# Patient Record
Sex: Female | Born: 1975
Health system: Southern US, Community
[De-identification: ages and names within clinical notes are randomized; demographics above are authoritative.]

## PROBLEM LIST (undated history)

## (undated) DIAGNOSIS — R062 Wheezing: Secondary | ICD-10-CM

## (undated) DIAGNOSIS — R05 Cough: Secondary | ICD-10-CM

## (undated) DIAGNOSIS — G510 Bell's palsy: Secondary | ICD-10-CM

## (undated) DIAGNOSIS — F329 Major depressive disorder, single episode, unspecified: Secondary | ICD-10-CM

## (undated) DIAGNOSIS — R059 Cough, unspecified: Secondary | ICD-10-CM

## (undated) DIAGNOSIS — R5383 Other fatigue: Secondary | ICD-10-CM

## (undated) DIAGNOSIS — E669 Obesity, unspecified: Secondary | ICD-10-CM

## (undated) DIAGNOSIS — M7989 Other specified soft tissue disorders: Secondary | ICD-10-CM

## (undated) DIAGNOSIS — M2550 Pain in unspecified joint: Secondary | ICD-10-CM

## (undated) DIAGNOSIS — R209 Unspecified disturbances of skin sensation: Secondary | ICD-10-CM

## (undated) DIAGNOSIS — R079 Chest pain, unspecified: Secondary | ICD-10-CM

## (undated) DIAGNOSIS — Z87898 Personal history of other specified conditions: Secondary | ICD-10-CM

## (undated) DIAGNOSIS — F32A Depression, unspecified: Secondary | ICD-10-CM

## (undated) DIAGNOSIS — G5602 Carpal tunnel syndrome, left upper limb: Secondary | ICD-10-CM

## (undated) DIAGNOSIS — F439 Reaction to severe stress, unspecified: Secondary | ICD-10-CM

## (undated) DIAGNOSIS — I10 Essential (primary) hypertension: Secondary | ICD-10-CM

## (undated) DIAGNOSIS — R0602 Shortness of breath: Secondary | ICD-10-CM

## (undated) DIAGNOSIS — E282 Polycystic ovarian syndrome: Secondary | ICD-10-CM

## (undated) DIAGNOSIS — G4733 Obstructive sleep apnea (adult) (pediatric): Secondary | ICD-10-CM

## (undated) DIAGNOSIS — F419 Anxiety disorder, unspecified: Secondary | ICD-10-CM

## (undated) DIAGNOSIS — M549 Dorsalgia, unspecified: Secondary | ICD-10-CM

## (undated) DIAGNOSIS — R682 Dry mouth, unspecified: Secondary | ICD-10-CM

## (undated) DIAGNOSIS — E0590 Thyrotoxicosis, unspecified without thyrotoxic crisis or storm: Secondary | ICD-10-CM

## (undated) DIAGNOSIS — L853 Xerosis cutis: Secondary | ICD-10-CM

## (undated) DIAGNOSIS — K219 Gastro-esophageal reflux disease without esophagitis: Secondary | ICD-10-CM

## (undated) HISTORY — DX: Obesity, unspecified: E66.9

## (undated) HISTORY — DX: Dorsalgia, unspecified: M54.9

## (undated) HISTORY — DX: Depression, unspecified: F32.A

## (undated) HISTORY — DX: Pain in unspecified joint: M25.50

## (undated) HISTORY — DX: Essential (primary) hypertension: I10

## (undated) HISTORY — DX: Reaction to severe stress, unspecified: F43.9

## (undated) HISTORY — DX: Polycystic ovarian syndrome: E28.2

## (undated) HISTORY — DX: Anxiety disorder, unspecified: F41.9

## (undated) HISTORY — DX: Other fatigue: R53.83

## (undated) HISTORY — DX: Wheezing: R06.2

## (undated) HISTORY — DX: Unspecified disturbances of skin sensation: R20.9

## (undated) HISTORY — PX: PANNICULECTOMY: SUR1001

## (undated) HISTORY — DX: Gastro-esophageal reflux disease without esophagitis: K21.9

## (undated) HISTORY — DX: Obstructive sleep apnea (adult) (pediatric): G47.33

## (undated) HISTORY — DX: Chest pain, unspecified: R07.9

## (undated) HISTORY — DX: Dry mouth, unspecified: R68.2

## (undated) HISTORY — DX: Cough, unspecified: R05.9

## (undated) HISTORY — DX: Shortness of breath: R06.02

## (undated) HISTORY — DX: Thyrotoxicosis, unspecified without thyrotoxic crisis or storm: E05.90

## (undated) HISTORY — DX: Other specified soft tissue disorders: M79.89

## (undated) HISTORY — DX: Major depressive disorder, single episode, unspecified: F32.9

## (undated) HISTORY — DX: Cough: R05

## (undated) HISTORY — DX: Xerosis cutis: L85.3

## (undated) HISTORY — DX: Bell's palsy: G51.0

---
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
## 1998-03-22 ENCOUNTER — Emergency Department (HOSPITAL_COMMUNITY): Admission: EM | Admit: 1998-03-22 | Discharge: 1998-03-22 | Payer: Self-pay | Admitting: Emergency Medicine

## 1998-10-24 ENCOUNTER — Inpatient Hospital Stay (HOSPITAL_COMMUNITY): Admission: AD | Admit: 1998-10-24 | Discharge: 1998-10-24 | Payer: Self-pay | Admitting: Obstetrics

## 1998-11-21 ENCOUNTER — Inpatient Hospital Stay (HOSPITAL_COMMUNITY): Admission: AD | Admit: 1998-11-21 | Discharge: 1998-11-21 | Payer: Self-pay | Admitting: Obstetrics & Gynecology

## 1998-11-21 ENCOUNTER — Encounter: Payer: Self-pay | Admitting: Obstetrics & Gynecology

## 1998-11-23 ENCOUNTER — Ambulatory Visit (HOSPITAL_COMMUNITY): Admission: RE | Admit: 1998-11-23 | Discharge: 1998-11-23 | Payer: Self-pay | Admitting: Obstetrics & Gynecology

## 1999-06-27 ENCOUNTER — Encounter: Payer: Self-pay | Admitting: Emergency Medicine

## 1999-06-27 ENCOUNTER — Emergency Department (HOSPITAL_COMMUNITY): Admission: EM | Admit: 1999-06-27 | Discharge: 1999-06-27 | Payer: Self-pay | Admitting: Emergency Medicine

## 1999-11-26 ENCOUNTER — Ambulatory Visit (HOSPITAL_COMMUNITY): Admission: RE | Admit: 1999-11-26 | Discharge: 1999-11-26 | Payer: Self-pay | Admitting: Neurosurgery

## 1999-11-26 ENCOUNTER — Encounter: Payer: Self-pay | Admitting: Neurosurgery

## 2000-09-09 ENCOUNTER — Other Ambulatory Visit: Admission: RE | Admit: 2000-09-09 | Discharge: 2000-09-09 | Payer: Self-pay | Admitting: Obstetrics and Gynecology

## 2000-10-14 ENCOUNTER — Encounter: Payer: Self-pay | Admitting: Neurosurgery

## 2000-10-14 ENCOUNTER — Ambulatory Visit (HOSPITAL_COMMUNITY): Admission: RE | Admit: 2000-10-14 | Discharge: 2000-10-14 | Payer: Self-pay | Admitting: Neurosurgery

## 2001-10-03 ENCOUNTER — Other Ambulatory Visit: Admission: RE | Admit: 2001-10-03 | Discharge: 2001-10-03 | Payer: Self-pay | Admitting: Obstetrics and Gynecology

## 2002-03-20 ENCOUNTER — Other Ambulatory Visit: Admission: RE | Admit: 2002-03-20 | Discharge: 2002-03-20 | Payer: Self-pay | Admitting: Obstetrics and Gynecology

## 2002-06-09 ENCOUNTER — Emergency Department (HOSPITAL_COMMUNITY): Admission: EM | Admit: 2002-06-09 | Discharge: 2002-06-09 | Payer: Self-pay | Admitting: Emergency Medicine

## 2002-09-21 ENCOUNTER — Encounter (INDEPENDENT_AMBULATORY_CARE_PROVIDER_SITE_OTHER): Payer: Self-pay | Admitting: *Deleted

## 2002-09-21 ENCOUNTER — Ambulatory Visit (HOSPITAL_BASED_OUTPATIENT_CLINIC_OR_DEPARTMENT_OTHER): Admission: RE | Admit: 2002-09-21 | Discharge: 2002-09-21 | Payer: Self-pay | Admitting: Otolaryngology

## 2002-09-21 HISTORY — PX: TONSILLECTOMY: SUR1361

## 2002-10-27 ENCOUNTER — Other Ambulatory Visit: Admission: RE | Admit: 2002-10-27 | Discharge: 2002-10-27 | Payer: Self-pay | Admitting: Obstetrics and Gynecology

## 2003-11-25 ENCOUNTER — Other Ambulatory Visit: Admission: RE | Admit: 2003-11-25 | Discharge: 2003-11-25 | Payer: Self-pay | Admitting: Obstetrics and Gynecology

## 2004-01-07 ENCOUNTER — Encounter (INDEPENDENT_AMBULATORY_CARE_PROVIDER_SITE_OTHER): Payer: Self-pay | Admitting: Specialist

## 2004-01-07 ENCOUNTER — Ambulatory Visit (HOSPITAL_COMMUNITY): Admission: RE | Admit: 2004-01-07 | Discharge: 2004-01-07 | Payer: Self-pay | Admitting: Obstetrics and Gynecology

## 2004-01-07 HISTORY — PX: LEEP: SHX91

## 2004-07-19 ENCOUNTER — Other Ambulatory Visit: Admission: RE | Admit: 2004-07-19 | Discharge: 2004-07-19 | Payer: Self-pay | Admitting: Obstetrics and Gynecology

## 2005-01-10 ENCOUNTER — Other Ambulatory Visit: Admission: RE | Admit: 2005-01-10 | Discharge: 2005-01-10 | Payer: Self-pay | Admitting: Obstetrics and Gynecology

## 2005-01-31 ENCOUNTER — Ambulatory Visit: Payer: Self-pay | Admitting: Internal Medicine

## 2005-02-16 ENCOUNTER — Ambulatory Visit: Payer: Self-pay | Admitting: Internal Medicine

## 2005-07-30 ENCOUNTER — Other Ambulatory Visit: Admission: RE | Admit: 2005-07-30 | Discharge: 2005-07-30 | Payer: Self-pay | Admitting: Obstetrics and Gynecology

## 2005-11-12 ENCOUNTER — Ambulatory Visit: Payer: Self-pay | Admitting: Internal Medicine

## 2006-02-21 ENCOUNTER — Encounter (INDEPENDENT_AMBULATORY_CARE_PROVIDER_SITE_OTHER): Payer: Self-pay | Admitting: *Deleted

## 2006-02-21 ENCOUNTER — Ambulatory Visit (HOSPITAL_COMMUNITY): Admission: RE | Admit: 2006-02-21 | Discharge: 2006-02-21 | Payer: Self-pay | Admitting: Obstetrics and Gynecology

## 2006-02-21 HISTORY — PX: HYSTEROSCOPY WITH D & C: SHX1775

## 2006-07-31 ENCOUNTER — Ambulatory Visit: Payer: Self-pay | Admitting: Internal Medicine

## 2006-10-08 ENCOUNTER — Ambulatory Visit: Payer: Self-pay | Admitting: Endocrinology

## 2006-10-08 LAB — CONVERTED CEMR LAB
ALT: 21 units/L (ref 0–40)
AST: 21 units/L (ref 0–37)
Albumin: 3.6 g/dL (ref 3.5–5.2)
Alkaline Phosphatase: 71 units/L (ref 39–117)
Amylase: 51 units/L (ref 27–131)
BUN: 5 mg/dL — ABNORMAL LOW (ref 6–23)
Bilirubin, Direct: 0.1 mg/dL (ref 0.0–0.3)
CO2: 29 meq/L (ref 19–32)
Calcium: 9.4 mg/dL (ref 8.4–10.5)
Chloride: 104 meq/L (ref 96–112)
Creatinine, Ser: 0.8 mg/dL (ref 0.4–1.2)
GFR calc non Af Amer: 91 mL/min
Glomerular Filtration Rate, Af Am: 110 mL/min/{1.73_m2}
Glucose, Bld: 83 mg/dL (ref 70–99)
HCT: 40.2 % (ref 36.0–46.0)
Hemoglobin: 13.6 g/dL (ref 12.0–15.0)
Hgb A1c MFr Bld: 5.6 % (ref 4.6–6.0)
MCHC: 33.8 g/dL (ref 30.0–36.0)
MCV: 86.4 fL (ref 78.0–100.0)
Platelets: 332 10*3/uL (ref 150–400)
Potassium: 3.4 meq/L — ABNORMAL LOW (ref 3.5–5.1)
RBC: 4.65 M/uL (ref 3.87–5.11)
RDW: 14 % (ref 11.5–14.6)
Sodium: 139 meq/L (ref 135–145)
TSH: 1.53 microintl units/mL (ref 0.35–5.50)
Total Bilirubin: 0.7 mg/dL (ref 0.3–1.2)
Total Protein: 7.4 g/dL (ref 6.0–8.3)
WBC: 7.1 10*3/uL (ref 4.5–10.5)

## 2006-10-09 ENCOUNTER — Emergency Department (HOSPITAL_COMMUNITY): Admission: EM | Admit: 2006-10-09 | Discharge: 2006-10-09 | Payer: Self-pay | Admitting: Family Medicine

## 2007-04-11 ENCOUNTER — Ambulatory Visit: Payer: Self-pay | Admitting: Internal Medicine

## 2007-09-08 ENCOUNTER — Encounter: Payer: Self-pay | Admitting: *Deleted

## 2007-09-08 DIAGNOSIS — R739 Hyperglycemia, unspecified: Secondary | ICD-10-CM

## 2007-09-08 DIAGNOSIS — I10 Essential (primary) hypertension: Secondary | ICD-10-CM

## 2008-01-26 ENCOUNTER — Emergency Department (HOSPITAL_COMMUNITY): Admission: EM | Admit: 2008-01-26 | Discharge: 2008-01-26 | Payer: Self-pay | Admitting: Emergency Medicine

## 2008-02-19 ENCOUNTER — Telehealth: Payer: Self-pay | Admitting: Internal Medicine

## 2008-04-19 ENCOUNTER — Encounter: Payer: Self-pay | Admitting: Internal Medicine

## 2008-07-07 ENCOUNTER — Ambulatory Visit: Payer: Self-pay | Admitting: Internal Medicine

## 2008-07-07 DIAGNOSIS — J45909 Unspecified asthma, uncomplicated: Secondary | ICD-10-CM | POA: Insufficient documentation

## 2008-07-07 DIAGNOSIS — N912 Amenorrhea, unspecified: Secondary | ICD-10-CM

## 2008-07-08 ENCOUNTER — Telehealth: Payer: Self-pay | Admitting: Internal Medicine

## 2008-10-05 ENCOUNTER — Ambulatory Visit: Payer: Self-pay | Admitting: Internal Medicine

## 2008-11-29 LAB — CONVERTED CEMR LAB
BUN: 6 mg/dL (ref 6–23)
CO2: 29 meq/L (ref 19–32)
Calcium: 8.8 mg/dL (ref 8.4–10.5)
Chloride: 106 meq/L (ref 96–112)
Creatinine, Ser: 0.7 mg/dL (ref 0.4–1.2)
GFR calc Af Amer: 126 mL/min
GFR calc non Af Amer: 104 mL/min
Glucose, Bld: 86 mg/dL (ref 70–99)
Hgb A1c MFr Bld: 5.7 % (ref 4.6–6.0)
Potassium: 3.7 meq/L (ref 3.5–5.1)
Sodium: 141 meq/L (ref 135–145)
TSH: 1.8 microintl units/mL (ref 0.35–5.50)

## 2009-05-12 ENCOUNTER — Encounter (INDEPENDENT_AMBULATORY_CARE_PROVIDER_SITE_OTHER): Payer: Self-pay | Admitting: *Deleted

## 2009-05-12 ENCOUNTER — Ambulatory Visit: Payer: Self-pay | Admitting: Internal Medicine

## 2009-05-12 DIAGNOSIS — R0989 Other specified symptoms and signs involving the circulatory and respiratory systems: Secondary | ICD-10-CM

## 2009-05-12 DIAGNOSIS — R079 Chest pain, unspecified: Secondary | ICD-10-CM | POA: Insufficient documentation

## 2009-05-12 DIAGNOSIS — R609 Edema, unspecified: Secondary | ICD-10-CM

## 2009-05-12 DIAGNOSIS — R0609 Other forms of dyspnea: Secondary | ICD-10-CM

## 2009-05-12 LAB — CONVERTED CEMR LAB
BUN: 8 mg/dL (ref 6–23)
Basophils Absolute: 0 10*3/uL (ref 0.0–0.1)
Basophils Relative: 0.3 % (ref 0.0–3.0)
CO2: 29 meq/L (ref 19–32)
Calcium: 9.2 mg/dL (ref 8.4–10.5)
Chloride: 104 meq/L (ref 96–112)
Creatinine, Ser: 0.6 mg/dL (ref 0.4–1.2)
Eosinophils Absolute: 0.1 10*3/uL (ref 0.0–0.7)
Eosinophils Relative: 2 % (ref 0.0–5.0)
GFR calc non Af Amer: 150.04 mL/min (ref >60)
Glucose, Bld: 78 mg/dL (ref 70–99)
HCT: 38.4 % (ref 36.0–46.0)
Hemoglobin: 13.3 g/dL (ref 12.0–15.0)
Lymphocytes Relative: 37.8 % (ref 12.0–46.0)
Lymphs Abs: 2.3 10*3/uL (ref 0.7–4.0)
MCHC: 34.6 g/dL (ref 30.0–36.0)
MCV: 85.8 fL (ref 78.0–100.0)
Monocytes Absolute: 0.4 10*3/uL (ref 0.1–1.0)
Monocytes Relative: 5.8 % (ref 3.0–12.0)
Neutro Abs: 3.3 10*3/uL (ref 1.4–7.7)
Neutrophils Relative %: 54.1 % (ref 43.0–77.0)
Platelets: 274 10*3/uL (ref 150.0–400.0)
Potassium: 3.7 meq/L (ref 3.5–5.1)
Pro B Natriuretic peptide (BNP): 22 pg/mL (ref 0.0–100.0)
RBC: 4.48 M/uL (ref 3.87–5.11)
RDW: 14.5 % (ref 11.5–14.6)
Sodium: 139 meq/L (ref 135–145)
TSH: 1.5 microintl units/mL (ref 0.35–5.50)
WBC: 6.1 10*3/uL (ref 4.5–10.5)
hCG, Beta Chain, Quant, S: <0.5 milliintl units/mL

## 2009-05-23 ENCOUNTER — Ambulatory Visit: Payer: Self-pay | Admitting: Internal Medicine

## 2009-06-06 ENCOUNTER — Telehealth: Payer: Self-pay | Admitting: Internal Medicine

## 2009-08-11 ENCOUNTER — Ambulatory Visit: Payer: Self-pay | Admitting: Internal Medicine

## 2009-08-22 ENCOUNTER — Ambulatory Visit: Payer: Self-pay | Admitting: Internal Medicine

## 2009-10-19 ENCOUNTER — Emergency Department (HOSPITAL_COMMUNITY): Admission: EM | Admit: 2009-10-19 | Discharge: 2009-10-20 | Payer: Self-pay | Admitting: Emergency Medicine

## 2009-12-06 ENCOUNTER — Ambulatory Visit: Payer: Self-pay | Admitting: Internal Medicine

## 2009-12-06 DIAGNOSIS — F329 Major depressive disorder, single episode, unspecified: Secondary | ICD-10-CM

## 2009-12-06 DIAGNOSIS — F32A Depression, unspecified: Secondary | ICD-10-CM | POA: Insufficient documentation

## 2010-05-10 ENCOUNTER — Ambulatory Visit: Payer: Self-pay | Admitting: Internal Medicine

## 2010-05-10 DIAGNOSIS — J069 Acute upper respiratory infection, unspecified: Secondary | ICD-10-CM | POA: Insufficient documentation

## 2010-05-10 DIAGNOSIS — H6690 Otitis media, unspecified, unspecified ear: Secondary | ICD-10-CM | POA: Insufficient documentation

## 2010-05-24 ENCOUNTER — Ambulatory Visit: Payer: Self-pay | Admitting: Internal Medicine

## 2010-05-24 DIAGNOSIS — R05 Cough: Secondary | ICD-10-CM

## 2010-05-25 LAB — CONVERTED CEMR LAB
Alkaline Phosphatase: 69 units/L (ref 39–117)
BUN: 6 mg/dL (ref 6–23)
Basophils Relative: 0.4 % (ref 0.0–3.0)
Bilirubin Urine: NEGATIVE
Bilirubin, Direct: 0.1 mg/dL (ref 0.0–0.3)
Calcium: 9 mg/dL (ref 8.4–10.5)
Chloride: 103 meq/L (ref 96–112)
Creatinine, Ser: 0.7 mg/dL (ref 0.4–1.2)
Eosinophils Absolute: 0.2 10*3/uL (ref 0.0–0.7)
Eosinophils Relative: 2.2 % (ref 0.0–5.0)
HDL: 54.4 mg/dL (ref >39.00)
Hgb A1c MFr Bld: 5.9 % (ref 4.6–6.5)
Ketones, ur: NEGATIVE mg/dL
Leukocytes, UA: NEGATIVE
Lymphocytes Relative: 38.3 % (ref 12.0–46.0)
MCHC: 34.3 g/dL (ref 30.0–36.0)
MCV: 87.6 fL (ref 78.0–100.0)
Monocytes Absolute: 0.4 10*3/uL (ref 0.1–1.0)
Neutrophils Relative %: 53.8 % (ref 43.0–77.0)
Platelets: 288 10*3/uL (ref 150.0–400.0)
RBC: 4.51 M/uL (ref 3.87–5.11)
TSH: 1.77 microintl units/mL (ref 0.35–5.50)
Total Bilirubin: 0.5 mg/dL (ref 0.3–1.2)
Total CHOL/HDL Ratio: 4
Triglycerides: 72 mg/dL (ref 0.0–149.0)
Urine Glucose: NEGATIVE mg/dL
Urobilinogen, UA: 0.2 (ref 0.0–1.0)
WBC: 7.1 10*3/uL (ref 4.5–10.5)

## 2010-06-30 ENCOUNTER — Telehealth: Payer: Self-pay | Admitting: Internal Medicine

## 2010-07-04 ENCOUNTER — Ambulatory Visit: Payer: Self-pay | Admitting: Internal Medicine

## 2010-07-18 ENCOUNTER — Ambulatory Visit: Payer: Self-pay | Admitting: Internal Medicine

## 2010-09-05 IMAGING — CT CT ANGIO CHEST
3 of 7 series · 19 of 36 positions shown · IV contrast (Omnipaque 300)
Comparison: None

CLINICAL DATA: Shortness of breath, chest pain.

CT ANGIOGRAPHY CHEST WITH CONTRAST
TECHNIQUE: Multidetector CT imaging of the chest was performed
using the standard protocol during bolus administration of
intravenous contrast. Multiplanar CT image reconstructions
including MIPs were obtained to evaluate the vascular anatomy.
Contrast: 80 ml Omnipaque 350.

[Series 5: pe thins · axial · 0.75mm/px · z∈[-327,-120]mm · 15 of 237 slices shown]
[im 15/237  lung]
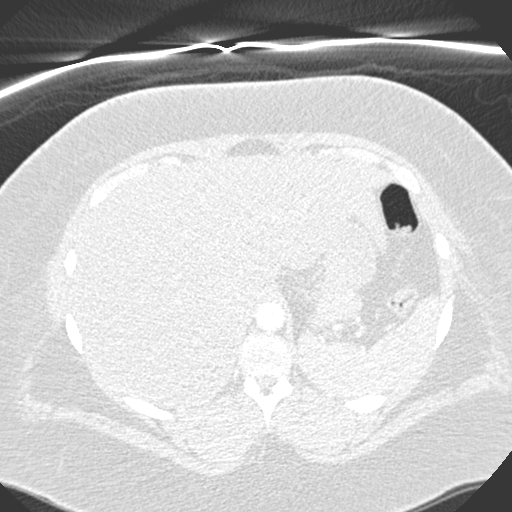
[im 30/237  mediastinal]
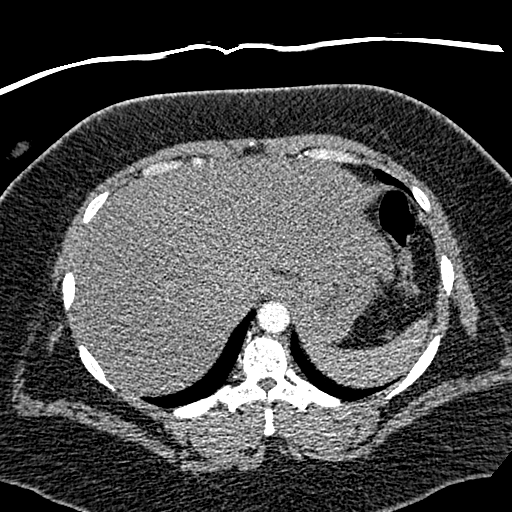
[im 45/237  lung]
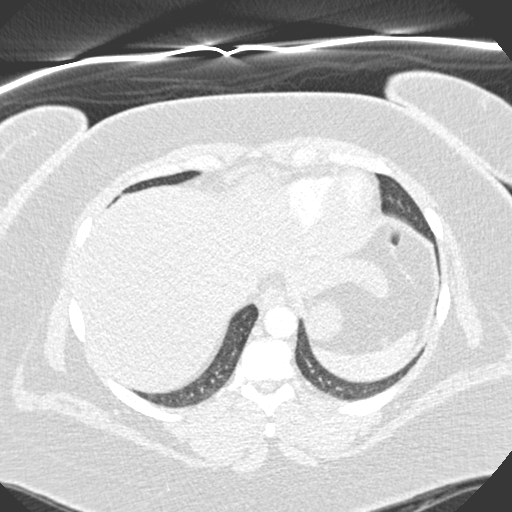
[im 60/237  mediastinal]
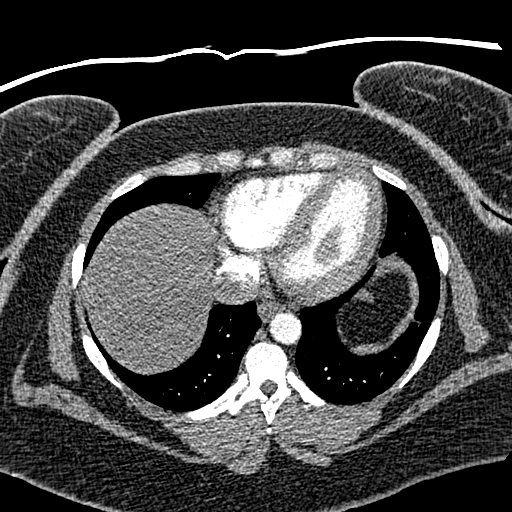
[im 74/237  lung]
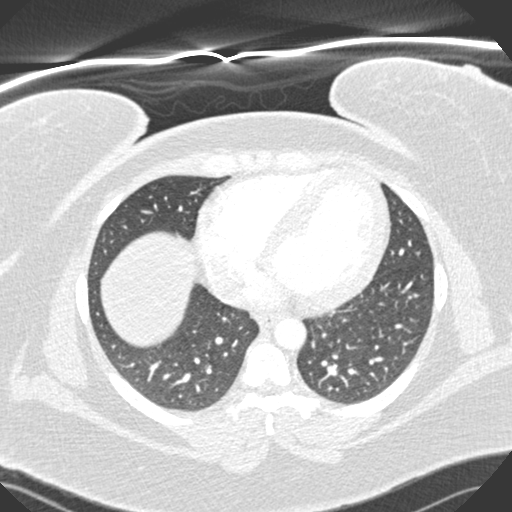
[im 89/237  mediastinal]
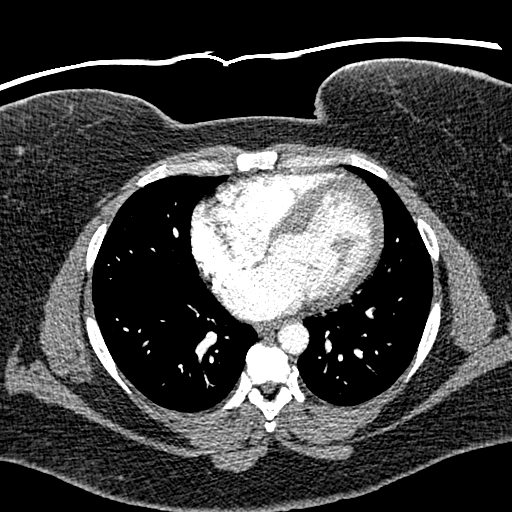
[im 104/237  lung]
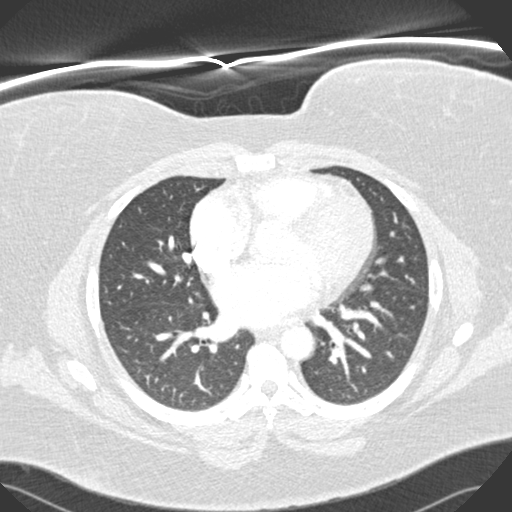
[im 119/237  mediastinal]
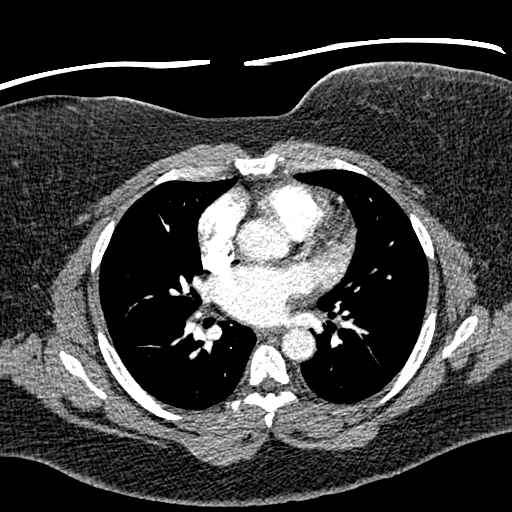
[im 133/237  lung]
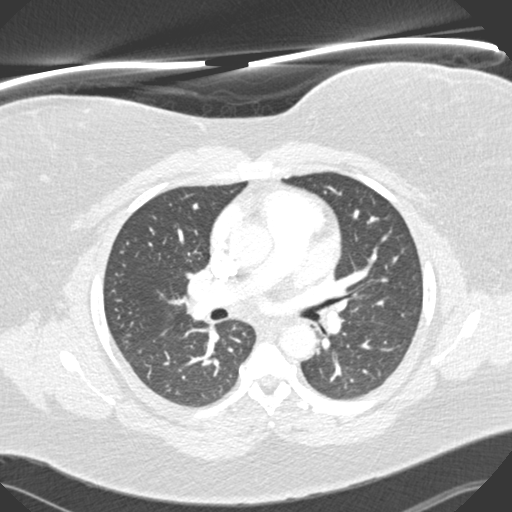
[im 148/237  mediastinal]
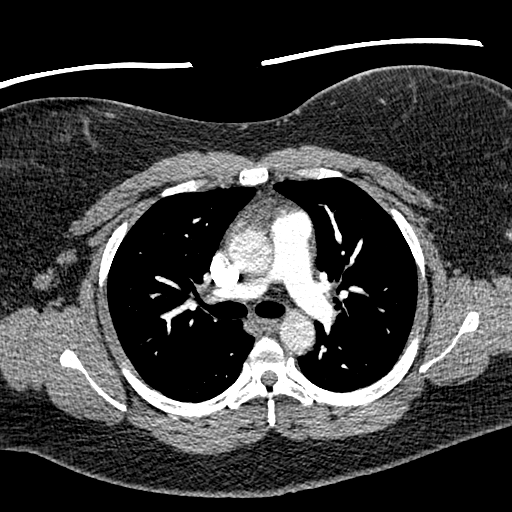
[im 163/237  lung]
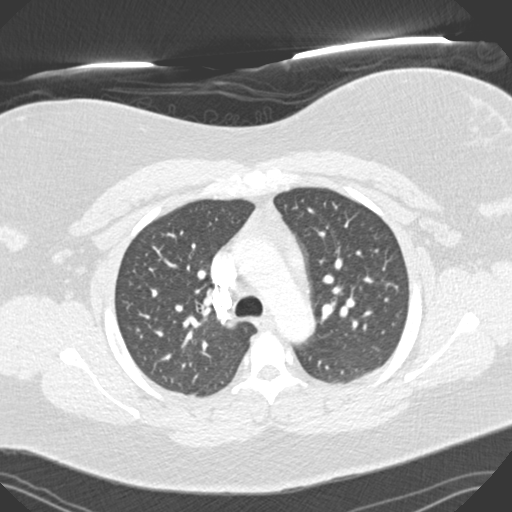
[im 178/237  mediastinal]
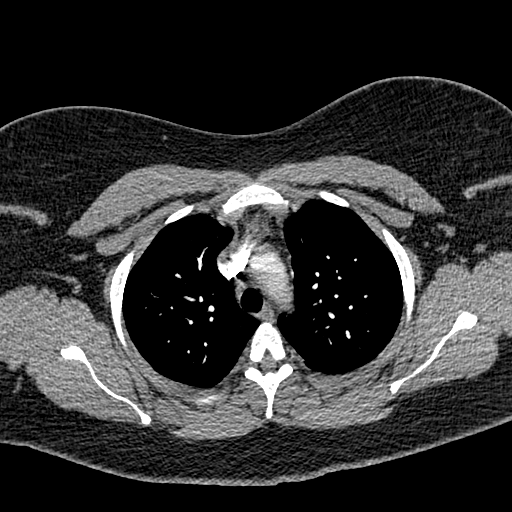
[im 192/237  lung]
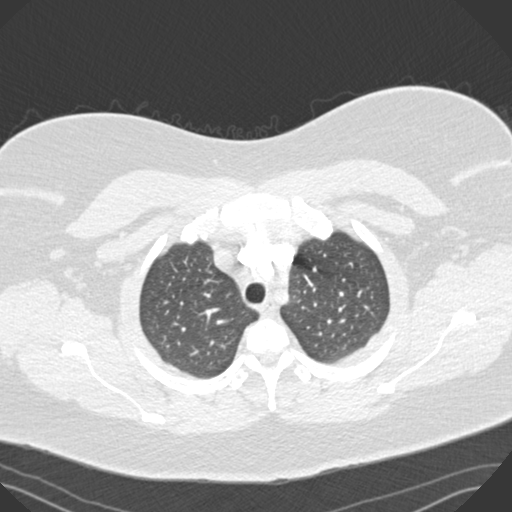
[im 207/237  mediastinal]
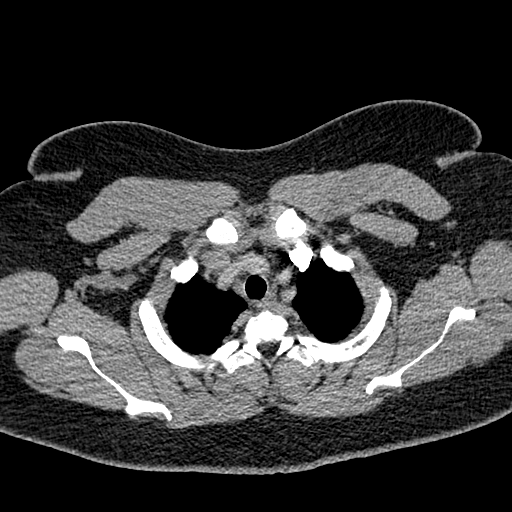
[im 222/237  lung]
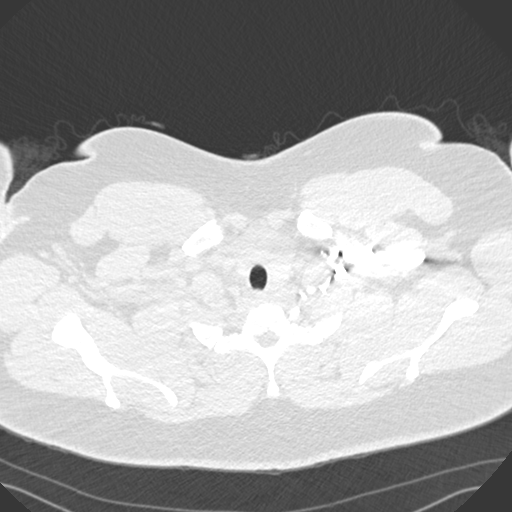

[Series 6: lung · axial · 0.75mm/px · z∈[-280,-166]mm · 3 of 76 slices shown]
[im 19/76  mediastinal]
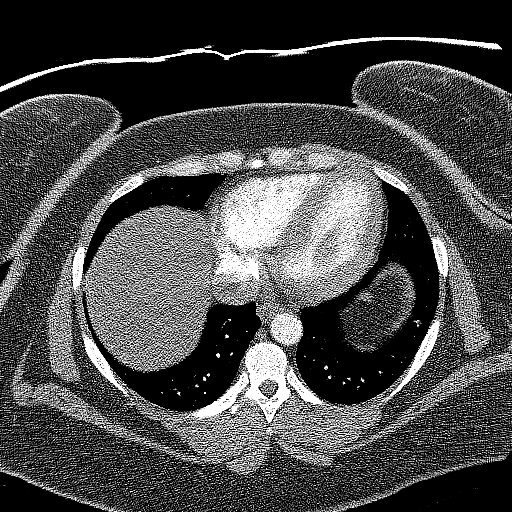
[im 38/76  mediastinal]
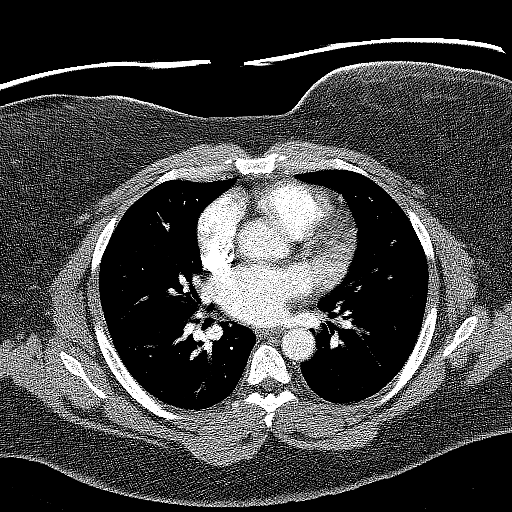
[im 57/76  mediastinal]
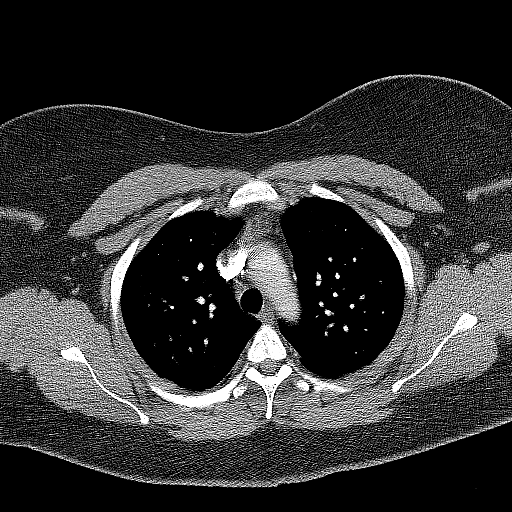

[Series 602: <mpr thick range> · coronal · 0.75mm/px · 1 of 115 slices shown]
[im 58/115  mediastinal]
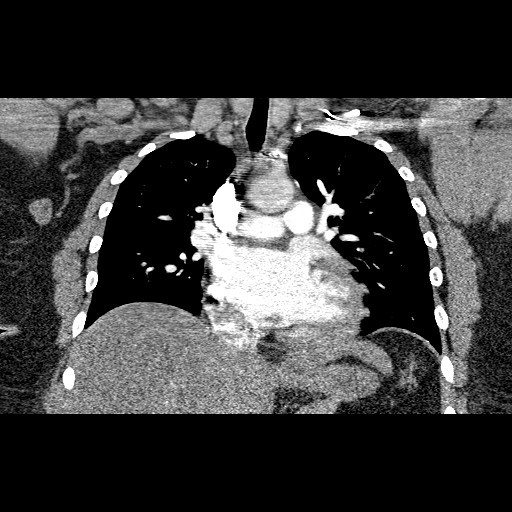

[19 of 36 positions shown; findings below may reference images not displayed]

FINDINGS: No filling defects in the pulmonary arteries to suggest
pulmonary emboli.  Heart is borderline enlarged.  Aorta is normal
caliber.  No evidence of dissection. No mediastinal, hilar, or
axillary adenopathy.  Visualized thyroid and chest wall soft
tissues unremarkable. Imaging into the upper abdomen shows no acute
findings.

Lungs are clear.  No focal airspace opacities or suspicious
nodules.  No effusions. No acute bony abnormality.

Review of the MIP images confirms the above findings.
IMPRESSION: No evidence of pulmonary embolus.  No acute findings.

Borderline cardiomegaly.

## 2010-09-20 ENCOUNTER — Ambulatory Visit: Payer: Self-pay | Admitting: Internal Medicine

## 2010-09-20 DIAGNOSIS — K219 Gastro-esophageal reflux disease without esophagitis: Secondary | ICD-10-CM | POA: Insufficient documentation

## 2010-09-22 ENCOUNTER — Telehealth: Payer: Self-pay | Admitting: Internal Medicine

## 2010-12-31 ENCOUNTER — Encounter: Payer: Self-pay | Admitting: Neurosurgery

## 2011-01-09 NOTE — Assessment & Plan Note (Signed)
Summary: Pulmonary/ ext summary f/u ov cough 100% resolved   Copy to:  Dr. Posey Rea Primary Provider/Referring Provider:  Tresa Garter MD  CC:  2 wk followup.  Pt states that her cough has resolved.  She denies any complaints todayu. Kim Barnes  History of Present Illness: 75 yobf dx with asthma as child but had rare attacks couldn't breath controlled with as needed saba on shots until age [age] per Dr Maple Hudson  July 04, 2010 since age 43 problems recurrent ? maybe worse in spring coughs so hard looses breath and sometimes incontinence but never vomiting and sleeps ok without cough.  rec  stop symbicort,  singulair and tribenzor Proaire if needed for short of breath Bystolic 20 daily Continue Take  one 30-60 min before first meal of the day Pepcid ac 20 mg one bedtime For cough, add tramadol 50 mg one every 4 hours as need  July 18, 2010  cc  cough has resolved.  She denies any complaints today.  No cough since 8/4 no need for tramadol.  Pt denies any significant sore throat, dysphagia, itching, sneezing,  nasal congestion or excess secretions,  fever, chills, sweats, unintended wt loss, pleuritic or exertional cp, hempoptysis, change in activity tolerance  orthopnea pnd or leg swelling Pt also denies any obvious fluctuation in symptoms with weather or environmental change or other alleviating or aggravating factors.       Current Medications (verified): 1)  Proair Hfa 108 (90 Base) Mcg/act  Aers (Albuterol Sulfate) .... 2 Inh Q4h As Needed Shortness of Breath 2)  Vitamin D3 1000 Unit  Tabs (Cholecalciferol) .Kim Barnes.. 1 By Mouth Daily 3)  Furosemide 40 Mg Tabs (Furosemide) .... Take Two Tablets Daily 4)  Potassium Chloride Cr 10 Meq  Tbcr (Potassium Chloride) .Kim Barnes.. 1 By Mouth Qd 5)  Nexium 40 Mg Cpdr (Esomeprazole Magnesium) .... Take  One 30-60 Min Before First Meal of The Day 6)  Bystolic 20 Mg Tabs (Nebivolol Hcl) .... One Daily 7)  Tramadol Hcl 50 Mg  Tabs (Tramadol Hcl) .... One To Two By  Mouth Every 4-6 Hours If Needed For Cough  Allergies (verified): 1)  ! Keflex  Past History:  Past Medical History: Hypertension Obesity Amenorrhea   Dr Marcelle Overlie Asthma Depression Chronic cough     - onset 04/2010 > resolved July 18, 2010 on max gerd rx and off ICS/ on bystolic  Vital Signs:  Patient profile:   35 year old female Weight:      376.13 pounds O2 Sat:      97 % on Room air Temp:     98.4 degrees F oral Pulse rate:   87 / minute BP sitting:   120 / 80  (left arm) Cuff size:   large  Vitals Entered ByVernie Murders (July 18, 2010 9:14 AM)  O2 Flow:  Room air  Physical Exam  Additional Exam:  obese amb  bf all smiles  wt 371> 378 July 04, 2010  > 376 July 18, 2010  HEENT: nl dentition, turbinates, and orophanx. Nl external ear canals without cough reflex NECK :  without JVD/Nodes/TM/ nl carotid upstrokes bilaterally LUNGS: no acc muscle use, clear to A and P bilaterally without cough on insp or exp maneuvers CV:  RRR  no s3 or murmur or increase in P2, no edema  ABD:  soft and nontender with nl excursion in the supine position. No bruits or organomegaly, bowel sounds nl MS:  warm without deformities,  calf tenderness, cyanosis or clubbing        Impression & Recommendations:  Problem # 1:  COUGH (ICD-786.2)  The most common causes of chronic cough in immunocompetent adults include: upper airway cough syndrome (UACS), previously referred to as postnasal drip syndrome,  caused by variety of rhinosinus conditions; (2) asthma; (3) GERD; (4) chronic bronchitis from cigarette smoking or other inhaled environmental irritants; (5) nonasthmatic eosinophilic bronchitis; and (6) bronchiectasis. These conditions, singly or in combination, have accounted for up to 94% of the causes of chronic cough in prospective studies.  ? if this is airway cough syndrome, so named because it's frequently impossible to sort out how much is  CR/sinusitis with freq throat clearing  (which can be related to primary GERD)   vs  causing  secondary extra esophageal GERD from wide swings in gastric pressure that occur with throat clearing, promoting self use of mint and menthol lozenges that reduce the lower esophageal sphincter tone and exacerbate the problem further These are the same pts who not infrequently have failed to tolerate ace inhibitors,  dry powder inhalers or biphosphonates or report having reflux symptoms that don't respond to standard doses of PPI  For now max rx for GERD and  avoid any bp med that can cause cough (see #2)  NB the  ramp to expected improvement (and for that matter, worsening, if a chronic effective medication is stopped)  can be measured in weeks, not days, a common misconception because this is not Heartburn with no immediate cause and effect relationship so that response to therapy or lack thereof can be very difficult to assess.  So maintain rx x 6 more weeks  Orders: Est. Patient Level IV (18299)  Problem # 2:  HYPERTENSION (ICD-401.9)     Stongly prefer here Bystolic, the most beta -1  selective Beta blocker available in sample form, with bisoprolol the most selective generic choice  on the market.   CCB may cause cough by interfering with Es sphincter and doing fine on just bystolic so continue rx  Orders: Est. Patient Level IV (37169)  Problem # 3:  ASTHMA (ICD-493.90) All goals of asthma met including optimal function and elimination of symptoms with minimum need for rescue therapy. Contingencies discussed today including the rule of two's.   No need for maint asthma rx at present  Each maintenance medication was reviewed in detail including most importantly the difference between maintenance prns and under what circumstances the prns are to be used.  In addition, these two groups (for which the patient should keep up with refills) were distinguished from a third group :  meds that are used only short term with the intent to complete  a course of therapy and then not refill them.  The med list was then fully reconciled and reorganized to reflect this important distinction.   Medications Added to Medication List This Visit: 1)  Pepcid Ac Maximum Strength 20 Mg Tabs (Famotidine) .... One at bedtime  Patient Instructions: 1)  No change in treatment for next 6 weeks 2)  Please schedule a follow-up appointment in 6 weeks, sooner if needed  3)  call me if insurance doesn't cover your medication with alternative  Prescriptions: BYSTOLIC 20 MG TABS (NEBIVOLOL HCL) one daily  #34 x 11   Entered and Authorized by:   Nyoka Cowden MD   Signed by:   Nyoka Cowden MD on 07/18/2010   Method used:   Electronically to  [facility]  [phone]* (retail)       [address], [place]  [address]       Ph: [phone] or [phone]       Fax: [phone]   RxID:   [id number] NEXIUM 40 MG CPDR (ESOMEPRAZOLE MAGNESIUM) Take  one 30-60 min before first meal of the day  #34 x 3   Entered and Authorized by:   Nyoka Cowden MD   Signed by:   Nyoka Cowden MD on 07/18/2010   Method used:   Electronically to        [facility]  [phone]* (retail)       [address] [place], [place]  [address]       Ph: [phone] or [phone]       Fax: [phone]   RxID:   [id number]

## 2011-01-09 NOTE — Assessment & Plan Note (Signed)
Summary: asthma problem,offered sda,pt said no/cd   Vital Signs:  Patient profile:   35 year old female Height:      66 inches Weight:      372.25 pounds BMI:     60.30 O2 Sat:      95 % on Room air Temp:     99.0 degrees F oral Pulse rate:   87 / minute  Vitals Entered By: Lucious Groves (May 10, 2010 4:03 PM)  O2 Flow:  Room air CC: C/o asthma and breathing problems x4 days. /kb Is Patient Diabetic? No Pain Assessment Patient in pain? no      Comments Patient notes that she does have sore throat and cough, but denies fever and mucous production. Pt also notes that she is not taking Tribenzor or Citalopram./kb   CC:  C/o asthma and breathing problems x4 days. /kb.  History of Present Illness: C/o cough since Sunday  Current Medications (verified): 1)  Qvar 40 Mcg/act  Aers (Beclomethasone Dipropionate) .... 2 Inh Qd 2)  Proair Hfa 108 (90 Base) Mcg/act  Aers (Albuterol Sulfate) .... 2 Inh Q4h As Needed Shortness of Breath 3)  Singulair 10 Mg Tabs (Montelukast Sodium) .[name].. 1 By Mouth Daily 4)  Vitamin D3 1000 Unit  Tabs (Cholecalciferol) .[name].. 1 By Mouth Daily 5)  Bystolic 20 Mg Tabs (Nebivolol Hcl) .[name].. 1 By Mouth Qd 6)  Citalopram Hydrobromide 10 Mg  Tabs (Citalopram Hydrobromide) .... Take 1 Tab By Mouth Daily 7)  Tribenzor 40/5/25 Mg .... Take 1 Tab By Mouth Daily 8)  Furosemide 40 Mg Tabs (Furosemide) .... Take Two Tablets Daily 9)  Potassium Chloride Cr 10 Meq  Tbcr (Potassium Chloride) .[name].. 1 By Mouth Qd  Allergies (verified): 1)  ! Keflex  Past History:  Past Medical History: Last updated: 12/06/2009 Hypertension Obesity Amenorrhea   Dr Marcelle Overlie Asthma Depression  Social History: Last updated: 07/07/2008 Single Never Smoked  Past Surgical History: Denies surgical history  Physical Exam  General:  alert and overweight-appearing.   Ears:  B TMs red Mouth:  eryth Lungs:  normal respiratory effort and normal breath sounds.   Heart:  normal rate and  regular rhythm.   Abdomen:  soft, non-tender, and normal bowel sounds.     Impression & Recommendations:  Problem # 1:  ASTHMA (ICD-493.90)  Assessment Deteriorated  Her updated medication list for this problem includes:    Qvar 40 Mcg/act Aers (Beclomethasone dipropionate) .[name]... 2 inh qd    Proair Hfa 108 (90 Base) Mcg/act Aers (Albuterol sulfate) .[name]... 2 inh q4h as needed shortness of breath    Singulair 10 Mg Tabs (Montelukast sodium) .[name]... 1 by mouth daily    Symbicort 160-4.5 Mcg/act Aero (Budesonide-formoterol fumarate) .[name]... 2 inh two times a day    Prednisone 10 Mg Tabs (Prednisone) .[name]... Take 40mg  qd for 3 days, then 20 mg qd for 3 days, then 10mg  qd for 6 days, then stop. take pc.  Problem # 2:  UPPER RESPIRATORY INFECTION, ACUTE (ICD-465.9) Assessment: New Zpac Her updated medication list for this problem includes:    Promethazine-codeine 6.25-10 Mg/64ml Syrp (Promethazine-codeine) .[name]... 5-10 ml by mouth q id as needed cough  Problem # 3:  OTITIS MEDIA (ICD-382.9) B Assessment: New  Her updated medication list for this problem includes:    Zithromax Z-pak 250 Mg Tabs (Azithromycin) .[name]... As dirrected  Complete Medication List: 1)  Qvar 40 Mcg/act Aers (Beclomethasone dipropionate) .... 2 inh qd 2)  Proair Hfa 108 (90  Base) Mcg/act Aers (Albuterol sulfate) .... 2 inh q4h as needed shortness of breath 3)  Singulair 10 Mg Tabs (Montelukast sodium) .[name].. 1 by mouth daily 4)  Vitamin D3 1000 Unit Tabs (Cholecalciferol) .[name].. 1 by mouth daily 5)  Bystolic 20 Mg Tabs (Nebivolol hcl) .[name].. 1 by mouth qd 6)  Citalopram Hydrobromide 10 Mg Tabs (Citalopram hydrobromide) .... Take 1 tab by mouth daily 7)  Tribenzor 40/5/25 Mg  .... Take 1 tab by mouth daily 8)  Furosemide 40 Mg Tabs (Furosemide) .... Take two tablets daily 9)  Potassium Chloride Cr 10 Meq Tbcr (Potassium chloride) .[name].. 1 by mouth qd 10)  Symbicort 160-4.5 Mcg/act Aero (Budesonide-formoterol fumarate) .... 2 inh two  times a day 11)  Zithromax Z-pak 250 Mg Tabs (Azithromycin) .... As dirrected 12)  Promethazine-codeine 6.25-10 Mg/20ml Syrp (Promethazine-codeine) .... 5-10 ml by mouth q id as needed cough 13)  Sudafed 12 Hour 120 Mg Xr12h-tab (Pseudoephedrine hcl) .[name].. 1 by mouth two times a day as needed for congestion 14)  Prednisone 10 Mg Tabs (Prednisone) .... Take 40mg  qd for 3 days, then 20 mg qd for 3 days, then 10mg  qd for 6 days, then stop. take pc.  Patient Instructions: 1)  Use over-the-counter medicines for "cold": Tylenol  650mg  or Advil 400mg  every 6 hours  for fever; Delsym or Robutussin for cough. Mucinex or Mucinex D for congestion. Ricola or Halls for sore throat. Office visit if not better or if worse.  Prescriptions: PREDNISONE 10 MG TABS (PREDNISONE) Take 40mg  qd for 3 days, then 20 mg qd for 3 days, then 10mg  qd for 6 days, then stop. Take pc.  #24 x 1   Entered and Authorized by:   Tresa Garter MD   Signed by:   Tresa Garter MD on 05/10/2010   Method used:   Print then Give to Patient   RxID:   [id number] SUDAFED 12 HOUR 120 MG XR12H-TAB (PSEUDOEPHEDRINE HCL) 1 by mouth two times a day as needed for congestion  #60 x 0   Entered and Authorized by:   Tresa Garter MD   Signed by:   Tresa Garter MD on 05/10/2010   Method used:   Print then Give to Patient   RxID:   [id number] PROMETHAZINE-CODEINE 6.25-10 MG/5ML SYRP (PROMETHAZINE-CODEINE) 5-10 ml by mouth q id as needed cough  #300 ml x 0   Entered and Authorized by:   Tresa Garter MD   Signed by:   Tresa Garter MD on 05/10/2010   Method used:   Print then Give to Patient   RxID:   [id number] Z-PAK 250 MG TABS (AZITHROMYCIN) as dirrected  #1 x 0   Entered and Authorized by:   Tresa Garter MD   Signed by:   Tresa Garter MD on 05/10/2010   Method used:   Print then Give to Patient   RxID:   [id number] SYMBICORT 160-4.5 MCG/ACT AERO  (BUDESONIDE-FORMOTEROL FUMARATE) 2 inh two times a day  #1 x 3   Entered and Authorized by:   Tresa Garter MD   Signed by:   Tresa Garter MD on 05/10/2010   Method used:   Print then Give to Patient   RxID:   [id number]

## 2011-01-09 NOTE — Progress Notes (Signed)
Summary: nos appt  Phone Note Call from Patient   Caller: [email or web address]  Call For: Mauri Tolen Summary of Call: LMTCB to rsc nos from 10/13. Initial call taken by: Darletta Moll,  September 22, 2010 2:25 PM

## 2011-01-09 NOTE — Assessment & Plan Note (Signed)
Summary: Pulmonary consultation/ cough   Visit Type:  Initial Consult Copy to:  Dr. Posey Rea Primary Provider/Referring Provider:  Tresa Garter MD  CC:  Cough.  History of Present Illness: 47 yobf dx with asthma as child but had rare attacks couldn't breath controlled with as needed saba.  July 04, 2010 since age 35 problems recurrent ? maybe worse in spring coughs so hard looses breath and sometimes incontinence but never vomiting and sleeps ok without cough.  Pt denies any significant sore throat, dysphagia, itching, sneezing,  nasal congestion or excess secretions,  fever, chills, sweats, unintended wt loss, pleuritic or exertional cp, hempoptysis, change in activity tolerance  orthopnea pnd or leg swelling. Pt also denies any obvious fluctuation in symptoms with weather or environmental change or other alleviating or aggravating factors.   only sob when coughing     Current Medications (verified): 1)  Tribenzor 40-10-25 Mg Tabs (Olmesartan-Amlodipine-Hctz) .[name].. 1 By Mouth Once Daily For Blood Pressure 2)  Qvar 40 Mcg/act  Aers (Beclomethasone Dipropionate) .... 2 Inh Qd 3)  Proair Hfa 108 (90 Base) Mcg/act  Aers (Albuterol Sulfate) .... 2 Inh Q4h As Needed Shortness of Breath 4)  Singulair 10 Mg Tabs (Montelukast Sodium) .[name].. 1 By Mouth Daily 5)  Vitamin D3 1000 Unit  Tabs (Cholecalciferol) .[name].. 1 By Mouth Daily 6)  Furosemide 40 Mg Tabs (Furosemide) .... Take Two Tablets Daily 7)  Potassium Chloride Cr 10 Meq  Tbcr (Potassium Chloride) .[name].. 1 By Mouth Qd 8)  Symbicort 160-4.5 Mcg/act Aero (Budesonide-Formoterol Fumarate) .... 2 Inh Two Times A Day 9)  Promethazine-Codeine 6.25-10 Mg/82ml Syrp (Promethazine-Codeine) .... 5-10 Ml By Mouth Q Id As Needed Cough 10)  Sudafed 12 Hour 120 Mg Xr12h-Tab (Pseudoephedrine Hcl) .[name].. 1 By Mouth Two Times A Day As Needed For Congestion 11)  Prednisone 10 Mg Tabs (Prednisone) .... Take 40mg  Qd For 3 Days, Then 20 Mg Qd For 3 Days, Then 10mg   Qd For 6 Days, Then Stop. Take Pc. 12)  Nexium 40 Mg Cpdr (Esomeprazole Magnesium) .[name].. 1 By Mouth Qam ( Medically Necessary)  Allergies (verified): 1)  ! Keflex  Past History:  Past Medical History: Hypertension Obesity Amenorrhea   Dr Marcelle Overlie Asthma Depression Chronic cough     - onset 04/2010  Past Surgical History: Brain surgery in 30' - had tumor removed  Family History: Family History Hypertension Lung CA- Father- was a smoker Breast CA- MGM Emphysema- PGM Asthma- Mother and Maternal Aunt  Social History: Single Never Smoked Patient account Rep ETOH occ  Review of Systems       The patient complains of shortness of breath with activity, shortness of breath at rest, productive cough, and non-productive cough.  The patient denies coughing up blood, chest pain, irregular heartbeats, acid heartburn, indigestion, loss of appetite, weight change, abdominal pain, difficulty swallowing, sore throat, tooth/dental problems, headaches, nasal congestion/difficulty breathing through nose, sneezing, itching, ear ache, anxiety, depression, hand/feet swelling, joint stiffness or pain, rash, change in color of mucus, and fever.    Vital Signs:  Patient profile:   35 year old female Weight:      378.25 pounds O2 Sat:      98 % on Room air Temp:     98.4 degrees F oral Pulse rate:   80 / minute BP sitting:   112 / 64  (left arm) Cuff size:   large  Vitals Entered By: Vernie Murders (July 04, 2010 10:10 AM)  O2 Flow:  Room air  Physical Exam  Additional Exam:  obese amb  bf wt 371> 378 July 04, 2010  HEENT: nl dentition, turbinates, and orophanx. Nl external ear canals without cough reflex NECK :  without JVD/Nodes/TM/ nl carotid upstrokes bilaterally LUNGS: no acc muscle use, clear to A and P bilaterally without cough on insp or exp maneuvers CV:  RRR  no s3 or murmur or increase in P2, no edema  ABD:  soft and nontender with nl excursion in the supine position. No bruits  or organomegaly, bowel sounds nl MS:  warm without deformities, calf tenderness, cyanosis or clubbing SKIN: warm and dry without lesions   NEURO:  alert, approp, no deficits      Impression & Recommendations:  Problem # 1:  COUGH (ICD-786.2) The most common causes of chronic cough in immunocompetent adults include: upper airway cough syndrome (UACS), previously referred to as postnasal drip syndrome,  caused by variety of rhinosinus conditions; (2) asthma; (3) GERD; (4) chronic bronchitis from cigarette smoking or other inhaled environmental irritants; (5) nonasthmatic eosinophilic bronchitis; and (6) bronchiectasis. These conditions, singly or in combination, have accounted for up to 94% of the causes of chronic cough in prospective studies.  This is  Classic Upper airway cough syndrome, so named because it's frequently impossible to sort out how much is  CR/sinusitis with freq throat clearing (which can be related to primary GERD)   vs  causing  secondary extra esophageal GERD from wide swings in gastric pressure that occur with throat clearing, promoting self use of mint and menthol lozenges that reduce the lower esophageal sphincter tone and exacerbate the problem further These are the same pts who not infrequently have failed to tolerate ace inhibitors,  dry powder inhalers or biphosphonates or report having reflux symptoms that don't respond to standard doses of PPI  For now max rx for GERD and suppress cyclical cough./ See instructions for specific recommendations   The standardized cough guidelines recently published in Chest are a 14 step process, not a single office visit,  and are intended  to address this problem logically,  with an alogrithm dependent on response to each progressive step  to determine a specific diagnosis with  minimal addtional testing needed. Therefore if compliance is an issue this empiric standardized approach simply won't work.   NB the  ramp to expected  improvement (and for that matter, worsening, if a chronic effective medication is stopped)  can be measured in weeks, not days, a common misconception because this is not Heartburn with no immediate cause and effect relationship so that response to therapy or lack thereof can be very difficult to assess.   Problem # 2:  HYPERTENSION (ICD-401.9)  Stongly prefer here Bystolic, the most beta -1  selective Beta blocker available in sample form, with bisoprolol the most selective generic choice  on the market.   CCB may cause cough by interfering with Es sphincter  Orders: Consultation Level V (16109)  Medications Added to Medication List This Visit: 1)  Nexium 40 Mg Cpdr (Esomeprazole magnesium) .... Take  one 30-60 min before first meal of the day 2)  Nexium 40 Mg Cpdr (Esomeprazole magnesium) .[name].. 1 by mouth qam ( medically necessary) 3)  Bystolic 20 Mg Tabs (Nebivolol hcl) .... One daily 4)  Tramadol Hcl 50 Mg Tabs (Tramadol hcl) .... One to two by mouth every 4-6 hours if needed for cough  Patient Instructions: 1)  stop symbicort,  singulair and tribenzor 2)  Proaire if needed for  short of breath 3)  Bystolic 20 daily 4)  Continue Take  one 30-60 min before first meal of the day 5)  Pepcid ac 20 mg one bedtime 6)  For cough, add tramadol 50 mg one every 4 hours as need 7)  GERD (REFLUX)  is a common cause of respiratory symptoms. It commonly presents without heartburn and can be treated with medication, but also with lifestyle changes including avoidance of late meals, excessive alcohol, smoking cessation, and avoid fatty foods, chocolate, peppermint, colas, red wine, and acidic juices such as orange juice. NO MINT OR MENTHOL PRODUCTS SO NO COUGH DROPS  8)  USE SUGARLESS CANDY INSTEAD (jolley ranchers)  9)  NO OIL BASED VITAMINS  (no vit d for now if in oil) 10)  Please schedule a follow-up appointment in 2 weeks, sooner if needed  Prescriptions: TRAMADOL HCL 50 MG  TABS (TRAMADOL HCL) One  to two by mouth every 4-6 hours if needed for cough  #40 x 0   Entered and Authorized by:   Nyoka Cowden MD   Signed by:   Nyoka Cowden MD on 07/04/2010   Method used:   Electronically to        [facility]  [phone]* (retail)       [address] [place], [place]  [address]       Ph: [phone] or [phone]       Fax: [phone]   RxID:   [id number] BYSTOLIC 20 MG TABS (NEBIVOLOL HCL) one daily  #90 x 3   Entered and Authorized by:   Nyoka Cowden MD   Signed by:   Nyoka Cowden MD on 07/04/2010   Method used:   Print then Mail to Patient   RxID:   [id number]

## 2011-01-09 NOTE — Assessment & Plan Note (Signed)
Summary: 4 MO ROV /NWS  #   Vital Signs:  Patient profile:   35 year old female Height:      66 inches Weight:      376 pounds BMI:     60.91 Temp:     98.9 degrees F oral Pulse rate:   88 / minute Pulse rhythm:   regular Resp:     16 per minute BP sitting:   130 / 90  (left arm) Cuff size:   large  Vitals Entered By: Lanier Prude, CMA(AAMA) (September 20, 2010 8:19 AM) CC: 4 mo f/u Is Patient Diabetic? No   Primary Care Juni Glaab:  Tresa Garter MD  CC:  4 mo f/u.  History of Present Illness: F/u HTN, cough - better, GERD, obesity, asthma  Current Medications (verified): 1)  Vitamin D3 1000 Unit  Tabs (Cholecalciferol) .[name].. 1 By Mouth Daily 2)  Furosemide 40 Mg Tabs (Furosemide) .... Take Two Tablets Daily 3)  Potassium Chloride Cr 10 Meq  Tbcr (Potassium Chloride) .[name].. 1 By Mouth Qd 4)  Nexium 40 Mg Cpdr (Esomeprazole Magnesium) .... Take  One 30-60 Min Before First Meal of The Day 5)  Pepcid Ac Maximum Strength 20 Mg Tabs (Famotidine) .... One At Bedtime 6)  Bystolic 20 Mg Tabs (Nebivolol Hcl) .... One Daily 7)  Tramadol Hcl 50 Mg  Tabs (Tramadol Hcl) .... One To Two By Mouth Every 4-6 Hours If Needed For Cough  Allergies (verified): 1)  ! Keflex  Past History:  Past Surgical History: Last updated: 07/04/2010 Brain surgery in 6' - had tumor removed  Family History: Last updated: 07/04/2010 Family History Hypertension Lung CA- Father- was a smoker Breast CA- MGM Emphysema- PGM Asthma- Mother and Maternal Aunt  Social History: Last updated: 07/04/2010 Single Never Smoked Patient account Rep ETOH occ  Past Medical History: Hypertension Obesity Amenorrhea   Dr Marcelle Overlie Asthma Depression Chronic cough     - onset 04/2010 > resolved July 18, 2010 on max gerd rx and off ICS/ on bystolic GERD  Review of Systems  The patient denies fever, weight loss, weight gain, dyspnea on exertion, and abdominal pain.         much less cough  Physical  Exam  General:  alert and overweight-appearing.   Ears:  B TMs WNL Nose:  no external deformity and no nasal discharge.   Mouth:  WNL Neck:  supple and no masses.   Lungs:  normal respiratory effort and normal breath sounds.   Heart:  normal rate and regular rhythm.   Abdomen:  soft, non-tender, and normal bowel sounds.   Msk:  WNL Extremities:  no  edema B Neurologic:  No cranial nerve deficits noted. Station and gait are normal. Plantar reflexes are down-going bilaterally. DTRs are symmetrical throughout. Sensory, motor and coordinative functions appear intact.   Impression & Recommendations:  Problem # 1:  COUGH (ICD-786.2) Assessment Improved F/u w/Dr Sherene Sires is pending   Problem # 2:  ASTHMA (ICD-493.90) Assessment: Improved On the regimen of medicine(s) reflected in the chart   Flu shot at work is pending   Problem # 3:  HYPERTENSION (ICD-401.9) Assessment: Unchanged  Her updated medication list for this problem includes:    Furosemide 40 Mg Tabs (Furosemide) .[name]... Take two tablets daily    Bystolic 20 Mg Tabs (Nebivolol hcl) ..... One daily  BP today: 130/90 Prior BP: 120/80 (07/18/2010)  Labs Reviewed: K+: 3.8 (05/24/2010) Creat: : 0.7 (05/24/2010)   Chol: 225 (05/24/2010)  HDL: 54.40 (05/24/2010)   TG: 72.0 (05/24/2010)  Problem # 5:  GERD (ICD-530.81) Assessment: Improved  Her updated medication list for this problem includes:    Nexium 40 Mg Cpdr (Esomeprazole magnesium) .[name]... Take  one 30-60 min before first meal of the day    Pepcid Ac Maximum Strength 20 Mg Tabs (Famotidine) ..... One at bedtime  Complete Medication List: 1)  Vitamin D3 1000 Unit Tabs (Cholecalciferol) .[name].. 1 by mouth daily 2)  Furosemide 40 Mg Tabs (Furosemide) .... Take two tablets daily 3)  Potassium Chloride Cr 10 Meq Tbcr (Potassium chloride) .[name].. 1 by mouth qd 4)  Nexium 40 Mg Cpdr (Esomeprazole magnesium) .... Take  one 30-60 min before first meal of the day 5)  Pepcid Ac Maximum  Strength 20 Mg Tabs (Famotidine) .... One at bedtime 6)  Bystolic 20 Mg Tabs (Nebivolol hcl) .... One daily 7)  Tramadol Hcl 50 Mg Tabs (Tramadol hcl) .... One to two by mouth every 4-6 hours if needed for cough  Patient Instructions: 1)  Please schedule a follow-up appointment in 6 months well w/labs.   Not Administered:    Influenza Vaccine not given due to: declined

## 2011-01-09 NOTE — Assessment & Plan Note (Signed)
Summary: CPX / NWS  #   Vital Signs:  Patient profile:   35 year old female Height:      66 inches Weight:      371 pounds BMI:     60.10 O2 Sat:      96 % on Room air Temp:     98.1 degrees F oral Pulse rate:   85 / minute BP sitting:   150 / 110  (left arm) Cuff size:   large  Vitals Entered By: Lucious Groves (May 24, 2010 8:54 AM)  O2 Flow:  Room air CC: CPX./kb Is Patient Diabetic? No Pain Assessment Patient in pain? no      Comments Patient notes that she is not taking Citalopram or Prednisone./kb   CC:  CPX./kb.  History of Present Illness: The patient presents for a wellness examination  C/o cough  Current Medications (verified): 1)  Qvar 40 Mcg/act  Aers (Beclomethasone Dipropionate) .... 2 Inh Qd 2)  Proair Hfa 108 (90 Base) Mcg/act  Aers (Albuterol Sulfate) .... 2 Inh Q4h As Needed Shortness of Breath 3)  Singulair 10 Mg Tabs (Montelukast Sodium) .[name].. 1 By Mouth Daily 4)  Vitamin D3 1000 Unit  Tabs (Cholecalciferol) .[name].. 1 By Mouth Daily 5)  Bystolic 20 Mg Tabs (Nebivolol Hcl) .[name].. 1 By Mouth Qd 6)  Citalopram Hydrobromide 10 Mg  Tabs (Citalopram Hydrobromide) .... Take 1 Tab By Mouth Daily 7)  Tribenzor 40/5/25 Mg .... Take 1 Tab By Mouth Daily 8)  Furosemide 40 Mg Tabs (Furosemide) .... Take Two Tablets Daily 9)  Potassium Chloride Cr 10 Meq  Tbcr (Potassium Chloride) .[name].. 1 By Mouth Qd 10)  Symbicort 160-4.5 Mcg/act Aero (Budesonide-Formoterol Fumarate) .... 2 Inh Two Times A Day 11)  Promethazine-Codeine 6.25-10 Mg/43ml Syrp (Promethazine-Codeine) .... 5-10 Ml By Mouth Q Id As Needed Cough 12)  Sudafed 12 Hour 120 Mg Xr12h-Tab (Pseudoephedrine Hcl) .[name].. 1 By Mouth Two Times A Day As Needed For Congestion 13)  Prednisone 10 Mg Tabs (Prednisone) .... Take 40mg  Qd For 3 Days, Then 20 Mg Qd For 3 Days, Then 10mg  Qd For 6 Days, Then Stop. Take Pc.  Allergies (verified): 1)  ! Keflex  Past History:  Past Medical History: Last updated:  12/06/2009 Hypertension Obesity Amenorrhea   Dr Marcelle Overlie Asthma Depression  Past Surgical History: Last updated: 05/10/2010 Denies surgical history  Family History: Last updated: 07/07/2008 Family History Hypertension  Social History: Last updated: 07/07/2008 Single Never Smoked  Review of Systems  The patient denies anorexia, fever, weight loss, weight gain, vision loss, decreased hearing, hoarseness, chest pain, syncope, dyspnea on exertion, peripheral edema, prolonged cough, headaches, hemoptysis, abdominal pain, melena, hematochezia, severe indigestion/heartburn, hematuria, incontinence, genital sores, muscle weakness, suspicious skin lesions, transient blindness, difficulty walking, depression, unusual weight change, abnormal bleeding, enlarged lymph nodes, angioedema, and breast masses.    Physical Exam  General:  alert and overweight-appearing.   Head:  normocephalic and atraumatic.   Eyes:  vision grossly intact, pupils equal, and pupils round.   Ears:  B TMs WNL Nose:  no external deformity and no nasal discharge.   Mouth:  WNLt Neck:  supple and no masses.   Lungs:  normal respiratory effort and normal breath sounds.   Heart:  normal rate and regular rhythm.   Abdomen:  soft, non-tender, and normal bowel sounds.   Msk:  WNL Pulses:  R and L carotid,radial,femoral,dorsalis pedis and posterior tibial pulses are full and equal bilaterally Extremities:  No edema B  Neurologic:  No cranial nerve deficits noted. Station and gait are normal. Plantar reflexes are down-going bilaterally. DTRs are symmetrical throughout. Sensory, motor and coordinative functions appear intact. Skin:  Clear Cervical Nodes:  No lymphadenopathy noted Inguinal Nodes:  No significant adenopathy Psych:  Cognition and judgment appear intact. Alert and cooperative with normal attention span and concentration. No apparent delusions, illusions, hallucinations   Impression &  Recommendations:  Problem # 1:  PHYSICAL EXAMINATION (ICD-V70.0) Assessment New Health and age related issues were discussed. Available screening tests and vaccinations were discussed as well. Healthy life style including good diet and execise was discussed.  Orders: TLB-BMP (Basic Metabolic Panel-BMET) (80048-METABOL) TLB-CBC Platelet - w/Differential (85025-CBCD) TLB-Hepatic/Liver Function Pnl (80076-HEPATIC) TLB-Lipid Panel (80061-LIPID) TLB-TSH (Thyroid Stimulating Hormone) (84443-TSH) TLB-Udip ONLY (81003-UDIP)  Problem # 2:  ASTHMA (ICD-493.90) Assessment: Improved  Her updated medication list for this problem includes:    Qvar 40 Mcg/act Aers (Beclomethasone dipropionate) .[name]... 2 inh qd    Proair Hfa 108 (90 Base) Mcg/act Aers (Albuterol sulfate) .[name]... 2 inh q4h as needed shortness of breath    Singulair 10 Mg Tabs (Montelukast sodium) .[name]... 1 by mouth daily    Symbicort 160-4.5 Mcg/act Aero (Budesonide-formoterol fumarate) .[name]... 2 inh two times a day    Prednisone 10 Mg Tabs (Prednisone) .[name]... Take 40mg  qd for 3 days, then 20 mg qd for 3 days, then 10mg  qd for 6 days, then stop. take pc.  Problem # 3:  COUGH (ICD-786.2) Assessment: Unchanged Try Nexium If not better take Prednisone Had a chest CT 6/10  Problem # 4:  HYPERTENSION (ICD-401.9) Assessment: Deteriorated  Her updated medication list for this problem includes:    Tribenzor 40-10-25 Mg Tabs (Olmesartan-amlodipine-hctz) .[name]... 1 by mouth once daily for blood pressure    Bystolic 20 Mg Tabs (Nebivolol hcl) .[name]... 1 by mouth qd    Furosemide 40 Mg Tabs (Furosemide) .[name]... Take two tablets daily  Complete Medication List: 1)  Tribenzor 40-10-25 Mg Tabs (Olmesartan-amlodipine-hctz) .[name].. 1 by mouth once daily for blood pressure 2)  Bystolic 20 Mg Tabs (Nebivolol hcl) .[name].. 1 by mouth qd 3)  Qvar 40 Mcg/act Aers (Beclomethasone dipropionate) .... 2 inh qd 4)  Proair Hfa 108 (90 Base) Mcg/act Aers (Albuterol sulfate)  .... 2 inh q4h as needed shortness of breath 5)  Singulair 10 Mg Tabs (Montelukast sodium) .[name].. 1 by mouth daily 6)  Vitamin D3 1000 Unit Tabs (Cholecalciferol) .[name].. 1 by mouth daily 7)  Citalopram Hydrobromide 10 Mg Tabs (Citalopram hydrobromide) .... Take 1 tab by mouth daily 8)  Furosemide 40 Mg Tabs (Furosemide) .... Take two tablets daily 9)  Potassium Chloride Cr 10 Meq Tbcr (Potassium chloride) .[name].. 1 by mouth qd 10)  Symbicort 160-4.5 Mcg/act Aero (Budesonide-formoterol fumarate) .... 2 inh two times a day 11)  Promethazine-codeine 6.25-10 Mg/62ml Syrp (Promethazine-codeine) .... 5-10 ml by mouth q id as needed cough 12)  Sudafed 12 Hour 120 Mg Xr12h-tab (Pseudoephedrine hcl) .[name].. 1 by mouth two times a day as needed for congestion 13)  Prednisone 10 Mg Tabs (Prednisone) .... Take 40mg  qd for 3 days, then 20 mg qd for 3 days, then 10mg  qd for 6 days, then stop. take pc. 14)  Nexium 40 Mg Cpdr (Esomeprazole magnesium) .[name].. 1 by mouth qam ( medically necessary)  Other Orders: TLB-A1C / Hgb A1C (Glycohemoglobin) (83036-A1C)  Patient Instructions: 1)  Please schedule a follow-up appointment in 4 months. 2)  Call if you are not better in a reasonable amount  of time or if worse.  Prescriptions: TRIBENZOR 40-10-25 MG TABS (OLMESARTAN-AMLODIPINE-HCTZ) 1 by mouth once daily for blood pressure  #90 x 3   Entered and Authorized by:   Tresa Garter MD   Signed by:   Tresa Garter MD on 05/28/2010   Method used:   Print then Give to Patient   RxID:   [id number] NEXIUM 40 MG CPDR (ESOMEPRAZOLE MAGNESIUM) 1 by mouth qam ( medically necessary)  #30 x 12   Entered and Authorized by:   Tresa Garter MD   Signed by:   Tresa Garter MD on 05/28/2010   Method used:   Print then Give to Patient   RxID:   [id number]

## 2011-01-09 NOTE — Progress Notes (Signed)
Summary: Cough  Phone Note Call from Patient Call back at Work Phone [phone]   Caller: Patient [phone] Summary of Call: Pt called stating that she has had cough x 2 month. Pt has tried  nexium, prednisone and cough syrup with no relief, per pt cough is now worse. Pt says she was advise by MD to call back if cough did not improve with last round of treatment. Please advise, OV? Initial call taken by: Margaret Pyle, CMA,  June 30, 2010 12:12 PM  Follow-up for Phone Call        Pulm cons pls  Follow-up by: Tresa Garter MD,  June 30, 2010 5:35 PM  Additional Follow-up for Phone Call Additional follow up Details #1::        pt informed of referral and advised that she will be contacted by [facility] with appt info Additional Follow-up by: Margaret Pyle, CMA,  July 01, 2010 9:30 AM

## 2011-01-31 ENCOUNTER — Ambulatory Visit: Payer: Self-pay | Admitting: Internal Medicine

## 2011-01-31 ENCOUNTER — Encounter: Payer: Self-pay | Admitting: Internal Medicine

## 2011-01-31 ENCOUNTER — Ambulatory Visit (INDEPENDENT_AMBULATORY_CARE_PROVIDER_SITE_OTHER): Payer: BC Managed Care – PPO | Admitting: Internal Medicine

## 2011-01-31 DIAGNOSIS — H6690 Otitis media, unspecified, unspecified ear: Secondary | ICD-10-CM

## 2011-02-01 ENCOUNTER — Ambulatory Visit: Payer: Self-pay | Admitting: Internal Medicine

## 2011-02-06 NOTE — Assessment & Plan Note (Signed)
Summary: sore throat/cant swallow/plot/lb   Vital Signs:  Patient profile:   35 year old female Height:      66 inches (167.64 cm) O2 Sat:      97 % on Room air Temp:     98.5 degrees F (36.94 degrees C) oral Pulse rate:   93 / minute BP sitting:   144 / 92  (left arm) Cuff size:   large  Vitals Entered By: Orlan Leavens RMA (January 31, 2011 10:16 AM)  O2 Flow:  Room air CC: Sore throat & (L) earache, URI symptoms Is Patient Diabetic? No Pain Assessment Patient in pain? yes     Location: (L) ear Type: burning   Primary Care Provider:  Tresa Garter MD  CC:  Sore throat & (L) earache and URI symptoms.  History of Present Illness:  URI Symptoms      This is a 35 year old woman who presents with URI symptoms.  The symptoms began 1.5 weeks ago.  The severity is described as moderate.  started as ST with head cold - wax/wane symptoms suddenly worse last 48h, now with left ear pain and "burning".  The patient reports sore throat, dry cough, earache, and sick contacts, but denies nasal congestion, clear nasal discharge, purulent nasal discharge, and productive cough.  Associated symptoms include low-grade fever (<100.5 degrees), use of an antipyretic, and response to antipyretic.  The patient denies stiff neck, dyspnea, wheezing, rash, vomiting, and diarrhea.  The patient also reports itchy throat, sneezing, muscle aches, and severe fatigue.  The patient denies headache.  Risk factors for Strep sinusitis include Strep exposure and tender adenopathy.  The patient denies the following risk factors for Strep sinusitis: unilateral facial pain, unilateral nasal discharge, double sickening, tooth pain, and absence of cough.    Current Medications (verified): 1)  Vitamin D3 1000 Unit  Tabs (Cholecalciferol) .[name].. 1 By Mouth Daily 2)  Furosemide 40 Mg Tabs (Furosemide) .... Take Two Tablets Daily 3)  Potassium Chloride Cr 10 Meq  Tbcr (Potassium Chloride) .[name].. 1 By Mouth Qd 4)  Nexium 40  Mg Cpdr (Esomeprazole Magnesium) .... Take  One 30-60 Min Before First Meal of The Day 5)  Pepcid Ac Maximum Strength 20 Mg Tabs (Famotidine) .... One At Bedtime 6)  Bystolic 20 Mg Tabs (Nebivolol Hcl) .... One Daily 7)  Tramadol Hcl 50 Mg  Tabs (Tramadol Hcl) .... One To Two By Mouth Every 4-6 Hours If Needed For Cough  Allergies (verified): 1)  ! Keflex  Past History:  Past Medical History: Hypertension Obesity Amenorrhea   Dr Marcelle Overlie  Asthma Depression Chronic cough     - onset 04/2010 > resolved July 18, 2010 on max gerd rx and off ICS/ on bystolic GERD  Review of Systems       The patient complains of hoarseness.  The patient denies weight loss, vision loss, decreased hearing, syncope, hemoptysis, and abdominal pain.    Physical Exam  General:  alert and overweight-appearing.  mildly ill appearing Eyes:  vision grossly intact; pupils equal, round and reactive to light.  conjunctiva and lids normal.    Ears:  normal pinnae bilaterally, without erythema, swelling, or tenderness to palpation. TMs B red with clear effusion, no cerumen impaction. Hearing grossly normal bilaterally  Mouth:  teeth and gums in good repair; mucous membranes moist, without lesions or ulcers. oropharynx clear without exudate, mod erythema. no pnd. no tmj click or pain Neck:  thick, supple and no masses.  Lungs:  normal respiratory effort, no intercostal retractions or use of accessory muscles; normal breath sounds bilaterally - no crackles and no wheezes.    Heart:  normal rate, regular rhythm, no murmur, and no rub. BLE without edema.  Neurologic:  alert & oriented X3 and cranial nerves II-XII symetrically intact.  strength normal in all extremities, sensation intact to light touch, and gait normal. speech fluent without dysarthria or aphasia; follows commands with good comprehension.    Impression & Recommendations:  Problem # 1:  OTITIS MEDIA (ICD-382.9)  Her updated medication list for this  problem includes:    Azithromycin 250 Mg Tabs (Azithromycin) .[name]... 2 tabs by mouth today, then 1 by mouth daily starting tomorrow  Instructed on prevention and treatment. Call if no improvement in 48-72 hours or sooner if worsening symptoms.   Orders: Prescription Created Electronically [phone])  Complete Medication List: 1)  Vitamin D3 1000 Unit Tabs (Cholecalciferol) .[name].. 1 by mouth daily 2)  Furosemide 40 Mg Tabs (Furosemide) .... Take two tablets daily 3)  Potassium Chloride Cr 10 Meq Tbcr (Potassium chloride) .[name].. 1 by mouth qd 4)  Nexium 40 Mg Cpdr (Esomeprazole magnesium) .... Take  one 30-60 min before first meal of the day 5)  Pepcid Ac Maximum Strength 20 Mg Tabs (Famotidine) .... One at bedtime 6)  Bystolic 20 Mg Tabs (Nebivolol hcl) .... One daily 7)  Tramadol Hcl 50 Mg Tabs (Tramadol hcl) .... One to two by mouth every 4-6 hours if needed for cough 8)  Azithromycin 250 Mg Tabs (Azithromycin) .... 2 tabs by mouth today, then 1 by mouth daily starting tomorrow 9)  Prednisone (pak) 10 Mg Tabs (Prednisone) .... As directed x 6 days  Patient Instructions: 1)  it was good to see you today.  2)  Zpak antibiotics and Pred pak for inflammation - your prescriptions have been electronically submitted to your pharmacy. Please take as directed. Contact our office if you believe you're having problems with the medication(s).  3)  Get plenty of rest, drink lots of clear liquids, and use Tylenol or Ibuprofen for fever and comfort. Return in 7-10 days if you're not better:sooner if you're feeling worse. Prescriptions: PREDNISONE (PAK) 10 MG TABS (PREDNISONE) as directed x 6 days  #1 pak x 0   Entered and Authorized by:   Newt Lukes MD   Signed by:   Newt Lukes MD on 01/31/2011   Method used:   Electronically to        [facility]  [address]  [phone]* (retail)       [address] [place], [place]  [address]       Ph: [phone] or [phone]       Fax: [phone]    RxID:   [id number] AZITHROMYCIN 250 MG TABS (AZITHROMYCIN) 2 tabs by mouth today, then 1 by mouth daily starting tomorrow  #6 x 0   Entered and Authorized by:   Newt Lukes MD   Signed by:   Newt Lukes MD on 01/31/2011   Method used:   [facility] to        [facility]  [phone]* (retail)       [address] [place], [place]  [address]       Ph: [phone] or [phone]       Fax: [phone]   RxID:   [id number]    Orders Added: 1)  Est. Patient  Level IV [17616] 2)  Prescription Created Electronically [phone]

## 2011-02-07 ENCOUNTER — Telehealth: Payer: Self-pay | Admitting: Internal Medicine

## 2011-02-12 IMAGING — CT CT HEAD W/O CM
1 series · 16 of 30 positions shown, 20 images · non-contrast
Comparison: MRI brain 10/14/2000

CLINICAL DATA: Headache.

CT HEAD WITHOUT CONTRAST
TECHNIQUE: Contiguous axial images were obtained from the base of
the skull through the vertex without contrast.

[Series 2: brain · axial · 0.47mm/px · z∈[+130,+275]mm · 16 of 32 slices shown, 20 images]
[im 2/32  brain]
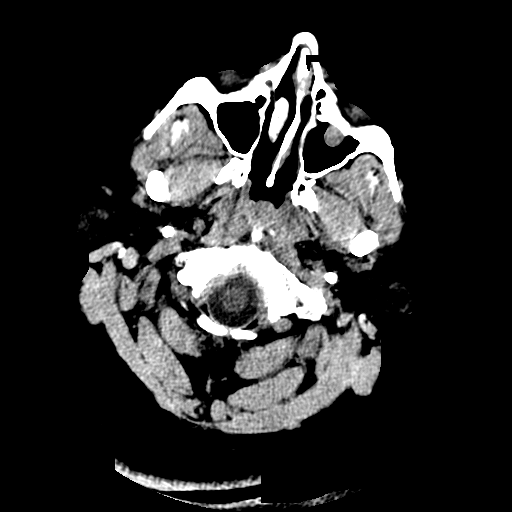
[im 2/32  bone]
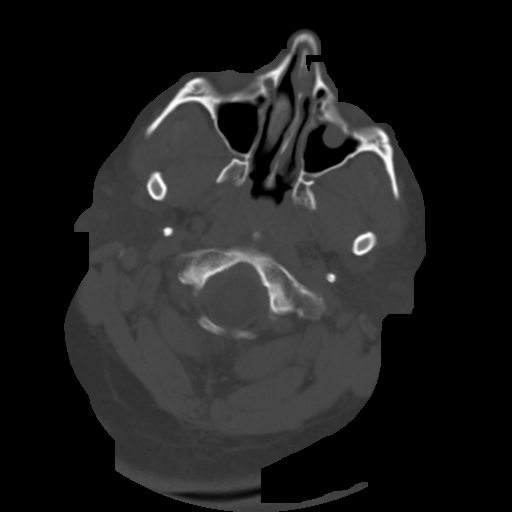
[im 4/32  brain]
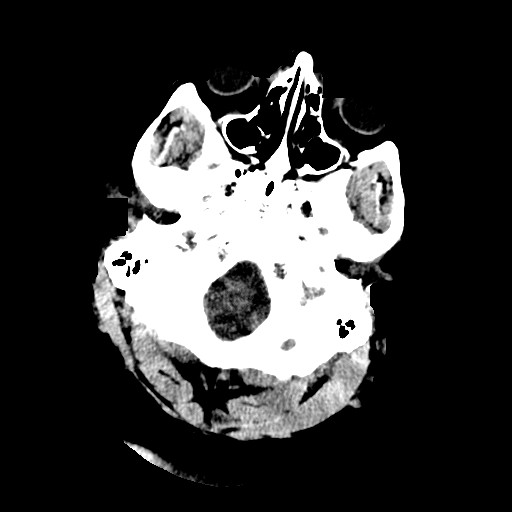
[im 6/32  brain]
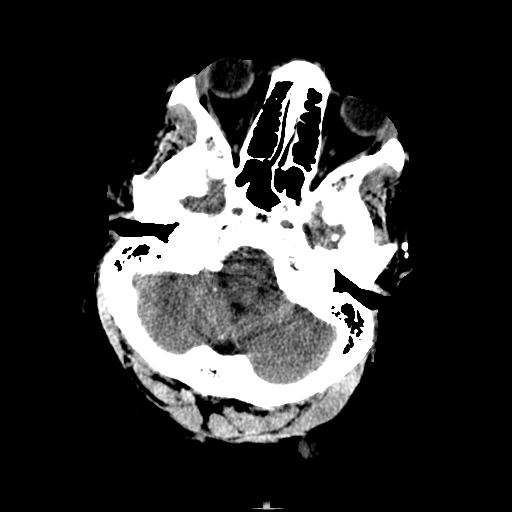
[im 8/32  brain]
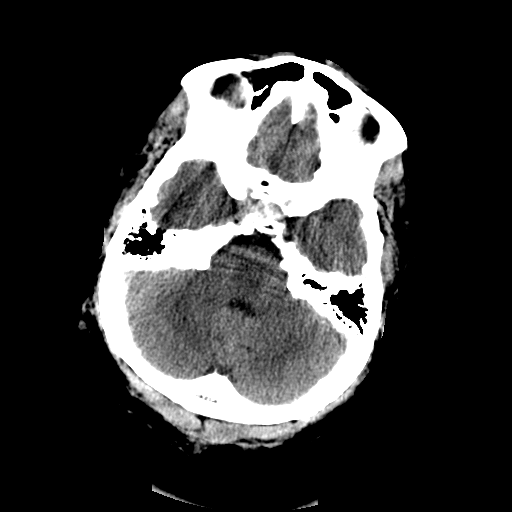
[im 9/32  brain]
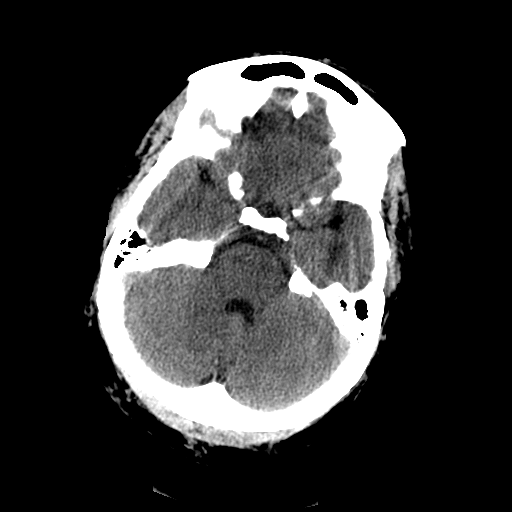
[im 9/32  bone]
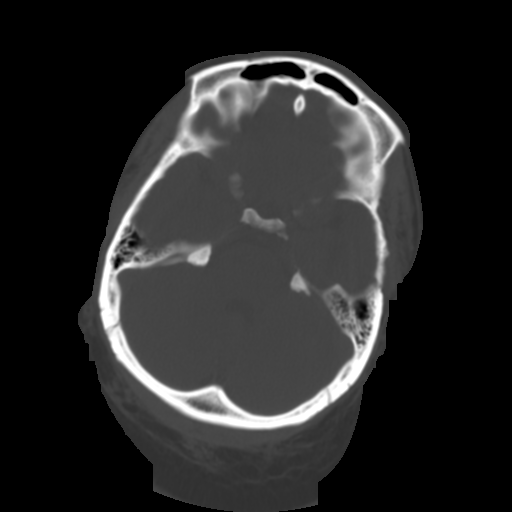
[im 11/32  brain]
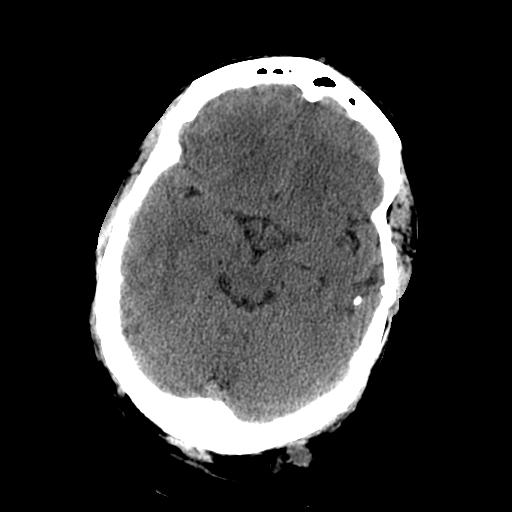
[im 13/32  brain]
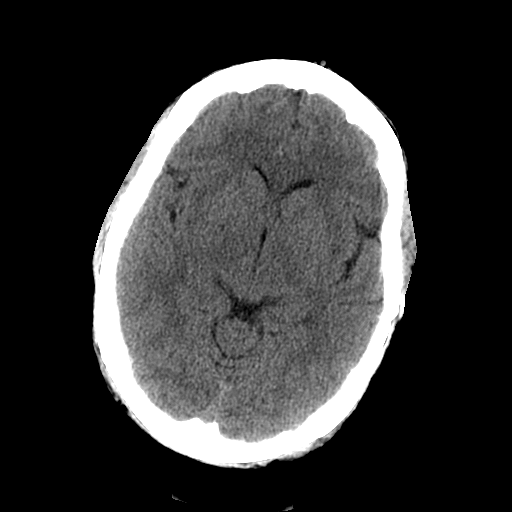
[im 15/32  brain]
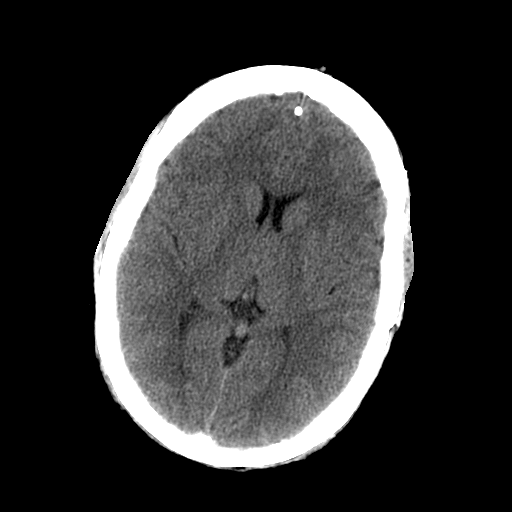
[im 17/32  brain]
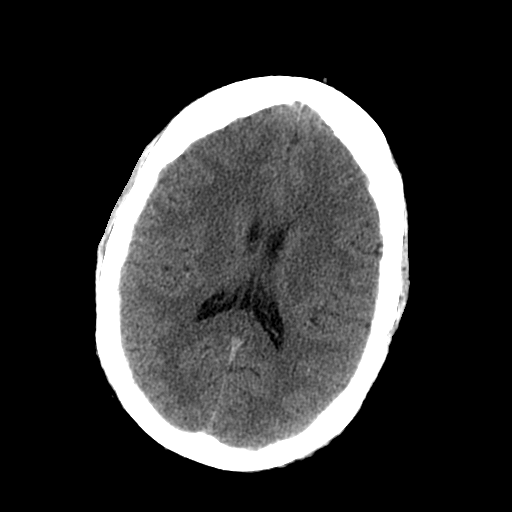
[im 17/32  bone]
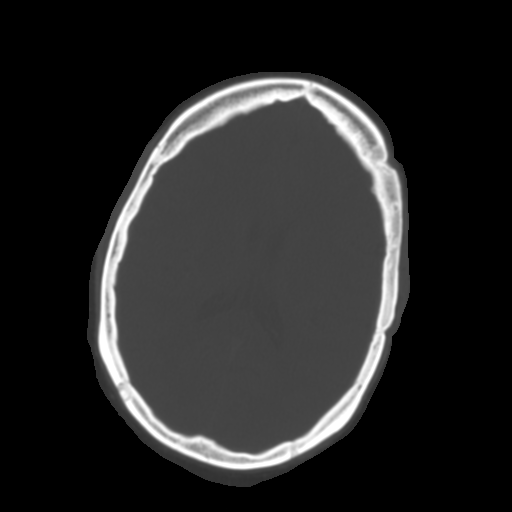
[im 19/32  brain]
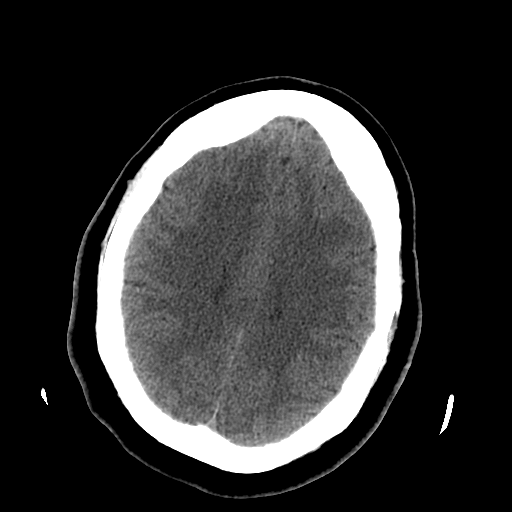
[im 21/32  brain]
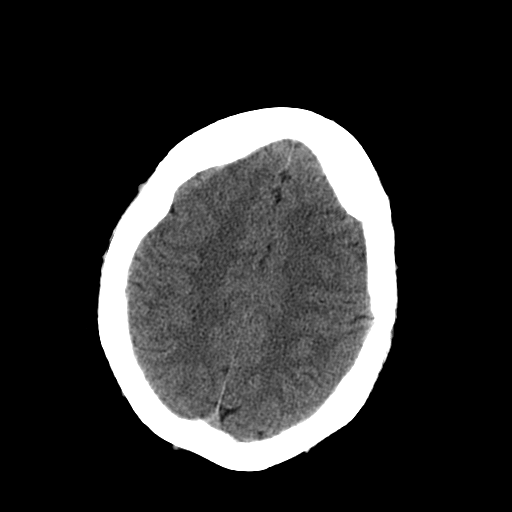
[im 23/32  brain]
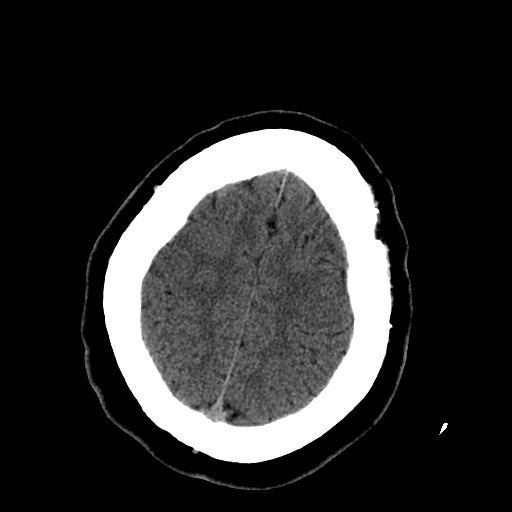
[im 24/32  brain]
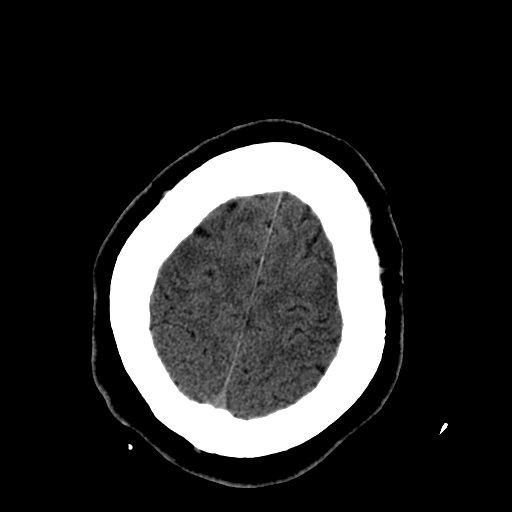
[im 24/32  bone]
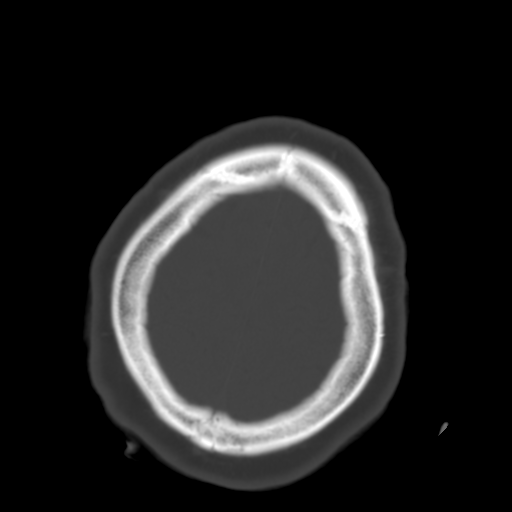
[im 26/32  brain]
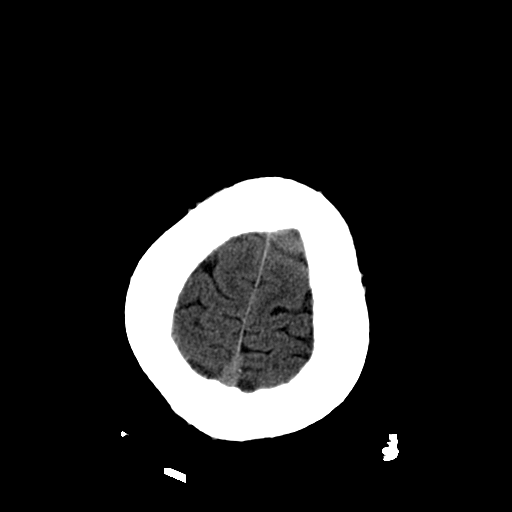
[im 28/32  brain]
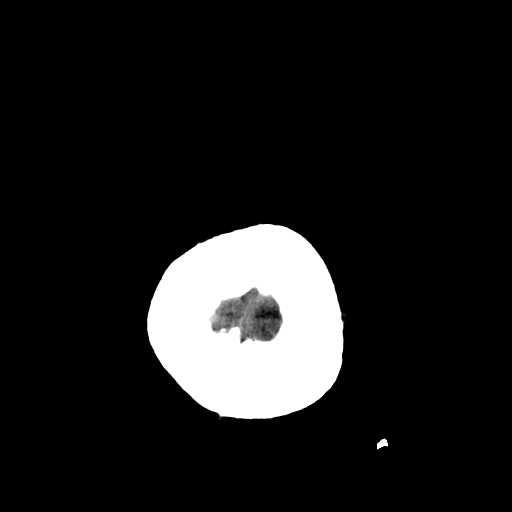
[im 30/32  brain]
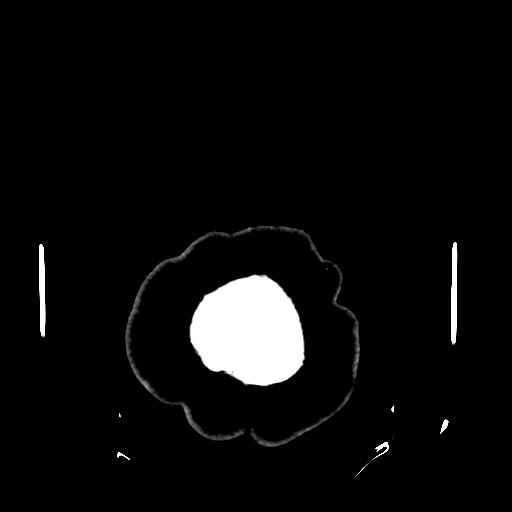

[16 of 30 positions shown; findings below may reference images not displayed]

FINDINGS: The ventricles are normal.  No extra-axial fluid
collections are seen.  The brainstem and cerebellum are
unremarkable.  No acute intracranial findings such as infarction or
hemorrhage.  No mass lesions. There are surgical changes in the
left temporal lobe with an area of encephalomalacia and
calcification.  No CT findings to suggest recurrent tumor.

The bony calvarium is intact except for remote surgical changes
from a left frontal parietal craniotomy.  The visualized paranasal
sinuses and mastoid air cells are clear.
IMPRESSION: No acute intracranial findings or mass lesions. Stable
postoperative changes in the left temporal lobe.

## 2011-02-15 NOTE — Progress Notes (Signed)
Summary: ?ABX  Phone Note Call from Patient   Summary of Call: Pt completed both meds as prescribed. She was beginning to feel better but today c/o increase in sore throat. Does she need re-eval? Or additional meds?  Initial call taken by: Lamar Sprinkles, CMA,  February 07, 2011 10:40 AM  Follow-up for Phone Call        Use over-the-counter medicines for "cold": Tylenol  650mg  or Advil 400mg  every 6 hours  for fever; Delsym or Robutussin for cough. Mucinex for congestion. Ricola or Halls for sore throat. OK to ref Zpac if worse Follow-up by: Tresa Garter MD,  February 07, 2011 6:04 PM  Additional Follow-up for Phone Call Additional follow up Details #1::        left mess to call office back................Marland KitchenLamar Sprinkles, CMA  February 08, 2011 5:21 PM   Pt's mother informed Additional Follow-up by: Lamar Sprinkles, CMA,  February 09, 2011 5:40 PM

## 2011-03-14 ENCOUNTER — Other Ambulatory Visit: Payer: Self-pay

## 2011-03-14 LAB — URINALYSIS, ROUTINE W REFLEX MICROSCOPIC
Bilirubin Urine: NEGATIVE
Glucose, UA: NEGATIVE mg/dL
Ketones, ur: NEGATIVE mg/dL
Leukocytes, UA: NEGATIVE
Nitrite: NEGATIVE
Protein, ur: NEGATIVE mg/dL
Specific Gravity, Urine: 1.017 (ref 1.005–1.030)
Urobilinogen, UA: 0.2 mg/dL (ref 0.0–1.0)
pH: 7 (ref 5.0–8.0)

## 2011-03-14 LAB — URINE MICROSCOPIC-ADD ON

## 2011-03-14 LAB — DIFFERENTIAL
Basophils Absolute: 0.1 10*3/uL (ref 0.0–0.1)
Basophils Relative: 1 % (ref 0–1)
Eosinophils Absolute: 0.2 K/uL (ref 0.0–0.7)
Eosinophils Relative: 3 % (ref 0–5)
Lymphocytes Relative: 34 % (ref 12–46)
Lymphs Abs: 2.3 K/uL (ref 0.7–4.0)
Monocytes Absolute: 0.3 K/uL (ref 0.1–1.0)
Monocytes Relative: 5 % (ref 3–12)
Neutro Abs: 4 10*3/uL (ref 1.7–7.7)
Neutrophils Relative %: 58 % (ref 43–77)

## 2011-03-14 LAB — CBC
HCT: 40.1 % (ref 36.0–46.0)
Hemoglobin: 13.8 g/dL (ref 12.0–15.0)
MCHC: 34.4 g/dL (ref 30.0–36.0)
MCV: 88.6 fL (ref 78.0–100.0)
Platelets: 289 10*3/uL (ref 150–400)
RBC: 4.52 MIL/uL (ref 3.87–5.11)
RDW: 15 % (ref 11.5–15.5)
WBC: 6.9 10*3/uL (ref 4.0–10.5)

## 2011-03-14 LAB — BASIC METABOLIC PANEL
BUN: 6 mg/dL (ref 6–23)
Creatinine, Ser: 0.83 mg/dL (ref 0.4–1.2)
GFR calc Af Amer: >60 mL/min (ref >60)
GFR calc non Af Amer: >60 mL/min (ref >60)
Glucose, Bld: 109 mg/dL — ABNORMAL HIGH (ref 70–99)
Potassium: 3.4 mEq/L — ABNORMAL LOW (ref 3.5–5.1)
Sodium: 136 mEq/L (ref 135–145)

## 2011-03-14 LAB — BASIC METABOLIC PANEL WITH GFR
CO2: 31 meq/L (ref 19–32)
Calcium: 9.3 mg/dL (ref 8.4–10.5)
Chloride: 102 meq/L (ref 96–112)

## 2011-03-21 ENCOUNTER — Ambulatory Visit: Payer: Self-pay | Admitting: Internal Medicine

## 2011-04-18 ENCOUNTER — Encounter: Payer: Self-pay | Admitting: Internal Medicine

## 2011-04-20 ENCOUNTER — Other Ambulatory Visit (INDEPENDENT_AMBULATORY_CARE_PROVIDER_SITE_OTHER): Payer: BC Managed Care – PPO | Admitting: Internal Medicine

## 2011-04-20 ENCOUNTER — Ambulatory Visit (INDEPENDENT_AMBULATORY_CARE_PROVIDER_SITE_OTHER): Payer: BC Managed Care – PPO | Admitting: Internal Medicine

## 2011-04-20 ENCOUNTER — Encounter: Payer: Self-pay | Admitting: Internal Medicine

## 2011-04-20 ENCOUNTER — Other Ambulatory Visit (INDEPENDENT_AMBULATORY_CARE_PROVIDER_SITE_OTHER): Payer: BC Managed Care – PPO

## 2011-04-20 DIAGNOSIS — I10 Essential (primary) hypertension: Secondary | ICD-10-CM

## 2011-04-20 DIAGNOSIS — R0609 Other forms of dyspnea: Secondary | ICD-10-CM

## 2011-04-20 DIAGNOSIS — R609 Edema, unspecified: Secondary | ICD-10-CM

## 2011-04-20 DIAGNOSIS — R300 Dysuria: Secondary | ICD-10-CM

## 2011-04-20 DIAGNOSIS — R0683 Snoring: Secondary | ICD-10-CM

## 2011-04-20 DIAGNOSIS — R0989 Other specified symptoms and signs involving the circulatory and respiratory systems: Secondary | ICD-10-CM

## 2011-04-20 DIAGNOSIS — K219 Gastro-esophageal reflux disease without esophagitis: Secondary | ICD-10-CM

## 2011-04-20 DIAGNOSIS — R7309 Other abnormal glucose: Secondary | ICD-10-CM

## 2011-04-20 LAB — CBC WITH DIFFERENTIAL/PLATELET
Basophils Absolute: 0 10*3/uL (ref 0.0–0.1)
Hemoglobin: 13.3 g/dL (ref 12.0–15.0)
Lymphocytes Relative: 37.3 % (ref 12.0–46.0)
Monocytes Relative: 5.7 % (ref 3.0–12.0)
Neutro Abs: 4.4 10*3/uL (ref 1.4–7.7)
Neutrophils Relative %: 54.8 % (ref 43.0–77.0)
RDW: 15.2 % — ABNORMAL HIGH (ref 11.5–14.6)

## 2011-04-20 LAB — URINALYSIS, ROUTINE W REFLEX MICROSCOPIC
Ketones, ur: NEGATIVE
Leukocytes, UA: NEGATIVE
Nitrite: NEGATIVE
Specific Gravity, Urine: 1.01 (ref 1.000–1.030)
Urobilinogen, UA: 0.2 (ref 0.0–1.0)
pH: 6.5 (ref 5.0–8.0)

## 2011-04-20 LAB — HEMOGLOBIN A1C: Hgb A1c MFr Bld: 6 % (ref 4.6–6.5)

## 2011-04-20 LAB — COMPREHENSIVE METABOLIC PANEL
Albumin: 3.8 g/dL (ref 3.5–5.2)
Alkaline Phosphatase: 65 U/L (ref 39–117)
BUN: 9 mg/dL (ref 6–23)
GFR: 103.35 mL/min (ref >60.00)
Glucose, Bld: 66 mg/dL — ABNORMAL LOW (ref 70–99)
Potassium: 3.9 mEq/L (ref 3.5–5.1)
Total Bilirubin: 0.4 mg/dL (ref 0.3–1.2)

## 2011-04-20 LAB — TSH: TSH: 1.75 u[IU]/mL (ref 0.35–5.50)

## 2011-04-20 MED ORDER — FUROSEMIDE 40 MG PO TABS: 40.0000 mg | ORAL_TABLET | Freq: Every day | ORAL | Status: DC

## 2011-04-20 MED ORDER — METFORMIN HCL 500 MG PO TABS: 500.0000 mg | ORAL_TABLET | Freq: Every day | ORAL | Status: DC

## 2011-04-20 NOTE — Progress Notes (Signed)
  Subjective:    Patient ID: Kim Barnes, female    DOB: [birth year], 35 y.o.   MRN: [id number]  HPI  The patient presents for a follow-up of  chronic hypertension, chronic GERD, type 2 diabetes controlled with medicines. Her OB Rx her Metformin   Review of Systems  Constitutional: Negative for fatigue.  HENT: Negative for tinnitus.   Cardiovascular: Negative for leg swelling.  Genitourinary: Negative for flank pain and vaginal bleeding.  Musculoskeletal: Negative for gait problem.  Skin: Negative for rash and wound.  Neurological: Negative for numbness.  Psychiatric/Behavioral: Negative for sleep disturbance and dysphoric mood.       Objective:   Physical Exam  Constitutional: She appears well-developed and well-nourished. No distress.  HENT:  Head: Normocephalic.  Right Ear: External ear normal.  Left Ear: External ear normal.  Nose: Nose normal.  Mouth/Throat: Oropharynx is clear and moist.  Eyes: Conjunctivae are normal. Pupils are equal, round, and reactive to light. Right eye exhibits no discharge. Left eye exhibits no discharge.  Neck: Normal range of motion. Neck supple. No JVD present. No tracheal deviation present. No thyromegaly present.  Cardiovascular: Normal rate, regular rhythm and normal heart sounds.   Pulmonary/Chest: No stridor. No respiratory distress. She has no wheezes.  Abdominal: Soft. Bowel sounds are normal. She exhibits no distension and no mass. There is no tenderness. There is no rebound and no guarding.  Musculoskeletal: She exhibits no edema and no tenderness.  Lymphadenopathy:    She has no cervical adenopathy.  Neurological: She displays normal reflexes. No cranial nerve deficit. She exhibits normal muscle tone. Coordination normal.  Skin: No rash noted. No erythema.  Psychiatric: She has a normal mood and affect. Her behavior is normal. Judgment and thought content normal.          Assessment & Plan:  HYPERTENSION On Rx BP Readings  from Last 3 Encounters:  04/20/11 134/98  01/31/11 144/92  09/20/10 130/90     Edema Off and on. Resume Lasix prn  GERD Off Nexium - doing well  HYPERGLYCEMIA Metformin

## 2011-04-20 NOTE — Assessment & Plan Note (Signed)
On Rx BP Readings from Last 3 Encounters:  04/20/11 134/98  01/31/11 144/92  09/20/10 130/90

## 2011-04-20 NOTE — Assessment & Plan Note (Signed)
Metformin 

## 2011-04-20 NOTE — Assessment & Plan Note (Signed)
Off Nexium - doing well

## 2011-04-20 NOTE — Assessment & Plan Note (Signed)
Off and on. Resume Lasix prn

## 2011-04-22 ENCOUNTER — Telehealth: Payer: Self-pay | Admitting: Internal Medicine

## 2011-04-22 NOTE — Telephone Encounter (Signed)
Kim Barnes, please, inform patient that all labs are normal  thx

## 2011-04-23 NOTE — Telephone Encounter (Signed)
Left detailed mess informing pt of below.  

## 2011-04-24 ENCOUNTER — Encounter: Payer: Self-pay | Admitting: Internal Medicine

## 2011-04-27 NOTE — Op Note (Signed)
NAME:  Kim Barnes, Kim Barnes                ACCOUNT NO.:  [id number]   MEDICAL RECORD NO.:  [id number]          PATIENT TYPE:  AMB   LOCATION:  SDC                           FACILITY:  [facility]   PHYSICIAN:  Duke Salvia. Marcelle Overlie, M.D.DATE OF BIRTH:  Mar 08, 1976   DATE OF PROCEDURE:  02/21/2006  DATE OF DISCHARGE:                                 OPERATIVE REPORT   PREOPERATIVE DIAGNOSIS:  Abnormal bleeding, endometrial polyp,  sonohysterogram.   POSTOPERATIVE DIAGNOSIS:  Abnormal bleeding, endometrial polyp,  sonohysterogram.   PROCEDURE:  D&C hysteroscopy.   SURGEON:  Dr. Marcelle Overlie   ANESTHESIA:  General plus paracervical block.   SPECIMENS REMOVED:  Endometrial curettings.   COMPLICATIONS:  None.   BLOOD LOSS:  Minimal.   DESCRIPTION OF PROCEDURE:  The patient is taken to the operating room and  after adequate level of general endotracheal anesthesia was obtained with  legs in stirrups. The perineum and vagina prepped and draped with Betadine  and the bladder was drained, EUA carried out.  Uterus midposition, normal  size. Adnexa negative. Exam difficult due to her weight and 300+ pounds.  Speculum was positioned. Cervix grasped with tenaculum. Paracervical block  created by infiltrating at 3 and 9 o'clock submucosally 5-7 mL of 1%  Xylocaine on either side after negative aspiration. Uterus sounded to 8 cm,  progressively dilated to a 27 Pratt dilator. The 7 mm diagnostic  hysteroscope was inserted revealing a fairly normal cavity with some  moderate endometrial buildup. Sharp curettage was carried out. Specimen  submitted as endometrial curetting. Scope was reinserted. The cavity was  irrigated and no other abnormalities of the uterine cavity were noted. She  tolerated this well. She did receive Toradol at the end of the procedure and  went to recovery room in good condition.      Richard M. Marcelle Overlie, M.D.  Electronically Signed     RMH/MEDQ  D:  02/21/2006  T:  02/21/2006  Job:   [id number]

## 2011-04-27 NOTE — Op Note (Signed)
NAME:  Kim Barnes, Kim Barnes                          ACCOUNT NO.:  [id number]   MEDICAL RECORD NO.:  [id number]                   PATIENT TYPE:  AMB   LOCATION:  SDC                                  [facility]ILITY[facility]  [facility]   PHYSICIAN:  Duke Salvia. Marcelle Overlie, M.D.            DATE OF BIRTH:  1976-01-26   DATE OF PROCEDURE:  01/07/2004  DATE OF DISCHARGE:                                 OPERATIVE REPORT   PREOPERATIVE DIAGNOSIS:  Cervical dysplasia, grade 2.  Satisfactory  colposcopy.   POSTOPERATIVE DIAGNOSIS:  Cervical dysplasia, grade 2.  Satisfactory  colposcopy.   PROCEDURE:  Loop electrode excision procedure (LEEP).   SURGEON:  Duke Salvia. Marcelle Overlie, M.D.   ANESTHESIA:  Intracervical Xylocaine with epinephrine, plus IV sedation.   PROCEDURE AND FINDINGS:  The patient was taken to the operating room after  an adequate level of sedation was obtained with the patient's legs in  stirrups.  The LEEP speculum was positioned with good visualization in the  cervix.  The squamocolumnar junction had been mapped out previously in the  office.  1% intracervical Xylocaine with epinephrine was then injected  circumferentially around the cervix.  The appropriate loop electrode was  selected.  A one-pass LEEP performed with coagulation at the base with  excellent hemostasis.  She tolerated this well and went to the recovery room  in good condition.  Monsel's was applied at the cervix.                                               Richard M. Marcelle Overlie, M.D.    RMH/MEDQ  D:  01/07/2004  T:  01/07/2004  Job:  [id number]

## 2011-04-27 NOTE — Assessment & Plan Note (Signed)
[facility]                           PRIMARY CARE OFFICE NOTE   NAME:Kim Barnes, Kim Barnes                       MRN:          [id number]  DATE:04/11/2007                            DOB:          1976/10/21    The patient is a 35 year old female who presents for wellness  examination.   PAST MEDICAL HISTORY:  Hypertension.  Problems with weight.   FAMILY HISTORY:  History of hypertension in mother.   SOCIAL HISTORY:  She is single.  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  Never smoked.  No alcohol.   CURRENT MEDICATIONS:  Ran out of her blood pressure pills.  She is on  birth control pills.   REVIEW OF SYSTEMS:  Unable to lose weight.  No chest pain or shortness  of breath.  No syncope.  No leg swelling.  Occasional headache.  The  rest of the 18-point review of systems is negative.   PHYSICAL:  Blood pressure 176/113, pulse 97, temperature 98.9, weight  264 pounds (was 330).  HEENT:  With moist mucosa.  NECK:  Supple.  No thyromegaly or bruit.  LUNGS:  Clear.  No wheeze or rales.  HEART:  S1, S2.  No murmur or gallop.  ABDOMEN:  Soft and nontender.  No organomegaly or mass felt.  LOWER EXTREMITIES:  Without edema.  She is alert and cooperative.  Denies being depressed.  SKIN:  Clear.   ASSESSMENT AND PLAN:  1. Normal wellness examination.  Age/health issues discussed.  Healthy      life-style discussed.  She is starting [employer] tomorrow.      Regular gynecologic care yearly.  2. History of elevated glucose.  Weight loss.  Obtain hemoglobin A1c.  3. Exam pending in August.  4. Snoring.  Rule out sleep apnea.  Obtain sleep study.  5. Morbid obesity.  Lap band discussed.  She will start Weight      [employer] tomorrow.  6. Hypertension with noncompliance.  Will discontinue all previous      meds.  Start on Azor 10/40 daily.  Hopefully, that is all she      needs.  I will see her back in 6 to 8 weeks.     Georgina Quint. Plotnikov, MD  Electronically Signed   AVP/MedQ  DD: 04/11/2007  DT: 04/11/2007  Job #: [id number]

## 2011-04-27 NOTE — H&P (Signed)
NAME:  Kim Barnes, Kim Barnes                ACCOUNT NO.:  [id number]   MEDICAL RECORD NO.:  [id number]          PATIENT TYPE:  AMB   LOCATION:  SDC                           [facility]ILITY:  [facility]   PHYSICIAN:  Duke Salvia. Marcelle Overlie, M.D.DATE OF BIRTH:  05-12-1976   DATE OF ADMISSION:  DATE OF DISCHARGE:                                HISTORY & PHYSICAL   DATE OF SURGERY:  Outpatient surgery scheduled at [facility] for February 21, 2006.   CHIEF COMPLAINT:  Abnormal bleeding.   HISTORY OF PRESENT ILLNESS:  35 year old G2, P0 currently on Depo-Provera.  She underwent D and C, hysteroscopy a year to two ago that showed normal  endometrial curetting's.  On different OCP regimens and Depo she has  continued to experience bleeding.   She recently had a follow up [facility] that showed a polypoid material within the  uterus and presents now for D and C, hysteroscopy.  She has had a LEEP in  2005 with subsequent normal Pap's.   PAST MEDICAL HISTORY:  Essential hypertension.   ALLERGIES:  None.   CURRENT MEDICATIONS:  Depo-Provera.  She is also on Labetalol, Lotensin,  potassium supplements for essential hypertension.   FAMILY HISTORY:  Significant for father and mother both with hypertension.   PHYSICAL EXAMINATION:  VITAL SIGNS:  Temperature 98.2, blood pressure  152/108.  HEENT:  Unremarkable.  NECK:  Supple without masses.  LUNGS:  Clear.  CARDIOVASCULAR:  Regular rate and rhythm without murmurs, rubs or gallops  noted.  BREASTS:  Without masses.  ABDOMEN:  Soft, flat, nontender.  PELVIC:  Examination shows normal external genitalia, vagina clear, uterus  mid position normal size, adnexa negative.  EXTREMITIES:  Unremarkable.  NEUROLOGICAL: Unremarkable.   IMPRESSION:  Abnormal bleeding, endometrial polyp.   PLAN:  Dilatation and curettage, hysteroscopy.  This procedure, including  risks of bleeding, infection, other complications that may require open or  additional surgery were all reviewed  with the patient, which she understands  and accepts.      Richard M. Marcelle Overlie, M.D.  Electronically Signed     RMH/MEDQ  D:  02/18/2006  T:  02/18/2006  Job:  [id number]

## 2011-04-27 NOTE — Op Note (Signed)
NAME:  Kim Barnes, Kim Barnes                          ACCOUNT NO.:  [id number]   MEDICAL RECORD NO.:  [id number]                   PATIENT TYPE:  AMB   LOCATION:  DSC                                  FACILITY:  [facility]   PHYSICIAN:  Onalee Hua Barnes. Annalee Genta, M.D.            DATE OF BIRTH:  September 26, 1976   DATE OF PROCEDURE:  09/21/2002  DATE OF DISCHARGE:                                 OPERATIVE REPORT   PREOPERATIVE DIAGNOSIS:  Chronic tonsillitis.   POSTOPERATIVE DIAGNOSIS:  Chronic tonsillitis.   PROCEDURE:  Tonsillectomy.   SURGEON:  Kinnie Scales. Annalee Genta, M.D.   ANESTHESIA:  General endotracheal.   COMPLICATIONS:  None.   ESTIMATED BLOOD LOSS:  Minimal.   DISPOSITION:  The patient was transferred from the operating room to the  recovery room in stable condition.   INDICATIONS:  The patient is a 35 year old black female who is referred for  evaluation of chronic sore throat, chronic tonsillar discharge, and  recurrent infection.  Examination revealed 2+ cryptic tonsils with  significant scar tissue formation, no active infection.  No erythema or  induration.  Based on the patient's history and examination, I recommended  we consider her for tonsillectomy.  She presents today for surgical  tonsillectomy under general anesthesia at [facility].  The  risks, benefits and possible complications of this procedure were discussed  in detail with the patient and her family who understood and concur with our  plans for surgery which are scheduled as above.   DESCRIPTION OF PROCEDURE:  The patient is to brought to the operating on  September 21, 2002, and placed in the supine position on the operating table.  General endotracheal anesthesia was established without difficulty, and the  patient adequately anesthetized.  A Crowe-Davis mouth gag was inserted  without difficulty.  There are no loose or broken teeth, and the hard and  soft palate were intact.  The patient's tonsils were  examined.  She was  found to have 2+ cryptic tonsils.  I began on the left hand side and  inspected it in a subcapsular fashion.  The entire left tonsil was resected  beginning at the superior pole and dissecting to the tongue base removing  the entire tonsil.  The right tonsil was removed in a similar fashion and  tissue sent to pathology for gross microscopic evaluation.  The tonsillar  fossa was gently abraded with a dry tonsil sponge.  Several areas of point  hemorrhage were cauterized using suction cautery.  Crowe-Davis mouth gag was  released and reapplied.  There was no active bleeding.  An oral gastric tube  was passed and the stomach contents were aspirated.  The patient's oral  cavity and oropharynx were irrigated and suctioned.  Again, there was no  active bleeding.  The Crowe-Davis mouth gag was released and removed.  There  were no loose or broken teeth.  The patient was  awakened from her  anesthetic, extubated and was transferred from the operating room to the  recovery room in stable condition.  There were no complications, and blood  loss was minimal.                                               Onalee Hua Barnes. Annalee Genta, M.D.    DLS/MEDQ  D:  46/96/2952  T:  09/21/2002  Job:  [id number]

## 2011-04-27 NOTE — Consult Note (Signed)
[facility]                            ENDOCRINOLOGY CONSULTATION   NAME:Kim Barnes, Kim Barnes                       MRN:          [id number]  DATE:10/08/2006                            DOB:          [birth year]    REASON FOR VISIT:  Abdominal swelling and pain.   HISTORY OF PRESENT ILLNESS:  35 year old woman with two days of  moderate generalized abdominal swelling and some slight associated pain.   Regarding her hypokalemia, she inconsistently takes her potassium chloride.   Regarding her hypertension, she takes Normodyne and Lotensin.   PAST MEDICAL HISTORY:  She went off Depo-Provera earlier this year.  Her  last menstrual period was September of 2007.  She states that she thinks  pregnancy would be unlikely, but she is at risk for pregnancy.   REVIEW OF SYSTEMS:  Denies the following:  Fever, nausea, vomiting, rectal  bleeding, hematuria and diarrhea.   PHYSICAL EXAMINATION:  VITAL SIGNS:  Blood pressure 160/104, heart rate 80,  temperature 97.5, weight is 330.  GENERAL APPEARANCE:  She is obese.  No distress.  ABDOMEN:  Soft, nontender.  No hepatosplenomegaly.  No mass.  Obesity limits  the examination.  EXTREMITIES:  No edema.   LABORATORY DATA:  Urinalysis is normal except for blood.  Urine pregnancy  test is negative.   IMPRESSION:  1. Abdominal pain and hematuria of uncertain etiology.  2. History of hypokalemia.  3. Hypertension, inadequately controlled.   PLAN:  1. Check abdominal ultrasound, liver function tests, amylase and CBC.  2. Check potassium.  3. See Dr. Posey Rea soon to followup these results and reconsider      antihypertensive therapy.      It appears not only that she needs more aggressive therapy, but she      needs a change in her therapy given her risk for pregnancy.    ______________________________  Kim Dunker Everardo All, MD    SAE/MedQ  DD: 10/10/2006  DT: 10/10/2006  Job #: [id number]   cc:   Georgina Quint. Plotnikov, MD  Duke Salvia. Marcelle Overlie, M.D.

## 2011-04-27 NOTE — H&P (Signed)
NAME:  Guerrero, Kim Barnes                          ACCOUNT NO.:  [id number]   MEDICAL RECORD NO.:  [id number]                   PATIENT TYPE:  AMB   LOCATION:  SDC                                  [facility]ILITY[facility]  [facility]   PHYSICIAN:  Duke Salvia. Marcelle Overlie, M.D.            DATE OF BIRTH:  04/09/1976   DATE OF ADMISSION:  DATE OF DISCHARGE:                                HISTORY & PHYSICAL   DATE OF SCHEDULED SURGERY:  January 07, 2004   CHIEF COMPLAINT:  Abnormal Pap smear.   HISTORY OF PRESENT ILLNESS:  A 35 year old G2 P0 currently using Mircette  for contraception.  Was noted to have high-grade dysplasia on Pap dated  December 2004.  She subsequently underwent colposcopy with a biopsy in our  office which was satisfactory although difficult due to her weight of 341.   Biopsies returned CIN 2.  Because of the need for better exposure we have  elected to do outpatient LEEP.  This procedure including risks of bleeding,  infection, and her expected recovery time all reviewed with her which she  understands and accepts.   PAST MEDICAL HISTORY:  Allergies:  Kim Barnes.   FAMILY HISTORY:  Both parents are healthy.   REVIEW OF SYSTEMS:  Significant for hypertension and obesity.  Her medical  doctor is Dr. Posey Rea.  She is currently not on any blood pressure  medicines.  Her only current medicine is Mircette.   PHYSICAL EXAMINATION:  VITAL SIGNS:  Temperature 98.2, blood pressure  130/88.  HEENT:  Unremarkable.  NECK:  Supple without masses.  LUNGS:  Clear.  CARDIOVASCULAR:  Regular rate and rhythm without murmurs, rubs, or gallops  noted.  BREASTS:  Without masses.  ABDOMEN:  Soft, flat, and nontender.  PELVIC:  Normal externa genitalia, vagina and cervix clear.  Uterus  midposition and normal size. Adnexa negative.  EXTREMITIES AND NEUROLOGIC:  Unremarkable.   IMPRESSION:  Moderate cervical dysplasia.   PLAN:  LEEP.  Procedure and risks reviewed as above.                Richard M. Marcelle Overlie, M.D.    RMH/MEDQ  D:  01/06/2004  T:  01/06/2004  Job:  [id number]

## 2011-08-15 ENCOUNTER — Other Ambulatory Visit: Payer: Self-pay | Admitting: Internal Medicine

## 2011-08-16 ENCOUNTER — Telehealth: Payer: Self-pay | Admitting: *Deleted

## 2011-08-16 MED ORDER — ALBUTEROL SULFATE HFA 108 (90 BASE) MCG/ACT IN AERS: 2.0000 | INHALATION_SPRAY | Freq: Four times a day (QID) | RESPIRATORY_TRACT | Status: DC | PRN

## 2011-08-16 NOTE — Telephone Encounter (Signed)
OK to fill this prescription with additional refills x5 Thank you!  

## 2011-08-16 NOTE — Telephone Encounter (Signed)
Requested Medications   Med is not active in chart. Ok to rf?     PROAIR HFA 108 (90 BASE) MCG/ACT inhaler [Pharmacy Med Name: PROAIR HFA 90 MCG INHALER]   INHALE 2 PUFFS EVERY 4 HOURS AS NEEDED FOR SHORTNESS OF BREATH   Disp: Not specified (Pharmacy request: 8.5) R: 0 Start: 08/15/2011  Class: Normal   Last refill: 05/08/2010

## 2011-08-16 NOTE — Telephone Encounter (Signed)
Sent in

## 2011-08-27 ENCOUNTER — Ambulatory Visit (INDEPENDENT_AMBULATORY_CARE_PROVIDER_SITE_OTHER): Payer: BC Managed Care – PPO | Admitting: Internal Medicine

## 2011-08-27 ENCOUNTER — Encounter: Payer: Self-pay | Admitting: Internal Medicine

## 2011-08-27 VITALS — BP 128/88 | HR 89 | Temp 98.6°F | Wt 378.0 lb

## 2011-08-27 DIAGNOSIS — I10 Essential (primary) hypertension: Secondary | ICD-10-CM

## 2011-08-27 DIAGNOSIS — R7309 Other abnormal glucose: Secondary | ICD-10-CM

## 2011-08-27 DIAGNOSIS — J45901 Unspecified asthma with (acute) exacerbation: Secondary | ICD-10-CM

## 2011-08-27 DIAGNOSIS — R609 Edema, unspecified: Secondary | ICD-10-CM

## 2011-08-27 MED ORDER — MONTELUKAST SODIUM 10 MG PO TABS: 10.0000 mg | ORAL_TABLET | Freq: Every day | ORAL | Status: DC

## 2011-08-27 MED ORDER — PREDNISONE 10 MG PO TABS: ORAL_TABLET | ORAL | Status: DC

## 2011-08-27 MED ORDER — METHYLPREDNISOLONE ACETATE 80 MG/ML IJ SUSP
120.0000 mg | Freq: Once | INTRAMUSCULAR | Status: AC
  Administered 2011-08-27: 120 mg via INTRAMUSCULAR

## 2011-08-27 NOTE — Assessment & Plan Note (Signed)
stable overall by hx and exam, most recent data reviewed with pt, and pt to continue medical treatment as before  BP Readings from Last 3 Encounters:  08/27/11 128/88  04/20/11 134/98  01/31/11 144/92

## 2011-08-27 NOTE — Assessment & Plan Note (Signed)
Some mild impair glu tolerance in the past, pt asympt, to follow on steroid tx,  to f/u any worsening symptoms or concerns

## 2011-08-27 NOTE — Assessment & Plan Note (Signed)
Mild overall, no evidence for infection today, ? Seasonal per pt - for depomedrol IM, and predpack for home,  to f/u any worsening symptoms or concerns, add singulair for more long term control

## 2011-08-27 NOTE — Assessment & Plan Note (Signed)
stable overall by hx and exam, most recent data reviewed with pt, and pt to continue medical treatment as before  Lab Results  Component Value Date   WBC 7.9 04/20/2011   HGB 13.3 04/20/2011   HCT 39.4 04/20/2011   PLT 269.0 04/20/2011   CHOL 225* 05/24/2010   TRIG 72.0 05/24/2010   HDL 54.40 05/24/2010   LDLDIRECT 149.4 05/24/2010   ALT 22 04/20/2011   AST 22 04/20/2011   NA 141 04/20/2011   K 3.9 04/20/2011   CL 104 04/20/2011   CREATININE 0.8 04/20/2011   BUN 9 04/20/2011   CO2 30 04/20/2011   TSH 1.75 04/20/2011   HGBA1C 6.0 04/20/2011

## 2011-08-27 NOTE — Progress Notes (Signed)
Subjective:    Patient ID: Kim Barnes, female    DOB: June 22, 1976, 35 y.o.   MRN: [id number]  HPI  Here to c/o 3 days onset cough with mild wheezing, intermittent, only with certain environs such as being in her home or at work, but not outside.  Pt denies fever, wt loss, night sweats, loss of appetite, or other constitutional symptoms  Pt denies chest pain, increased sob or doe, wheezing, orthopnea, PND, increased LE swelling, palpitations, dizziness or syncope, except for above.  Pt denies new neurological symptoms such as new headache, or facial or extremity weakness or numbness   Pt denies polydipsia, polyuria, Pt states overall good compliance with meds, trying to follow lower cholesterol, diabetic diet, wt overall stable but little exercise however.    Has long hx of asthma, having been on controllers in the past such as symbicort.  Proair helps but doesnot hold, and has been awening at night over the weekend as well with sob/wheezing. Past Medical History  Diagnosis Date  . HTN (hypertension)   . Obesity   . Amenorrhea     Dr Marcelle Overlie  . Asthma   . Depression   . Chronic cough     onset 04/2010 > resolved 8.9.2011 on max gerd rx and off ICS/on bystolic  . GERD (gastroesophageal reflux disease)   . Brain tumor   . Chronic cough     onset 5-11 > resolved 07-18-10 on max gerd rx and off ICS/ on bystolic   Past Surgical History  Procedure Date  . Brain surgery 1996    Had tumor removed    reports that she has never smoked. She does not have any smokeless tobacco history on file. She reports that she drinks alcohol. Her drug history not on file. family history includes Asthma in her maternal aunt and mother; Breast cancer in her maternal grandmother; Cancer in her father and maternal grandmother; Emphysema in her paternal grandmother; Hypertension in her other; and Lung cancer in her father. Allergies  Allergen Reactions  . Cephalexin    Current Outpatient Prescriptions on File Prior  to Visit  Medication Sig Dispense Refill  . albuterol (PROAIR HFA) 108 (90 BASE) MCG/ACT inhaler Inhale 2 puffs into the lungs every 6 (six) hours as needed for wheezing.  3 Inhaler  1  . Cholecalciferol 1000 UNITS tablet Take 1,000 Units by mouth daily.        [name] esomeprazole (NEXIUM) 40 MG capsule Take 40 mg by mouth daily before breakfast.        . famotidine (PEPCID) 20 MG tablet Take 20 mg by mouth at bedtime as needed.        . furosemide (LASIX) 40 MG tablet Take 1 tablet (40 mg total) by mouth daily. 30 to 60 minutes before first meal of the day  90 tablet  3  . metFORMIN (GLUCOPHAGE) 500 MG tablet Take 1 tablet (500 mg total) by mouth daily with breakfast.  30 tablet  11  . Nebivolol HCl (BYSTOLIC) 20 MG TABS Take 1 tablet by mouth daily.        . potassium chloride (KLOR-CON) 10 MEQ CR tablet Take 10 mEq by mouth daily.        . traMADol (ULTRAM) 50 MG tablet Take 50-100 mg by mouth every 6 (six) hours as needed. For cough        No current facility-administered medications on file prior to visit.   Review of Systems Review of Systems  Constitutional: Negative  for diaphoresis and unexpected weight change.  HENT: Negative for drooling and tinnitus.   Eyes: Negative for photophobia and visual disturbance.  Respiratory: Negative for choking and stridor.   Gastrointestinal: Negative for vomiting and blood in stool.  Genitourinary: Negative for hematuria and decreased urine volume.    Objective:   Physical Exam BP 128/88  Pulse 89  Temp(Src) 98.6 F (37 C) (Oral)  Wt 378 lb (171.46 kg)  SpO2 94% Physical Exam  VS noted, not ill appearing Constitutional: Pt appears well-developed and well-nourished.  HENT: Head: Normocephalic.  Right Ear: External ear normal.  Left Ear: External ear normal.  Bilat tm's mild erythema.  Sinus nontender.  Pharynx mild erythema Eyes: Conjunctivae and EOM are normal. Pupils are equal, round, and reactive to light.  Neck: Normal range of motion.  Neck supple.  Cardiovascular: Normal rate and regular rhythm.   Pulmonary/Chest: Effort normal and breath sounds decreased with mild bilat wheeze  Neurological: Pt is alert. No cranial nerve deficit.  Skin: Skin is warm. No erythema.  Psychiatric: Pt behavior is normal. Thought content normal.     Assessment & Plan:

## 2011-08-27 NOTE — Patient Instructions (Signed)
You had the steroid shot today Take all new medications as prescribed - the prednisone (short term), and singulair Continue all other medications as before

## 2011-09-03 ENCOUNTER — Telehealth: Payer: Self-pay | Admitting: *Deleted

## 2011-09-03 MED ORDER — PROMETHAZINE-CODEINE 6.25-10 MG/5ML PO SYRP: 5.0000 mL | ORAL_SOLUTION | ORAL | Status: DC | PRN

## 2011-09-03 NOTE — Telephone Encounter (Signed)
Pt saw Dr Jonny Ruiz last Monday. Pt states he coughing hasn't gotten any better. She has even tried delsym to suppress her cough with no relief. Pt states she is coughing so much that when she cough he chest and abdomin are sore. Pt would like to know what you advise to help suppress her cough. Call pt on cell [phone]

## 2011-09-03 NOTE — Telephone Encounter (Signed)
Try Prom/cod syrup Thx

## 2011-09-04 MED ORDER — PROMETHAZINE-CODEINE 6.25-10 MG/5ML PO SYRP: 5.0000 mL | ORAL_SOLUTION | ORAL | Status: DC | PRN

## 2011-09-04 NOTE — Telephone Encounter (Signed)
Pt informed medication was called into [facility] on [address]

## 2011-09-10 ENCOUNTER — Ambulatory Visit (INDEPENDENT_AMBULATORY_CARE_PROVIDER_SITE_OTHER): Payer: BC Managed Care – PPO | Admitting: Internal Medicine

## 2011-09-10 ENCOUNTER — Encounter: Payer: Self-pay | Admitting: Internal Medicine

## 2011-09-10 VITALS — BP 142/110 | HR 85 | Temp 98.8°F | Ht 66.0 in | Wt 380.4 lb

## 2011-09-10 DIAGNOSIS — R05 Cough: Secondary | ICD-10-CM

## 2011-09-10 DIAGNOSIS — R059 Cough, unspecified: Secondary | ICD-10-CM

## 2011-09-10 MED ORDER — ESOMEPRAZOLE MAGNESIUM 40 MG PO CPDR: 40.0000 mg | DELAYED_RELEASE_CAPSULE | Freq: Every day | ORAL | Status: DC

## 2011-09-10 MED ORDER — BENZONATATE 200 MG PO CAPS: 200.0000 mg | ORAL_CAPSULE | Freq: Three times a day (TID) | ORAL | Status: AC | PRN

## 2011-09-10 MED ORDER — TRAMADOL HCL 50 MG PO TABS: 50.0000 mg | ORAL_TABLET | Freq: Four times a day (QID) | ORAL | Status: DC | PRN

## 2011-09-10 NOTE — Progress Notes (Signed)
  Subjective:    Patient ID: Kim Barnes, female    DOB: Aug 22, 1976, 35 y.o.   MRN: [id number]  HPI complains of continued cough Cough is nonproductive - "very dry" Seen 2 weeks ago by [facility] for same - tx asthma exac with depo med IM, pred pak and singular - also codiene cough med ineffective Hx same spring 2011 - seen by pulm for same exac by change in season and recent URI  Past Medical History  Diagnosis Date  . HTN (hypertension)   . Obesity   . Amenorrhea     Dr Marcelle Overlie  . Asthma   . Depression   . Chronic cough     onset 04/2010 > resolved 8.9.2011 on max gerd rx and off ICS/on bystolic  . GERD (gastroesophageal reflux disease)   . Brain tumor   . Chronic cough     onset 5-11 > resolved 07-18-10 on max gerd rx and off ICS/ on bystolic    Review of Systems  Constitutional: Negative for fever, chills and fatigue.  Respiratory: Positive for cough, choking and chest tightness. Negative for stridor.   Cardiovascular: Negative for chest pain and palpitations.       Objective:   Physical Exam BP 142/110  Pulse 85  Temp(Src) 98.8 F (37.1 C) (Oral)  Ht 5\' 6"  (1.676 m)  Wt 380 lb 6.4 oz (172.548 kg)  BMI 61.40 kg/m2  SpO2 99% Wt Readings from Last 3 Encounters:  09/10/11 380 lb 6.4 oz (172.548 kg)  08/27/11 378 lb (171.46 kg)  04/20/11 376 lb (170.552 kg)   Constitutional: She is obese, but appears well-developed and well-nourished. No distress.  HENT: Head: Normocephalic and atraumatic. Ears: B TMs ok, no erythema or effusion; Nose: Nose normal.  Mouth/Throat: Oropharynx is clear and moist. No oropharyngeal exudate.  Eyes: Conjunctivae and EOM are normal. Pupils are equal, round, and reactive to light. No scleral icterus.  Neck: Normal range of motion. Neck supple. No JVD present. No thyromegaly present.  Cardiovascular: Normal rate, regular rhythm and normal heart sounds.  No murmur heard. No BLE edema. Pulmonary/Chest: Effort normal and breath sounds normal. No  respiratory distress. She has no wheezes.  Skin: Skin is warm and dry. No rash noted. No erythema.  Psychiatric: She has a normal mood and affect. Her behavior is normal. Judgment and thought content normal.   Lab Results  Component Value Date   WBC 7.9 04/20/2011   HGB 13.3 04/20/2011   HCT 39.4 04/20/2011   PLT 269.0 04/20/2011   CHOL 225* 05/24/2010   TRIG 72.0 05/24/2010   HDL 54.40 05/24/2010   LDLDIRECT 149.4 05/24/2010   ALT 22 04/20/2011   AST 22 04/20/2011   NA 141 04/20/2011   K 3.9 04/20/2011   CL 104 04/20/2011   CREATININE 0.8 04/20/2011   BUN 9 04/20/2011   CO2 30 04/20/2011   TSH 1.75 04/20/2011   HGBA1C 6.0 04/20/2011       Assessment & Plan:  Cough - suspect VCD - hx same - seen by Sherene Sires 2011 for same - VSS, O2 ok - resume PPI, tramadol and tessalon - continue Alb MDI prn but no evidence for active asthma or infection at this time

## 2011-09-10 NOTE — Patient Instructions (Addendum)
It was good to see you today. If you develop worsening symptoms or fever, call and we can reconsider antibiotics, but it does not appear necessary to use antibiotics at this time. Continue cough syrup at night as ongoing  Also add: tessalon pearls 200 mg each night before bed, and can use every 6hrs during day if needed.  Can stop after 2 weeks if doing well; nexium 40mg  one before dinner for 2 weeks only.  hard candy to bathe back of throat during day, no throat clearing  limit voice use, no singing for next 2 weeks. Stop singular

## 2011-10-22 ENCOUNTER — Ambulatory Visit (INDEPENDENT_AMBULATORY_CARE_PROVIDER_SITE_OTHER): Payer: BC Managed Care – PPO | Admitting: Internal Medicine

## 2011-10-22 ENCOUNTER — Encounter: Payer: Self-pay | Admitting: Internal Medicine

## 2011-10-22 VITALS — BP 168/102 | HR 84 | Temp 98.5°F | Resp 16 | Wt 375.0 lb

## 2011-10-22 DIAGNOSIS — R059 Cough, unspecified: Secondary | ICD-10-CM

## 2011-10-22 DIAGNOSIS — J45901 Unspecified asthma with (acute) exacerbation: Secondary | ICD-10-CM

## 2011-10-22 DIAGNOSIS — R05 Cough: Secondary | ICD-10-CM

## 2011-10-22 MED ORDER — TIOTROPIUM BROMIDE MONOHYDRATE 18 MCG IN CAPS: 18.0000 ug | ORAL_CAPSULE | Freq: Every day | RESPIRATORY_TRACT | Status: DC

## 2011-10-22 MED ORDER — AZITHROMYCIN 250 MG PO TABS: ORAL_TABLET | ORAL | Status: AC

## 2011-10-22 MED ORDER — MOMETASONE FURO-FORMOTEROL FUM 200-5 MCG/ACT IN AERO: 2.0000 | INHALATION_SPRAY | Freq: Two times a day (BID) | RESPIRATORY_TRACT | Status: DC

## 2011-10-22 NOTE — Progress Notes (Signed)
  Subjective:    Patient ID: Kim Barnes, female    DOB: 1976-01-05, 35 y.o.   MRN: [id number]  HPI  C/o several weeks of cough - dry, was productive up to last week  Review of Systems  Constitutional: Negative for chills, activity change, appetite change, fatigue and unexpected weight change.  HENT: Negative for congestion, mouth sores and sinus pressure.   Eyes: Negative for visual disturbance.  Respiratory: Positive for cough and wheezing. Negative for chest tightness and shortness of breath.   Gastrointestinal: Negative for nausea and abdominal pain.  Genitourinary: Negative for frequency, difficulty urinating and vaginal pain.  Musculoskeletal: Negative for back pain and gait problem.  Skin: Negative for pallor and rash.  Neurological: Negative for dizziness, tremors, weakness, numbness and headaches.  Psychiatric/Behavioral: Negative for confusion and sleep disturbance.       Objective:   Physical Exam  Constitutional: She appears well-developed. No distress.       obese  HENT:  Head: Normocephalic.  Right Ear: External ear normal.  Left Ear: External ear normal.  Nose: Nose normal.  Mouth/Throat: Oropharynx is clear and moist.  Eyes: Conjunctivae are normal. Pupils are equal, round, and reactive to light. Right eye exhibits no discharge. Left eye exhibits no discharge.  Neck: Normal range of motion. Neck supple. No JVD present. No tracheal deviation present. No thyromegaly present.  Cardiovascular: Normal rate, regular rhythm and normal heart sounds.   Pulmonary/Chest: No stridor. No respiratory distress. She has no wheezes.  Abdominal: Soft. Bowel sounds are normal. She exhibits no distension and no mass. There is no tenderness. There is no rebound and no guarding.  Musculoskeletal: She exhibits no edema and no tenderness.  Lymphadenopathy:    She has no cervical adenopathy.  Neurological: She displays normal reflexes. No cranial nerve deficit. She exhibits normal  muscle tone. Coordination normal.  Skin: No rash noted. No erythema.  Psychiatric: She has a normal mood and affect. Her behavior is normal. Judgment and thought content normal.          Assessment & Plan:

## 2011-10-22 NOTE — Patient Instructions (Signed)
Call if not well in 2 weeks

## 2011-11-04 ENCOUNTER — Encounter: Payer: Self-pay | Admitting: Internal Medicine

## 2011-11-04 NOTE — Assessment & Plan Note (Signed)
See Meds 

## 2011-11-04 NOTE — Assessment & Plan Note (Signed)
11/12 -- x 2 months See meds

## 2011-11-29 ENCOUNTER — Encounter (HOSPITAL_COMMUNITY): Payer: Self-pay | Admitting: Pharmacist

## 2011-11-29 NOTE — H&P (Signed)
Kim Barnes, Kim Barnes                ACCOUNT NO.:  [id number]  MEDICAL RECORD NO.:  [id number]  LOCATION:                                 FACILITY:  PHYSICIAN:  Duke Salvia. Marcelle Overlie, M.D.    DATE OF BIRTH:  DATE OF ADMISSION:  1976/10/26 DATE OF DISCHARGE:                             HISTORY & PHYSICAL   DATE OF OUTPATIENT SURGERY SCHEDULED:  December 17, 2011, at [facility].  CHIEF COMPLAINT:  Abnormal bleeding, endometrial polyps.  HISTORY OF PRESENT ILLNESS:  A 35 year old G2, P0 with a history of PCOS who has been followed by Dr. Talmage Nap in the past for an elevated fasting insulin, briefly took metformin but is currently not on that medication. She has problems with obesity.  Her current weight is 373.  She did have a prior D&C, hysteroscopy for endometrial polyps in 2007, but more recently, has experienced problems with heavy bleeding that did not respond to OCPs.  Followup ultrasound with saline infusion on November 19, 2011, showed thickened endometrium with a 2.3 cm intramural fibroid, adnexa negative, thickened irregular endometrium with possible endometrial polyps.  She presents now for [facility] and hysteroscopy.  This procedure including risks related to bleeding, infection, other complications may require additional surgery, all reviewed with her which she understands and accepts.  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  OTHER MEDICATIONS:  Currently taking OCPs b.i.d. to regulate her bleeding and also Bystolic for hypertension.  PRIOR SURGERIES:  She has had D&C, hysteroscopy.  She has had neurosurgical procedure for benign lesion by Dr. Channing Mutters in 1996.  REVIEW OF SYSTEMS:  Otherwise unremarkable.  FAMILY HISTORY:  Negative.  SOCIAL HISTORY:  Denies alcohol, tobacco, or drug use.  She is single.  MEDICAL DOCTOR:  Georgina Quint. Plotnikov, MD  PHYSICAL EXAM:  VITAL SIGNS: Temp 98.2, blood pressure 130/86. HEENT:  Unremarkable. NECK: Supple without masses. LUNGS: Clear. CARDIOVASCULAR: Regular rate and rhythm without murmurs, rubs, gallops. CHEST: Breasts without masses. ABDOMEN:  Soft, flat, and nontender. PELVIC: Vulva, vagina, and cervix are unremarkable.  Most recent Pap was normal.  Uterus midposition.  Adnexa negative, exam difficult due to her weight. EXTREMITIES/NEUROLOGIC: Unremarkable.  IMPRESSION:  Abnormal uterine bleeding, history of history of polycystic ovarian syndrome with endometrial polyps on recent sonohysterogram.  PLAN:  D&C and hysteroscopy.  Procedure and risks discussed as above.     Anvay Tennis M. Marcelle Overlie, M.D.     [facility]/MEDQ  D:  11/28/2011  T:  11/29/2011  Job:  [id number]

## 2011-12-10 ENCOUNTER — Other Ambulatory Visit: Payer: Self-pay

## 2011-12-10 ENCOUNTER — Encounter (HOSPITAL_COMMUNITY)
Admission: RE | Admit: 2011-12-10 | Discharge: 2011-12-10 | Disposition: A | Discharge status: Home / Self Care | Payer: BC Managed Care – PPO | Source: Ambulatory Visit | Attending: Obstetrics and Gynecology | Admitting: Obstetrics and Gynecology

## 2011-12-10 ENCOUNTER — Encounter (HOSPITAL_COMMUNITY): Payer: Self-pay

## 2011-12-10 LAB — CBC
MCV: 89.9 fL (ref 78.0–100.0)
Platelets: 334 10*3/uL (ref 150–400)
RBC: 3.86 MIL/uL — ABNORMAL LOW (ref 3.87–5.11)
WBC: 7.4 10*3/uL (ref 4.0–10.5)

## 2011-12-10 NOTE — Pre-Procedure Instructions (Signed)
EKG done and ok'd by Brayton Caves, MD. No orders given.

## 2011-12-10 NOTE — Patient Instructions (Addendum)
20 Kim Barnes  12/10/2011   Your procedure is scheduled on:  12/17/11 1:00  Enter through the Main Entrance of [facility] at 1130 AM.  Pick up the phone at the desk and dial [phone].   Call this number if you have problems the morning of surgery: [phone]   Remember:   Do not eat food:After Midnight.  Do not drink clear liquids: 4 Hours before arrival.  Take these medicines the morning of surgery with A SIP OF WATER: Bystolic   Do not wear jewelry, make-up or nail polish.  Do not wear lotions, powders, or perfumes. You may wear deodorant.  Do not shave 48 hours prior to surgery.  Do not bring valuables to the hospital.  Contacts, dentures or bridgework may not be worn into surgery.  Leave suitcase in the car. After surgery it may be brought to your room.  For patients admitted to the hospital, checkout time is 11:00 AM the day of discharge.   Patients discharged the day of surgery will not be allowed to drive home.  Name and phone number of your driver: see history  Special Instructions: CHG Shower Use Special Wash: 1/2 bottle night before surgery and 1/2 bottle morning of surgery.  Bring Inhaler day of surgery.   Please read over the following fact sheets that you were given: Surgical Site Infection Prevention

## 2011-12-17 ENCOUNTER — Ambulatory Visit (HOSPITAL_COMMUNITY)
Admission: RE | Admit: 2011-12-17 | Discharge: 2011-12-17 | Disposition: A | Discharge status: Home / Self Care | Payer: BC Managed Care – PPO | Source: Ambulatory Visit | Attending: Obstetrics and Gynecology | Admitting: Obstetrics and Gynecology

## 2011-12-17 ENCOUNTER — Encounter (HOSPITAL_COMMUNITY): Payer: Self-pay | Admitting: Anesthesiology

## 2011-12-17 ENCOUNTER — Encounter (HOSPITAL_COMMUNITY): Payer: Self-pay | Admitting: *Deleted

## 2011-12-17 ENCOUNTER — Ambulatory Visit (HOSPITAL_COMMUNITY): Payer: BC Managed Care – PPO | Admitting: Anesthesiology

## 2011-12-17 ENCOUNTER — Other Ambulatory Visit: Payer: Self-pay | Admitting: Obstetrics and Gynecology

## 2011-12-17 ENCOUNTER — Encounter (HOSPITAL_COMMUNITY)
Admission: RE | Disposition: A | Discharge status: Home / Self Care | Payer: Self-pay | Source: Ambulatory Visit | Attending: Obstetrics and Gynecology

## 2011-12-17 DIAGNOSIS — N949 Unspecified condition associated with female genital organs and menstrual cycle: Secondary | ICD-10-CM | POA: Insufficient documentation

## 2011-12-17 DIAGNOSIS — N938 Other specified abnormal uterine and vaginal bleeding: Secondary | ICD-10-CM | POA: Insufficient documentation

## 2011-12-17 DIAGNOSIS — R9389 Abnormal findings on diagnostic imaging of other specified body structures: Secondary | ICD-10-CM | POA: Insufficient documentation

## 2011-12-17 HISTORY — PX: HYSTEROSCOPY WITH D & C: SHX1775

## 2011-12-17 LAB — PREGNANCY, URINE: Preg Test, Ur: NEGATIVE

## 2011-12-17 SURGERY — DILATATION AND CURETTAGE /HYSTEROSCOPY
Anesthesia: General | Site: Vagina | Wound class: Clean Contaminated

## 2011-12-17 MED ORDER — GLYCINE 1.5 % IR SOLN
Status: DC | PRN
  Administered 2011-12-17: 3000 mL

## 2011-12-17 MED ORDER — ONDANSETRON HCL 4 MG/2ML IJ SOLN
INTRAMUSCULAR | Status: DC | PRN
  Administered 2011-12-17: 4 mg via INTRAVENOUS

## 2011-12-17 MED ORDER — PANTOPRAZOLE SODIUM 40 MG PO TBEC
DELAYED_RELEASE_TABLET | ORAL | Status: AC
  Administered 2011-12-17: 40 mg via ORAL
  Filled 2011-12-17: qty 1

## 2011-12-17 MED ORDER — NORETHINDRONE ACET-ETHINYL EST 1-20 MG-MCG PO TABS: 1.0000 | ORAL_TABLET | Freq: Every day | ORAL | Status: DC

## 2011-12-17 MED ORDER — LIDOCAINE HCL (CARDIAC) 20 MG/ML IV SOLN
INTRAVENOUS | Status: DC | PRN
  Administered 2011-12-17: 80 mg via INTRAVENOUS

## 2011-12-17 MED ORDER — HYDROCODONE-IBUPROFEN 7.5-200 MG PO TABS: 1.0000 | ORAL_TABLET | Freq: Three times a day (TID) | ORAL | Status: AC | PRN

## 2011-12-17 MED ORDER — FENTANYL CITRATE 0.05 MG/ML IJ SOLN
INTRAMUSCULAR | Status: DC | PRN
  Administered 2011-12-17: 100 ug via INTRAVENOUS

## 2011-12-17 MED ORDER — LACTATED RINGERS IV SOLN
INTRAVENOUS | Status: DC
  Administered 2011-12-17 (×2): via INTRAVENOUS
  Administered 2011-12-17: 100 mL/h via INTRAVENOUS

## 2011-12-17 MED ORDER — LIDOCAINE HCL 1 % IJ SOLN
INTRAMUSCULAR | Status: DC | PRN
  Administered 2011-12-17: 10 mL

## 2011-12-17 MED ORDER — PANTOPRAZOLE SODIUM 40 MG PO TBEC
40.0000 mg | DELAYED_RELEASE_TABLET | Freq: Once | ORAL | Status: AC
  Administered 2011-12-17: 40 mg via ORAL

## 2011-12-17 MED ORDER — PROPOFOL 10 MG/ML IV EMUL
INTRAVENOUS | Status: DC | PRN
  Administered 2011-12-17: 250 mg via INTRAVENOUS

## 2011-12-17 MED ORDER — MIDAZOLAM HCL 5 MG/5ML IJ SOLN
INTRAMUSCULAR | Status: DC | PRN
  Administered 2011-12-17: 2 mg via INTRAVENOUS

## 2011-12-17 MED ORDER — KETOROLAC TROMETHAMINE 30 MG/ML IJ SOLN
INTRAMUSCULAR | Status: DC | PRN
  Administered 2011-12-17: 30 mg via INTRAVENOUS

## 2011-12-17 SURGICAL SUPPLY — 13 items
CANISTER SUCTION 2500CC (MISCELLANEOUS) ×2 IMPLANT
CATH ROBINSON RED A/P 16FR (CATHETERS) ×2 IMPLANT
CLOTH BEACON ORANGE TIMEOUT ST (SAFETY) ×2 IMPLANT
CONTAINER PREFILL 10% NBF 60ML (FORM) ×4 IMPLANT
ELECT REM PT RETURN 9FT ADLT (ELECTROSURGICAL)
ELECTRODE REM PT RTRN 9FT ADLT (ELECTROSURGICAL) IMPLANT
GLOVE BIO SURGEON STRL SZ7 (GLOVE) ×4 IMPLANT
GOWN PREVENTION PLUS LG XLONG (DISPOSABLE) ×4 IMPLANT
GOWN STRL REIN XL XLG (GOWN DISPOSABLE) ×2 IMPLANT
LOOP ANGLED CUTTING 22FR (CUTTING LOOP) IMPLANT
PACK HYSTEROSCOPY LF (CUSTOM PROCEDURE TRAY) ×2 IMPLANT
TOWEL OR 17X24 6PK STRL BLUE (TOWEL DISPOSABLE) ×4 IMPLANT
WATER STERILE IRR 1000ML POUR (IV SOLUTION) ×2 IMPLANT

## 2011-12-17 NOTE — Brief Op Note (Signed)
12/17/2011  1:33 PM  PATIENT:  Kim Barnes  36 y.o. female  PRE-OPERATIVE DIAGNOSIS:  endometrial thickening  POST-OPERATIVE DIAGNOSIS:  endometrial thickening  PROCEDURE:  Procedure(s): DILATATION AND CURETTAGE /HYSTEROSCOPY  SURGEON:  Surgeon(s): Meriel Pica, MD  PHYSICIAN ASSISTANT:   ASSISTANTS: none   ANESTHESIA:   general  EBL:  Total I/O In: 1000 [I.V.:1000] Out: 130 [Urine:100; Blood:30]  BLOOD ADMINISTERED:none  DRAINS: none   LOCAL MEDICATIONS USED:  XYLOCAINE 10CC  SPECIMEN:  Source of Specimen:  endo curettings  DISPOSITION OF SPECIMEN:  PATHOLOGY  COUNTS:  YES  TOURNIQUET:  * No tourniquets in log *  DICTATION: .Dragon Dictation  PLAN OF CARE: Discharge to home after PACU  PATIENT DISPOSITION:  PACU - hemodynamically stable.   Delay start of Pharmacological VTE agent (>24hrs) due to surgical blood loss or risk of bleeding:  {YES/NO/NOT APPLICABLE:20182

## 2011-12-17 NOTE — Progress Notes (Signed)
The patient was re-examined with no change in status 

## 2011-12-17 NOTE — Transfer of Care (Signed)
Immediate Anesthesia Transfer of Care Note  Patient: Kim Barnes  Procedure(s) Performed:  DILATATION AND CURETTAGE /HYSTEROSCOPY  Patient Location: PACU  Anesthesia Type: General  Level of Consciousness: awake and patient cooperative  Airway & Oxygen Therapy: Patient connected to nasal cannula oxygen  Post-op Assessment: Report given to PACU RN  Post vital signs: stable  Complications: No apparent anesthesia complications

## 2011-12-17 NOTE — Brief Op Note (Signed)
12/17/2011  1:19 PM  PATIENT:  Kim Barnes  35 y.o. female  PRE-OPERATIVE DIAGNOSIS:  endometrial thickening  POST-OPERATIVE DIAGNOSIS:  * No post-op diagnosis entered *  PROCEDURE:  Procedure(s): DILATATION AND CURETTAGE /HYSTEROSCOPY  SURGEON:  Surgeon(s): Meriel Pica, MD  PHYSICIAN ASSISTANT:   ASSISTANTS: none   ANESTHESIA:   local and general  EBL:  Total I/O In: -  Out: 100 [Urine:100]  BLOOD ADMINISTERED:none  DRAINS: none   LOCAL MEDICATIONS USED:  XYLOCAINE 10CC  SPECIMEN:  Source of Specimen:  endometrial curettings  DISPOSITION OF SPECIMEN:  PATHOLOGY  COUNTS:  YES  TOURNIQUET:  * No tourniquets in log *  DICTATION: .Dragon Dictation  PLAN OF CARE: Discharge to home after PACU  PATIENT DISPOSITION:  PACU - hemodynamically stable.   Delay start of Pharmacological VTE agent (>24hrs) due to surgical blood loss or risk of bleeding:  {YES/NO/NOT APPLICABLE:20182  Preoperative diagnosis: Abnormal uterine bleeding, endometrial polyps  Postoperative diagnosis: Same  Procedure: Hysteroscopy, D&C  Surgeon: Marcelle Overlie  Anesthesia: Paracervical block plus general  EBL: Less than 50 cc  Specimens removed: Endometrial curettings to pathology  Complications: None  Procedure and findings:  Patient was taken to the operating room after an adequate level of general anesthesia was obtained the legs in stirrups the perineum and vagina were prepped and draped in the usual manner for D&C the bladder was drained he UA carried out uterus mid position normal size adnexa negative exam difficult due to her weight. Weighted speculum was positioned paracervical block was then created by infiltrating at 3 and 9:00 submucosally of 7 cc of 1% Xylocaine on either side after negative aspiration. The uterus was then sounded to 9 cm progressively dilated to a 27 Pratt dilator continuous flow hysteroscope was then used to explore the cavity there was some significant  endometrial buildup precluding adequate visualization. Sharp curettage was carried out a moderate amount of tissue was removed submitted to pathology the scope was reinserted and the cavity was irrigated no other abnormalities were noted at that point. She tolerated this well, received IV Toradol in the case and went to PACU in good condition Dictated with dragon medical Fount Bahe M. Milana Obey.D.

## 2011-12-17 NOTE — Anesthesia Postprocedure Evaluation (Signed)
  Anesthesia Post-op Note  Patient: Kim Barnes  Procedure(s) Performed:  DILATATION AND CURETTAGE /HYSTEROSCOPY  Patient Location: PACU  Anesthesia Type: General  Level of Consciousness: awake, alert  and oriented  Airway and Oxygen Therapy: Patient Spontanous Breathing  Post-op Pain: none  Post-op Assessment: Post-op Vital signs reviewed, Patient's Cardiovascular Status Stable, Respiratory Function Stable, Patent Airway, No signs of Nausea or vomiting and Pain level controlled  Post-op Vital Signs: Reviewed and stable  Complications: No apparent anesthesia complications

## 2011-12-17 NOTE — Anesthesia Preprocedure Evaluation (Addendum)
Anesthesia Evaluation  Patient identified by MRN, date of birth, ID band Patient awake    Reviewed: Allergy & Precautions, H&P , NPO status , Patient's Chart, lab work & pertinent test results, reviewed documented beta blocker date and time   Airway Mallampati: II TM Distance: >3 FB Neck ROM: Full    Dental No notable dental hx. (+) Teeth Intact   Pulmonary asthma ,  Chronic cough clear to auscultation  Pulmonary exam normal       Cardiovascular hypertension, On Medications and On Home Beta Blockers regular Normal    Neuro/Psych PSYCHIATRIC DISORDERS Depression DepressionHx/o Brain Tumor S/P excision    GI/Hepatic Neg liver ROS, GERD-  Medicated and Controlled,  Endo/Other  Morbid obesityamenorrhea  Renal/GU negative Renal ROS  Genitourinary negative   Musculoskeletal   Abdominal Normal abdominal exam  (+)   Peds  Hematology negative hematology ROS (+)   Anesthesia Other Findings   Reproductive/Obstetrics negative OB ROS                         Anesthesia Physical Anesthesia Plan  ASA: III  Anesthesia Plan: General LMA   Post-op Pain Management:    Induction:   Airway Management Planned:   Additional Equipment:   Intra-op Plan:   Post-operative Plan:   Informed Consent: I have reviewed the patients History and Physical, chart, labs and discussed the procedure including the risks, benefits and alternatives for the proposed anesthesia with the patient or authorized representative who has indicated his/her understanding and acceptance.   Dental Advisory Given  Plan Discussed with: Anesthesiologist, CRNA and Surgeon  Anesthesia Plan Comments:         Anesthesia Quick Evaluation

## 2011-12-18 ENCOUNTER — Encounter (HOSPITAL_COMMUNITY): Payer: Self-pay | Admitting: Obstetrics and Gynecology

## 2013-01-10 DIAGNOSIS — G5602 Carpal tunnel syndrome, left upper limb: Secondary | ICD-10-CM

## 2013-01-10 HISTORY — DX: Carpal tunnel syndrome, left upper limb: G56.02

## 2013-02-02 ENCOUNTER — Other Ambulatory Visit: Payer: Self-pay | Admitting: Orthopedic Surgery

## 2013-02-03 ENCOUNTER — Encounter (HOSPITAL_BASED_OUTPATIENT_CLINIC_OR_DEPARTMENT_OTHER): Payer: Self-pay | Admitting: *Deleted

## 2013-02-03 NOTE — Progress Notes (Signed)
02/03/13 1117  OBSTRUCTIVE SLEEP APNEA  Have you ever been diagnosed with sleep apnea through a sleep study? No  Do you snore loudly (loud enough to be heard through closed doors)?  1  Do you often feel tired, fatigued, or sleepy during the daytime? 0  Has anyone observed you stop breathing during your sleep? 1  Do you have, or are you being treated for high blood pressure? 1  BMI more than 35 kg/m2? 1  Age over 35 years old? 0  Gender: 0  Obstructive Sleep Apnea Score 4  Score 4 or greater  Results sent to PCP

## 2013-02-03 NOTE — Pre-Procedure Instructions (Signed)
To come for BMET and EKG 

## 2013-02-04 NOTE — Progress Notes (Signed)
Chart reviewed by Dr Ivin Booty, ok for [facility].

## 2013-02-10 ENCOUNTER — Ambulatory Visit (HOSPITAL_BASED_OUTPATIENT_CLINIC_OR_DEPARTMENT_OTHER)
Admission: RE | Admit: 2013-02-10 | Discharge: 2013-02-10 | Disposition: A | Discharge status: Home / Self Care | Payer: 59 | Source: Ambulatory Visit | Attending: Orthopedic Surgery | Admitting: Orthopedic Surgery

## 2013-02-10 ENCOUNTER — Encounter (HOSPITAL_BASED_OUTPATIENT_CLINIC_OR_DEPARTMENT_OTHER): Payer: Self-pay | Admitting: *Deleted

## 2013-02-10 ENCOUNTER — Encounter (HOSPITAL_BASED_OUTPATIENT_CLINIC_OR_DEPARTMENT_OTHER): Payer: Self-pay | Admitting: Orthopedic Surgery

## 2013-02-10 ENCOUNTER — Encounter (HOSPITAL_BASED_OUTPATIENT_CLINIC_OR_DEPARTMENT_OTHER)
Admission: RE | Disposition: A | Discharge status: Home / Self Care | Payer: Self-pay | Source: Ambulatory Visit | Attending: Orthopedic Surgery

## 2013-02-10 ENCOUNTER — Ambulatory Visit (HOSPITAL_BASED_OUTPATIENT_CLINIC_OR_DEPARTMENT_OTHER): Payer: 59 | Admitting: *Deleted

## 2013-02-10 DIAGNOSIS — I10 Essential (primary) hypertension: Secondary | ICD-10-CM | POA: Insufficient documentation

## 2013-02-10 DIAGNOSIS — G5600 Carpal tunnel syndrome, unspecified upper limb: Secondary | ICD-10-CM | POA: Insufficient documentation

## 2013-02-10 DIAGNOSIS — J45909 Unspecified asthma, uncomplicated: Secondary | ICD-10-CM | POA: Insufficient documentation

## 2013-02-10 DIAGNOSIS — M19049 Primary osteoarthritis, unspecified hand: Secondary | ICD-10-CM | POA: Insufficient documentation

## 2013-02-10 HISTORY — DX: Polycystic ovarian syndrome: E28.2

## 2013-02-10 HISTORY — DX: Carpal tunnel syndrome, left upper limb: G56.02

## 2013-02-10 HISTORY — DX: Personal history of other specified conditions: Z87.898

## 2013-02-10 HISTORY — PX: CARPAL TUNNEL RELEASE: SHX101

## 2013-02-10 LAB — POCT I-STAT, CHEM 8
BUN: 6 mg/dL (ref 6–23)
Calcium, Ion: 1.16 mmol/L (ref 1.12–1.23)
Creatinine, Ser: 0.8 mg/dL (ref 0.50–1.10)
Hemoglobin: 14.6 g/dL (ref 12.0–15.0)
TCO2: 29 mmol/L (ref 0–100)

## 2013-02-10 SURGERY — CARPAL TUNNEL RELEASE
Anesthesia: General | Site: Wrist | Laterality: Left | Wound class: Clean

## 2013-02-10 MED ORDER — DEXAMETHASONE SODIUM PHOSPHATE 10 MG/ML IJ SOLN
INTRAMUSCULAR | Status: DC | PRN
  Administered 2013-02-10: 10 mg via INTRAVENOUS

## 2013-02-10 MED ORDER — MEPERIDINE HCL 25 MG/ML IJ SOLN: 6.2500 mg | INTRAMUSCULAR | Status: DC | PRN

## 2013-02-10 MED ORDER — MIDAZOLAM HCL 2 MG/ML PO SYRP: 12.0000 mg | ORAL_SOLUTION | Freq: Once | ORAL | Status: DC | PRN

## 2013-02-10 MED ORDER — CHLORHEXIDINE GLUCONATE 4 % EX LIQD: 60.0000 mL | Freq: Once | CUTANEOUS | Status: DC

## 2013-02-10 MED ORDER — ONDANSETRON HCL 4 MG/2ML IJ SOLN: 4.0000 mg | Freq: Once | INTRAMUSCULAR | Status: DC | PRN

## 2013-02-10 MED ORDER — CEFAZOLIN SODIUM-DEXTROSE 2-3 GM-% IV SOLR
2.0000 g | INTRAVENOUS | Status: AC
  Administered 2013-02-10: 2 g via INTRAVENOUS

## 2013-02-10 MED ORDER — OXYCODONE HCL 5 MG/5ML PO SOLN: 5.0000 mg | Freq: Once | ORAL | Status: DC | PRN

## 2013-02-10 MED ORDER — FENTANYL CITRATE 0.05 MG/ML IJ SOLN
INTRAMUSCULAR | Status: DC | PRN
  Administered 2013-02-10: 100 ug via INTRAVENOUS

## 2013-02-10 MED ORDER — OXYCODONE HCL 5 MG PO TABS: 5.0000 mg | ORAL_TABLET | Freq: Once | ORAL | Status: DC | PRN

## 2013-02-10 MED ORDER — MIDAZOLAM HCL 2 MG/2ML IJ SOLN: 1.0000 mg | INTRAMUSCULAR | Status: DC | PRN

## 2013-02-10 MED ORDER — HYDROMORPHONE HCL PF 1 MG/ML IJ SOLN
0.2500 mg | INTRAMUSCULAR | Status: DC | PRN
  Administered 2013-02-10: 0.5 mg via INTRAVENOUS

## 2013-02-10 MED ORDER — ONDANSETRON HCL 4 MG/2ML IJ SOLN
INTRAMUSCULAR | Status: DC | PRN
  Administered 2013-02-10: 4 mg via INTRAVENOUS

## 2013-02-10 MED ORDER — HYDROCODONE-ACETAMINOPHEN 5-325 MG PO TABS: 1.0000 | ORAL_TABLET | Freq: Four times a day (QID) | ORAL | Status: DC | PRN

## 2013-02-10 MED ORDER — MIDAZOLAM HCL 5 MG/5ML IJ SOLN
INTRAMUSCULAR | Status: DC | PRN
  Administered 2013-02-10: 2 mg via INTRAVENOUS

## 2013-02-10 MED ORDER — PROPOFOL 10 MG/ML IV BOLUS
INTRAVENOUS | Status: DC | PRN
  Administered 2013-02-10: 30 mg via INTRAVENOUS
  Administered 2013-02-10: 200 mg via INTRAVENOUS

## 2013-02-10 MED ORDER — LACTATED RINGERS IV SOLN
INTRAVENOUS | Status: DC
  Administered 2013-02-10: 09:00:00 via INTRAVENOUS

## 2013-02-10 MED ORDER — LIDOCAINE HCL (CARDIAC) 20 MG/ML IV SOLN
INTRAVENOUS | Status: DC | PRN
  Administered 2013-02-10: 100 mg via INTRAVENOUS

## 2013-02-10 MED ORDER — FENTANYL CITRATE 0.05 MG/ML IJ SOLN: 50.0000 ug | INTRAMUSCULAR | Status: DC | PRN

## 2013-02-10 MED ORDER — BUPIVACAINE HCL (PF) 0.25 % IJ SOLN
INTRAMUSCULAR | Status: DC | PRN
  Administered 2013-02-10: 10 mL

## 2013-02-10 SURGICAL SUPPLY — 36 items
BANDAGE GAUZE ELAST BULKY 4 IN (GAUZE/BANDAGES/DRESSINGS) ×2 IMPLANT
BLADE SURG 15 STRL LF DISP TIS (BLADE) ×1 IMPLANT
BLADE SURG 15 STRL SS (BLADE) ×1
BNDG COHESIVE 3X5 TAN STRL LF (GAUZE/BANDAGES/DRESSINGS) ×2 IMPLANT
BNDG ESMARK 4X9 LF (GAUZE/BANDAGES/DRESSINGS) IMPLANT
CHLORAPREP W/TINT 26ML (MISCELLANEOUS) ×2 IMPLANT
CLOTH BEACON ORANGE TIMEOUT ST (SAFETY) ×2 IMPLANT
CORDS BIPOLAR (ELECTRODE) ×2 IMPLANT
COVER MAYO STAND STRL (DRAPES) ×2 IMPLANT
COVER TABLE BACK 60X90 (DRAPES) ×2 IMPLANT
CUFF TOURNIQUET SINGLE 18IN (TOURNIQUET CUFF) ×2 IMPLANT
DRAPE EXTREMITY T 121X128X90 (DRAPE) ×2 IMPLANT
DRAPE SURG 17X23 STRL (DRAPES) ×2 IMPLANT
DRSG KUZMA FLUFF (GAUZE/BANDAGES/DRESSINGS) ×2 IMPLANT
GAUZE XEROFORM 1X8 LF (GAUZE/BANDAGES/DRESSINGS) ×2 IMPLANT
GLOVE BIO SURGEON STRL SZ 6.5 (GLOVE) ×2 IMPLANT
GLOVE BIOGEL PI IND STRL 8.5 (GLOVE) ×1 IMPLANT
GLOVE BIOGEL PI INDICATOR 8.5 (GLOVE) ×1
GLOVE SURG ORTHO 8.0 STRL STRW (GLOVE) ×2 IMPLANT
GOWN BRE IMP PREV XXLGXLNG (GOWN DISPOSABLE) ×2 IMPLANT
GOWN PREVENTION PLUS XLARGE (GOWN DISPOSABLE) ×2 IMPLANT
NEEDLE 27GAX1X1/2 (NEEDLE) IMPLANT
NS IRRIG 1000ML POUR BTL (IV SOLUTION) ×2 IMPLANT
PACK BASIN DAY SURGERY FS (CUSTOM PROCEDURE TRAY) ×2 IMPLANT
PAD CAST 3X4 CTTN HI CHSV (CAST SUPPLIES) ×1 IMPLANT
PADDING CAST ABS 4INX4YD NS (CAST SUPPLIES) ×1
PADDING CAST ABS COTTON 4X4 ST (CAST SUPPLIES) ×1 IMPLANT
PADDING CAST COTTON 3X4 STRL (CAST SUPPLIES) ×1
SPONGE GAUZE 4X4 12PLY (GAUZE/BANDAGES/DRESSINGS) ×2 IMPLANT
STOCKINETTE 4X48 STRL (DRAPES) ×2 IMPLANT
SUT VICRYL 4-0 PS2 18IN ABS (SUTURE) IMPLANT
SUT VICRYL RAPIDE 4/0 PS 2 (SUTURE) ×2 IMPLANT
SYR BULB 3OZ (MISCELLANEOUS) ×2 IMPLANT
SYR CONTROL 10ML LL (SYRINGE) IMPLANT
TOWEL OR 17X24 6PK STRL BLUE (TOWEL DISPOSABLE) ×2 IMPLANT
UNDERPAD 30X30 INCONTINENT (UNDERPADS AND DIAPERS) ×2 IMPLANT

## 2013-02-10 NOTE — H&P (Signed)
Kim Barnes is a 37 year-old right-hand dominant female complaining of numbness and tingling bilateral hands left worse than right, it awakens her from sleep 7 out of 7 nights.  She has no history of injury to the hand or to the neck. This has been going on for approximately two years, increasing over the past two months on a more constant basis. She has obtained braces which she wears giving her some relief of symptoms.  She was told she may have a carpal tunnel syndrome.  She complains of an intermittent, severe sharp pain with the numbness and tingling.  Stretching and massage seem to help. She has no history of diabetes, thyroid problems, arthritis or gout.  There is no family history of these. She has had her nerve conductions done by Dr. Johna Roles revealing bilateral carpal tunnel syndromes bilaterally with significant compression to the nerves.  She shows motor delay of 7.1 on the left and 7.8 on the right.  A sensory delay of 4.9 left and 5.0 on the right.  Amplitude diminution to 4 on the left and 5 on the right.    ALLERGIES:   None.  MEDICATIONS:     Bystolic.  SURGICAL HISTORY:    Brain surgery (1996), tonsillectomy (2003).  FAMILY MEDICAL HISTORY:     Positive for high blood pressure, otherwise negative.  SOCIAL HISTORY:      She does not smoke or drink.  She works for [employer].  REVIEW OF SYSTEMS:    Positive for high blood pressure, asthma, headaches, otherwise negative 14 points.  Kim Barnes is an 37 y.o. female.   Chief Complaint: CTS left HPI: see above  Past Medical History  Diagnosis Date  . Obesity   . GERD (gastroesophageal reflux disease)     occasional; no current med.  [name] HTN (hypertension)     under control with med., has been on med. x 5 yr.  . History of brain tumor 1996    no neurologic deficits  . Asthma     prn inhaler  . Carpal tunnel syndrome of left wrist 01/2013  . Polycystic ovarian syndrome     no current med.; irregular periods     Past Surgical History  Procedure Laterality Date  . Brain surgery  1996    exc. tumor  . Hysteroscopy w/d&c  02/21/2006  . Tonsillectomy  09/21/2002  . Hysteroscopy w/d&c  12/17/2011    Procedure: DILATATION AND CURETTAGE /HYSTEROSCOPY;  Surgeon: Meriel Pica, MD;  Location: [facility];  Service: Gynecology;  Laterality: N/A;  . Leep  01/07/2004    Family History  Problem Relation Age of Onset  . Hypertension Other   . Lung cancer Father     Smoker  . Cancer Father     lung/ smoker  . Breast cancer Maternal Grandmother   . Cancer Maternal Grandmother     breast  . Emphysema Paternal Grandmother   . Asthma Mother   . Asthma Maternal Aunt    Social History:  reports that she has never smoked. She has never used smokeless tobacco. She reports that  drinks alcohol. She reports that she does not use illicit drugs.  Allergies: No Known Allergies  Medications Prior to Admission  Medication Sig Dispense Refill  . Nebivolol HCl (BYSTOLIC) 20 MG TABS Take 1 tablet by mouth daily. 10 MG.      . albuterol (PROAIR HFA) 108 (90 BASE) MCG/ACT inhaler Inhale 2 puffs into the lungs every  6 (six) hours as needed for wheezing.  3 Inhaler  1  . norethindrone-ethinyl estradiol (MICROGESTIN) 1-20 MG-MCG tablet Take 1 tablet by mouth daily.  1 Package  11    No results found for this or any previous visit (from the past 48 hour(s)).  No results found.   Pertinent items are noted in HPI.  Height 5\' 5"  (1.651 m), weight 166.47 kg (367 lb), last menstrual period 08/03/2012.  General appearance: alert, cooperative and appears stated age Head: Normocephalic, without obvious abnormality Neck: no adenopathy and no JVD Resp: clear to auscultation bilaterally Cardio: regular rate and rhythm, S1, S2 normal, no murmur, click, rub or gallop GI: soft, non-tender; bowel sounds normal; no masses,  no organomegaly Extremities: extremities normal, atraumatic, no cyanosis or edema Pulses: 2+ and  symmetric Skin: Skin color, texture, turgor normal. No rashes or lesions Neurologic: Grossly normal Incision/Wound: na  Assessment/Plan: RADIOGRAPHS:     X-rays of her hands reveal degenerative changes of the carpometacarpal joint of the right thumb, otherwise negative.    DIAGNOSIS:     Degenerative arthritis, asymptomatic with left carpal tunnel syndrome.  We would recommend she seriously consider surgical decompression to the median nerves bilaterally.  The pre, peri and postoperative course were discussed along with the risks and complications.  The patient is aware there is no guarantee with the surgery, possibility of infection, recurrence, injury to arteries, nerves, tendons, incomplete relief of symptoms and dystrophy.  She would like to proceed. She is scheduled for left carpal tunnel release as an outpatient under regional anesthesia.  Bryannah Boston R 02/10/2013, 8:31 AM

## 2013-02-10 NOTE — Brief Op Note (Signed)
02/10/2013  9:45 AM  PATIENT:  Jackie L Cong  37 y.o. female  PRE-OPERATIVE DIAGNOSIS:  SEVERE BILATERAL CARPAL TUNNEL SYNDROME  POST-OPERATIVE DIAGNOSIS:  left carpal tunnel syndrome   PROCEDURE:  Procedure(s) with comments: CARPAL TUNNEL RELEASE (Left) - ANESTHESIA: IV REGIONAL FAB  SURGEON:  Surgeon(s) and Role:    * Nicki Reaper, MD - Primary  PHYSICIAN ASSISTANT:   ASSISTANTS: none   ANESTHESIA:   local and general  EBL:  Total I/O In: 700 [I.V.:700] Out: -   BLOOD ADMINISTERED:none  DRAINS: none   LOCAL MEDICATIONS USED:  MARCAINE     SPECIMEN:  No Specimen  DISPOSITION OF SPECIMEN:  N/A  COUNTS:  YES  TOURNIQUET:   Total Tourniquet Time Documented: Forearm (Left) - 15 minutes Total: Forearm (Left) - 15 minutes   DICTATION: .Other Dictation: Dictation Number [id number]  PLAN OF CARE: Discharge to home after PACU  PATIENT DISPOSITION:  PACU - hemodynamically stable.

## 2013-02-10 NOTE — Transfer of Care (Signed)
Immediate Anesthesia Transfer of Care Note  Patient: Kim Barnes  Procedure(s) Performed: Procedure(s) with comments: CARPAL TUNNEL RELEASE (Left) - ANESTHESIA: IV REGIONAL FAB  Patient Location: PACU  Anesthesia Type:General  Level of Consciousness: awake, alert  and oriented  Airway & Oxygen Therapy: Patient Spontanous Breathing and Patient connected to face mask oxygen  Post-op Assessment: Report given to PACU RN, Post -op Vital signs reviewed and stable and Patient moving all extremities  Post vital signs: Reviewed and stable  Complications: No apparent anesthesia complications

## 2013-02-10 NOTE — Progress Notes (Signed)
Patient reports irregular period, has PCOS, and also reports unprotected intercourse. Urine pregnancy ordered and sent to lap

## 2013-02-10 NOTE — Anesthesia Preprocedure Evaluation (Addendum)
Anesthesia Evaluation Anesthesia Physical Anesthesia Plan  ASA: III  Anesthesia Plan: General   Post-op Pain Management:    Induction:   Airway Management Planned:   Additional Equipment:   Intra-op Plan:   Post-operative Plan:   Informed Consent:   Dental advisory given  Plan Discussed with:   Anesthesia Plan Comments:         Anesthesia Quick Evaluation

## 2013-02-10 NOTE — Op Note (Signed)
:   Dictation Number [id number]

## 2013-02-10 NOTE — Anesthesia Postprocedure Evaluation (Signed)
Anesthesia Post Note  Patient: Kim Barnes  Procedure(s) Performed: Procedure(s) (LRB): CARPAL TUNNEL RELEASE (Left)  Anesthesia type: general  Patient location: PACU  Post pain: Pain level controlled  Post assessment: Patient's Cardiovascular Status Stable  Last Vitals:  Filed Vitals:   02/10/13 1105  BP: 158/101  Pulse: 72  Temp:   Resp:     Post vital signs: Reviewed and stable  Level of consciousness: sedated  Complications: No apparent anesthesia complications

## 2013-02-11 ENCOUNTER — Encounter (HOSPITAL_BASED_OUTPATIENT_CLINIC_OR_DEPARTMENT_OTHER): Payer: Self-pay | Admitting: Orthopedic Surgery

## 2013-02-11 ENCOUNTER — Other Ambulatory Visit: Payer: Self-pay | Admitting: Internal Medicine

## 2013-02-11 NOTE — Op Note (Signed)
Kim Barnes, Kim Barnes                ACCOUNT NO.:  [id number]  MEDICAL RECORD NO.:  [id number]  LOCATION:                                 FACILITY:  PHYSICIAN:  Cindee Salt, M.D.            DATE OF BIRTH:  DATE OF PROCEDURE:  02/10/2013 DATE OF DISCHARGE:                              OPERATIVE REPORT   POSTOPERATIVE DIAGNOSIS:  Carpal tunnel syndrome, left hand.  POSTOPERATIVE DIAGNOSIS:  Carpal tunnel syndrome, left hand.  OPERATION:  Decompression left median nerve.  SURGEON:  Cindee Salt, MD.  ANESTHESIA:  General with local infiltration.  ANESTHESIOLOGIST:  Darliss Cheney, MD.  HISTORY:  The patient is a 37 year old female with a carpal tunnel syndrome.  Nerve conduction is positive, not responsive to conservative treatment.  She has elected to undergo surgical decompression of the median nerve.  Pre, peri, and postoperative course have been discussed along with risks and complications.  She is aware that there is no guarantee with surgery; possibility of infection; recurrence of injury to arteries, nerves, tendons, incomplete relief of symptoms, dystrophy. In the preoperative area, the patient is seen, the extremity marked by both the patient and surgeon.  Antibiotic given.  PROCEDURE:  The patient was brought to the operating room where general anesthetic was carried out without difficulty.  She was prepped using ChloraPrep, supine position with the left arm free.  A 3-minute dry time was allowed.  Time-out taken, confirming the patient and the procedure. The limb was exsanguinated with an Esmarch bandage.  Tourniquet placed on the forearm, it was inflated to 275 mmHg.  A straight incision was made longitudinally in the palm, carried down through subcutaneous tissue.  Bleeders were electrocauterized with bipolar.  The palmar fascia was split.  Superficial palmar arch identified.  The flexor tendon to the ring little finger identified to the ulnar side of the median nerve.   Carpal retinaculum was incised with sharp dissection. The right angle and Sewall retractor were placed between the skin and forearm fascia.  The fascia was released for approximately a 1.5-2.0 cm under direct vision.  Canal was explored.  Air compression of the nerve was immediately apparent.  No further lesions were identified.  The motor branch, entered into the muscle.  The wound was copiously irrigated with saline.  Skin closed with 4-0 Vicryl repeated sutures. Local infiltration with 0.25% Marcaine without epinephrine was given 10 mL was used.  A sterile compressive dressing with fingers free was applied.  On deflation of the tourniquet, all fingers immediately pinked.  She was taken to the recovery room for observation in satisfactory condition.  She will be discharged home to return in 1 week on Norco.          ______________________________ Cindee Salt, M.D.     GK/MEDQ  D:  02/10/2013  T:  02/11/2013  Job:  [id number]

## 2013-02-18 ENCOUNTER — Other Ambulatory Visit: Payer: Self-pay | Admitting: Internal Medicine

## 2013-04-30 ENCOUNTER — Ambulatory Visit (INDEPENDENT_AMBULATORY_CARE_PROVIDER_SITE_OTHER): Payer: 59 | Admitting: Internal Medicine

## 2013-04-30 ENCOUNTER — Encounter: Payer: Self-pay | Admitting: Internal Medicine

## 2013-04-30 VITALS — BP 152/102 | HR 83 | Temp 97.9°F | Ht 65.0 in | Wt 369.5 lb

## 2013-04-30 DIAGNOSIS — I10 Essential (primary) hypertension: Secondary | ICD-10-CM

## 2013-04-30 DIAGNOSIS — J029 Acute pharyngitis, unspecified: Secondary | ICD-10-CM

## 2013-04-30 DIAGNOSIS — J309 Allergic rhinitis, unspecified: Secondary | ICD-10-CM

## 2013-04-30 MED ORDER — NEBIVOLOL HCL 20 MG PO TABS: 1.0000 | ORAL_TABLET | Freq: Every day | ORAL | Status: DC

## 2013-04-30 MED ORDER — METHYLPREDNISOLONE ACETATE 80 MG/ML IJ SUSP
80.0000 mg | Freq: Once | INTRAMUSCULAR | Status: AC
  Administered 2013-04-30: 80 mg via INTRAMUSCULAR

## 2013-04-30 NOTE — Patient Instructions (Signed)
Viral Pharyngitis Viral pharyngitis is a viral infection that produces redness, pain, and swelling (inflammation) of the throat. It can spread from person to person (contagious). CAUSES Viral pharyngitis is caused by inhaling a large amount of certain germs called viruses. Many different viruses cause viral pharyngitis. SYMPTOMS Symptoms of viral pharyngitis include:  Sore throat.  Tiredness.  Stuffy nose.  Low-grade fever.  Congestion.  Cough. TREATMENT Treatment includes rest, drinking plenty of fluids, and the use of over-the-counter medication (approved by your caregiver). HOME CARE INSTRUCTIONS   Drink enough fluids to keep your urine clear or pale yellow.  Eat soft, cold foods such as ice cream, frozen ice pops, or gelatin dessert.  Gargle with warm salt water (1 tsp salt per 1 qt of water).  If over age 7, throat lozenges may be used safely.  Only take over-the-counter or prescription medicines for pain, discomfort, or fever as directed by your caregiver. Do not take aspirin. To help prevent spreading viral pharyngitis to others, avoid:  Mouth-to-mouth contact with others.  Sharing utensils for eating and drinking.  Coughing around others. SEEK MEDICAL CARE IF:   You are better in a few days, then become worse.  You have a fever or pain not helped by pain medicines.  There are any other changes that concern you. Document Released: 09/05/2005 Document Revised: 02/18/2012 Document Reviewed: 02/01/2011 Exit[employer] Patient Information 2014 [employer].  

## 2013-04-30 NOTE — Addendum Note (Signed)
Addended by: Carin Primrose on: 04/30/2013 10:48 AM   Modules accepted: Orders

## 2013-04-30 NOTE — Progress Notes (Signed)
HPI  Pt presents to the clinic today with c/o sore throat and ear pain x 4 days. The worst part is the sore throat . She denies fever, chills or body aches. She does have a history of allergies and asthma both of which are well controlled. She only takes Benadryl as needed for her allergies. She has not had sick contacts. Additionally, her blood pressure is elevated. She has been out of her Bystolic for some time. She denies blurred vision, chest pain., chest tightness, shortness of breath or uncontrolled bleeding. She would like a refill of that today.  Review of Systems      Past Medical History  Diagnosis Date  . Obesity   . GERD (gastroesophageal reflux disease)     occasional; no current med.  [name] HTN (hypertension)     under control with med., has been on med. x 5 yr.  . History of brain tumor 1996    no neurologic deficits  . Asthma     prn inhaler  . Carpal tunnel syndrome of left wrist 01/2013  . Polycystic ovarian syndrome     no current med.; irregular periods    Family History  Problem Relation Age of Onset  . Hypertension Other   . Lung cancer Father     Smoker  . Cancer Father     lung/ smoker  . Breast cancer Maternal Grandmother   . Cancer Maternal Grandmother     breast  . Emphysema Paternal Grandmother   . Asthma Mother   . Asthma Maternal Aunt     History   Social History  . Marital Status: Single    Spouse Name: N/A    Number of Children: N/A  . Years of Education: N/A   Occupational History  . 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 Smoking status: Never Smoker   . Smokeless tobacco: Never Used  . Alcohol Use: Yes     Comment: rarely  . Drug Use: No  . Sexually Active: Yes    Birth Control/ Protection: Pill   Other Topics Concern  . Not on file   Social History Narrative  . No narrative on file    No Known Allergies   Constitutional:  Denies headache, fatigue, fever or abrupt weight changes.  HEENT:  Positive ear pain  and sore throat. Denies eye redness, eye pain, pressure behind the eyes, facial pain, nasal congestion,  ringing in the ears, wax buildup, runny nose or bloody nose. Respiratory:  Denies cough, difficulty breathing or shortness of breath.  Cardiovascular: Denies chest pain, chest tightness, palpitations or swelling in the hands or feet.   No other specific complaints in a complete review of systems (except as listed in HPI above).  Objective:   BP 152/102  Pulse 83  Temp(Src) 97.9 F (36.6 C) (Oral)  Ht 5\' 5"  (1.651 m)  Wt 369 lb 8 oz (167.604 kg)  BMI 61.49 kg/m2  SpO2 97% Wt Readings from Last 3 Encounters:  04/30/13 369 lb 8 oz (167.604 kg)  02/10/13 367 lb 7 oz (166.669 kg)  02/10/13 367 lb 7 oz (166.669 kg)     General: Appears her stated age, obese but well developed, well nourished in NAD. HEENT: Head: normal shape and size; Eyes: sclera white, no icterus, conjunctiva pink, PERRLA and EOMs intact; Ears: Tm's gray and intact, normal light reflex; Nose: mucosa pink and moist, septum midline; Throat/Mouth: + PND. Teeth present, mucosa erythematous and moist, no  exudate noted, no lesions or ulcerations noted.  Neck: Neck supple, trachea midline. No massses, lumps or thyromegaly present.  Cardiovascular: Normal rate and rhythm. S1,S2 noted.  No murmur, rubs or gallops noted. No JVD or BLE edema. No carotid bruits noted. Pulmonary/Chest: Normal effort and positive vesicular breath sounds. No respiratory distress. No wheezes, rales or ronchi noted.      Assessment & Plan:   Acute pharyngitis secondary to allergic rhinits:  Get some rest and drink plenty of water Do salt water gargles for the sore throat Start taking Zyrtec OTD daily with bendaryl as needed 80 mg Depo IM  RTC as needed or if symptoms persist.

## 2013-04-30 NOTE — Assessment & Plan Note (Signed)
Uncontrolled off medication Will refill bystolic today Work on diet and exercise Make an appt with Dr. Posey Rea to f/u

## 2013-10-01 ENCOUNTER — Other Ambulatory Visit (INDEPENDENT_AMBULATORY_CARE_PROVIDER_SITE_OTHER): Payer: 59

## 2013-10-01 ENCOUNTER — Ambulatory Visit (INDEPENDENT_AMBULATORY_CARE_PROVIDER_SITE_OTHER): Payer: 59 | Admitting: Internal Medicine

## 2013-10-01 ENCOUNTER — Encounter: Payer: Self-pay | Admitting: Internal Medicine

## 2013-10-01 VITALS — BP 138/92 | HR 68 | Temp 98.5°F | Resp 16 | Ht 65.0 in | Wt 375.0 lb

## 2013-10-01 DIAGNOSIS — Z0000 Encounter for general adult medical examination without abnormal findings: Secondary | ICD-10-CM

## 2013-10-01 DIAGNOSIS — Z0100 Encounter for examination of eyes and vision without abnormal findings: Secondary | ICD-10-CM

## 2013-10-01 DIAGNOSIS — J45909 Unspecified asthma, uncomplicated: Secondary | ICD-10-CM

## 2013-10-01 DIAGNOSIS — R7309 Other abnormal glucose: Secondary | ICD-10-CM

## 2013-10-01 DIAGNOSIS — K219 Gastro-esophageal reflux disease without esophagitis: Secondary | ICD-10-CM

## 2013-10-01 DIAGNOSIS — R05 Cough: Secondary | ICD-10-CM

## 2013-10-01 DIAGNOSIS — I10 Essential (primary) hypertension: Secondary | ICD-10-CM

## 2013-10-01 LAB — HEPATIC FUNCTION PANEL
ALT: 24 U/L (ref 0–35)
AST: 22 U/L (ref 0–37)
Albumin: 4 g/dL (ref 3.5–5.2)
Total Bilirubin: 0.5 mg/dL (ref 0.3–1.2)
Total Protein: 8 g/dL (ref 6.0–8.3)

## 2013-10-01 LAB — URINALYSIS
Bilirubin Urine: NEGATIVE
Ketones, ur: NEGATIVE
Nitrite: NEGATIVE
Specific Gravity, Urine: 1.015 (ref 1.000–1.030)
Total Protein, Urine: NEGATIVE
Urobilinogen, UA: 0.2 (ref 0.0–1.0)
pH: 7.5 (ref 5.0–8.0)

## 2013-10-01 LAB — BASIC METABOLIC PANEL
BUN: 7 mg/dL (ref 6–23)
Calcium: 9.2 mg/dL (ref 8.4–10.5)
Chloride: 101 mEq/L (ref 96–112)
Potassium: 3.4 mEq/L — ABNORMAL LOW (ref 3.5–5.1)
Sodium: 139 mEq/L (ref 135–145)

## 2013-10-01 LAB — CBC WITH DIFFERENTIAL/PLATELET
Basophils Relative: 0.3 % (ref 0.0–3.0)
Eosinophils Relative: 2.1 % (ref 0.0–5.0)
HCT: 39.7 % (ref 36.0–46.0)
Hemoglobin: 13.5 g/dL (ref 12.0–15.0)
Lymphs Abs: 2.3 10*3/uL (ref 0.7–4.0)
MCV: 86.1 fl (ref 78.0–100.0)
Monocytes Absolute: 0.3 10*3/uL (ref 0.1–1.0)
Neutro Abs: 3.3 10*3/uL (ref 1.4–7.7)
Neutrophils Relative %: 54.6 % (ref 43.0–77.0)
Platelets: 290 10*3/uL (ref 150.0–400.0)
RBC: 4.61 Mil/uL (ref 3.87–5.11)
WBC: 6.1 10*3/uL (ref 4.5–10.5)

## 2013-10-01 LAB — TSH: TSH: 1.12 u[IU]/mL (ref 0.35–5.50)

## 2013-10-01 MED ORDER — VITAMIN D 1000 UNITS PO TABS: 1000.0000 [IU] | ORAL_TABLET | Freq: Every day | ORAL | Status: AC

## 2013-10-01 MED ORDER — MONTELUKAST SODIUM 10 MG PO TABS: 10.0000 mg | ORAL_TABLET | Freq: Every day | ORAL | Status: DC

## 2013-10-01 MED ORDER — LORCASERIN HCL 10 MG PO TABS: 1.0000 | ORAL_TABLET | Freq: Two times a day (BID) | ORAL | Status: DC

## 2013-10-01 MED ORDER — ESOMEPRAZOLE MAGNESIUM 20 MG PO CPDR: 20.0000 mg | DELAYED_RELEASE_CAPSULE | Freq: Every day | ORAL | Status: DC

## 2013-10-01 MED ORDER — ALBUTEROL SULFATE HFA 108 (90 BASE) MCG/ACT IN AERS: 2.0000 | INHALATION_SPRAY | Freq: Four times a day (QID) | RESPIRATORY_TRACT | Status: DC | PRN

## 2013-10-01 MED ORDER — NEBIVOLOL HCL 20 MG PO TABS: 1.0000 | ORAL_TABLET | Freq: Every day | ORAL | Status: DC

## 2013-10-01 NOTE — Assessment & Plan Note (Signed)
Singulair PPI

## 2013-10-01 NOTE — Assessment & Plan Note (Signed)
No sx'x

## 2013-10-01 NOTE — Assessment & Plan Note (Addendum)
Declined Lap band before Discussed Will try Belviq

## 2013-10-01 NOTE — Assessment & Plan Note (Signed)
We discussed age appropriate health related issues, including available/recomended screening tests and vaccinations. We discussed a need for adhering to healthy diet and exercise. Labs/EKG were reviewed/ordered. All questions were answered.   

## 2013-10-01 NOTE — Progress Notes (Signed)
  Subjective:    Patient ID: Kim Barnes, female    DOB: [birth year], 37 y.o.   MRN: [id number]  HPI  The patient is here for a wellness exam. The patient has been doing well overall without major physical or psychological issues going on lately.  C/o several weeks of cough again - dry, was productive up to last week; recurrent  Wt Readings from Last 3 Encounters:  10/01/13 375 lb (170.099 kg)  04/30/13 369 lb 8 oz (167.604 kg)  02/10/13 367 lb 7 oz (166.669 kg)   BP Readings from Last 3 Encounters:  10/01/13 138/92  04/30/13 152/102  02/10/13 158/101      Review of Systems  Constitutional: Negative for chills, activity change, appetite change, fatigue and unexpected weight change.  HENT: Negative for congestion, mouth sores and sinus pressure.   Eyes: Negative for visual disturbance.  Respiratory: Positive for cough and wheezing. Negative for chest tightness and shortness of breath.   Gastrointestinal: Negative for nausea and abdominal pain.  Genitourinary: Negative for frequency, difficulty urinating and vaginal pain.  Musculoskeletal: Negative for back pain and gait problem.  Skin: Negative for pallor and rash.  Neurological: Negative for dizziness, tremors, weakness, numbness and headaches.  Psychiatric/Behavioral: Negative for confusion and sleep disturbance.       Objective:   Physical Exam  Constitutional: She appears well-developed. No distress.  obese  HENT:  Head: Normocephalic.  Right Ear: External ear normal.  Left Ear: External ear normal.  Nose: Nose normal.  Mouth/Throat: Oropharynx is clear and moist.  Eyes: Conjunctivae are normal. Pupils are equal, round, and reactive to light. Right eye exhibits no discharge. Left eye exhibits no discharge.  Neck: Normal range of motion. Neck supple. No JVD present. No tracheal deviation present. No thyromegaly present.  Cardiovascular: Normal rate, regular rhythm and normal heart sounds.   Pulmonary/Chest: No  stridor. No respiratory distress. She has no wheezes.  Abdominal: Soft. Bowel sounds are normal. She exhibits no distension and no mass. There is no tenderness. There is no rebound and no guarding.  Musculoskeletal: She exhibits no edema and no tenderness.  Lymphadenopathy:    She has no cervical adenopathy.  Neurological: She displays normal reflexes. No cranial nerve deficit. She exhibits normal muscle tone. Coordination normal.  Skin: No rash noted. No erythema.  Psychiatric: She has a normal mood and affect. Her behavior is normal. Judgment and thought content normal.         Assessment & Plan:

## 2013-10-01 NOTE — Patient Instructions (Signed)
  [email or web address]    Milk free trial (no milk, ice cream, cheese and yogurt) for 4-6 weeks. OK to use almond, coconut, rice or soy milk. "Almond breeze" brand tastes good.

## 2013-10-08 NOTE — Assessment & Plan Note (Signed)
Gluten free trial (no wheat products) for 4-6 weeks. OK to use gluten-free bread and gluten-free pasta.  Milk free trial (no milk, ice cream, cheese and yogurt) for 4-6 weeks. OK to use almond, coconut, rice or soy milk. "Almond breeze" brand tastes good.  Continue with current prn prescription therapy as reflected on the Med list.  

## 2013-10-08 NOTE — Assessment & Plan Note (Signed)
Labs Wt loss 

## 2013-10-08 NOTE — Assessment & Plan Note (Signed)
Continue with current prescription therapy as reflected on the Med list. BP Readings from Last 3 Encounters:  10/01/13 138/92  04/30/13 152/102  02/10/13 158/101

## 2013-11-16 ENCOUNTER — Telehealth: Payer: Self-pay | Admitting: Internal Medicine

## 2013-11-16 MED ORDER — HYDROCHLOROTHIAZIDE 12.5 MG PO CAPS: 12.5000 mg | ORAL_CAPSULE | Freq: Every day | ORAL | Status: DC

## 2013-11-16 NOTE — Telephone Encounter (Signed)
C/o fluid retention Needs rx: Microzide 1 a day

## 2013-12-30 ENCOUNTER — Emergency Department (HOSPITAL_COMMUNITY)
Admission: EM | Admit: 2013-12-30 | Discharge: 2013-12-30 | Disposition: A | Discharge status: Home / Self Care | Payer: 59 | Attending: Emergency Medicine | Admitting: Emergency Medicine

## 2013-12-30 ENCOUNTER — Emergency Department (HOSPITAL_COMMUNITY): Payer: 59

## 2013-12-30 ENCOUNTER — Encounter (HOSPITAL_COMMUNITY): Payer: Self-pay | Admitting: Emergency Medicine

## 2013-12-30 DIAGNOSIS — I10 Essential (primary) hypertension: Secondary | ICD-10-CM | POA: Insufficient documentation

## 2013-12-30 DIAGNOSIS — R079 Chest pain, unspecified: Secondary | ICD-10-CM

## 2013-12-30 DIAGNOSIS — Z79899 Other long term (current) drug therapy: Secondary | ICD-10-CM | POA: Insufficient documentation

## 2013-12-30 DIAGNOSIS — R109 Unspecified abdominal pain: Secondary | ICD-10-CM | POA: Insufficient documentation

## 2013-12-30 DIAGNOSIS — R0789 Other chest pain: Secondary | ICD-10-CM | POA: Insufficient documentation

## 2013-12-30 DIAGNOSIS — J45909 Unspecified asthma, uncomplicated: Secondary | ICD-10-CM | POA: Insufficient documentation

## 2013-12-30 DIAGNOSIS — E669 Obesity, unspecified: Secondary | ICD-10-CM | POA: Insufficient documentation

## 2013-12-30 DIAGNOSIS — K219 Gastro-esophageal reflux disease without esophagitis: Secondary | ICD-10-CM | POA: Insufficient documentation

## 2013-12-30 DIAGNOSIS — Z8669 Personal history of other diseases of the nervous system and sense organs: Secondary | ICD-10-CM | POA: Insufficient documentation

## 2013-12-30 LAB — BASIC METABOLIC PANEL
BUN: 12 mg/dL (ref 6–23)
CHLORIDE: 102 meq/L (ref 96–112)
CO2: 28 mEq/L (ref 19–32)
CREATININE: 0.93 mg/dL (ref 0.50–1.10)
Calcium: 9.3 mg/dL (ref 8.4–10.5)
GFR calc non Af Amer: 79 mL/min — ABNORMAL LOW (ref >90)
GLUCOSE: 91 mg/dL (ref 70–99)
Potassium: 3.8 mEq/L (ref 3.7–5.3)
Sodium: 141 mEq/L (ref 137–147)

## 2013-12-30 LAB — POCT I-STAT TROPONIN I: Troponin i, poc: 0 ng/mL (ref 0.00–0.08)

## 2013-12-30 LAB — CBC
HEMATOCRIT: 42.7 % (ref 36.0–46.0)
Hemoglobin: 14.2 g/dL (ref 12.0–15.0)
MCH: 29.2 pg (ref 26.0–34.0)
MCHC: 33.3 g/dL (ref 30.0–36.0)
MCV: 87.9 fL (ref 78.0–100.0)
Platelets: 300 10*3/uL (ref 150–400)
RBC: 4.86 MIL/uL (ref 3.87–5.11)
RDW: 14.3 % (ref 11.5–15.5)
WBC: 7.1 10*3/uL (ref 4.0–10.5)

## 2013-12-30 NOTE — ED Notes (Signed)
Pt c/o lt sided throbbing CP since last night.  States that she has never felt this pain before.  States hx of htn.

## 2013-12-30 NOTE — ED Provider Notes (Signed)
CSN: [id number]     Arrival date & time 12/30/13  1813 History   First MD Initiated Contact with Patient 12/30/13 1904     Chief Complaint  Patient presents with  . Chest Pain   (Consider location/radiation/quality/duration/timing/severity/associated sxs/prior Treatment) Patient is a 38 y.o. female presenting with chest pain. The history is provided by the patient. No language interpreter was used.  Chest Pain Pain location:  L chest Associated symptoms: no cough, no fever, no nausea, no palpitations, no shortness of breath and not vomiting   Associated symptoms comment:  She complains of non-exertional pain in left chest last night that was intermittent. It resolved without intervention and returned today when she was bending over. No SOB, nausea or vomiting. She has had upper abdominal discomfort and has a history of GERD for which she states she was told to take Nexium and Zantac PRN. She has not needed to take either medication recently.   Past Medical History  Diagnosis Date  . Obesity   . GERD (gastroesophageal reflux disease)     occasional; no current med.  [name] HTN (hypertension)     under control with med., has been on med. x 5 yr.  . History of brain tumor 1996    no neurologic deficits  . Asthma     prn inhaler  . Carpal tunnel syndrome of left wrist 01/2013  . Polycystic ovarian syndrome     no current med.; irregular periods   Past Surgical History  Procedure Laterality Date  . Brain surgery  1996    exc. tumor  . Hysteroscopy w/d&c  02/21/2006  . Tonsillectomy  09/21/2002  . Hysteroscopy w/d&c  12/17/2011    Procedure: DILATATION AND CURETTAGE /HYSTEROSCOPY;  Surgeon: Meriel Pica, MD;  Location: [facility];  Service: Gynecology;  Laterality: N/A;  . Leep  01/07/2004  . Carpal tunnel release Left 02/10/2013    Procedure: CARPAL TUNNEL RELEASE;  Surgeon: Nicki Reaper, MD;  Location: [facility];  Service: Orthopedics;  Laterality: Left;  ANESTHESIA: IV  REGIONAL FAB   Family History  Problem Relation Age of Onset  . Hypertension Other   . Lung cancer Father     Smoker  . Cancer Father     lung/ smoker  . Breast cancer Maternal Grandmother   . Cancer Maternal Grandmother     breast  . Emphysema Paternal Grandmother   . Asthma Mother   . Asthma Maternal Aunt    History  Substance Use Topics  . Smoking status: Never Smoker   . Smokeless tobacco: Never Used  . Alcohol Use: Yes     Comment: rarely   OB History   Grav Para Term Preterm Abortions TAB SAB Ect Mult Living                 Review of Systems  Constitutional: Negative for fever and chills.  Respiratory: Negative.  Negative for cough and shortness of breath.   Cardiovascular: Positive for chest pain. Negative for palpitations.  Gastrointestinal: Negative.  Negative for nausea and vomiting.  Genitourinary: Negative.   Musculoskeletal: Negative.   Skin: Negative.   Neurological: Negative.     Allergies  Review of patient's allergies indicates no known allergies.  Home Medications   Current Outpatient Rx  Name  Route  Sig  Dispense  Refill  . albuterol (PROAIR HFA) 108 (90 BASE) MCG/ACT inhaler   Inhalation   Inhale 2 puffs into the lungs every 6 (six) hours as  needed for wheezing.   3 Inhaler   1   . cholecalciferol (VITAMIN D) 1000 UNITS tablet   Oral   Take 1 tablet (1,000 Units total) by mouth daily.   100 tablet   3   . esomeprazole (NEXIUM 24HR) 20 MG capsule   Oral   Take 1 capsule (20 mg total) by mouth daily before breakfast.   30 capsule   11   . hydrochlorothiazide (MICROZIDE) 12.5 MG capsule   Oral   Take 1 capsule (12.5 mg total) by mouth daily.   30 capsule   5   . Nebivolol HCl (BYSTOLIC) 20 MG TABS   Oral   Take 1 tablet (20 mg total) by mouth daily. 10 MG.   30 tablet   11   . Lorcaserin HCl (BELVIQ) 10 MG TABS   Oral   Take 1 tablet by mouth 2 (two) times daily.   60 tablet   3   . montelukast (SINGULAIR) 10 MG  tablet   Oral   Take 1 tablet (10 mg total) by mouth daily.   30 tablet   11    BP 149/90  Pulse 78  Temp(Src) 98.4 F (36.9 C) (Oral)  SpO2 99% Physical Exam  Constitutional: She is oriented to person, place, and time. She appears well-developed and well-nourished.  HENT:  Head: Normocephalic.  Neck: Normal range of motion. Neck supple.  Cardiovascular: Normal rate and regular rhythm.   Pulmonary/Chest: Effort normal and breath sounds normal. She has no rales. She exhibits no tenderness.  Abdominal: Soft. Bowel sounds are normal. There is no tenderness. There is no rebound and no guarding.  Musculoskeletal: Normal range of motion.  Neurological: She is alert and oriented to person, place, and time.  Skin: Skin is warm and dry. No rash noted.  Psychiatric: She has a normal mood and affect.    ED Course  Procedures (including critical care time) Labs Review Labs Reviewed  BASIC METABOLIC PANEL - Abnormal; Notable for the following:    GFR calc non Af Amer 79 (*)    All other components within normal limits  CBC  POCT I-STAT TROPONIN I   Results for orders placed during the hospital encounter of 12/30/13  CBC      Result Value Range   WBC 7.1  4.0 - 10.5 K/uL   RBC 4.86  3.87 - 5.11 MIL/uL   Hemoglobin 14.2  12.0 - 15.0 g/dL   HCT 16.1  09.6 - 04.5 %   MCV 87.9  78.0 - 100.0 fL   MCH 29.2  26.0 - 34.0 pg   MCHC 33.3  30.0 - 36.0 g/dL   RDW 40.9  81.1 - 91.4 %   Platelets 300  150 - 400 K/uL  BASIC METABOLIC PANEL      Result Value Range   Sodium 141  137 - 147 mEq/L   Potassium 3.8  3.7 - 5.3 mEq/L   Chloride 102  96 - 112 mEq/L   CO2 28  19 - 32 mEq/L   Glucose, Bld 91  70 - 99 mg/dL   BUN 12  6 - 23 mg/dL   Creatinine, Ser 7.82  0.50 - 1.10 mg/dL   Calcium 9.3  8.4 - 95.6 mg/dL   GFR calc non Af Amer 79 (*) >90 mL/min   GFR calc Af Amer >90  >90 mL/min  POCT I-STAT TROPONIN I      Result Value Range   Troponin i, poc 0.00  0.00 - 0.08 ng/mL   Comment 3              Imaging Review Dg Chest 2 View  12/30/2013   CLINICAL DATA:  Chest pain.  EXAM: CHEST  2 VIEW  COMPARISON:  CT ANGIO CHEST W/CM &/OR WO/CM dated 05/12/2009  FINDINGS: Lateral view degraded by patient arm position and body habitus. Midline trachea. Borderline cardiomegaly. Mediastinal contours otherwise within normal limits. No pleural effusion or pneumothorax. Low lung volumes with resultant pulmonary interstitial prominence. Clear lungs.  IMPRESSION: Borderline cardiomegaly and low lung volumes, without acute disease.   Electronically Signed   By: Jeronimo GreavesKyle  Talbot M.D.   On: 12/30/2013 19:47    EKG Interpretation   None       MDM  No diagnosis found. 1. Chest pain  DDx: coronary syndrome vs dyspepsia vs muscular chest pain. Non-acute EKG, reassuring labs, neg CXR - doubt ACS. Not reproducible pain, intermittent quality - doubt chest wall pain. Likely dyspepsia/reflux and will encourage regular use of medications (Nexium and Zantac) and follow up with primary care for recheck.     Arnoldo HookerShari A Aidenjames Heckmann, PA-C 12/30/13 2020

## 2013-12-30 NOTE — Discharge Instructions (Signed)
TAKE NEXIUM AND ZANTAC ON A REGULAR BASIS FOR THE NEXT 1-2 WEEKS. FOLLOW UP WITH YOUR DOCTOR FOR RECHECK IN ONE WEEK, AND RETURN HERE IF SYMPTOMS CHANGE, WORSEN OR YOU HAVE NEW CONCERN.

## 2013-12-31 NOTE — ED Provider Notes (Signed)
Medical screening examination/treatment/procedure(s) were performed by non-physician practitioner and as supervising physician I was immediately available for consultation/collaboration.  EKG Interpretation    Date/Time:  Wednesday December 30 2013 18:32:32 EST Ventricular Rate:  75 PR Interval:  164 QRS Duration: 92 QT Interval:  391 QTC Calculation: 437 R Axis:   -14 Text Interpretation:  Sinus rhythm Probable left ventricular hypertrophy Nonspecific T abnrm, anterolateral leads Baseline wander in lead(s) V1 No significant change since last tracing Confirmed by Rubin PayorPICKERING  MD, Silva Aamodt ([id number]) on 12/30/2013 8:18:51 PM             Juliet RudeNathan R. Rubin PayorPickering, MD 12/31/13 [id number]

## 2014-05-14 ENCOUNTER — Telehealth: Payer: Self-pay | Admitting: Internal Medicine

## 2014-05-14 DIAGNOSIS — R059 Cough, unspecified: Secondary | ICD-10-CM

## 2014-05-14 DIAGNOSIS — R05 Cough: Secondary | ICD-10-CM

## 2014-05-14 NOTE — Telephone Encounter (Signed)
Pt request referral to Pulmonary due to asthma attach from excessive coughing. Please advise. Last ov with Dr. Macario Golds 09/2013.

## 2014-05-16 NOTE — Telephone Encounter (Signed)
Ok Done Thx 

## 2014-05-17 NOTE — Telephone Encounter (Signed)
Pt aware.

## 2014-05-24 ENCOUNTER — Encounter: Payer: Self-pay | Admitting: Internal Medicine

## 2014-05-24 ENCOUNTER — Ambulatory Visit (INDEPENDENT_AMBULATORY_CARE_PROVIDER_SITE_OTHER): Payer: 59 | Admitting: Internal Medicine

## 2014-05-24 ENCOUNTER — Encounter (INDEPENDENT_AMBULATORY_CARE_PROVIDER_SITE_OTHER): Payer: Self-pay

## 2014-05-24 VITALS — BP 122/80 | HR 79 | Temp 98.7°F | Ht 65.0 in | Wt 371.6 lb

## 2014-05-24 DIAGNOSIS — R059 Cough, unspecified: Secondary | ICD-10-CM

## 2014-05-24 DIAGNOSIS — R05 Cough: Secondary | ICD-10-CM

## 2014-05-24 DIAGNOSIS — R058 Other specified cough: Secondary | ICD-10-CM

## 2014-05-24 MED ORDER — PREDNISONE 10 MG PO TABS: ORAL_TABLET | ORAL | Status: DC

## 2014-05-24 MED ORDER — TRAMADOL HCL 50 MG PO TABS: ORAL_TABLET | ORAL | Status: DC

## 2014-05-24 MED ORDER — FAMOTIDINE 20 MG PO TABS: ORAL_TABLET | ORAL | Status: DC

## 2014-05-24 MED ORDER — PANTOPRAZOLE SODIUM 40 MG PO TBEC: 40.0000 mg | DELAYED_RELEASE_TABLET | Freq: Every day | ORAL | Status: DC

## 2014-05-24 NOTE — Progress Notes (Signed)
Subjective:    Patient ID: Kim Barnes, female    DOB: 07/07/1976  MRN: [id number]  HPI   [age] yobf dx with asthma as child but had rare attacks couldn't breath controlled with as needed saba on shots until age 38 per Dr Maple HudsonYoung - shots did help and no problem with change of seasons but since around 2010 typically fall in winter or winter spring better eval in pulmonary clinic 2011 > rx changed to bystolic/ nexium/pepcid seemed to help referred back to pulmonary clinic  05/24/2014 by Dr Posey ReaPlotnikov with persistent cough x first of 2015.   July 04, 2010 since age 38 problems recurrent ? maybe worse in spring coughs so hard looses breath and sometimes incontinence but never vomiting and sleeps ok without cough. rec stop symbicort, singulair and tribenzor  Proaire if needed for short of breath  Bystolic 20 daily  Continue Take one 30-60 min before first meal of the day  Pepcid ac 20 mg one bedtime  For cough, add tramadol 50 mg one every 4 hours as need   July 18, 2010 cc cough has resolved. No need for saba at all.  . No cough since 8/4 no need for tramadol.  rec continue max gerd rx/ f/u prn    05/24/2014 1st [facility] office visit/EP[facility] era  [name]  Chief Complaint  Patient presents with  . Pulmonary Consult    Referred per Dr. Posey ReaPlotnikov. Pt c/o wheezing and cough x 2 months- cough mainly non prod.  She is also having more SOB than usual, and is using proair 4-5 times per day with little relief.   does not wake her at night or in am/ starts back up w/in a few hours and cough  is dry    Kouffman Reflux v Neurogenic Cough Differentiator Reflux Comments  Do you awaken from a sound sleep coughing violently?                            With trouble breathing? no   Do you have choking episodes when you cannot  Get enough air, gasping for air ?              yes   Do you usually cough when you lie down into  The bed, or when you just lie down to rest ?                          no   Do  you usually cough after meals or eating?         no   Do you cough when (or after) you bend over?    no   GERD SCORE     Kouffman Reflux v Neurogenic Cough Differentiator Neurogenic   Do you more-or-less cough all day long? Not typically Clears throat all day  Does change of temperature make you cough? yes   Does laughing or chuckling cause you to cough? no   Do fumes (perfume, automobile fumes, burned  Toast, etc.,) cause you to cough ?      yes   Does speaking, singing, or talking on the phone cause you to cough   ?               No    Neurogenic/Airway score      No obvious other patterns in day to day or daytime variabilty or assoc cp or chest  tightness, subjective wheeze overt sinus or hb symptoms. No unusual exp hx or h/o childhood pna/ asthma or knowledge of premature birth.  Sleeping ok without nocturnal  or early am exacerbation  of respiratory  c/o's or need for noct saba. Also denies any obvious fluctuation of symptoms with weather or environmental changes or other aggravating or alleviating factors except as outlined above   Current Medications, Allergies, Complete Past Medical History, Past Surgical History, Family History, and Social History were reviewed in [facility] electronic medical record.               Past Medical History:  Hypertension  Obesity  Amenorrhea Dr Marcelle OverlieHolland  Asthma  Depression  Chronic cough  - onset 04/2010 > resolved July 18, 2010 on max gerd rx and off ICS/ on bystolic            Review of Systems  Constitutional: Negative for fever, chills and unexpected weight change.  HENT: Negative for congestion, dental problem, ear pain, nosebleeds, postnasal drip, rhinorrhea, sinus pressure, sneezing, sore throat, trouble swallowing and voice change.   Eyes: Negative for visual disturbance.  Respiratory: Positive for cough and shortness of breath. Negative for choking.   Cardiovascular: Negative for chest pain and leg swelling.    Gastrointestinal: Negative for vomiting, abdominal pain and diarrhea.  Genitourinary: Negative for difficulty urinating.  Musculoskeletal: Negative for arthralgias.  Skin: Negative for rash.  Neurological: Negative for tremors, syncope and headaches.  Hematological: Does not bruise/bleed easily.       Objective:   Physical Exam  Additional Exam: pleasant obese amb bf with freq throat clearing.   wt 371> 378 July 04, 2010 > 376 July 18, 2010 >  05/24/2014  372  HEENT: nl dentition, turbinates, and orophanx. Nl external ear canals without cough reflex  NECK : without JVD/Nodes/TM/ nl carotid upstrokes bilaterally  LUNGS: no acc muscle use, clear to A and P bilaterally without cough on insp or exp maneuvers  CV: RRR no s3 or murmur or increase in P2, no edema  ABD: soft and nontender with nl excursion in the supine position. No bruits or organomegaly, bowel sounds nl  MS: warm without deformities, calf tenderness, cyanosis or clubbing  Neuro:  Intact    cxr 12/30/13  Borderline cardiomegaly and low lung volumes, without acute disease.      Assessment & Plan:

## 2014-05-24 NOTE — Patient Instructions (Signed)
The key to effective treatment for your cough is eliminating the non-stop cycle of cough you're stuck in long enough to let your airway heal completely and then see if there is anything still making you cough once you stop the cough suppression, but this should take no more than 5 days to figure out  First take delsym two tsp every 12 hours and supplement if needed with  tramadol 50 mg up to 2 every 4 hours to suppress the urge to cough at all or even clear your throat. Swallowing water or using ice chips/non mint and menthol containing candies (such as lifesavers or sugarless jolly ranchers) are also effective.  You should rest your voice and avoid activities that you know make you cough.  Once you have eliminated the cough for 3 straight days try reducing the tramadol first,  then the delsym as tolerated.    Prednisone 10 mg take  4 each am x 2 days,   2 each am x 2 days,  1 each am x 2 days and stop (this is to eliminate allergies and inflammation from coughing)  Protonix (pantoprazole) Take 30-60 min before first meal of the day and Pepcid 20 mg one bedtime plus chlorpheniramine 4 mg x 2 at bedtime (both available over the counter)  until cough is completely gone for at least a week without the need for cough suppression (ok to use during the day but will make you sleepy)   GERD (REFLUX)  is an extremely common cause of respiratory symptoms, many times with no significant heartburn at all.    It can be treated with medication, but also with lifestyle changes including avoidance of late meals, excessive alcohol, smoking cessation, and avoid fatty foods, chocolate, peppermint, colas, red wine, and acidic juices such as orange juice.  NO MINT OR MENTHOL PRODUCTS SO NO COUGH DROPS  USE HARD CANDY INSTEAD (jolley ranchers or Stover's or Lifesavers (all available in sugarless versions) NO OIL BASED VITAMINS - use powdered substitutes.  If not convinced better, next step is an asthma challenge test but  needs to be done while on acid suppression Kim Barnes(Libby [id number] methacholine challenge)

## 2014-05-25 DIAGNOSIS — R05 Cough: Secondary | ICD-10-CM | POA: Insufficient documentation

## 2014-05-25 DIAGNOSIS — R058 Other specified cough: Secondary | ICD-10-CM | POA: Insufficient documentation

## 2014-05-25 NOTE — Assessment & Plan Note (Addendum)
The most common causes of chronic cough in immunocompetent adults include the following: upper airway cough syndrome (UACS), previously referred to as postnasal drip syndrome (PNDS), which is caused by variety of rhinosinus conditions; (2) asthma; (3) GERD; (4) chronic bronchitis from cigarette smoking or other inhaled environmental irritants; (5) nonasthmatic eosinophilic bronchitis; and (6) bronchiectasis.   These conditions, singly or in combination, have accounted for up to 94% of the causes of chronic cough in prospective studies.   Other conditions have constituted no >6% of the causes in prospective studies These have included bronchogenic carcinoma, chronic interstitial pneumonia, sarcoidosis, left ventricular failure, ACEI-induced cough, and aspiration from a condition associated with pharyngeal dysfunction.    Chronic cough is often simultaneously caused by more than one condition. A single cause has been found from 38 to 82% of the time, multiple causes from 18 to 62%. Multiply caused cough has been the result of three diseases up to 42% of the time.       Based on hx and exam, this is most likely:  Classic Upper airway cough syndrome, so named because it's frequently impossible to sort out how much is  CR/sinusitis with freq throat clearing (which can be related to primary GERD)   vs  causing  secondary (" extra esophageal")  GERD from wide swings in gastric pressure that occur with throat clearing, often  promoting self use of mint and menthol lozenges that reduce the lower esophageal sphincter tone and exacerbate the problem further in a cyclical fashion.   These are the same pts (now being labeled as having "irritable larynx syndrome" by some cough centers) who not infrequently have a history of having failed to tolerate ace inhibitors,  dry powder inhalers or biphosphonates or report having atypical reflux symptoms that don't respond to standard doses of PPI , and are easily confused as  having aecopd or asthma flares by even experienced allergists/ pulmonologists.   The first step is to maximize acid suppression and eliminate cyclical coughing then regroup in 4 weeks to reassess ? Of asthma/ cough variant  See instructions for specific recommendations which were reviewed directly with the patient who was given a copy with highlighter outlining the key components.

## 2014-12-27 ENCOUNTER — Encounter (HOSPITAL_COMMUNITY): Payer: Self-pay | Admitting: Emergency Medicine

## 2014-12-27 ENCOUNTER — Emergency Department (HOSPITAL_COMMUNITY): Payer: 59

## 2014-12-27 ENCOUNTER — Emergency Department (HOSPITAL_COMMUNITY)
Admission: EM | Admit: 2014-12-27 | Discharge: 2014-12-27 | Disposition: A | Discharge status: Home / Self Care | Payer: 59 | Attending: Emergency Medicine | Admitting: Emergency Medicine

## 2014-12-27 DIAGNOSIS — Z79899 Other long term (current) drug therapy: Secondary | ICD-10-CM | POA: Insufficient documentation

## 2014-12-27 DIAGNOSIS — Z86011 Personal history of benign neoplasm of the brain: Secondary | ICD-10-CM | POA: Diagnosis not present

## 2014-12-27 DIAGNOSIS — K219 Gastro-esophageal reflux disease without esophagitis: Secondary | ICD-10-CM | POA: Diagnosis not present

## 2014-12-27 DIAGNOSIS — J45909 Unspecified asthma, uncomplicated: Secondary | ICD-10-CM | POA: Insufficient documentation

## 2014-12-27 DIAGNOSIS — R079 Chest pain, unspecified: Secondary | ICD-10-CM

## 2014-12-27 DIAGNOSIS — I10 Essential (primary) hypertension: Secondary | ICD-10-CM | POA: Diagnosis not present

## 2014-12-27 DIAGNOSIS — K2970 Gastritis, unspecified, without bleeding: Secondary | ICD-10-CM | POA: Insufficient documentation

## 2014-12-27 DIAGNOSIS — Z8669 Personal history of other diseases of the nervous system and sense organs: Secondary | ICD-10-CM | POA: Insufficient documentation

## 2014-12-27 LAB — CBC
HCT: 41.7 % (ref 36.0–46.0)
Hemoglobin: 13.4 g/dL (ref 12.0–15.0)
MCH: 28.7 pg (ref 26.0–34.0)
MCHC: 32.1 g/dL (ref 30.0–36.0)
MCV: 89.3 fL (ref 78.0–100.0)
PLATELETS: 285 10*3/uL (ref 150–400)
RBC: 4.67 MIL/uL (ref 3.87–5.11)
RDW: 14.8 % (ref 11.5–15.5)
WBC: 6 10*3/uL (ref 4.0–10.5)

## 2014-12-27 LAB — I-STAT TROPONIN, ED: Troponin i, poc: 0 ng/mL (ref 0.00–0.08)

## 2014-12-27 LAB — BASIC METABOLIC PANEL
Anion gap: 7 (ref 5–15)
BUN: 10 mg/dL (ref 6–23)
CO2: 31 mmol/L (ref 19–32)
CREATININE: 0.76 mg/dL (ref 0.50–1.10)
Calcium: 9.5 mg/dL (ref 8.4–10.5)
Chloride: 105 mEq/L (ref 96–112)
GFR calc Af Amer: >90 mL/min (ref >90)
GLUCOSE: 125 mg/dL — AB (ref 70–99)
Potassium: 3.6 mmol/L (ref 3.5–5.1)
SODIUM: 143 mmol/L (ref 135–145)

## 2014-12-27 MED ORDER — GI COCKTAIL ~~LOC~~
30.0000 mL | Freq: Once | ORAL | Status: DC
  Filled 2014-12-27: qty 30

## 2014-12-27 MED ORDER — ACETAMINOPHEN 325 MG PO TABS
650.0000 mg | ORAL_TABLET | Freq: Once | ORAL | Status: DC
  Filled 2014-12-27: qty 2

## 2014-12-27 NOTE — ED Provider Notes (Signed)
CSN: [id number]     Arrival date & time 12/27/14  1918 History   First MD Initiated Contact with Patient 12/27/14 2112     Chief Complaint  Patient presents with  . Chest Pain     (Consider location/radiation/quality/duration/timing/severity/associated sxs/prior Treatment) HPI  Kim Barnes is a 39 y.o. female who is here for evaluation of intermittent central chest pain present for 4 days, worse with turning and deep breathing, worsened by taking ibuprofen and improved with taking antacid.  No known trauma.  No fever, chills, cough, shortness of breath, weakness or dizziness.  She takes her lifestyle, hypertension, intermittently, she does not use hydrochlorothiazide, except as needed for leg swelling.  She denies leg swelling or pain at this time.  She is sedentary, and does not get much exercise.  There are no other known modifying factors.   Past Medical History  Diagnosis Date  . Obesity   . GERD (gastroesophageal reflux disease)     occasional; no current med.  [name] HTN (hypertension)     under control with med., has been on med. x 5 yr.  . History of brain tumor 1996    no neurologic deficits  . Asthma     prn inhaler  . Carpal tunnel syndrome of left wrist 01/2013  . Polycystic ovarian syndrome     no current med.; irregular periods   Past Surgical History  Procedure Laterality Date  . Brain surgery  1996    exc. tumor  . Hysteroscopy w/d&c  02/21/2006  . Tonsillectomy  09/21/2002  . Hysteroscopy w/d&c  12/17/2011    Procedure: DILATATION AND CURETTAGE /HYSTEROSCOPY;  Surgeon: Meriel Pica, MD;  Location: [facility];  Service: Gynecology;  Laterality: N/A;  . Leep  01/07/2004  . Carpal tunnel release Left 02/10/2013    Procedure: CARPAL TUNNEL RELEASE;  Surgeon: Nicki Reaper, MD;  Location: [facility];  Service: Orthopedics;  Laterality: Left;  ANESTHESIA: IV REGIONAL FAB   Family History  Problem Relation Age of Onset  . Hypertension Other   . Lung cancer  Father     Smoker  . Cancer Father     lung/ smoker  . Breast cancer Maternal Grandmother   . Cancer Maternal Grandmother     breast  . Emphysema Paternal Grandmother   . Asthma Mother   . Asthma Maternal Aunt    History  Substance Use Topics  . Smoking status: Never Smoker   . Smokeless tobacco: Never Used  . Alcohol Use: Yes     Comment: rarely   OB History    No data available     Review of Systems  All other systems reviewed and are negative.     Allergies  Review of patient's allergies indicates no known allergies.  Home Medications   Prior to Admission medications   Medication Sig Start Date End Date Taking? Authorizing Provider  famotidine (PEPCID) 20 MG tablet One at bedtime 05/24/14  Yes Nyoka Cowden, MD  ibuprofen (ADVIL,MOTRIN) 200 MG tablet Take 1,200 mg by mouth daily as needed for moderate pain (tooth pain).   Yes Historical Provider, MD  albuterol (PROAIR HFA) 108 (90 BASE) MCG/ACT inhaler Inhale 2 puffs into the lungs every 6 (six) hours as needed for wheezing. 10/01/13 06/16/15  Aleksei Plotnikov V, MD  hydrochlorothiazide (MICROZIDE) 12.5 MG capsule Take 1 capsule (12.5 mg total) by mouth daily. 11/16/13   Aleksei Plotnikov V, MD  pantoprazole (PROTONIX) 40 MG tablet Take 1  tablet (40 mg total) by mouth daily. Take 30-60 min before first meal of the day Patient not taking: Reported on 12/27/2014 05/24/14   Nyoka CowdenMichael B Wert, MD  predniSONE (DELTASONE) 10 MG tablet Take  4 each am x 2 days,   2 each am x 2 days,  1 each am x 2 days and stop Patient not taking: Reported on 12/27/2014 05/24/14   Nyoka CowdenMichael B Wert, MD  traMADol (ULTRAM) 50 MG tablet 1-2 every 4 hours as needed for cough or pain 05/24/14   Nyoka CowdenMichael B Wert, MD   BP 183/103 mmHg  Pulse 89  Temp(Src) 97.9 F (36.6 C) (Oral)  Resp 17  Ht 5\' 5"  (1.651 m)  Wt 371 lb (168.284 kg)  BMI 61.74 kg/m2  SpO2 99%  LMP 12/20/2014 (Exact Date) Physical Exam  Constitutional: She is oriented to person, place, and  time. She appears well-developed. No distress.  Morbidly obese  HENT:  Head: Normocephalic and atraumatic.  Right Ear: External ear normal.  Left Ear: External ear normal.  Eyes: Conjunctivae and EOM are normal. Pupils are equal, round, and reactive to light.  Neck: Normal range of motion and phonation normal. Neck supple.  Cardiovascular: Normal rate, regular rhythm and normal heart sounds.   Pulmonary/Chest: Effort normal and breath sounds normal. No respiratory distress. She has no wheezes. She exhibits no bony tenderness.  Chest wall is nontender to palpation.  Abdominal: Soft. There is no tenderness.  Musculoskeletal: Normal range of motion.  Neurological: She is alert and oriented to person, place, and time. No cranial nerve deficit or sensory deficit. She exhibits normal muscle tone. Coordination normal.  Skin: Skin is warm, dry and intact.  Psychiatric: She has a normal mood and affect. Her behavior is normal. Judgment and thought content normal.  Nursing note and vitals reviewed.   ED Course  Procedures (including critical care time)  Medications  acetaminophen (TYLENOL) tablet 650 mg (not administered)  gi cocktail (Maalox,Lidocaine,Donnatal) (not administered)    Patient Vitals for the past 24 hrs:  BP Temp Temp src Pulse Resp SpO2 Height Weight  12/27/14 1929 (!) 183/103 mmHg 97.9 F (36.6 C) Oral 89 17 99 % 5\' 5"  (1.651 m) (!) 371 lb (168.284 kg)    Findings discussed with patient, all questions answered.  She did not take the medications that were offered in the ED.  Labs Review Labs Reviewed  CBC  BASIC METABOLIC PANEL  I-STAT TROPOININ, ED    Imaging Review Dg Chest 2 View  12/27/2014   CLINICAL DATA:  Mid upper chest pain, right shoulder pain  EXAM: CHEST  2 VIEW  COMPARISON:  12/30/2013  FINDINGS: There is no focal parenchymal opacity, pleural effusion, or pneumothorax. The heart size is top normal.  The osseous structures are unremarkable.  IMPRESSION:  No active cardiopulmonary disease.   Electronically Signed   By: Elige KoHetal  Patel   On: 12/27/2014 19:50     EKG Interpretation   Date/Time:  Monday December 27 2014 19:33:47 EST Ventricular Rate:  88 PR Interval:  164 QRS Duration: 87 QT Interval:  339 QTC Calculation: 410 R Axis:   -6 Text Interpretation:  Sinus rhythm Probable left atrial enlargement  Nonspecific T abnrm, anterolateral leads Baseline wander in lead(s) II III  aVF Since last tracing Left atrial hypertophy , new Confirmed by Effie ShyWENTZ   MD, Markale Birdsell ([id number]) on 12/27/2014 9:14:37 PM      MDM   Final diagnoses:  Gastritis    Nonspecific upper abdominal,  and chest pain, most likely related to gastritis.  She has a history of GERD but is not currently taking any medication to control acid at this time.  Doubt ACS, PE or pneumonia.   Nursing Notes Reviewed/ Care Coordinated Applicable Imaging Reviewed Interpretation of Laboratory Data incorporated into ED treatment  The patient appears reasonably screened and/or stabilized for discharge and I doubt any other medical condition or other [facility] requiring further screening, evaluation, or treatment in the ED at this time prior to discharge.  Plan: Home Medications- Pepcid or Maalox prn; Home Treatments- rest; return here if the recommended treatment, does not improve the symptoms; Recommended follow up- PCP check up in 1-2 weeks    Flint Melter, MD 12/28/14 [phone]

## 2014-12-27 NOTE — Discharge Instructions (Signed)
Use Maalox 4 times a day, or Pepcid, twice a day to help your discomfort. Take your blood pressure medications, as prescribed. Avoid using ibuprofen. Use Tylenol, if needed, for pain.   Gastritis, Adult Gastritis is soreness and swelling (inflammation) of the lining of the stomach. Gastritis can develop as a sudden onset (acute) or long-term (chronic) condition. If gastritis is not treated, it can lead to stomach bleeding and ulcers. CAUSES  Gastritis occurs when the stomach lining is weak or damaged. Digestive juices from the stomach then inflame the weakened stomach lining. The stomach lining may be weak or damaged due to viral or bacterial infections. One common bacterial infection is the Helicobacter pylori infection. Gastritis can also result from excessive alcohol consumption, taking certain medicines, or having too much acid in the stomach.  SYMPTOMS  In some cases, there are no symptoms. When symptoms are present, they may include:  Pain or a burning sensation in the upper abdomen.  Nausea.  Vomiting.  An uncomfortable feeling of fullness after eating. DIAGNOSIS  Your caregiver may suspect you have gastritis based on your symptoms and a physical exam. To determine the cause of your gastritis, your caregiver may perform the following:  Blood or stool tests to check for the H pylori bacterium.  Gastroscopy. A thin, flexible tube (endoscope) is passed down the esophagus and into the stomach. The endoscope has a light and camera on the end. Your caregiver uses the endoscope to view the inside of the stomach.  Taking a tissue sample (biopsy) from the stomach to examine under a microscope. TREATMENT  Depending on the cause of your gastritis, medicines may be prescribed. If you have a bacterial infection, such as an H pylori infection, antibiotics may be given. If your gastritis is caused by too much acid in the stomach, H2 blockers or antacids may be given. Your caregiver may recommend  that you stop taking aspirin, ibuprofen, or other nonsteroidal anti-inflammatory drugs (NSAIDs). HOME CARE INSTRUCTIONS  Only take over-the-counter or prescription medicines as directed by your caregiver.  If you were given antibiotic medicines, take them as directed. Finish them even if you start to feel better.  Drink enough fluids to keep your urine clear or pale yellow.  Avoid foods and drinks that make your symptoms worse, such as:  Caffeine or alcoholic drinks.  Chocolate.  Peppermint or mint flavorings.  Garlic and onions.  Spicy foods.  Citrus fruits, such as oranges, lemons, or limes.  Tomato-based foods such as sauce, chili, salsa, and pizza.  Fried and fatty foods.  Eat small, frequent meals instead of large meals. SEEK IMMEDIATE MEDICAL CARE IF:   You have black or dark red stools.  You vomit blood or material that looks like coffee grounds.  You are unable to keep fluids down.  Your abdominal pain gets worse.  You have a fever.  You do not feel better after 1 week.  You have any other questions or concerns. MAKE SURE YOU:  Understand these instructions.  Will watch your condition.  Will get help right away if you are not doing well or get worse. Document Released: 11/20/2001 Document Revised: 05/27/2012 Document Reviewed: 01/09/2012 [facility] Patient Information 2015 [employer] [place]. This information is not intended to replace advice given to you by your health care provider. Make sure you discuss any questions you have with your health care provider.

## 2014-12-27 NOTE — ED Notes (Addendum)
Pt c/o mid upper chest pain radiating to right shoulder starting Friday. No hx cardiac problems. Hx asthma. Denies SOB at this time. Denies dizziness/lightheadedness/back pain. Hx HTN. Does not take medications as prescribed.

## 2015-02-01 ENCOUNTER — Encounter: Payer: Self-pay | Admitting: Family

## 2015-02-01 ENCOUNTER — Ambulatory Visit (INDEPENDENT_AMBULATORY_CARE_PROVIDER_SITE_OTHER): Payer: 59 | Admitting: Family

## 2015-02-01 VITALS — BP 160/100 | HR 95 | Temp 101.6°F | Resp 20 | Ht 65.0 in | Wt 386.0 lb

## 2015-02-01 DIAGNOSIS — R05 Cough: Secondary | ICD-10-CM

## 2015-02-01 DIAGNOSIS — R059 Cough, unspecified: Secondary | ICD-10-CM

## 2015-02-01 MED ORDER — AZITHROMYCIN 250 MG PO TABS: ORAL_TABLET | ORAL | Status: DC

## 2015-02-01 NOTE — Assessment & Plan Note (Signed)
Symptoms and exam consistent with acute bacterial bronchitis. Start azithromycin. Continue over-the-counter medications as needed for symptom relief. Follow-up if symptoms worsen or fail to improve.

## 2015-02-01 NOTE — Progress Notes (Signed)
Pre visit review using our clinic review tool, if applicable. No additional management support is needed unless otherwise documented below in the visit note. 

## 2015-02-01 NOTE — Progress Notes (Signed)
   Subjective:    Patient ID: Kim Barnes, female    DOB: 11/09/1976, 39 y.o.   MRN: [id number]  Chief Complaint  Patient presents with  . Fever    started sunday she had a scratchy throat and cough, yesterday she had the chills, fever, body aches, and productive cough of brown phlem, a little bit of  nausea    HPI:  Kim Barnes is a 39 y.o. female who presents today for an acute visit.   This is a new problem. Associated symptoms of scratchy throat, cough, fever, body aches and some nausea has been going on for about 3 days. Has tried Coricidin flu which provided some relief. Denies any recent antibiotic use.    No Known Allergies   Current Outpatient Prescriptions on File Prior to Visit  Medication Sig Dispense Refill  . albuterol (PROAIR HFA) 108 (90 BASE) MCG/ACT inhaler Inhale 2 puffs into the lungs every 6 (six) hours as needed for wheezing. 3 Inhaler 1  . famotidine (PEPCID) 20 MG tablet One at bedtime 30 tablet 2  . hydrochlorothiazide (MICROZIDE) 12.5 MG capsule Take 1 capsule (12.5 mg total) by mouth daily. 30 capsule 5  . ibuprofen (ADVIL,MOTRIN) 200 MG tablet Take 1,200 mg by mouth daily as needed for moderate pain (tooth pain).    . pantoprazole (PROTONIX) 40 MG tablet Take 1 tablet (40 mg total) by mouth daily. Take 30-60 min before first meal of the day 30 tablet 2  . predniSONE (DELTASONE) 10 MG tablet Take  4 each am x 2 days,   2 each am x 2 days,  1 each am x 2 days and stop 14 tablet 0  . traMADol (ULTRAM) 50 MG tablet 1-2 every 4 hours as needed for cough or pain 40 tablet 0   No current facility-administered medications on file prior to visit.    Review of Systems  HENT: Positive for sore throat (scratchy). Negative for congestion and sinus pressure.   Respiratory: Positive for cough and chest tightness (Only when coughing). Negative for shortness of breath and wheezing.   Neurological: Positive for headaches.      Objective:    BP 160/100 mmHg   Pulse 95  Temp(Src) 101.6 F (38.7 C) (Oral)  Resp 20  Ht 5\' 5"  (1.651 m)  Wt 386 lb (175.088 kg)  BMI 64.23 kg/m2  SpO2 99% Nursing note and vital signs reviewed.  Physical Exam  Constitutional: She is oriented to person, place, and time. She appears well-developed and well-nourished. No distress.  HENT:  Right Ear: Hearing, tympanic membrane, external ear and ear canal normal.  Left Ear: Hearing, tympanic membrane, external ear and ear canal normal.  Nose: Nose normal. Right sinus exhibits no maxillary sinus tenderness and no frontal sinus tenderness. Left sinus exhibits no maxillary sinus tenderness and no frontal sinus tenderness.  Mouth/Throat: Uvula is midline, oropharynx is clear and moist and mucous membranes are normal.  Neck: Neck supple.  Cardiovascular: Normal rate, regular rhythm, normal heart sounds and intact distal pulses.   Pulmonary/Chest: Effort normal and breath sounds normal.  Lymphadenopathy:    She has no cervical adenopathy.  Neurological: She is alert and oriented to person, place, and time.  Skin: Skin is warm and dry.  Psychiatric: She has a normal mood and affect. Her behavior is normal. Judgment and thought content normal.       Assessment & Plan:

## 2015-02-01 NOTE — Patient Instructions (Signed)
Thank you for choosing [facility].  Summary/Instructions:  Your prescription(s) have been submitted to your pharmacy or been printed and provided for you. Please take as directed and contact our office if you believe you are having problem(s) with the medication(s) or have any questions.  If your symptoms worsen or fail to improve, please contact our office for further instruction, or in case of emergency go directly to the emergency room at the closest medical facility.   General Recommendations:    Please drink plenty of fluids.  Get plenty of rest   Sleep in humidified air  Use saline nasal sprays  Netti pot   OTC Medications:  Decongestants - helps relieve congestion   Flonase (generic fluticasone) or Nasacort (generic triamcinolone) - please make sure to use the "cross-over" technique at a 45 degree angle towards the opposite eye as opposed to straight up the nasal passageway.   Sudafed (generic pseudoephedrine - Note this is the one that is available behind the pharmacy counter); Products with phenylephrine (-PE) may also be used but is often not as effective as pseudoephedrine.   If you have HIGH BLOOD PRESSURE - Coricidin HBP; AVOID any product that is -D as this contains pseudoephedrine which may increase your blood pressure.  Afrin (oxymetazoline) every 6-8 hours for up to 3 days.   Allergies - helps relieve runny nose, itchy eyes and sneezing   Claritin (generic loratidine), Allegra (fexofenidine), or Zyrtec (generic cyrterizine) for runny nose. These medications should not cause drowsiness.  Note - Benadryl (generic diphenhydramine) may be used however may cause drowsiness  Cough -   Delsym or Robitussin (generic dextromethorphan)  Expectorants - helps loosen mucus to ease removal   Mucinex (generic guaifenesin) as directed on the package.  Headaches / General Aches   Tylenol (generic acetaminophen) - DO NOT EXCEED 3 grams (3,000 mg) in a 24  hour time period  Advil/Motrin (generic ibuprofen)   Sore Throat -   Salt water gargle   Chloraseptic (generic benzocaine) spray or lozenges / Sucrets (generic dyclonine)      

## 2015-03-31 ENCOUNTER — Other Ambulatory Visit (INDEPENDENT_AMBULATORY_CARE_PROVIDER_SITE_OTHER): Payer: 59

## 2015-03-31 ENCOUNTER — Encounter: Payer: Self-pay | Admitting: Internal Medicine

## 2015-03-31 ENCOUNTER — Ambulatory Visit (INDEPENDENT_AMBULATORY_CARE_PROVIDER_SITE_OTHER): Payer: 59 | Admitting: Internal Medicine

## 2015-03-31 VITALS — BP 140/90 | HR 85 | Ht 65.0 in | Wt 385.0 lb

## 2015-03-31 DIAGNOSIS — I10 Essential (primary) hypertension: Secondary | ICD-10-CM | POA: Diagnosis not present

## 2015-03-31 DIAGNOSIS — E282 Polycystic ovarian syndrome: Secondary | ICD-10-CM

## 2015-03-31 DIAGNOSIS — Z0000 Encounter for general adult medical examination without abnormal findings: Secondary | ICD-10-CM

## 2015-03-31 DIAGNOSIS — R739 Hyperglycemia, unspecified: Secondary | ICD-10-CM

## 2015-03-31 DIAGNOSIS — R0683 Snoring: Secondary | ICD-10-CM | POA: Diagnosis not present

## 2015-03-31 DIAGNOSIS — N912 Amenorrhea, unspecified: Secondary | ICD-10-CM

## 2015-03-31 DIAGNOSIS — Z23 Encounter for immunization: Secondary | ICD-10-CM

## 2015-03-31 LAB — HEMOGLOBIN A1C: Hgb A1c MFr Bld: 5.3 % (ref 4.6–6.5)

## 2015-03-31 LAB — BASIC METABOLIC PANEL
BUN: 8 mg/dL (ref 6–23)
CALCIUM: 9.3 mg/dL (ref 8.4–10.5)
CO2: 26 mEq/L (ref 19–32)
Chloride: 103 mEq/L (ref 96–112)
Creatinine, Ser: 0.79 mg/dL (ref 0.40–1.20)
GFR: 105.43 mL/min (ref >60.00)
Glucose, Bld: 85 mg/dL (ref 70–99)
Potassium: 3.2 mEq/L — ABNORMAL LOW (ref 3.5–5.1)
Sodium: 139 mEq/L (ref 135–145)

## 2015-03-31 LAB — URINALYSIS, ROUTINE W REFLEX MICROSCOPIC
Bilirubin Urine: NEGATIVE
NITRITE: POSITIVE — AB
Specific Gravity, Urine: 1.015 (ref 1.000–1.030)
Total Protein, Urine: >=300 — AB
UROBILINOGEN UA: 2 — AB (ref 0.0–1.0)
Urine Glucose: NEGATIVE
pH: 8 (ref 5.0–8.0)

## 2015-03-31 LAB — CBC WITH DIFFERENTIAL/PLATELET
BASOS PCT: 0.4 % (ref 0.0–3.0)
Basophils Absolute: 0 10*3/uL (ref 0.0–0.1)
EOS PCT: 2 % (ref 0.0–5.0)
Eosinophils Absolute: 0.1 10*3/uL (ref 0.0–0.7)
HCT: 34.8 % — ABNORMAL LOW (ref 36.0–46.0)
Hemoglobin: 11.7 g/dL — ABNORMAL LOW (ref 12.0–15.0)
Lymphocytes Relative: 28.2 % (ref 12.0–46.0)
Lymphs Abs: 1.8 10*3/uL (ref 0.7–4.0)
MCHC: 33.7 g/dL (ref 30.0–36.0)
MCV: 86.8 fl (ref 78.0–100.0)
Monocytes Absolute: 0.4 10*3/uL (ref 0.1–1.0)
Monocytes Relative: 5.8 % (ref 3.0–12.0)
NEUTROS ABS: 4 10*3/uL (ref 1.4–7.7)
NEUTROS PCT: 63.6 % (ref 43.0–77.0)
Platelets: 304 10*3/uL (ref 150.0–400.0)
RBC: 4.01 Mil/uL (ref 3.87–5.11)
RDW: 16.2 % — ABNORMAL HIGH (ref 11.5–15.5)
WBC: 6.2 10*3/uL (ref 4.0–10.5)

## 2015-03-31 LAB — HEPATIC FUNCTION PANEL
ALT: 23 U/L (ref 0–35)
AST: 23 U/L (ref 0–37)
Albumin: 4.2 g/dL (ref 3.5–5.2)
Alkaline Phosphatase: 58 U/L (ref 39–117)
BILIRUBIN DIRECT: 0.1 mg/dL (ref 0.0–0.3)
TOTAL PROTEIN: 7.7 g/dL (ref 6.0–8.3)
Total Bilirubin: 0.6 mg/dL (ref 0.2–1.2)

## 2015-03-31 LAB — LIPID PANEL
Cholesterol: 183 mg/dL (ref 0–200)
HDL: 39.5 mg/dL (ref >39.00)
LDL CALC: 131 mg/dL — AB (ref 0–99)
NonHDL: 143.5
TRIGLYCERIDES: 62 mg/dL (ref 0.0–149.0)
Total CHOL/HDL Ratio: 5
VLDL: 12.4 mg/dL (ref 0.0–40.0)

## 2015-03-31 LAB — TESTOSTERONE: Testosterone: 40.97 ng/dL — ABNORMAL HIGH (ref 15.00–40.00)

## 2015-03-31 MED ORDER — ALBUTEROL SULFATE HFA 108 (90 BASE) MCG/ACT IN AERS: 2.0000 | INHALATION_SPRAY | Freq: Four times a day (QID) | RESPIRATORY_TRACT | Status: DC | PRN

## 2015-03-31 MED ORDER — NEBIVOLOL HCL 20 MG PO TABS: 20.0000 mg | ORAL_TABLET | Freq: Every day | ORAL | Status: DC

## 2015-03-31 MED ORDER — METFORMIN HCL 500 MG PO TABS: 500.0000 mg | ORAL_TABLET | Freq: Every day | ORAL | Status: DC

## 2015-03-31 NOTE — Assessment & Plan Note (Signed)
We discussed age appropriate health related issues, including available/recomended screening tests and vaccinations. We discussed a need for adhering to healthy diet and exercise. Labs/EKG were reviewed/ordered. All questions were answered.  Labs 

## 2015-03-31 NOTE — Assessment & Plan Note (Signed)
A1c Start Metformin

## 2015-03-31 NOTE — Assessment & Plan Note (Signed)
On NAS diet Test for OSA Labs

## 2015-03-31 NOTE — Progress Notes (Signed)
  Subjective:    HPI  The patient is here for a wellness exam. The patient has been doing well overall without major physical or psychological issues going on lately.  C/o elev sugar, PCOS w/vag bleeding - off Metformin. On BCP C/o snoring  Wt Readings from Last 3 Encounters:  03/31/15 385 lb (174.635 kg)  02/01/15 386 lb (175.088 kg)  12/27/14 371 lb (168.284 kg)   BP Readings from Last 3 Encounters:  03/31/15 140/90  02/01/15 160/100  12/27/14 176/102      Review of Systems  Constitutional: Negative for chills, activity change, appetite change, fatigue and unexpected weight change.  HENT: Negative for congestion, mouth sores and sinus pressure.   Eyes: Negative for visual disturbance.  Respiratory: Positive for cough and wheezing. Negative for chest tightness and shortness of breath.   Gastrointestinal: Negative for nausea and abdominal pain.  Genitourinary: Negative for frequency, difficulty urinating and vaginal pain.  Musculoskeletal: Negative for back pain and gait problem.  Skin: Negative for pallor and rash.  Neurological: Negative for dizziness, tremors, weakness, numbness and headaches.  Psychiatric/Behavioral: Negative for confusion and sleep disturbance.       Objective:   Physical Exam  Constitutional: She appears well-developed. No distress.  obese  HENT:  Head: Normocephalic.  Right Ear: External ear normal.  Left Ear: External ear normal.  Nose: Nose normal.  Mouth/Throat: Oropharynx is clear and moist.  Eyes: Conjunctivae are normal. Pupils are equal, round, and reactive to light. Right eye exhibits no discharge. Left eye exhibits no discharge.  Neck: Normal range of motion. Neck supple. No JVD present. No tracheal deviation present. No thyromegaly present.  Cardiovascular: Normal rate, regular rhythm and normal heart sounds.   Pulmonary/Chest: No stridor. No respiratory distress. She has no wheezes.  Abdominal: Soft. Bowel sounds are normal. She  exhibits no distension and no mass. There is no tenderness. There is no rebound and no guarding.  Musculoskeletal: She exhibits no edema or tenderness.  Lymphadenopathy:    She has no cervical adenopathy.  Neurological: She displays normal reflexes. No cranial nerve deficit. She exhibits normal muscle tone. Coordination normal.  Skin: No rash noted. No erythema.  Psychiatric: She has a normal mood and affect. Her behavior is normal. Judgment and thought content normal.         Assessment & Plan:

## 2015-03-31 NOTE — Assessment & Plan Note (Signed)
On BCP Start Metformin  Labs

## 2015-03-31 NOTE — Assessment & Plan Note (Signed)
Declined Lap band before

## 2015-03-31 NOTE — Progress Notes (Signed)
Pre visit review using our clinic review tool, if applicable. No additional management support is needed unless otherwise documented below in the visit note. 

## 2015-03-31 NOTE — Assessment & Plan Note (Signed)
Dr Alva  

## 2015-04-04 ENCOUNTER — Other Ambulatory Visit: Payer: Self-pay

## 2015-04-04 ENCOUNTER — Other Ambulatory Visit: Payer: Self-pay | Admitting: Internal Medicine

## 2015-04-04 MED ORDER — METFORMIN HCL 500 MG PO TABS: 500.0000 mg | ORAL_TABLET | Freq: Every day | ORAL | Status: DC

## 2015-04-04 MED ORDER — POTASSIUM CHLORIDE CRYS ER 20 MEQ PO TBCR: 20.0000 meq | EXTENDED_RELEASE_TABLET | Freq: Every day | ORAL | Status: DC

## 2015-04-25 IMAGING — CR DG CHEST 2V
2 series · 2 of 2 positions shown · non-contrast
Comparison: CT ANGIO CHEST W/CM &/OR WO/CM dated 05/12/2009

CLINICAL DATA: Chest pain.

EXAM:
CHEST  2 VIEW

[w chest pa]
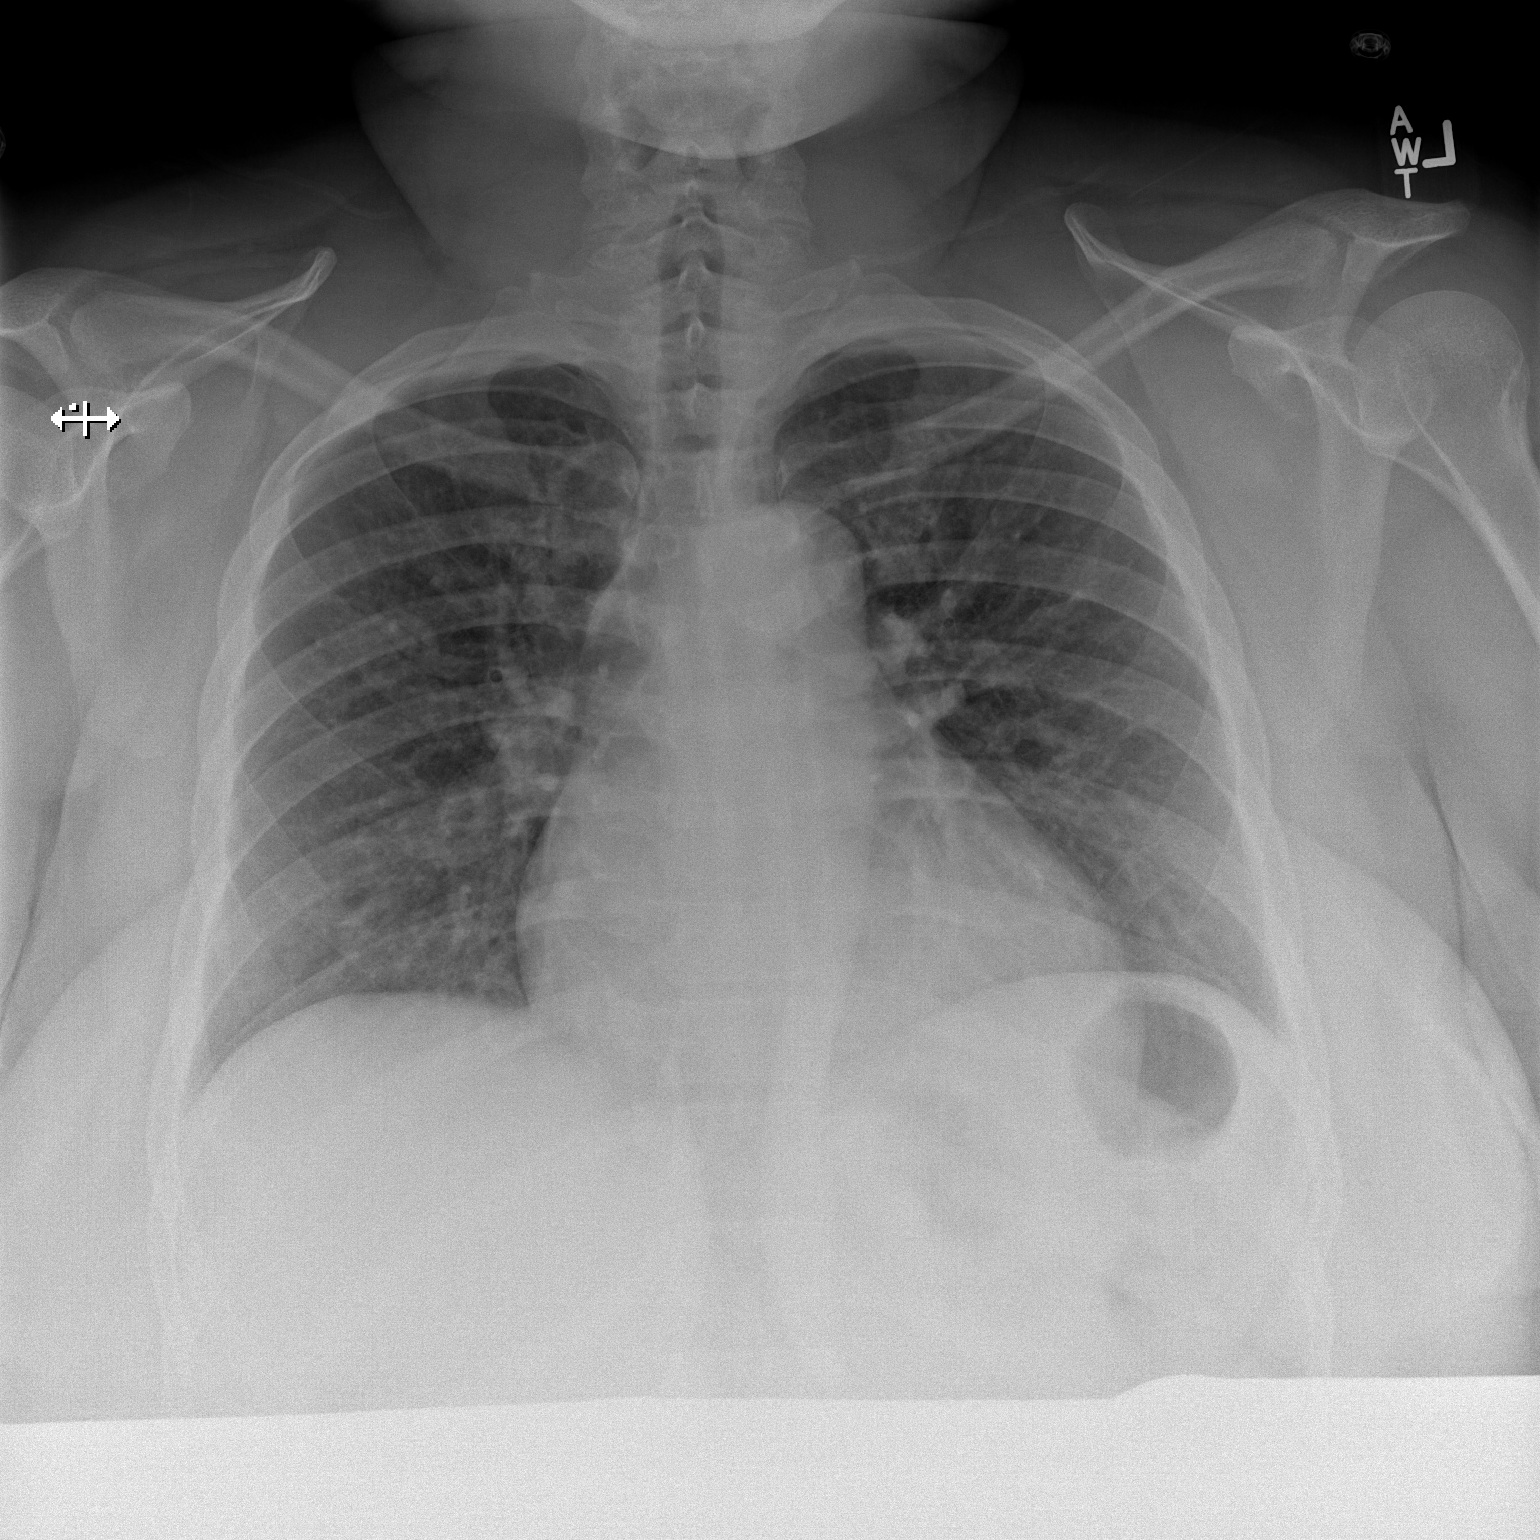

[w chest lat]
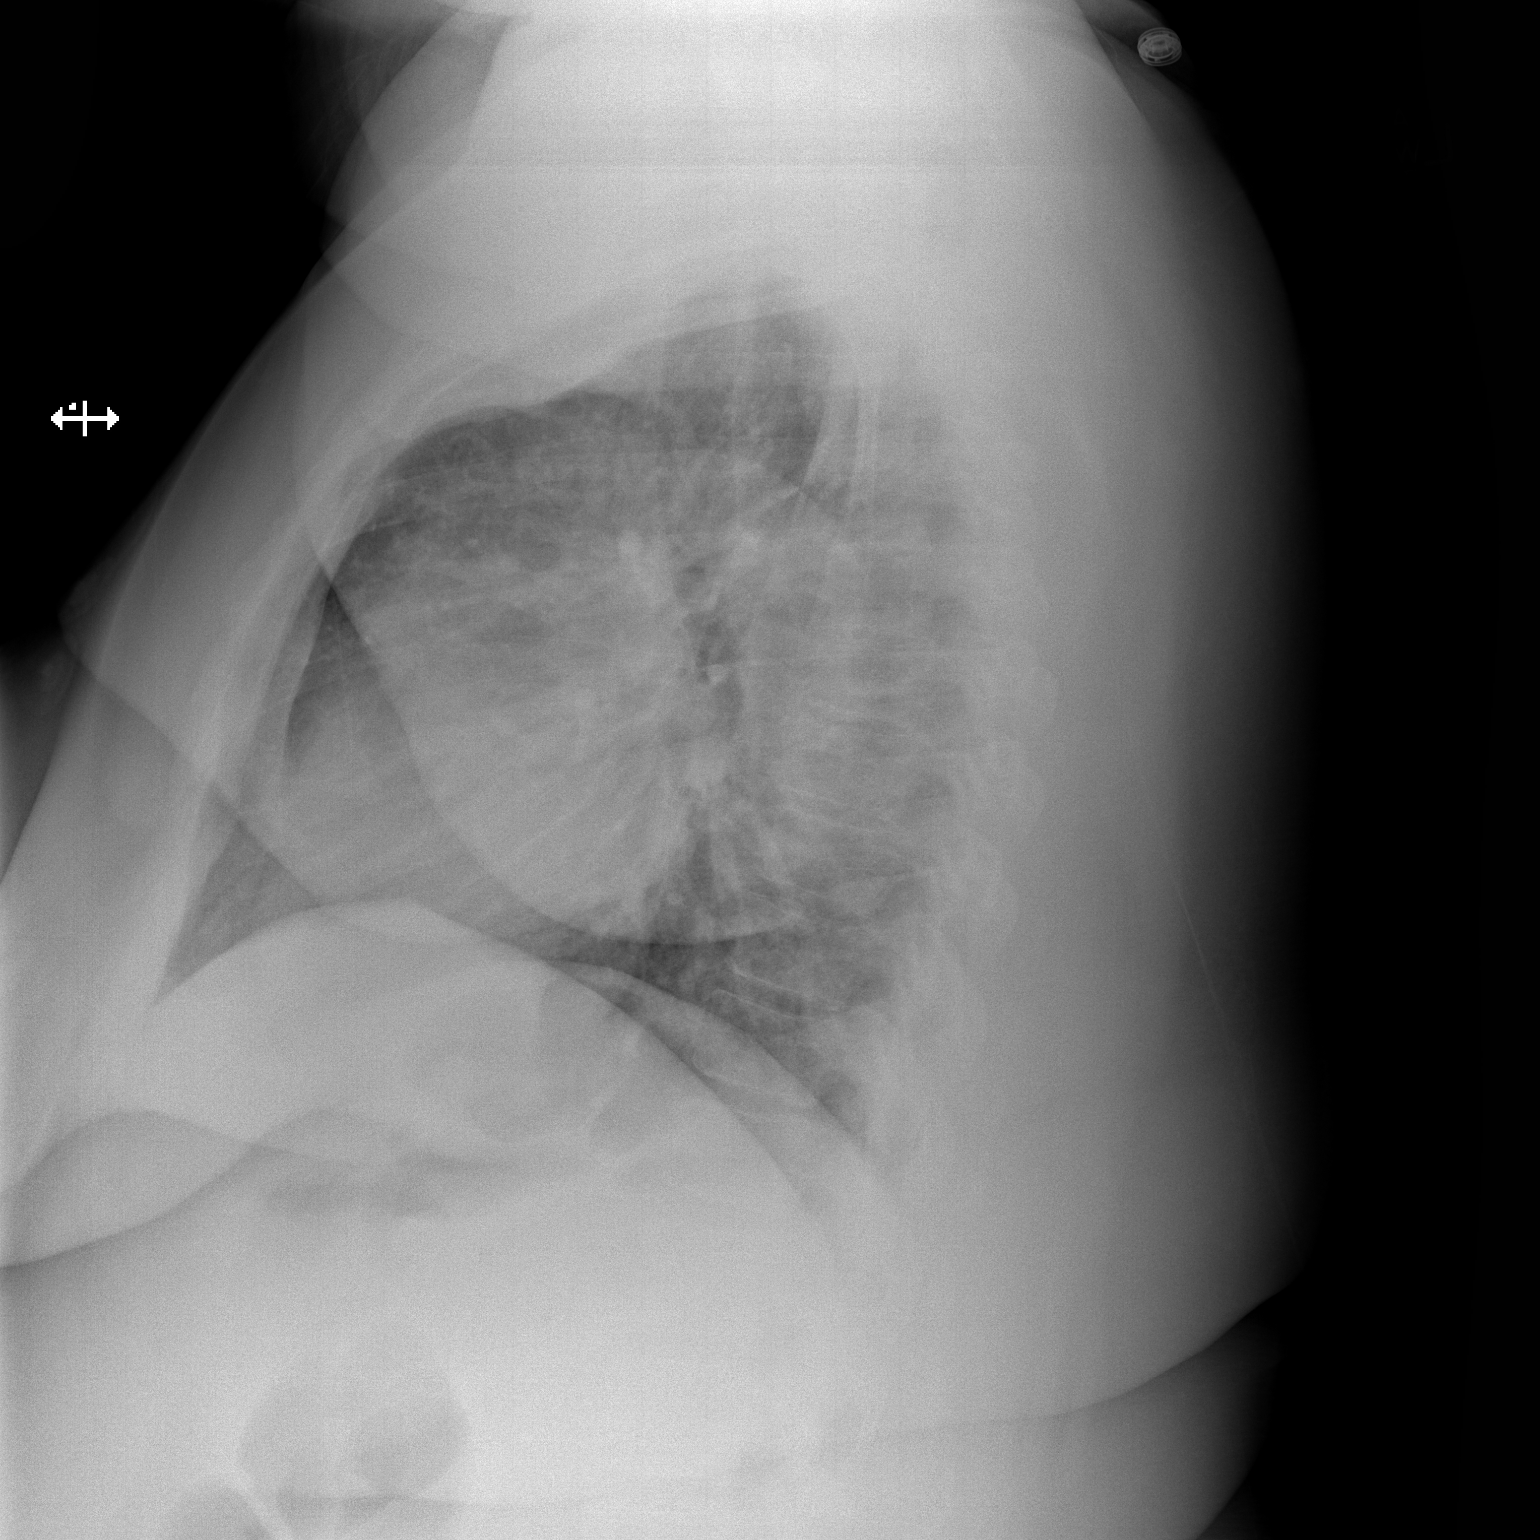

[2 of 2 positions shown; findings below may reference images not displayed]

FINDINGS: Lateral view degraded by patient arm position and body habitus.
Midline trachea. Borderline cardiomegaly. Mediastinal contours
otherwise within normal limits. No pleural effusion or pneumothorax.
Low lung volumes with resultant pulmonary interstitial prominence.
Clear lungs.
IMPRESSION: Borderline cardiomegaly and low lung volumes, without acute disease.

## 2015-05-26 ENCOUNTER — Ambulatory Visit (INDEPENDENT_AMBULATORY_CARE_PROVIDER_SITE_OTHER): Payer: 59 | Admitting: Pulmonary Disease

## 2015-05-26 ENCOUNTER — Encounter: Payer: Self-pay | Admitting: Pulmonary Disease

## 2015-05-26 VITALS — BP 182/126 | HR 81 | Temp 97.8°F | Ht 65.0 in | Wt 389.0 lb

## 2015-05-26 DIAGNOSIS — R0683 Snoring: Secondary | ICD-10-CM

## 2015-05-26 DIAGNOSIS — I10 Essential (primary) hypertension: Secondary | ICD-10-CM

## 2015-05-26 NOTE — Assessment & Plan Note (Signed)
Given excessive daytime somnolence, narrow pharyngeal exam, witnessed apneas & loud snoring, obstructive sleep apnea is very likely & an overnight polysomnogram will be scheduled as a home study. The pathophysiology of obstructive sleep apnea , it's cardiovascular consequences & modes of treatment including CPAP were discused with the patient in detail & they evidenced understanding.  Pretest probability is high 

## 2015-05-26 NOTE — Progress Notes (Signed)
Subjective:    Patient ID: Kim Barnes, female    DOB: [birth year], 39 y.o.   MRN: [id number]  HPI  39 year old obese nonsmoker presents for evaluation of sleep-disordered breathing. Loud snoring has been noted by her boyfriend, she however denies gasping or choking episodes in her sleep or witnessed apneas she has gained 50 pounds in the last 2 years.  Epworth sleepiness score is 0-but I feel that she is underreporting. 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 There is no history suggestive of cataplexy, sleep paralysis or parasomnias  She was Seen 05/2014 by MW for [facility] She did not take her blood pressure medicines for a week- no clear reason, denies side effects-her blood pressure is 180/120 today, 170/110 on recheck-denies headaches or blurred vision. She has hypertension requiring 2 medications-Bystolic and HCTZ-reports that it is controlled when she takes them   Past Medical History  Diagnosis Date  . Obesity   . GERD (gastroesophageal reflux disease)     occasional; no current med.  [name] HTN (hypertension)     under control with med., has been on med. x 5 yr.  . History of brain tumor 1996    no neurologic deficits  . Asthma     prn inhaler  . Carpal tunnel syndrome of left wrist 01/2013  . Polycystic ovarian syndrome     no current med.; irregular periods    Past Surgical History  Procedure Laterality Date  . Brain surgery  1996    exc. tumor  . Hysteroscopy w/d&c  02/21/2006  . Tonsillectomy  09/21/2002  . Hysteroscopy w/d&c  12/17/2011    Procedure: DILATATION AND CURETTAGE /HYSTEROSCOPY;  Surgeon: Meriel Pica, MD;  Location: [facility];  Service: Gynecology;   Laterality: N/A;  . Leep  01/07/2004  . Carpal tunnel release Left 02/10/2013    Procedure: CARPAL TUNNEL RELEASE;  Surgeon: Nicki Reaper, MD;  Location: [facility];  Service: Orthopedics;  Laterality: Left;  ANESTHESIA: IV REGIONAL FAB    No Known Allergies  History   Social History  . Marital Status: Single    Spouse Name: N/A  . Number of Children: N/A  . Years of Education: N/A   Occupational History  . 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 Smoking status: Never Smoker   . Smokeless tobacco: Never Used  . Alcohol Use: Yes     Comment: rarely  . Drug Use: No  . Sexual Activity: Yes    Birth Control/ Protection: Pill   Other Topics Concern  . Not on file   Social History Narrative    Family History  Problem Relation Age of Onset  . Hypertension Other   . Lung cancer Father     Smoker  . Cancer Father     lung/ smoker  . Breast cancer Maternal Grandmother   . Cancer Maternal Grandmother     breast  . Emphysema Paternal Grandmother   . Asthma Mother   . Asthma Maternal Aunt      Review of Systems neg for any significant sore throat, dysphagia, itching, sneezing, nasal congestion or excess/ purulent  secretions, fever, chills, sweats, unintended wt loss, pleuritic or exertional cp, hempoptysis, orthopnea pnd or change in chronic leg swelling. Also denies presyncope, palpitations, heartburn, abdominal pain, nausea, vomiting, diarrhea or change in bowel or urinary habits, dysuria,hematuria, rash, arthralgias, visual complaints, headache, numbness weakness or ataxia.     Objective:   Physical Exam  Gen. Pleasant, obese, in no distress, normal affect ENT - no lesions, no post nasal drip, class 2-3 airway Neck: No JVD, no thyromegaly, no carotid bruits Lungs: no use of accessory muscles, no dullness to percussion, decreased without rales or rhonchi  Cardiovascular: Rhythm regular, heart sounds  normal, no murmurs or gallops, no peripheral  edema Abdomen: soft and non-tender, no hepatosplenomegaly, BS normal. Musculoskeletal: No deformities, no cyanosis or clubbing Neuro:  alert, non focal, no tremors       Assessment & Plan:

## 2015-05-26 NOTE — Progress Notes (Signed)
   Subjective:    Patient ID: Kim Barnes, female    DOB: [birth year], 39 y.o.   MRN: [id number]  HPI    Review of Systems  Constitutional: Negative for fever, chills and unexpected weight change.  HENT: Negative for congestion, dental problem, ear pain, nosebleeds, postnasal drip, rhinorrhea, sinus pressure, sneezing, sore throat, trouble swallowing and voice change.   Eyes: Negative for visual disturbance.  Respiratory: Negative for cough, choking and shortness of breath.   Cardiovascular: Negative for chest pain and leg swelling.  Gastrointestinal: Negative for vomiting, abdominal pain and diarrhea.  Genitourinary: Negative for difficulty urinating.  Musculoskeletal: Negative for arthralgias.  Skin: Negative for rash.  Neurological: Negative for tremors, syncope and headaches.  Hematological: Does not bruise/bleed easily.       Objective:   Physical Exam        Assessment & Plan:

## 2015-05-26 NOTE — Assessment & Plan Note (Signed)
She will go home today and take her Bystolic and HCTZ and recheck her blood pressure She'll follow-up with PCP

## 2015-05-26 NOTE — Patient Instructions (Signed)
Home sleep test Take your bystolic & HCTZ today & daily

## 2015-06-09 ENCOUNTER — Telehealth: Payer: Self-pay | Admitting: Pulmonary Disease

## 2015-06-09 DIAGNOSIS — R0683 Snoring: Secondary | ICD-10-CM

## 2015-06-09 NOTE — Telephone Encounter (Signed)
HST was not ordered from last OV 05/26/15. Order now placed. PCC's please advise thanks

## 2015-06-10 NOTE — Telephone Encounter (Signed)
Spoke to pt she is aware precert pending for hst and she will be called probably next week Tobe SosSally E [place]

## 2015-06-24 ENCOUNTER — Telehealth: Payer: Self-pay | Admitting: Pulmonary Disease

## 2015-06-24 DIAGNOSIS — G4733 Obstructive sleep apnea (adult) (pediatric): Secondary | ICD-10-CM

## 2015-06-24 DIAGNOSIS — G4730 Sleep apnea, unspecified: Secondary | ICD-10-CM | POA: Diagnosis not present

## 2015-06-24 NOTE — Telephone Encounter (Signed)
Last OV : 05/26/15 AHI 15/hr Needs CPAP Titration Study 

## 2015-06-24 NOTE — Telephone Encounter (Signed)
Left message with pt's mom to call back.

## 2015-06-24 NOTE — Telephone Encounter (Signed)
Pt returned call - [phone]

## 2015-06-25 DIAGNOSIS — G4730 Sleep apnea, unspecified: Secondary | ICD-10-CM | POA: Diagnosis not present

## 2015-06-27 NOTE — Telephone Encounter (Signed)
Patient notified. CPAP Titration ordered. Nothing further needed.

## 2015-06-28 ENCOUNTER — Other Ambulatory Visit: Payer: Self-pay | Admitting: *Deleted

## 2015-06-28 DIAGNOSIS — R0683 Snoring: Secondary | ICD-10-CM

## 2015-08-23 ENCOUNTER — Telehealth: Payer: Self-pay | Admitting: Pulmonary Disease

## 2015-08-23 NOTE — Telephone Encounter (Signed)
Records have been faxed to [facility] E [facility]tinger

## 2015-08-23 NOTE — Telephone Encounter (Signed)
Please advise PCC;s thanks 

## 2015-09-04 ENCOUNTER — Ambulatory Visit (HOSPITAL_BASED_OUTPATIENT_CLINIC_OR_DEPARTMENT_OTHER): Payer: 59 | Attending: Pulmonary Disease

## 2015-09-04 VITALS — Ht 65.0 in | Wt 389.0 lb

## 2015-09-04 DIAGNOSIS — G4733 Obstructive sleep apnea (adult) (pediatric): Secondary | ICD-10-CM | POA: Insufficient documentation

## 2015-09-04 DIAGNOSIS — R0683 Snoring: Secondary | ICD-10-CM | POA: Diagnosis not present

## 2015-09-07 ENCOUNTER — Telehealth: Payer: Self-pay | Admitting: Pulmonary Disease

## 2015-09-07 DIAGNOSIS — G4733 Obstructive sleep apnea (adult) (pediatric): Secondary | ICD-10-CM

## 2015-09-07 NOTE — Progress Notes (Signed)
Patient Name: Mackenzye, Mackel Date: 09/04/2015 Gender: Female D.O.B: March 08, 1976 Age (years): 39 Referring Provider: Cyril Mourning MD, ABSM Height (inches): 65 Interpreting Physician: Cyril Mourning MD, ABSM Weight (lbs): 389 RPSGT: Melburn Popper BMI: 65 MRN: [id number] Neck Size: 16.00   CLINICAL INFORMATION The patient is referred for a CPAP titration to treat sleep apnea.  Date of  HST:06/2015 Showed moderate OSA with AHI of 15/hour  SLEEP STUDY TECHNIQUE As per the AASM Manual for the Scoring of Sleep and Associated Events v2.3 (April 2016) with a hypopnea requiring 4% desaturations.  The channels recorded and monitored were frontal, central and occipital EEG, electrooculogram (EOG), submentalis EMG (chin), nasal and oral airflow, thoracic and abdominal wall motion, anterior tibialis EMG, snore microphone, electrocardiogram, and pulse oximetry. Continuous positive airway pressure (CPAP) was initiated at the beginning of the study and titrated to treat sleep-disordered breathing.  MEDICATIONS Medications administered by patient during sleep study : No sleep medicine administered.   RESPIRATORY PARAMETERS Optimal PAP Pressure (cm): 15 AHI at Optimal Pressure (/hr): 1.4 Overall Minimal O2 (%): 90.00 Supine % at Optimal Pressure (%): 100 Minimal O2 at Optimal Pressure (%): 95.0     SLEEP ARCHITECTURE The study was initiated at 10:51:50 PM and ended at 4:55:12 AM.  Sleep onset time was 6.8 minutes and the sleep efficiency was 89.4%. The total sleep time was 325.0 minutes.  The patient spent 0.62% of the night in stage N1 sleep, 54.00% in stage N2 sleep, 25.23% in stage N3 and 20.15% in REM.Stage REM latency was 112.5 minutes  Wake after sleep onset was 31.5. Alpha intrusion was absent. Supine sleep was 49.41%.  CARDIAC DATA The 2 lead EKG demonstrated sinus rhythm. The mean heart rate was N/A beats per minute. Other EKG findings include: None.   LEG MOVEMENT DATA The  total Periodic Limb Movements of Sleep (PLMS) were 0. The PLMS index was 0.00. A PLMS index of <15 is considered normal in adults.  IMPRESSIONS The optimal PAP pressure was 15 cm of water. Central sleep apnea was not noted during this titration (CAI = 0.2/h). Significant oxygen desaturations were not observed during this titration (min O2 = 90.00%). The patient snored with Loud snoring volume during this titration study. No cardiac abnormalities were observed during this study. Clinically significant periodic limb movements were not noted during this study. Arousals associated with PLMs were rare.  DIAGNOSIS Obstructive Sleep Apnea (327.23 [G47.33 ICD-10])   RECOMMENDATIONS Trial of CPAP therapy on 15 cm H2O with a Medium size Fisher&Paykel Full Face Mask Simplus mask and heated humidification. Avoid alcohol, sedatives and other CNS depressants that may worsen sleep apnea and disrupt normal sleep architecture. Sleep hygiene should be reviewed to assess factors that may improve sleep quality. Weight management and regular exercise should be initiated or continued. Return for re-evaluation after 4 weeks of therapy  Oretha Milch. MD

## 2015-09-07 NOTE — Telephone Encounter (Signed)
Send Rx for CPAP therapy on 15 cm H2O with a Medium size Fisher&Paykel Full Face Mask Simplus mask and  Humidification -Download in 4 weeks Office visit with TP in 6 weeks

## 2015-09-07 NOTE — Addendum Note (Signed)
Addended by: Oretha Milch on: 09/07/2015 05:21 PM   Modules accepted: Level of Service

## 2015-09-08 NOTE — Telephone Encounter (Signed)
Patient notified. Patient scheduled to see TP on 11/10 in [facility] office. Order entered for CPAP.  Nothing further needed.

## 2015-09-13 ENCOUNTER — Ambulatory Visit (HOSPITAL_BASED_OUTPATIENT_CLINIC_OR_DEPARTMENT_OTHER): Payer: 59

## 2015-10-20 ENCOUNTER — Ambulatory Visit (INDEPENDENT_AMBULATORY_CARE_PROVIDER_SITE_OTHER): Payer: 59 | Admitting: Adult Health

## 2015-10-20 ENCOUNTER — Encounter: Payer: Self-pay | Admitting: Adult Health

## 2015-10-20 VITALS — BP 150/70 | HR 78 | Temp 98.0°F | Ht 65.0 in | Wt 386.0 lb

## 2015-10-20 DIAGNOSIS — G4733 Obstructive sleep apnea (adult) (pediatric): Secondary | ICD-10-CM | POA: Insufficient documentation

## 2015-10-20 DIAGNOSIS — Z9989 Dependence on other enabling machines and devices: Secondary | ICD-10-CM

## 2015-10-20 NOTE — Patient Instructions (Signed)
Wear CPAP At bedtime   Goal is to wear for at least 6 hours. Do not drive for sleepy Work on weight loss. Follow with Dr. Vassie LollAlva in 3 months and as needed

## 2015-10-20 NOTE — Progress Notes (Signed)
   Subjective:    Patient ID: Kim Barnes, female    DOB: 07/11/1976, 39 y.o.   MRN: [id number]  HPI    Review of Systems     Objective:   Physical Exam        Assessment & Plan:

## 2015-10-20 NOTE — Assessment & Plan Note (Signed)
Work on weight loss.

## 2015-10-20 NOTE — Progress Notes (Signed)
   Subjective:    Patient ID: Kim Barnes, female    DOB: 02/27/1976, 39 y.o.   MRN: [id number]  HPI  39 year old morbidly obese female  Seen for sleep consult 05/26/2015  TEST  05/2015 >HST w/ AHI 15 /hr  10/20/2015 Follow up : OSA   patient returns for a follow-up for sleep apnea.  She was seen in June of this year and found to have moderate sleep apnea.  SLEEP study in June showed an AHI of 15 events an hour.  Patient was recently started on C Pap at bedtime.  Download over the last 2 weeks shows  Limited usage.  Patient is averaging around 2-3 hours each night.  Leakss are okay.  patient says she forgets to put it on.  She also has a very hectic schedule.  We discussed C Pap compliance.  He says when she does wear she does feel better.  Her blood pressure was elevated today. Discussed medication compliance and follow-up with her primary care physician.    Review of Systems Constitutional:   No  weight loss, night sweats,  Fevers, chills, fatigue, or  lassitude.  HEENT:   No headaches,  Difficulty swallowing,  Tooth/dental problems, or  Sore throat,                No sneezing, itching, ear ache, nasal congestion, post nasal drip,   CV:  No chest pain,  Orthopnea, PND, swelling in lower extremities, anasarca, dizziness, palpitations, syncope.   GI  No heartburn, indigestion, abdominal pain, nausea, vomiting, diarrhea, change in bowel habits, loss of appetite, bloody stools.   Resp: No shortness of breath with exertion or at rest.  No excess mucus, no productive cough,  No non-productive cough,  No coughing up of blood.  No change in color of mucus.  No wheezing.  No chest wall deformity  Skin: no rash or lesions.  GU: no dysuria, change in color of urine, no urgency or frequency.  No flank pain, no hematuria   MS:  No joint pain or swelling.  No decreased range of motion.  No back pain.  Psych:  No change in mood or affect. No depression or anxiety.  No memory  loss.         Objective:   Physical Exam GEN: A/Ox3; pleasant , NAD, morbidly obese   HEENT:  Verdi/AT,  EACs-clear, TMs-wnl, NOSE-clear, THROAT-clear, no lesions, no postnasal drip or exudate noted. Class 2 MP airway   NECK:  Supple w/ fair ROM; no JVD; normal carotid impulses w/o bruits; no thyromegaly or nodules palpated; no lymphadenopathy.  RESP  Clear  P & A; w/o, wheezes/ rales/ or rhonchi.no accessory muscle use, no dullness to percussion  CARD:  RRR, no m/r/g  , no peripheral edema, pulses intact, no cyanosis or clubbing.  GI:   Soft & nt; nml bowel sounds; no organomegaly or masses detected.  Musco: Warm bil, no deformities or joint swelling noted.   Neuro: alert, no focal deficits noted.    Skin: Warm, no lesions or rashes         Assessment & Plan:

## 2015-10-20 NOTE — Assessment & Plan Note (Signed)
Moderate OSA  Plan  Wear CPAP At bedtime   Goal is to wear for at least 6 hours. Do not drive for sleepy Work on weight loss. Follow with Dr. Vassie LollAlva in 3 months and as needed

## 2015-10-24 NOTE — Progress Notes (Signed)
Reviewed & agree with plan  

## 2015-11-09 ENCOUNTER — Encounter: Payer: Self-pay | Admitting: Adult Health

## 2015-11-30 ENCOUNTER — Emergency Department (INDEPENDENT_AMBULATORY_CARE_PROVIDER_SITE_OTHER)
Admission: EM | Admit: 2015-11-30 | Discharge: 2015-11-30 | Disposition: A | Discharge status: Home / Self Care | Payer: 59 | Source: Home / Self Care | Attending: Family Medicine | Admitting: Family Medicine

## 2015-11-30 ENCOUNTER — Encounter (HOSPITAL_COMMUNITY): Payer: Self-pay | Admitting: *Deleted

## 2015-11-30 DIAGNOSIS — J069 Acute upper respiratory infection, unspecified: Secondary | ICD-10-CM | POA: Diagnosis not present

## 2015-11-30 MED ORDER — HYDROCOD POLST-CPM POLST ER 10-8 MG/5ML PO SUER: 5.0000 mL | Freq: Two times a day (BID) | ORAL | Status: DC | PRN

## 2015-11-30 MED ORDER — AZITHROMYCIN 250 MG PO TABS: ORAL_TABLET | ORAL | Status: DC

## 2015-11-30 NOTE — Discharge Instructions (Signed)
Drink plenty of fluids as discussed, use medicine as prescribed, and mucinex or delsym for cough. Return or see your doctor if further problems °

## 2015-11-30 NOTE — ED Notes (Signed)
Pt  Reports  Symptoms  Of   A  Non  ptroductive   Cough  With pain in her  Chest  On movement   And  When  She  Coughs      Symptoms  X  3  Weeks

## 2015-11-30 NOTE — ED Provider Notes (Signed)
CSN: [id number]     Arrival date & time 11/30/15  1705 History   First MD Initiated Contact with Patient 11/30/15 1908     Chief Complaint  Patient presents with  . Cough   (Consider location/radiation/quality/duration/timing/severity/associated sxs/prior Treatment) Patient is a 39 y.o. female presenting with cough. The history is provided by the patient.  Cough Cough characteristics:  Non-productive and dry Severity:  Mild Onset quality:  Gradual Duration:  3 weeks Progression:  Unchanged Chronicity:  New Smoker: no   Context: upper respiratory infection   Relieved by:  None tried Worsened by:  Nothing tried Ineffective treatments:  None tried Associated symptoms: chest pain and rhinorrhea   Associated symptoms: no fever, no shortness of breath, no sore throat and no wheezing     Past Medical History  Diagnosis Date  . Obesity   . GERD (gastroesophageal reflux disease)     occasional; no current med.  [name]. HTN (hypertension)     under control with med., has been on med. x 5 yr.  . History of brain tumor 1996    no neurologic deficits  . Asthma     prn inhaler  . Carpal tunnel syndrome of left wrist 01/2013  . Polycystic ovarian syndrome     no current med.; irregular periods   Past Surgical History  Procedure Laterality Date  . Brain surgery  1996    exc. tumor  . Hysteroscopy w/d&c  02/21/2006  . Tonsillectomy  09/21/2002  . Hysteroscopy w/d&c  12/17/2011    Procedure: DILATATION AND CURETTAGE /HYSTEROSCOPY;  Surgeon: Meriel Picaichard M Holland, MD;  Location: [facility];  Service: Gynecology;  Laterality: N/A;  . Leep  01/07/2004  . Carpal tunnel release Left 02/10/2013    Procedure: CARPAL TUNNEL RELEASE;  Surgeon: Nicki ReaperGary R Kuzma, MD;  Location: [facility];  Service: Orthopedics;  Laterality: Left;  ANESTHESIA: IV REGIONAL FAB   Family History  Problem Relation Age of Onset  . Hypertension Other   . Lung cancer Father     Smoker  . Cancer Father     lung/ smoker   . Breast cancer Maternal Grandmother   . Cancer Maternal Grandmother     breast  . Emphysema Paternal Grandmother   . Asthma Mother   . Asthma Maternal Aunt    Social History  Substance Use Topics  . Smoking status: Never Smoker   . Smokeless tobacco: Never Used  . Alcohol Use: Yes     Comment: rarely   OB History    No data available     Review of Systems  Constitutional: Negative.  Negative for fever.  HENT: Positive for postnasal drip and rhinorrhea. Negative for sore throat.   Respiratory: Positive for cough. Negative for chest tightness, shortness of breath and wheezing.   Cardiovascular: Positive for chest pain. Negative for palpitations and leg swelling.  Gastrointestinal: Negative.   All other systems reviewed and are negative.   Allergies  Review of patient's allergies indicates no known allergies.  Home Medications   Prior to Admission medications   Medication Sig Start Date End Date Taking? Authorizing Provider  albuterol (PROAIR HFA) 108 (90 BASE) MCG/ACT inhaler Inhale 2 puffs into the lungs every 6 (six) hours as needed for wheezing. 03/31/15 12/13/16  Tresa GarterAleksei Plotnikov V, MD  azithromycin (ZITHROMAX Z-PAK) 250 MG tablet Take as directed on pack 11/30/15   Linna HoffJames D Christia Coaxum, MD  chlorpheniramine-HYDROcodone [facility](TUSSIONEX PENNKINETIC ER) 10-8 MG/5ML SUER Take 5 mLs by mouth every 12 (  twelve) hours as needed for cough. 11/30/15   Linna Hoff, MD  famotidine (PEPCID) 20 MG tablet One at bedtime 05/24/14   Nyoka Cowden, MD  hydrochlorothiazide (MICROZIDE) 12.5 MG capsule Take 1 capsule (12.5 mg total) by mouth daily. 11/16/13   Aleksei Plotnikov V, MD  ibuprofen (ADVIL,MOTRIN) 200 MG tablet Take 1,200 mg by mouth daily as needed for moderate pain (tooth pain).    Historical Provider, MD  metFORMIN (GLUCOPHAGE) 500 MG tablet Take 1 tablet (500 mg total) by mouth daily with breakfast. 04/04/15   Aleksei Plotnikov V, MD  Nebivolol HCl (BYSTOLIC) 20 MG TABS Take 1 tablet (20  mg total) by mouth daily. 03/31/15   Aleksei Plotnikov V, MD  pantoprazole (PROTONIX) 40 MG tablet Take 1 tablet (40 mg total) by mouth daily. Take 30-60 min before first meal of the day 05/24/14   Nyoka Cowden, MD  potassium chloride SA (K-DUR,KLOR-CON) 20 MEQ tablet Take 1 tablet (20 mEq total) by mouth daily. 04/04/15   Tresa Garter, MD  traMADol Janean Sark) 50 MG tablet 1-2 every 4 hours as needed for cough or pain 05/24/14   Nyoka Cowden, MD   Meds Ordered and Administered this Visit  Medications - No data to display  BP 188/110 mmHg  Pulse 88  Temp(Src) 97.6 F (36.4 C) (Oral)  Resp 18  SpO2 98%  LMP 11/27/2015 No data found.   Physical Exam  Constitutional: She is oriented to person, place, and time. She appears well-developed and well-nourished. No distress.  HENT:  Right Ear: External ear normal.  Left Ear: External ear normal.  Mouth/Throat: Oropharynx is clear and moist.  Eyes: Pupils are equal, round, and reactive to light.  Neck: Normal range of motion. Neck supple.  Cardiovascular: Normal rate, regular rhythm, normal heart sounds and intact distal pulses.   Pulmonary/Chest: Effort normal and breath sounds normal. She exhibits tenderness.  Abdominal: Soft. Bowel sounds are normal.  Lymphadenopathy:    She has no cervical adenopathy.  Neurological: She is alert and oriented to person, place, and time.  Skin: Skin is warm and dry.  Nursing note and vitals reviewed.   ED Course  Procedures (including critical care time)  Labs Review Labs Reviewed - No data to display  Imaging Review No results found. X-rays reviewed and report per radiologist.   Visual Acuity Review  Right Eye Distance:   Left Eye Distance:   Bilateral Distance:    Right Eye Near:   Left Eye Near:    Bilateral Near:         MDM   1. URI (upper respiratory infection)        Linna Hoff, MD 11/30/15 [phone]

## 2015-12-26 ENCOUNTER — Encounter: Payer: 59 | Admitting: Internal Medicine

## 2016-01-19 ENCOUNTER — Ambulatory Visit: Payer: 59 | Admitting: Pulmonary Disease

## 2016-02-10 ENCOUNTER — Other Ambulatory Visit: Payer: Self-pay | Admitting: Internal Medicine

## 2016-03-21 ENCOUNTER — Encounter: Payer: Self-pay | Admitting: Nurse Practitioner

## 2016-03-21 ENCOUNTER — Ambulatory Visit (INDEPENDENT_AMBULATORY_CARE_PROVIDER_SITE_OTHER): Payer: 59 | Admitting: Nurse Practitioner

## 2016-03-21 VITALS — BP 148/90 | HR 88 | Temp 98.5°F | Ht 65.0 in | Wt 385.0 lb

## 2016-03-21 DIAGNOSIS — I10 Essential (primary) hypertension: Secondary | ICD-10-CM

## 2016-03-21 DIAGNOSIS — J069 Acute upper respiratory infection, unspecified: Secondary | ICD-10-CM

## 2016-03-21 MED ORDER — NEBIVOLOL HCL 20 MG PO TABS: 20.0000 mg | ORAL_TABLET | Freq: Every day | ORAL | Status: DC

## 2016-03-21 MED ORDER — AZITHROMYCIN 250 MG PO TABS: ORAL_TABLET | ORAL | Status: DC

## 2016-03-21 MED ORDER — ALBUTEROL SULFATE HFA 108 (90 BASE) MCG/ACT IN AERS: 2.0000 | INHALATION_SPRAY | Freq: Four times a day (QID) | RESPIRATORY_TRACT | Status: DC | PRN

## 2016-03-21 NOTE — Patient Instructions (Addendum)
Allegra/Claritin/Zyrtec- pick one for drainage  Azithromycin as prescribed   Upper Respiratory Infection, Adult Most upper respiratory infections (URIs) are a viral infection of the air passages leading to the lungs. A URI affects the nose, throat, and upper air passages. The most common type of URI is nasopharyngitis and is typically referred to as "the common cold." URIs run their course and usually go away on their own. Most of the time, a URI does not require medical attention, but sometimes a bacterial infection in the upper airways can follow a viral infection. This is called a secondary infection. Sinus and middle ear infections are common types of secondary upper respiratory infections. Bacterial pneumonia can also complicate a URI. A URI can worsen asthma and chronic obstructive pulmonary disease (COPD). Sometimes, these complications can require emergency medical care and may be life threatening.  CAUSES Almost all URIs are caused by viruses. A virus is a type of germ and can spread from one person to another.  RISKS FACTORS You may be at risk for a URI if:   You smoke.   You have chronic heart or lung disease.  You have a weakened defense (immune) system.   You are very young or very old.   You have nasal allergies or asthma.  You work in crowded or poorly ventilated areas.  You work in health care facilities or schools. SIGNS AND SYMPTOMS  Symptoms typically develop 2-3 days after you come in contact with a cold virus. Most viral URIs last 7-10 days. However, viral URIs from the influenza virus (flu virus) can last 14-18 days and are typically more severe. Symptoms may include:   Runny or stuffy (congested) nose.   Sneezing.   Cough.   Sore throat.   Headache.   Fatigue.   Fever.   Loss of appetite.   Pain in your forehead, behind your eyes, and over your cheekbones (sinus pain).  Muscle aches.  DIAGNOSIS  Your health care provider may diagnose  a URI by:  Physical exam.  Tests to check that your symptoms are not due to another condition such as:  Strep throat.  Sinusitis.  Pneumonia.  Asthma. TREATMENT  A URI goes away on its own with time. It cannot be cured with medicines, but medicines may be prescribed or recommended to relieve symptoms. Medicines may help:  Reduce your fever.  Reduce your cough.  Relieve nasal congestion. HOME CARE INSTRUCTIONS   Take medicines only as directed by your health care provider.   Gargle warm saltwater or take cough drops to comfort your throat as directed by your health care provider.  Use a warm mist humidifier or inhale steam from a shower to increase air moisture. This may make it easier to breathe.  Drink enough fluid to keep your urine clear or pale yellow.   Eat soups and other clear broths and maintain good nutrition.   Rest as needed.   Return to work when your temperature has returned to normal or as your health care provider advises. You may need to stay home longer to avoid infecting others. You can also use a face mask and careful hand washing to prevent spread of the virus.  Increase the usage of your inhaler if you have asthma.   Do not use any tobacco products, including cigarettes, chewing tobacco, or electronic cigarettes. If you need help quitting, ask your health care provider. PREVENTION  The best way to protect yourself from getting a cold is to practice good hygiene.  Avoid oral or hand contact with people with cold symptoms.   Wash your hands often if contact occurs.  There is no clear evidence that vitamin C, vitamin E, echinacea, or exercise reduces the chance of developing a cold. However, it is always recommended to get plenty of rest, exercise, and practice good nutrition.  SEEK MEDICAL CARE IF:   You are getting worse rather than better.   Your symptoms are not controlled by medicine.   You have chills.  You have worsening  shortness of breath.  You have brown or red mucus.  You have yellow or brown nasal discharge.  You have pain in your face, especially when you bend forward.  You have a fever.  You have swollen neck glands.  You have pain while swallowing.  You have white areas in the back of your throat. SEEK IMMEDIATE MEDICAL CARE IF:   You have severe or persistent:  Headache.  Ear pain.  Sinus pain.  Chest pain.  You have chronic lung disease and any of the following:  Wheezing.  Prolonged cough.  Coughing up blood.  A change in your usual mucus.  You have a stiff neck.  You have changes in your:  Vision.  Hearing.  Thinking.  Mood. MAKE SURE YOU:   Understand these instructions.  Will watch your condition.  Will get help right away if you are not doing well or get worse.   This information is not intended to replace advice given to you by your health care provider. Make sure you discuss any questions you have with your health care provider.   Document Released: 05/22/2001 Document Revised: 04/12/2015 Document Reviewed: 03/03/2014 Elsevier Interactive Patient Education [employer]

## 2016-03-21 NOTE — Progress Notes (Signed)
Pre visit review using our clinic review tool, if applicable. No additional management support is needed unless otherwise documented below in the visit note. 

## 2016-03-21 NOTE — Progress Notes (Signed)
Patient ID: Kim Barnes, female    DOB: 06/20/1976  Age: 40 y.o. MRN: [id number]  CC: Laryngitis   HPI Kim Barnes presents for CC of hoarseness x 2 weeks.   1) Hoarseness started yesterday  Chest tenderness with cough yesterday  Dry Cough Congestion  Has not taken BP medication today   Denies sick contacts   Treatment to date:  Has not taken anything to date  History Kim Barnes has a past medical history of Obesity; GERD (gastroesophageal reflux disease); HTN (hypertension); History of brain tumor (1996); Asthma; Carpal tunnel syndrome of left wrist (01/2013); and Polycystic ovarian syndrome.   She has past surgical history that includes Brain surgery (1996); Hysteroscopy w/D&C (02/21/2006); Tonsillectomy (09/21/2002); Hysteroscopy w/D&C (12/17/2011); LEEP (01/07/2004); and Carpal tunnel release (Left, 02/10/2013).   Her family history includes Asthma in her maternal aunt and mother; Breast cancer in her maternal grandmother; Cancer in her father and maternal grandmother; Emphysema in her paternal grandmother; Hypertension in her other; Lung cancer in her father.She reports that she has never smoked. She has never used smokeless tobacco. She reports that she drinks alcohol. She reports that she does not use illicit drugs.  Outpatient Prescriptions Prior to Visit  Medication Sig Dispense Refill  . famotidine (PEPCID) 20 MG tablet One at bedtime 30 tablet 2  . ibuprofen (ADVIL,MOTRIN) 200 MG tablet Take 1,200 mg by mouth daily as needed for moderate pain (tooth pain).    . metFORMIN (GLUCOPHAGE) 500 MG tablet Take 1 tablet by mouth  daily with breakfast 90 tablet 0  . pantoprazole (PROTONIX) 40 MG tablet Take 1 tablet (40 mg total) by mouth daily. Take 30-60 min before first meal of the day 30 tablet 2  . potassium chloride SA (K-DUR,KLOR-CON) 20 MEQ tablet Take 1 tablet (20 mEq total) by mouth daily. 30 tablet 0  . traMADol (ULTRAM) 50 MG tablet 1-2 every 4 hours as needed for cough or pain  40 tablet 0  . albuterol (PROAIR HFA) 108 (90 BASE) MCG/ACT inhaler Inhale 2 puffs into the lungs every 6 (six) hours as needed for wheezing. 1 Inhaler 11  . azithromycin (ZITHROMAX Z-PAK) 250 MG tablet Take as directed on pack 6 tablet 0  . chlorpheniramine-HYDROcodone (TUSSIONEX PENNKINETIC ER) 10-8 MG/5ML SUER Take 5 mLs by mouth every 12 (twelve) hours as needed for cough. 140 mL 0  . hydrochlorothiazide (MICROZIDE) 12.5 MG capsule Take 1 capsule (12.5 mg total) by mouth daily. 30 capsule 5  . Nebivolol HCl (BYSTOLIC) 20 MG TABS Take 1 tablet (20 mg total) by mouth daily. 30 tablet 11   No facility-administered medications prior to visit.    ROS Review of Systems  Constitutional: Positive for fatigue. Negative for fever, chills and diaphoresis.  HENT: Positive for congestion, postnasal drip, rhinorrhea, sore throat and voice change. Negative for ear pain, sinus pressure and sneezing.   Respiratory: Positive for cough. Negative for chest tightness, shortness of breath and wheezing.   Cardiovascular: Negative for chest pain, palpitations and leg swelling.  Gastrointestinal: Negative for nausea, vomiting and diarrhea.  Skin: Negative for rash.  Neurological: Negative for dizziness and headaches.    Objective:  BP 148/90 mmHg  Pulse 88  Temp(Src) 98.5 F (36.9 C) (Oral)  Ht 5\' 5"  (1.651 m)  Wt 385 lb (174.635 kg)  BMI 64.07 kg/m2  SpO2 98%  Physical Exam  Constitutional: She is oriented to person, place, and time. She appears well-developed and well-nourished. No distress.  HENT:  Head: Normocephalic and  atraumatic.  Right Ear: External ear normal.  Left Ear: External ear normal.  Mouth/Throat: Oropharynx is clear and moist. No oropharyngeal exudate.  TMs clear bilaterally Boggy Turbinate  Eyes: EOM are normal. Pupils are equal, round, and reactive to light. Right eye exhibits no discharge. Left eye exhibits no discharge. No scleral icterus.  Neck: Normal range of motion.  Neck supple.  Cardiovascular: Normal rate and regular rhythm.   Pulmonary/Chest: Effort normal and breath sounds normal. No respiratory distress. She has no wheezes. She has no rales. She exhibits no tenderness.  Lymphadenopathy:    She has no cervical adenopathy.  Neurological: She is alert and oriented to person, place, and time.  Skin: Skin is warm and dry. No rash noted. She is not diaphoretic.  Psychiatric: She has a normal mood and affect. Her behavior is normal. Judgment and thought content normal.   Assessment & Plan:   Kim Barnes was seen today for laryngitis.  Diagnoses and all orders for this visit:  Acute URI  Essential hypertension  Other orders -     Discontinue: Nebivolol HCl (BYSTOLIC) 20 MG TABS; Take 1 tablet (20 mg total) by mouth daily. -     Discontinue: azithromycin (ZITHROMAX Z-PAK) 250 MG tablet; Take as directed on pack -     albuterol (PROAIR HFA) 108 (90 Base) MCG/ACT inhaler; Inhale 2 puffs into the lungs every 6 (six) hours as needed for wheezing. -     azithromycin (ZITHROMAX Z-PAK) 250 MG tablet; Take as directed on pack -     Nebivolol HCl (BYSTOLIC) 20 MG TABS; Take 1 tablet (20 mg total) by mouth daily.   I have discontinued Kim Barnes's hydrochlorothiazide and chlorpheniramine-HYDROcodone. I am also having her maintain her famotidine, pantoprazole, traMADol, ibuprofen, potassium chloride SA, metFORMIN, albuterol, azithromycin, and Nebivolol HCl.  Meds ordered this encounter  Medications  . DISCONTD: Nebivolol HCl (BYSTOLIC) 20 MG TABS    Sig: Take 1 tablet (20 mg total) by mouth daily.    Dispense:  30 tablet    Refill:  0    Order Specific Question:  Supervising Provider    Answer:  Duncan Dull L [[id number]]  . DISCONTD: azithromycin (ZITHROMAX Z-PAK) 250 MG tablet    Sig: Take as directed on pack    Dispense:  6 tablet    Refill:  0    Order Specific Question:  Supervising Provider    Answer:  Duncan Dull L [[id number]]  . albuterol (PROAIR HFA) 108  (90 Base) MCG/ACT inhaler    Sig: Inhale 2 puffs into the lungs every 6 (six) hours as needed for wheezing.    Dispense:  1 Inhaler    Refill:  11    Order Specific Question:  Supervising Provider    Answer:  Duncan Dull L [[id number]]  . azithromycin (ZITHROMAX Z-PAK) 250 MG tablet    Sig: Take as directed on pack    Dispense:  6 tablet    Refill:  0    Order Specific Question:  Supervising Provider    Answer:  Duncan Dull L [[id number]]  . Nebivolol HCl (BYSTOLIC) 20 MG TABS    Sig: Take 1 tablet (20 mg total) by mouth daily.    Dispense:  30 tablet    Refill:  0    Order Specific Question:  Supervising Provider    Answer:  Sherlene Shams [[id number]]     Follow-up: Return if symptoms worsen or fail to improve.

## 2016-03-25 DIAGNOSIS — J069 Acute upper respiratory infection, unspecified: Secondary | ICD-10-CM | POA: Insufficient documentation

## 2016-03-25 NOTE — Assessment & Plan Note (Addendum)
New Onset Due to length of symptoms with worsening will treat empirically  Zpac was sent to the pharmacy Enc[employer] Albuterol inhaler for cough FU prn worsening/failure to improve.

## 2016-03-25 NOTE — Assessment & Plan Note (Signed)
BP Readings from Last 3 Encounters:  03/21/16 148/90  11/30/15 188/110  10/20/15 150/70   Pt reports she ran out of bystolic- sent refills to pharmacy  Stressed compliance

## 2016-04-19 ENCOUNTER — Ambulatory Visit (INDEPENDENT_AMBULATORY_CARE_PROVIDER_SITE_OTHER): Payer: 59 | Admitting: Family

## 2016-04-19 ENCOUNTER — Encounter: Payer: Self-pay | Admitting: Family

## 2016-04-19 VITALS — BP 150/102 | HR 85 | Temp 98.8°F | Ht 65.0 in | Wt 384.1 lb

## 2016-04-19 DIAGNOSIS — R059 Cough, unspecified: Secondary | ICD-10-CM

## 2016-04-19 DIAGNOSIS — J45901 Unspecified asthma with (acute) exacerbation: Secondary | ICD-10-CM

## 2016-04-19 DIAGNOSIS — R05 Cough: Secondary | ICD-10-CM

## 2016-04-19 MED ORDER — PREDNISONE 10 MG PO TABS: ORAL_TABLET | ORAL | Status: DC

## 2016-04-19 MED ORDER — LORATADINE 10 MG PO TABS: 10.0000 mg | ORAL_TABLET | Freq: Every day | ORAL | Status: DC

## 2016-04-19 NOTE — Progress Notes (Signed)
Subjective:    Patient ID: Kim Barnes, female    DOB: 05-25-1976, 40 y.o.   MRN: [id number]   Kim Barnes is a 40 y.o. female who presents today for an acute visit.    HPI Comments: Patient here for revaluation of nonproductive cough for one month, not improving. Cough worse when goes outside. Patient was seen in our clinic for 4/12 and treated with azithromycin, without resolve. Has been using her inhaler. Endorses wheezing, SOB.    History of asthma, seasonal allergies, GERD.  No reflux symptoms at this time; only takes protonix as needed. Not a smoker.   Past Medical History  Diagnosis Date  . Obesity   . GERD (gastroesophageal reflux disease)     occasional; no current med.  [name] HTN (hypertension)     under control with med., has been on med. x 5 yr.  . History of brain tumor 1996    no neurologic deficits  . Asthma     prn inhaler  . Carpal tunnel syndrome of left wrist 01/2013  . Polycystic ovarian syndrome     no current med.; irregular periods   Allergies: Review of patient's allergies indicates no known allergies. Current Outpatient Prescriptions on File Prior to Visit  Medication Sig Dispense Refill  . albuterol (PROAIR HFA) 108 (90 Base) MCG/ACT inhaler Inhale 2 puffs into the lungs every 6 (six) hours as needed for wheezing. 1 Inhaler 11  . famotidine (PEPCID) 20 MG tablet One at bedtime 30 tablet 2  . ibuprofen (ADVIL,MOTRIN) 200 MG tablet Take 1,200 mg by mouth daily as needed for moderate pain (tooth pain).    . metFORMIN (GLUCOPHAGE) 500 MG tablet Take 1 tablet by mouth  daily with breakfast 90 tablet 0  . Nebivolol HCl (BYSTOLIC) 20 MG TABS Take 1 tablet (20 mg total) by mouth daily. 30 tablet 0  . pantoprazole (PROTONIX) 40 MG tablet Take 1 tablet (40 mg total) by mouth daily. Take 30-60 min before first meal of the day 30 tablet 2  . traMADol (ULTRAM) 50 MG tablet 1-2 every 4 hours as needed for cough or pain 40 tablet 0  . potassium chloride SA  (K-DUR,KLOR-CON) 20 MEQ tablet Take 1 tablet (20 mEq total) by mouth daily. (Patient not taking: Reported on 04/19/2016) 30 tablet 0   No current facility-administered medications on file prior to visit.    Social History  Substance Use Topics  . Smoking status: Never Smoker   . Smokeless tobacco: Never Used  . Alcohol Use: Yes     Comment: rarely    Review of Systems  Constitutional: Negative for fever and chills.  HENT: Negative for congestion, postnasal drip, sinus pressure and sore throat.   Respiratory: Positive for cough, shortness of breath and wheezing.   Cardiovascular: Negative for chest pain and palpitations.  Gastrointestinal: Negative for nausea and vomiting.      Objective:    BP 150/102 mmHg  Pulse 85  Temp(Src) 98.8 F (37.1 C) (Oral)  Ht 5\' 5"  (1.651 m)  Wt 384 lb 2 oz (174.238 kg)  BMI 63.92 kg/m2  SpO2 93%  LMP 04/05/2016   Physical Exam  Constitutional: She appears well-developed and well-nourished.  HENT:  Head: Normocephalic and atraumatic.  Right Ear: Hearing, tympanic membrane, external ear and ear canal normal. No drainage, swelling or tenderness. No foreign bodies. Tympanic membrane is not erythematous and not bulging. No middle ear effusion. No decreased hearing is noted.  Left Ear: Hearing, tympanic  membrane, external ear and ear canal normal. No drainage, swelling or tenderness. No foreign bodies. Tympanic membrane is not erythematous and not bulging.  No middle ear effusion. No decreased hearing is noted.  Nose: Nose normal. No rhinorrhea. Right sinus exhibits no maxillary sinus tenderness and no frontal sinus tenderness. Left sinus exhibits no maxillary sinus tenderness and no frontal sinus tenderness.  Mouth/Throat: Uvula is midline, oropharynx is clear and moist and mucous membranes are normal. No oropharyngeal exudate, posterior oropharyngeal edema, posterior oropharyngeal erythema or tonsillar abscesses.  Eyes: Conjunctivae are normal.    Cardiovascular: Regular rhythm, normal heart sounds and normal pulses.   Pulmonary/Chest: Effort normal and breath sounds normal. She has no wheezes. She has no rhonchi. She has no rales.  Lymphadenopathy:       Head (right side): No submental, no submandibular, no tonsillar, no preauricular, no posterior auricular and no occipital adenopathy present.       Head (left side): No submental, no submandibular, no tonsillar, no preauricular, no posterior auricular and no occipital adenopathy present.    She has no cervical adenopathy.  Neurological: She is alert.  Skin: Skin is warm and dry.  Psychiatric: She has a normal mood and affect. Her speech is normal and behavior is normal. Thought content normal.  Vitals reviewed.      Assessment & Plan:    1. Cough/2. Asthma with acute exacerbation, unspecified asthma severity Suspect seasonal allergies are causing exacerbation of asthma. Patient is afebrile and no signs or symptoms of bacterial infection at this time. No acute respiratory distress. Trial of prednisone taper and Claritin. - predniSONE (DELTASONE) 10 MG tablet; Take 4 tablets ( total 40 mg) by mouth for 2 days; take 3 tablets ( total 30 mg) by mouth for 2 days; take 2 tablets ( total 20 mg) by mouth for 1 day; take 1 tablet ( total 10 mg) by mouth for 1 day.  Dispense: 17 tablet; Refill: 0  - loratadine (CLARITIN) 10 MG tablet; Take 1 tablet (10 mg total) by mouth daily.  Dispense: 30 tablet; Refill: 6   I have discontinued Ms. Gaby's azithromycin. I am also having her start on predniSONE and loratadine. Additionally, I am having her maintain her famotidine, pantoprazole, traMADol, ibuprofen, potassium chloride SA, metFORMIN, albuterol, and Nebivolol HCl.   Meds ordered this encounter  Medications  . predniSONE (DELTASONE) 10 MG tablet    Sig: Take 4 tablets ( total 40 mg) by mouth for 2 days; take 3 tablets ( total 30 mg) by mouth for 2 days; take 2 tablets ( total 20 mg) by  mouth for 1 day; take 1 tablet ( total 10 mg) by mouth for 1 day.    Dispense:  17 tablet    Refill:  0    Order Specific Question:  Supervising Provider    Answer:  Tresa Garter [[id number]]  . loratadine (CLARITIN) 10 MG tablet    Sig: Take 1 tablet (10 mg total) by mouth daily.    Dispense:  30 tablet    Refill:  6    Order Specific Question:  Supervising Provider    Answer:  Tresa Garter [[id number]]     Start medications as prescribed and explained to patient on After Visit Summary ( AVS). Risks, benefits, and alternatives of the medications and treatment plan prescribed today were discussed, and patient expressed understanding.   Education regarding symptom management and diagnosis given to patient.   Follow-up:Plan follow-up as discussed or as needed  if any worsening symptoms or change in condition.   Continue to follow with Sonda PrimesAlex Plotnikov, MD for routine health maintenance.   Daila L Ballard and I agreed with plan.   Rennie PlowmanMargaret Alfard Cochrane, FNP

## 2016-04-19 NOTE — Patient Instructions (Signed)
Continue to use your inhaler.   Trial of prednisone and claritin.   If there is no improvement in your symptoms, or if there is any worsening of symptoms, or if you have any additional concerns, please return for re-evaluation; or, if we are closed, consider going to the Emergency Room for evaluation if symptoms urgent.  Asthma Attack Prevention While you may not be able to control the fact that you have asthma, you can take actions to prevent asthma attacks. The best way to prevent asthma attacks is to maintain good control of your asthma. You can achieve this by:  Taking your medicines as directed.  Avoiding things that can irritate your airways or make your asthma symptoms worse (asthma triggers).  Keeping track of how well your asthma is controlled and of any changes in your symptoms.  Responding quickly to worsening asthma symptoms (asthma attack).  Seeking emergency care when it is needed. WHAT ARE SOME WAYS TO PREVENT AN ASTHMA ATTACK? Have a Plan Work with your health care provider to create a written plan for managing and treating your asthma attacks (asthma action plan). This plan includes:  A list of your asthma triggers and how you can avoid them.  Information on when medicines should be taken and when their dosages should be changed.  The use of a device that measures how well your lungs are working (peak flow meter). Monitor Your Asthma Use your peak flow meter and record your results in a journal every day. A drop in your peak flow numbers on one or more days may indicate the start of an asthma attack. This can happen even before you start to feel symptoms. You can prevent an asthma attack from getting worse by following the steps in your asthma action plan. Avoid Asthma Triggers Work with your asthma health care provider to find out what your asthma triggers are. This can be done by:  Allergy testing.  Keeping a journal that notes when asthma attacks occur and the  factors that may have contributed to them.  Determining if there are other medical conditions that are making your asthma worse. Once you have determined your asthma triggers, take steps to avoid them. This may include avoiding excessive or prolonged exposure to:  Dust. Have someone dust and vacuum your home for you once or twice a week. Using a high-efficiency particulate arrestance (HEPA) vacuum is best.  Smoke. This includes campfire smoke, forest fire smoke, and secondhand smoke from tobacco products.  Pet dander. Avoid contact with animals that you know you are allergic to.  Allergens from trees, grasses or pollens. Avoid spending a lot of time outdoors when pollen counts are high, and on very windy days.  Very cold, dry, or humid air.  Mold.  Foods that contain high amounts of sulfites.  Strong odors.  Outdoor air pollutants, such as Museum/gallery exhibitions officerengine exhaust.  Indoor air pollutants, such as aerosol sprays and fumes from household cleaners.  Household pests, including dust mites and cockroaches, and pest droppings.  Certain medicines, including NSAIDs. Always talk to your health care provider before stopping or starting any new medicines. Medicines Take over-the-counter and prescription medicines only as told by your health care provider. Many asthma attacks can be prevented by carefully following your medicine schedule. Taking your medicines correctly is especially important when you cannot avoid certain asthma triggers. Act Quickly If an asthma attack does happen, acting quickly can decrease how severe it is and how long it lasts. Take these steps:  Pay attention to your symptoms. If you are coughing, wheezing, or having difficulty breathing, do not wait to see if your symptoms go away on their own. Follow your asthma action plan.  If you have followed your asthma action plan and your symptoms are not improving, call your health care provider or seek immediate medical care at the  nearest hospital. It is important to note how often you need to use your fast-acting rescue inhaler. If you are using your rescue inhaler more often, it may mean that your asthma is not under control. Adjusting your asthma treatment plan may help you to prevent future asthma attacks and help you to gain better control of your condition. HOW CAN I PREVENT AN ASTHMA ATTACK WHEN I EXERCISE? Follow advice from your health care provider about whether you should use your fast-acting inhaler before exercising. Many people with asthma experience exercise-induced bronchoconstriction (EIB). This condition often worsens during vigorous exercise in cold, humid, or dry environments. Usually, people with EIB can stay very active by pre-treating with a fast-acting inhaler before exercising.   This information is not intended to replace advice given to you by your health care provider. Make sure you discuss any questions you have with your health care provider.   Document Released: 11/14/2009 Document Revised: 08/17/2015 Document Reviewed: 04/28/2015 Elsevier Interactive Patient Education [employer]

## 2016-04-19 NOTE — Progress Notes (Signed)
Pre visit review using our clinic review tool, if applicable. No additional management support is needed unless otherwise documented below in the visit note. 

## 2016-04-21 IMAGING — CR DG CHEST 2V
2 series · 2 of 2 positions shown · non-contrast
Comparison: 12/30/2013

CLINICAL DATA: Mid upper chest pain, right shoulder pain

EXAM:
CHEST  2 VIEW

[w chest pa]
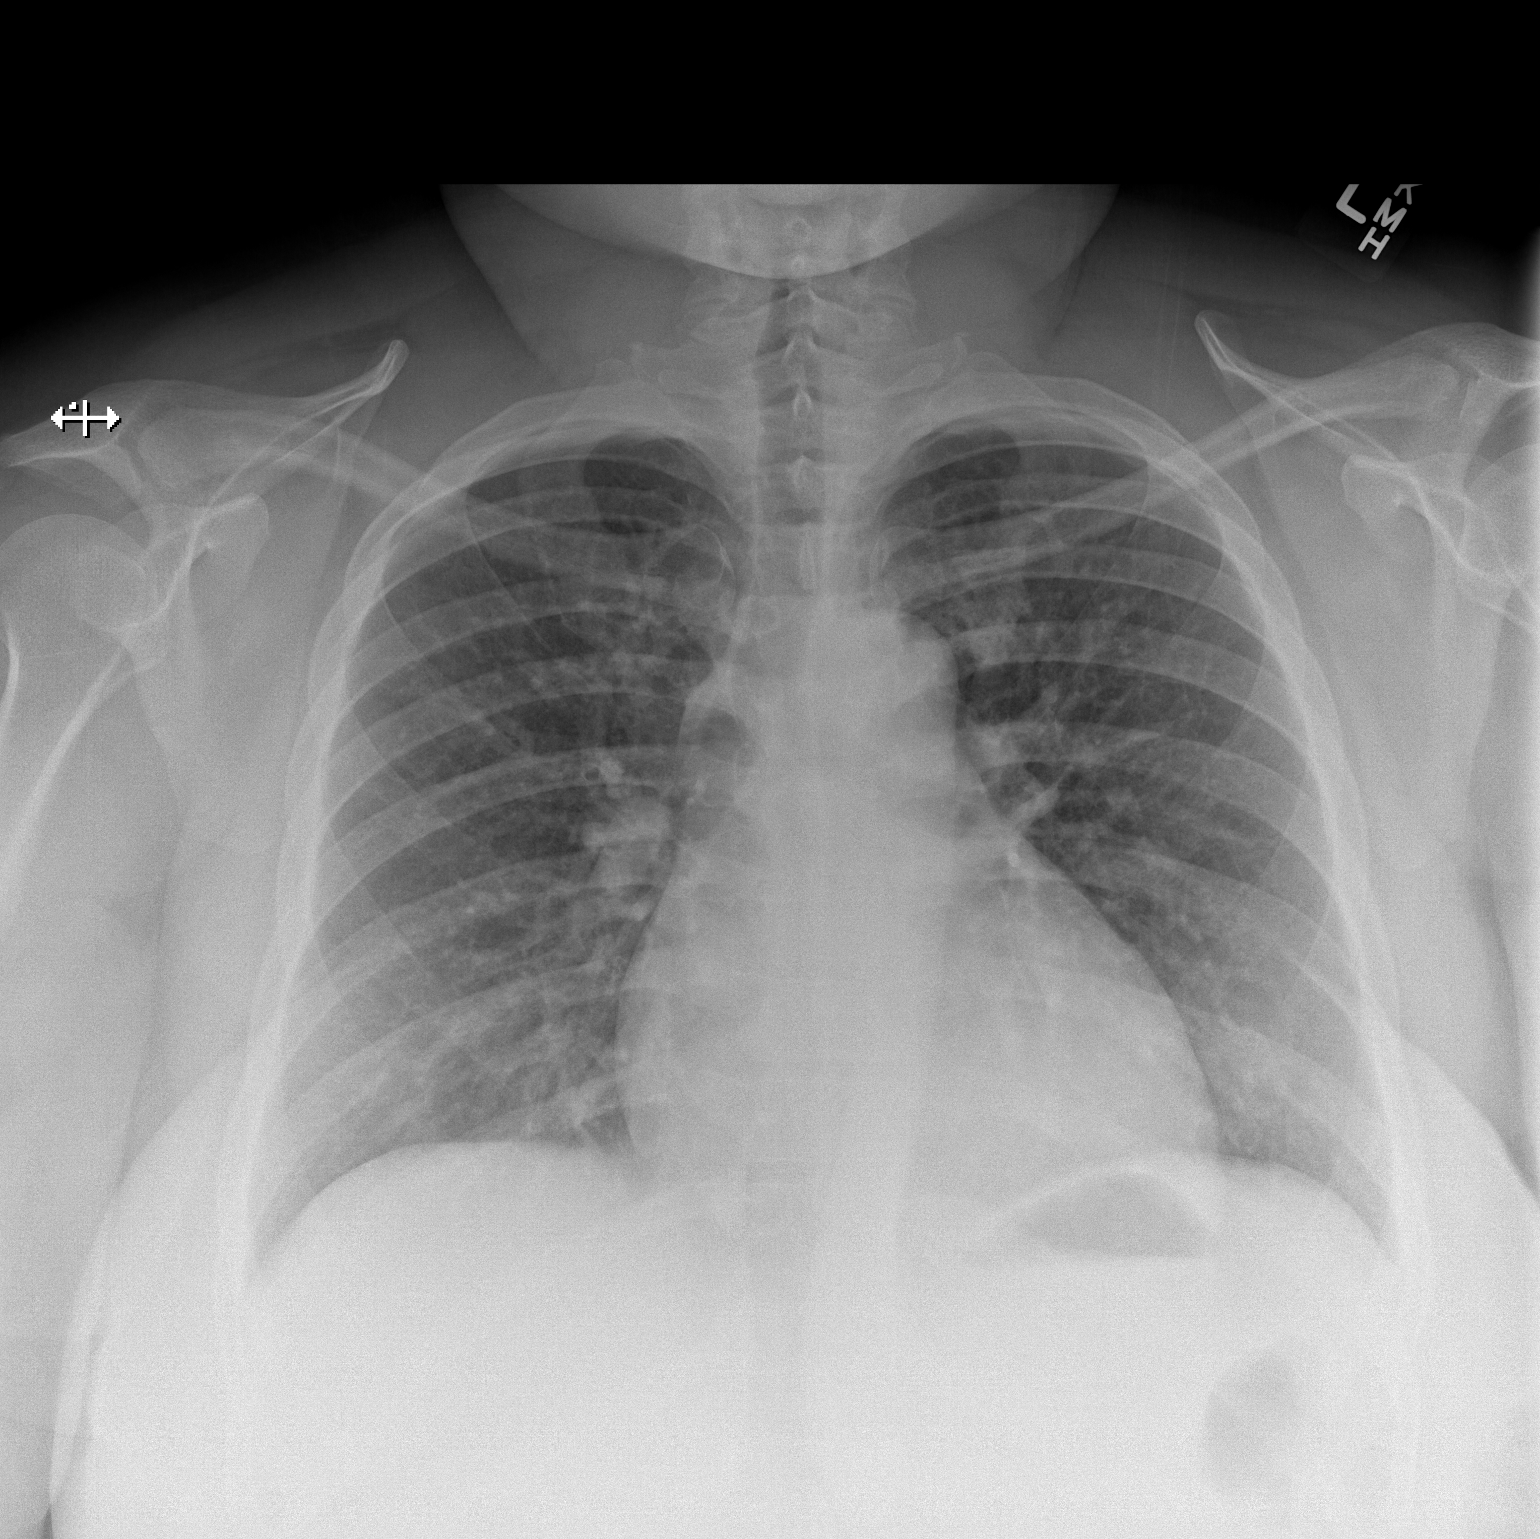

[w chest lat]
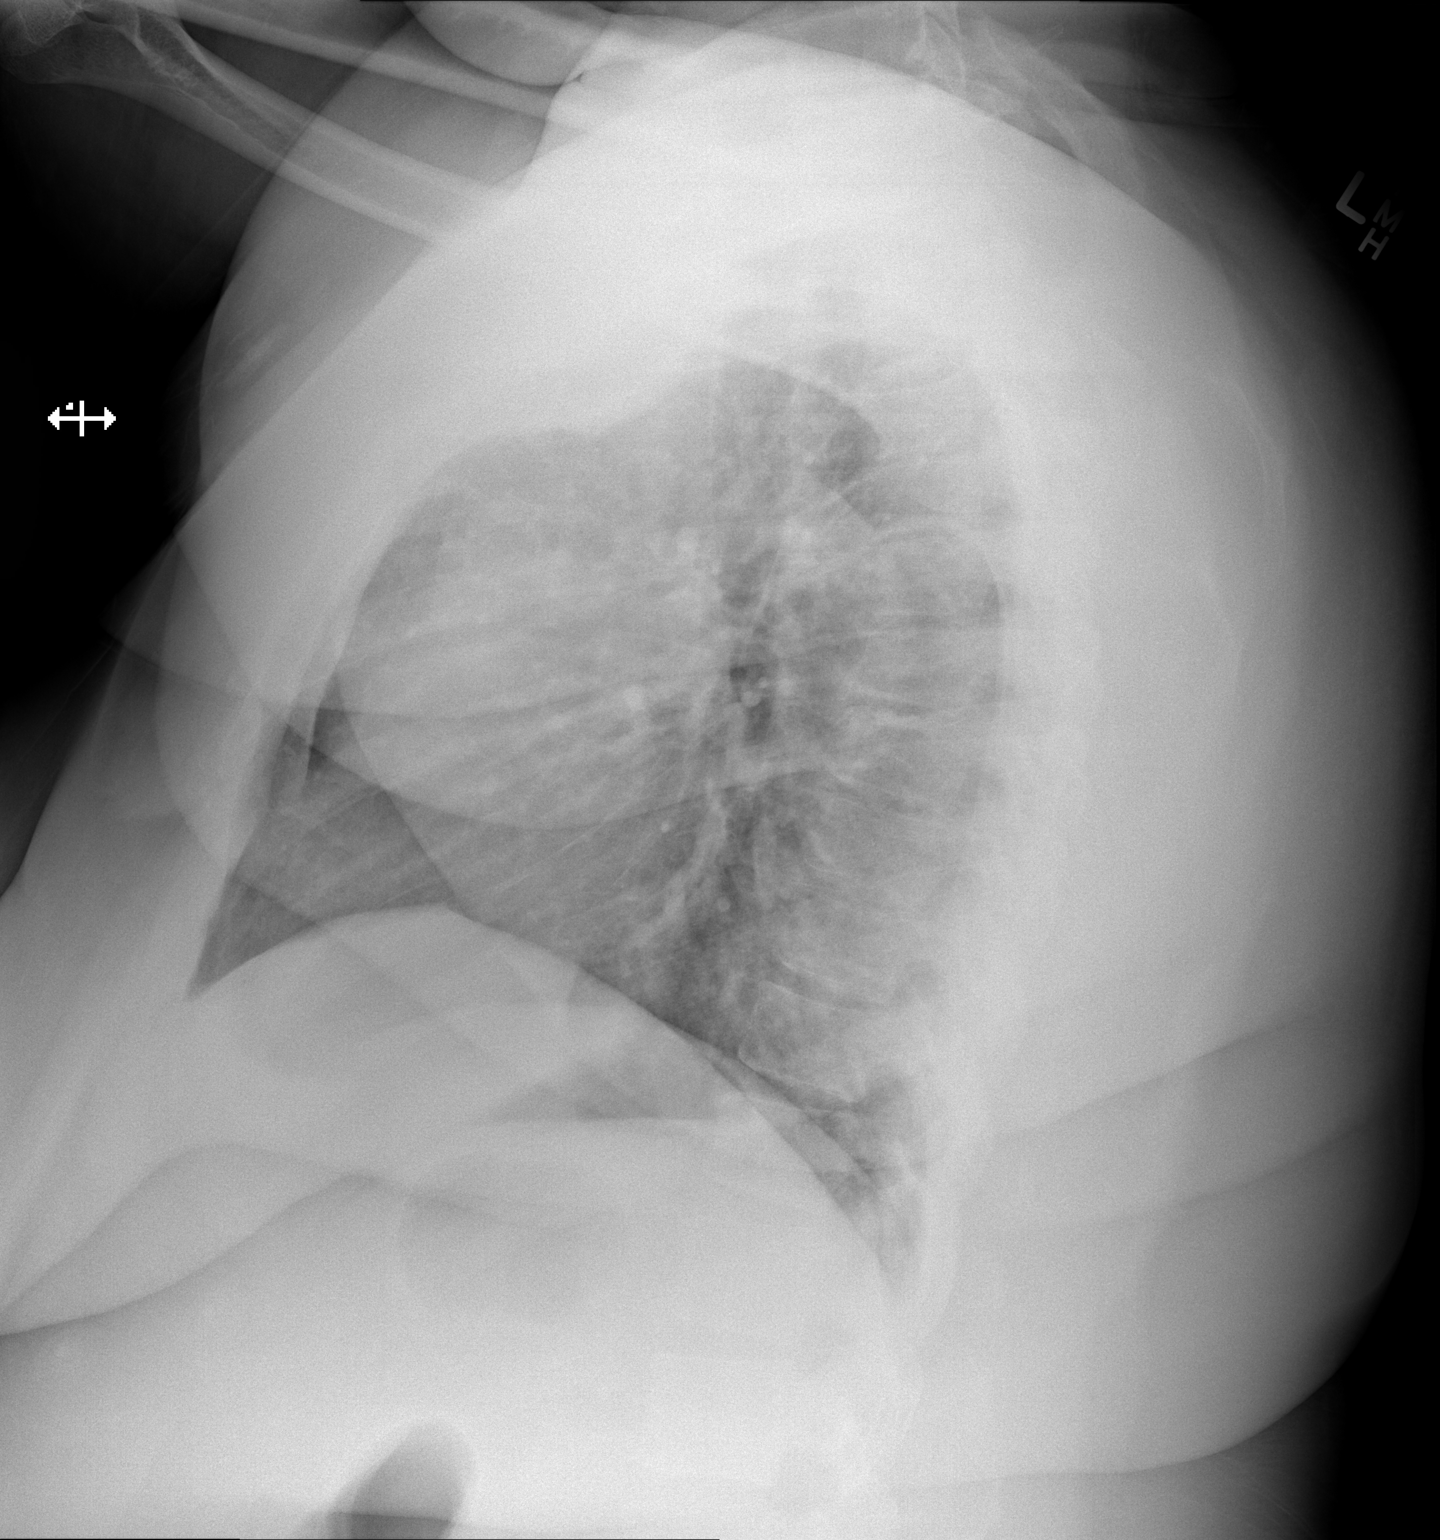

[2 of 2 positions shown; findings below may reference images not displayed]

FINDINGS: There is no focal parenchymal opacity, pleural effusion, or
pneumothorax. The heart size is top normal.

The osseous structures are unremarkable.
IMPRESSION: No active cardiopulmonary disease.

## 2016-05-27 ENCOUNTER — Other Ambulatory Visit: Payer: Self-pay | Admitting: Internal Medicine

## 2016-05-31 ENCOUNTER — Encounter: Payer: Self-pay | Admitting: Family

## 2016-05-31 ENCOUNTER — Ambulatory Visit (INDEPENDENT_AMBULATORY_CARE_PROVIDER_SITE_OTHER): Payer: 59 | Admitting: Family

## 2016-05-31 VITALS — BP 162/100 | Temp 98.8°F | Resp 16 | Wt 386.0 lb

## 2016-05-31 DIAGNOSIS — R059 Cough, unspecified: Secondary | ICD-10-CM

## 2016-05-31 DIAGNOSIS — R05 Cough: Secondary | ICD-10-CM

## 2016-05-31 MED ORDER — METHYLPREDNISOLONE ACETATE 80 MG/ML IJ SUSP
80.0000 mg | Freq: Once | INTRAMUSCULAR | Status: AC
  Administered 2016-05-31: 80 mg via INTRAMUSCULAR

## 2016-05-31 MED ORDER — FLUTICASONE PROPIONATE HFA 110 MCG/ACT IN AERO: 2.0000 | INHALATION_SPRAY | Freq: Two times a day (BID) | RESPIRATORY_TRACT | Status: DC

## 2016-05-31 MED ORDER — METHYLPREDNISOLONE ACETATE 80 MG/ML IJ SUSP: 80.0000 mg | Freq: Once | INTRAMUSCULAR | Status: DC

## 2016-05-31 NOTE — Patient Instructions (Addendum)
CXR tomorrow at your convenience.   I suspect this is asthma. I want you to start an everyday maintenance inhaler and follow-up in our clinic in 1-2 months to see how you're doing. Once we get her asthma better control, we may be able to stop the maintenance steroid.  Please continue to use rescue albuterol inhaler.  If there is no improvement in your symptoms, or if there is any worsening of symptoms, or if you have any additional concerns, please return for re-evaluation; or, if we are closed, consider going to the Emergency Room for evaluation if symptoms urgent.  Asthma Attack Prevention While you may not be able to control the fact that you have asthma, you can take actions to prevent asthma attacks. The best way to prevent asthma attacks is to maintain good control of your asthma. You can achieve this by:  Taking your medicines as directed.  Avoiding things that can irritate your airways or make your asthma symptoms worse (asthma triggers).  Keeping track of how well your asthma is controlled and of any changes in your symptoms.  Responding quickly to worsening asthma symptoms (asthma attack).  Seeking emergency care when it is needed. WHAT ARE SOME WAYS TO PREVENT AN ASTHMA ATTACK? Have a Plan Work with your health care provider to create a written plan for managing and treating your asthma attacks (asthma action plan). This plan includes:  A list of your asthma triggers and how you can avoid them.  Information on when medicines should be taken and when their dosages should be changed.  The use of a device that measures how well your lungs are working (peak flow meter). Monitor Your Asthma Use your peak flow meter and record your results in a journal every day. A drop in your peak flow numbers on one or more days may indicate the start of an asthma attack. This can happen even before you start to feel symptoms. You can prevent an asthma attack from getting worse by following the  steps in your asthma action plan. Avoid Asthma Triggers Work with your asthma health care provider to find out what your asthma triggers are. This can be done by:  Allergy testing.  Keeping a journal that notes when asthma attacks occur and the factors that may have contributed to them.  Determining if there are other medical conditions that are making your asthma worse. Once you have determined your asthma triggers, take steps to avoid them. This may include avoiding excessive or prolonged exposure to:  Dust. Have someone dust and vacuum your home for you once or twice a week. Using a high-efficiency particulate arrestance (HEPA) vacuum is best.  Smoke. This includes campfire smoke, forest fire smoke, and secondhand smoke from tobacco products.  Pet dander. Avoid contact with animals that you know you are allergic to.  Allergens from trees, grasses or pollens. Avoid spending a lot of time outdoors when pollen counts are high, and on very windy days.  Very cold, dry, or humid air.  Mold.  Foods that contain high amounts of sulfites.  Strong odors.  Outdoor air pollutants, such as Museum/gallery exhibitions officerengine exhaust.  Indoor air pollutants, such as aerosol sprays and fumes from household cleaners.  Household pests, including dust mites and cockroaches, and pest droppings.  Certain medicines, including NSAIDs. Always talk to your health care provider before stopping or starting any new medicines. Medicines Take over-the-counter and prescription medicines only as told by your health care provider. Many asthma attacks can be prevented  by carefully following your medicine schedule. Taking your medicines correctly is especially important when you cannot avoid certain asthma triggers. Act Quickly If an asthma attack does happen, acting quickly can decrease how severe it is and how long it lasts. Take these steps:   Pay attention to your symptoms. If you are coughing, wheezing, or having difficulty  breathing, do not wait to see if your symptoms go away on their own. Follow your asthma action plan.  If you have followed your asthma action plan and your symptoms are not improving, call your health care provider or seek immediate medical care at the nearest hospital. It is important to note how often you need to use your fast-acting rescue inhaler. If you are using your rescue inhaler more often, it may mean that your asthma is not under control. Adjusting your asthma treatment plan may help you to prevent future asthma attacks and help you to gain better control of your condition. HOW CAN I PREVENT AN ASTHMA ATTACK WHEN I EXERCISE? Follow advice from your health care provider about whether you should use your fast-acting inhaler before exercising. Many people with asthma experience exercise-induced bronchoconstriction (EIB). This condition often worsens during vigorous exercise in cold, humid, or dry environments. Usually, people with EIB can stay very active by pre-treating with a fast-acting inhaler before exercising.   This information is not intended to replace advice given to you by your health care provider. Make sure you discuss any questions you have with your health care provider.   Document Released: 11/14/2009 Document Revised: 08/17/2015 Document Reviewed: 04/28/2015 Elsevier Interactive Patient Education [employer]2016 [employer]

## 2016-05-31 NOTE — Progress Notes (Signed)
Pre visit review using our clinic review tool, if applicable. No additional management support is needed unless otherwise documented below in the visit note. 

## 2016-05-31 NOTE — Progress Notes (Signed)
Subjective:    Patient ID: Kim Barnes, female    DOB: 01/21/1976, 40 y.o.   MRN: [id number]   Kim LarocheCarisa L Proia is a 40 y.o. female who presents today for an acute visit.    HPI Comments: Patient here for evaluation of non productive cough for one month. Inhaler helps with SOB however cough persists. Cough not worse at night. Endorses SOB, wheezing, seasonal allergies.    Per chart review, I saw patient 4/11 for cough and treated her with prednisone and Claritin with what we thought was seasonal allergies aggravating her cough. Prior to that she was seen 4/12 and treated with azithromycin for URI. Past Medical History  Diagnosis Date  . Obesity   . GERD (gastroesophageal reflux disease)     occasional; no current med.  [name]. HTN (hypertension)     under control with med., has been on med. x 5 yr.  . History of brain tumor 1996    no neurologic deficits  . Asthma     prn inhaler  . Carpal tunnel syndrome of left wrist 01/2013  . Polycystic ovarian syndrome     no current med.; irregular periods   Allergies: Review of patient's allergies indicates no known allergies. Current Outpatient Prescriptions on File Prior to Visit  Medication Sig Dispense Refill  . albuterol (PROAIR HFA) 108 (90 Base) MCG/ACT inhaler Inhale 2 puffs into the lungs every 6 (six) hours as needed for wheezing. 1 Inhaler 11  . famotidine (PEPCID) 20 MG tablet One at bedtime 30 tablet 2  . ibuprofen (ADVIL,MOTRIN) 200 MG tablet Take 1,200 mg by mouth daily as needed for moderate pain (tooth pain).    [name]. loratadine (CLARITIN) 10 MG tablet Take 1 tablet (10 mg total) by mouth daily. 30 tablet 6  . metFORMIN (GLUCOPHAGE) 500 MG tablet Take 1 tablet by mouth  daily with breakfast 30 tablet 0  . Nebivolol HCl (BYSTOLIC) 20 MG TABS Take 1 tablet (20 mg total) by mouth daily. 30 tablet 0  . pantoprazole (PROTONIX) 40 MG tablet Take 1 tablet (40 mg total) by mouth daily. Take 30-60 min before first meal of the day 30 tablet 2    . potassium chloride SA (K-DUR,KLOR-CON) 20 MEQ tablet Take 1 tablet (20 mEq total) by mouth daily. 30 tablet 0  . predniSONE (DELTASONE) 10 MG tablet Take 4 tablets ( total 40 mg) by mouth for 2 days; take 3 tablets ( total 30 mg) by mouth for 2 days; take 2 tablets ( total 20 mg) by mouth for 1 day; take 1 tablet ( total 10 mg) by mouth for 1 day. 17 tablet 0  . traMADol (ULTRAM) 50 MG tablet 1-2 every 4 hours as needed for cough or pain 40 tablet 0   No current facility-administered medications on file prior to visit.    Social History  Substance Use Topics  . Smoking status: Never Smoker   . Smokeless tobacco: Never Used  . Alcohol Use: Yes     Comment: rarely    Review of Systems  Constitutional: Negative for fever and chills.  HENT: Negative for congestion, sinus pressure and sore throat.   Respiratory: Positive for cough, shortness of breath and wheezing.   Cardiovascular: Negative for chest pain and palpitations.  Gastrointestinal: Negative for nausea and vomiting.      Objective:    BP 162/100 mmHg  Temp(Src) 98.8 F (37.1 C) (Oral)  Resp 16  Wt 386 lb (175.088 kg)   Physical  Exam  Constitutional: She appears well-developed and well-nourished.  HENT:  Head: Normocephalic and atraumatic.  Right Ear: Hearing, tympanic membrane, external ear and ear canal normal. No drainage, swelling or tenderness. No foreign bodies. Tympanic membrane is not erythematous and not bulging. No middle ear effusion. No decreased hearing is noted.  Left Ear: Hearing, tympanic membrane, external ear and ear canal normal. No drainage, swelling or tenderness. No foreign bodies. Tympanic membrane is not erythematous and not bulging.  No middle ear effusion. No decreased hearing is noted.  Nose: Nose normal. No rhinorrhea. Right sinus exhibits no maxillary sinus tenderness and no frontal sinus tenderness. Left sinus exhibits no maxillary sinus tenderness and no frontal sinus tenderness.   Mouth/Throat: Uvula is midline, oropharynx is clear and moist and mucous membranes are normal. No oropharyngeal exudate, posterior oropharyngeal edema, posterior oropharyngeal erythema or tonsillar abscesses.  Eyes: Conjunctivae are normal.  Cardiovascular: Regular rhythm, normal heart sounds and normal pulses.   Pulmonary/Chest: Effort normal and breath sounds normal. No accessory muscle usage. No respiratory distress. She has no decreased breath sounds. She has no wheezes. She has no rhonchi. She has no rales.  Lymphadenopathy:       Head (right side): No submental, no submandibular, no tonsillar, no preauricular, no posterior auricular and no occipital adenopathy present.       Head (left side): No submental, no submandibular, no tonsillar, no preauricular, no posterior auricular and no occipital adenopathy present.    She has no cervical adenopathy.  Neurological: She is alert.  Skin: Skin is warm and dry.  Psychiatric: She has a normal mood and affect. Her speech is normal and behavior is normal. Thought content normal.  Vitals reviewed.      Assessment & Plan:  1. Cough Working diagnosis of asthma, moderate persistent. No acute respiratory distress. Unable to get pulse ox due to fake fingernails tonight. Patient has no adventitious lung sounds, fever to suggest bacterial infection. We are pending chest x-ray to ensure no underlying pathology.   We jointly decided to add low-dose inhaled corticosteroid to regimen to see if improves cough, wheezing. Patient will follow-up in our clinic in 1-2 months to see if cough has resolved. If it has not, I will refer her to pulmonology.   - fluticasone (FLOVENT HFA) 110 MCG/ACT inhaler; Inhale 2 puffs into the lungs 2 (two) times daily.  Dispense: 1 Inhaler; Refill: 4 - methylPREDNISolone acetate (DEPO-MEDROL) injection 80 mg; Inject 1 mL (80 mg total) into the muscle once. - DG Chest 2 View     I am having Ms. Egli maintain her famotidine,  pantoprazole, traMADol, ibuprofen, potassium chloride SA, albuterol, Nebivolol HCl, predniSONE, loratadine, and metFORMIN.   No orders of the defined types were placed in this encounter.     Start medications as prescribed and explained to patient on After Visit Summary ( AVS). Risks, benefits, and alternatives of the medications and treatment plan prescribed today were discussed, and patient expressed understanding.   Education regarding symptom management and diagnosis given to patient.   Follow-up:Plan follow-up and return precautions given if any worsening symptoms or change in condition.   Continue to follow with Sonda PrimesAlex Plotnikov, MD for routine health maintenance.   Leslie L Fitzner and I agreed with plan.   Rennie PlowmanMargaret Elizabella Nolet, FNP

## 2016-06-01 ENCOUNTER — Ambulatory Visit (INDEPENDENT_AMBULATORY_CARE_PROVIDER_SITE_OTHER)
Admission: RE | Admit: 2016-06-01 | Discharge: 2016-06-01 | Disposition: A | Discharge status: Home / Self Care | Payer: 59 | Source: Ambulatory Visit | Attending: Family | Admitting: Family

## 2016-06-01 ENCOUNTER — Telehealth: Payer: Self-pay

## 2016-06-01 DIAGNOSIS — R05 Cough: Secondary | ICD-10-CM

## 2016-06-01 MED ORDER — MOMETASONE FUROATE 200 MCG/ACT IN AERO: 2.0000 | INHALATION_SPRAY | Freq: Two times a day (BID) | RESPIRATORY_TRACT | Status: DC

## 2016-06-01 NOTE — Telephone Encounter (Signed)
fluticasone (FLOVENT HFA) 110 MCG/ACT inhaler [[id number]][[id number]]      Patient was here yesterday and saw Claris CheMargaret. Her insurance does not cover this medication. She wants to know if she can get something else. Please follow up. Thank you.

## 2016-06-01 NOTE — Telephone Encounter (Signed)
Sample and Rx are upfront. Pt informed

## 2016-06-01 NOTE — Telephone Encounter (Signed)
Use Asmanex Pls give sample and Rx Thx

## 2016-06-04 ENCOUNTER — Telehealth: Payer: Self-pay | Admitting: Family

## 2016-06-04 DIAGNOSIS — R9389 Abnormal findings on diagnostic imaging of other specified body structures: Secondary | ICD-10-CM

## 2016-06-04 NOTE — Telephone Encounter (Signed)
Pt mother answered.   LVM for pt to call back as soon as possible.

## 2016-06-04 NOTE — Telephone Encounter (Signed)
Please call patient and let her know that CXR showed mild pulmonary congestion which we would like to further evaluate for cough/SOB. This can sometimes present with pulmonary hypertension however it may be an erroneous finding so please tell her not to worry .  We would like to do an echo of the heart to further evaluate.   I have placed that order.

## 2016-06-05 NOTE — Telephone Encounter (Signed)
ECHO order is place.   Do they have the number below? This is the best number to reach the patient.

## 2016-06-05 NOTE — Telephone Encounter (Signed)
Echo scheduled and pt is aware

## 2016-06-05 NOTE — Telephone Encounter (Signed)
[phone] Patient called and to speak with stefannie.. She has to call her back on this number. Thank you.

## 2016-06-06 ENCOUNTER — Other Ambulatory Visit: Payer: Self-pay | Admitting: Internal Medicine

## 2016-06-07 ENCOUNTER — Other Ambulatory Visit: Payer: Self-pay

## 2016-06-07 ENCOUNTER — Ambulatory Visit (HOSPITAL_COMMUNITY): Payer: 59 | Attending: Cardiology

## 2016-06-07 DIAGNOSIS — R938 Abnormal findings on diagnostic imaging of other specified body structures: Secondary | ICD-10-CM | POA: Insufficient documentation

## 2016-06-07 DIAGNOSIS — I340 Nonrheumatic mitral (valve) insufficiency: Secondary | ICD-10-CM | POA: Insufficient documentation

## 2016-06-07 DIAGNOSIS — I313 Pericardial effusion (noninflammatory): Secondary | ICD-10-CM | POA: Diagnosis not present

## 2016-06-07 DIAGNOSIS — I119 Hypertensive heart disease without heart failure: Secondary | ICD-10-CM | POA: Diagnosis not present

## 2016-06-07 DIAGNOSIS — I272 Other secondary pulmonary hypertension: Secondary | ICD-10-CM | POA: Diagnosis present

## 2016-06-07 DIAGNOSIS — R9389 Abnormal findings on diagnostic imaging of other specified body structures: Secondary | ICD-10-CM

## 2016-06-07 LAB — ECHOCARDIOGRAM COMPLETE
E decel time: 183 msec
EERAT: 17.84
FS: 27 % — AB (ref 28–44)
IV/PV OW: 1.37
LA ID, A-P, ES: 40 mm
LA diam end sys: 40 mm
LA diam index: 1.53 cm/m2
LA vol A4C: 85 ml
LAVOL: 87 mL
LAVOLIN: 33.2 mL/m2
LV TDI E'LATERAL: 5.59
LV TDI E'MEDIAL: 8.11
LV e' LATERAL: 5.59 cm/s
LVEEAVG: 17.84
LVEEMED: 17.84
LVOT VTI: 23.5 cm
LVOT area: 3.8 cm2
LVOT diameter: 22 mm
LVOTPV: 99.3 cm/s
LVOTSV: 89 mL
MV Dec: 183
MV pk A vel: 40.5 m/s
MVPG: 4 mmHg
MVPKEVEL: 99.7 m/s
PW: 14.3 mm — AB (ref 0.6–1.1)
Reg peak vel: 185 cm/s
TR max vel: 185 cm/s

## 2016-07-04 ENCOUNTER — Encounter: Payer: Self-pay | Admitting: Internal Medicine

## 2016-07-04 ENCOUNTER — Ambulatory Visit (INDEPENDENT_AMBULATORY_CARE_PROVIDER_SITE_OTHER): Payer: 59 | Admitting: Internal Medicine

## 2016-07-04 VITALS — BP 160/100 | HR 80 | Ht 66.0 in | Wt 386.0 lb

## 2016-07-04 DIAGNOSIS — Z0000 Encounter for general adult medical examination without abnormal findings: Secondary | ICD-10-CM | POA: Diagnosis not present

## 2016-07-04 DIAGNOSIS — Z9989 Dependence on other enabling machines and devices: Secondary | ICD-10-CM

## 2016-07-04 DIAGNOSIS — Z23 Encounter for immunization: Secondary | ICD-10-CM | POA: Diagnosis not present

## 2016-07-04 DIAGNOSIS — G4733 Obstructive sleep apnea (adult) (pediatric): Secondary | ICD-10-CM | POA: Diagnosis not present

## 2016-07-04 DIAGNOSIS — J4520 Mild intermittent asthma, uncomplicated: Secondary | ICD-10-CM | POA: Diagnosis not present

## 2016-07-04 DIAGNOSIS — I10 Essential (primary) hypertension: Secondary | ICD-10-CM

## 2016-07-04 NOTE — Assessment & Plan Note (Signed)
We discussed age appropriate health related issues, including available/recomended screening tests and vaccinations. We discussed a need for adhering to healthy diet and exercise. Labs/EKG were reviewed/ordered. All questions were answered. Risks associated with diet/CPAP/treatment noncompliance were discussed. Compliance was encouraged.

## 2016-07-04 NOTE — Patient Instructions (Signed)
GERD wedge and a body pillow

## 2016-07-04 NOTE — Progress Notes (Signed)
Subjective:  Patient ID: Kim Barnes, female    DOB: [birth year]  Age: 40 y.o. MRN: [id number]  CC: Annual Exam   HPI Allyson L Amescua presents for a well exam. Not taking her meds regular.. c/o cough  Outpatient Medications Prior to Visit  Medication Sig Dispense Refill  . albuterol (PROAIR HFA) 108 (90 Base) MCG/ACT inhaler Inhale 2 puffs into the lungs every 6 (six) hours as needed for wheezing. 1 Inhaler 11  . famotidine (PEPCID) 20 MG tablet One at bedtime 30 tablet 2  . fluticasone (FLOVENT HFA) 110 MCG/ACT inhaler Inhale 2 puffs into the lungs 2 (two) times daily. 1 Inhaler 4  . ibuprofen (ADVIL,MOTRIN) 200 MG tablet Take 1,200 mg by mouth daily as needed for moderate pain (tooth pain).    [name] loratadine (CLARITIN) 10 MG tablet Take 1 tablet (10 mg total) by mouth daily. 30 tablet 6  . metFORMIN (GLUCOPHAGE) 500 MG tablet Take 1 tablet by mouth  daily with breakfast 30 tablet 0  . Mometasone Furoate (ASMANEX HFA) 200 MCG/ACT AERO Inhale 2 Inhalers into the lungs 2 (two) times daily. 1 Inhaler 11  . Nebivolol HCl (BYSTOLIC) 20 MG TABS Take 1 tablet (20 mg total) by mouth daily. 30 tablet 0  . pantoprazole (PROTONIX) 40 MG tablet Take 1 tablet (40 mg total) by mouth daily. Take 30-60 min before first meal of the day 30 tablet 2  . potassium chloride SA (K-DUR,KLOR-CON) 20 MEQ tablet Take 1 tablet (20 mEq total) by mouth daily. 30 tablet 0  . traMADol (ULTRAM) 50 MG tablet 1-2 every 4 hours as needed for cough or pain 40 tablet 0  . predniSONE (DELTASONE) 10 MG tablet Take 4 tablets ( total 40 mg) by mouth for 2 days; take 3 tablets ( total 30 mg) by mouth for 2 days; take 2 tablets ( total 20 mg) by mouth for 1 day; take 1 tablet ( total 10 mg) by mouth for 1 day. 17 tablet 0   No facility-administered medications prior to visit.     ROS Review of Systems  Constitutional: Negative for activity change, appetite change, chills, fatigue and unexpected weight change.  HENT: Positive  for congestion. Negative for mouth sores and sinus pressure.   Eyes: Negative for visual disturbance.  Respiratory: Positive for cough. Negative for chest tightness.   Gastrointestinal: Negative for abdominal distention, abdominal pain and nausea.  Genitourinary: Negative for difficulty urinating, frequency and vaginal pain.  Musculoskeletal: Negative for back pain and gait problem.  Skin: Negative for pallor and rash.  Neurological: Negative for dizziness, tremors, weakness, numbness and headaches.  Psychiatric/Behavioral: Negative for confusion and sleep disturbance.    Objective:  BP (!) 160/100   Pulse 80   Ht  (1.676 m)   Wt (!) 386 lb (175.1 kg)   BMI 62.30 kg/m   BP Readings from Last 3 Encounters:  07/04/16 (!) 160/100  05/31/16 (!) 162/100  04/19/16 (!) 150/102    Wt Readings from Last 3 Encounters:  07/04/16 (!) 386 lb (175.1 kg)  05/31/16 (!) 386 lb (175.1 kg)  04/19/16 (!) 384 lb 2 oz (174.2 kg)    Physical Exam  Constitutional: She appears well-developed. No distress.  HENT:  Head: Normocephalic.  Right Ear: External ear normal.  Left Ear: External ear normal.  Nose: Nose normal.  Mouth/Throat: Oropharynx is clear and moist.  Eyes: Conjunctivae are normal. Pupils are equal, round, and reactive to light. Right eye exhibits no discharge. Left  eye exhibits no discharge.  Neck: Normal range of motion. Neck supple. No JVD present. No tracheal deviation present. No thyromegaly present.  Cardiovascular: Normal rate, regular rhythm and normal heart sounds.   Pulmonary/Chest: No stridor. No respiratory distress. She has no wheezes.  Abdominal: Soft. Bowel sounds are normal. She exhibits no distension and no mass. There is no tenderness. There is no rebound and no guarding.  Musculoskeletal: She exhibits no edema or tenderness.  Lymphadenopathy:    She has no cervical adenopathy.  Neurological: She displays normal reflexes. No cranial nerve deficit. She exhibits  normal muscle tone. Coordination normal.  Skin: No rash noted. No erythema.  Psychiatric: She has a normal mood and affect. Her behavior is normal. Judgment and thought content normal.  Obese  Lab Results  Component Value Date   WBC 6.2 03/31/2015   HGB 11.7 (L) 03/31/2015   HCT 34.8 (L) 03/31/2015   PLT 304.0 03/31/2015   GLUCOSE 85 03/31/2015   CHOL 183 03/31/2015   TRIG 62.0 03/31/2015   HDL 39.50 03/31/2015   LDLDIRECT 149.4 05/24/2010   LDLCALC 131 (H) 03/31/2015   ALT 23 03/31/2015   AST 23 03/31/2015   NA 139 03/31/2015   K 3.2 (L) 03/31/2015   CL 103 03/31/2015   CREATININE 0.79 03/31/2015   BUN 8 03/31/2015   CO2 26 03/31/2015   TSH 1.12 10/01/2013   HGBA1C 5.3 03/31/2015    Dg Chest 2 View  Result Date: 06/01/2016 CLINICAL DATA:  Nonproductive cough and wheezing EXAM: CHEST  2 VIEW COMPARISON:  12/27/2014 FINDINGS: Normal cardiac silhouette. There is mild central venous pulmonary congestion. No focal consolidation or overt pulmonary edema. No pleural fluid. No pneumothorax. IMPRESSION: Central venous pulmonary congestion. Electronically Signed   By: Genevive Bi M.D.   On: 06/01/2016 17:58    Assessment & Plan:   There are no diagnoses linked to this encounter. I have discontinued Ms. Phegley's predniSONE. I am also having her maintain her famotidine, pantoprazole, traMADol, ibuprofen, potassium chloride SA, albuterol, Nebivolol HCl, loratadine, fluticasone, Mometasone Furoate, and metFORMIN.  No orders of the defined types were placed in this encounter.    Follow-up: No Follow-up on file.  Sonda Primes, MD

## 2016-07-04 NOTE — Assessment & Plan Note (Signed)
Bystolic Risks associated with treatment noncompliance were discussed. Compliance was encouraged.

## 2016-07-04 NOTE — Assessment & Plan Note (Signed)
Dr Vassie Loll and Dr Sherene Sires Risks associated with CPAP treatment noncompliance were discussed. Compliance was encouraged.

## 2016-07-04 NOTE — Assessment & Plan Note (Signed)
Chronic GERD wedge and pillow

## 2016-07-04 NOTE — Progress Notes (Signed)
Pre visit review using our clinic review tool, if applicable. No additional management support is needed unless otherwise documented below in the visit note. 

## 2017-01-30 ENCOUNTER — Ambulatory Visit (INDEPENDENT_AMBULATORY_CARE_PROVIDER_SITE_OTHER): Payer: 59 | Admitting: Adult Health

## 2017-01-30 ENCOUNTER — Encounter: Payer: Self-pay | Admitting: Adult Health

## 2017-01-30 ENCOUNTER — Telehealth: Payer: Self-pay

## 2017-01-30 VITALS — BP 148/74 | Temp 98.2°F | Ht 66.0 in | Wt 390.2 lb

## 2017-01-30 DIAGNOSIS — H6690 Otitis media, unspecified, unspecified ear: Secondary | ICD-10-CM

## 2017-01-30 MED ORDER — AMOXICILLIN 500 MG PO CAPS: 500.0000 mg | ORAL_CAPSULE | Freq: Two times a day (BID) | ORAL | 0 refills | Status: DC

## 2017-01-30 NOTE — Progress Notes (Signed)
Subjective:    Patient ID: Kim Barnes, female    DOB: Apr 24, 1976, 41 y.o.   MRN: [id number]  Otalgia   This is a recurrent problem. The current episode started in the past 7 days. The problem has been gradually worsening. There has been no fever. The pain is moderate. Associated symptoms include coughing, rhinorrhea and a sore throat. Pertinent negatives include no ear discharge, headaches or hearing loss. She has tried nothing for the symptoms. Her past medical history is significant for a chronic ear infection.      Review of Systems  Constitutional: Negative for chills, fatigue and fever.  HENT: Positive for ear pain, rhinorrhea and sore throat. Negative for ear discharge, facial swelling, hearing loss, postnasal drip, sinus pain, sinus pressure, tinnitus and trouble swallowing.   Respiratory: Positive for cough.   Cardiovascular: Negative.   Neurological: Negative.  Negative for headaches.   Past Medical History:  Diagnosis Date  . Asthma    prn inhaler  . Carpal tunnel syndrome of left wrist 01/2013  . GERD (gastroesophageal reflux disease)    occasional; no current med.  [name] History of brain tumor 1996   no neurologic deficits  . HTN (hypertension)    under control with med., has been on med. x 5 yr.  . Obesity   . Polycystic ovarian syndrome    no current med.; irregular periods    Social History   Social History  . Marital status: Single    Spouse name: N/A  . Number of children: N/A  . Years of education: N/A   Occupational History  . 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 Smoking status: Never Smoker  . Smokeless tobacco: Never Used  . Alcohol use Yes     Comment: rarely  . Drug use: No  . Sexual activity: Yes    Birth control/ protection: Pill   Other Topics Concern  . Not on file   Social History Narrative  . No narrative on file    Past Surgical History:  Procedure Laterality Date  . BRAIN SURGERY  1996   exc. tumor  . CARPAL  TUNNEL RELEASE Left 02/10/2013   Procedure: CARPAL TUNNEL RELEASE;  Surgeon: Nicki Reaper, MD;  Location: [facility];  Service: Orthopedics;  Laterality: Left;  ANESTHESIA: IV REGIONAL FAB  . HYSTEROSCOPY W/D&C  02/21/2006  . HYSTEROSCOPY W/D&C  12/17/2011   Procedure: DILATATION AND CURETTAGE /HYSTEROSCOPY;  Surgeon: Meriel Pica, MD;  Location: [facility];  Service: Gynecology;  Laterality: N/A;  . LEEP  01/07/2004  . TONSILLECTOMY  09/21/2002    Family History  Problem Relation Age of Onset  . Hypertension Other   . Lung cancer Father     Smoker  . Cancer Father     lung/ smoker  . Breast cancer Maternal Grandmother   . Cancer Maternal Grandmother     breast  . Emphysema Paternal Grandmother   . Asthma Mother   . Asthma Maternal Aunt     No Known Allergies  Current Outpatient Prescriptions on File Prior to Visit  Medication Sig Dispense Refill  . albuterol (PROAIR HFA) 108 (90 Base) MCG/ACT inhaler Inhale 2 puffs into the lungs every 6 (six) hours as needed for wheezing. 1 Inhaler 11  . famotidine (PEPCID) 20 MG tablet One at bedtime 30 tablet 2  . fluticasone (FLOVENT HFA) 110 MCG/ACT inhaler Inhale 2 puffs into the lungs 2 (two) times daily. 1 Inhaler  4  . ibuprofen (ADVIL,MOTRIN) 200 MG tablet Take 1,200 mg by mouth daily as needed for moderate pain (tooth pain).    [name]. loratadine (CLARITIN) 10 MG tablet Take 1 tablet (10 mg total) by mouth daily. 30 tablet 6  . metFORMIN (GLUCOPHAGE) 500 MG tablet Take 1 tablet by mouth  daily with breakfast 30 tablet 0  . Mometasone Furoate (ASMANEX HFA) 200 MCG/ACT AERO Inhale 2 Inhalers into the lungs 2 (two) times daily. 1 Inhaler 11  . Nebivolol HCl (BYSTOLIC) 20 MG TABS Take 1 tablet (20 mg total) by mouth daily. 30 tablet 0  . pantoprazole (PROTONIX) 40 MG tablet Take 1 tablet (40 mg total) by mouth daily. Take 30-60 min before first meal of the day 30 tablet 2  . potassium chloride SA (K-DUR,KLOR-CON) 20 MEQ tablet Take 1  tablet (20 mEq total) by mouth daily. 30 tablet 0  . traMADol (ULTRAM) 50 MG tablet 1-2 every 4 hours as needed for cough or pain 40 tablet 0   No current facility-administered medications on file prior to visit.     BP (!) 148/74   Temp 98.2 F (36.8 C) (Oral)   Ht 5\' 6"  (1.676 m)   Wt (!) 390 lb 3.2 oz (177 kg)   BMI 62.98 kg/m       Objective:   Physical Exam  Constitutional: She is oriented to person, place, and time.  HENT:  Head: Normocephalic and atraumatic.  Right Ear: Hearing, external ear and ear canal normal. Tympanic membrane is erythematous and bulging.  Left Ear: Hearing and external ear normal. Tympanic membrane is erythematous and bulging.  Nose: Nose normal. No mucosal edema or rhinorrhea. Right sinus exhibits no maxillary sinus tenderness. Left sinus exhibits no maxillary sinus tenderness and no frontal sinus tenderness.  Mouth/Throat: Uvula is midline, oropharynx is clear and moist and mucous membranes are normal. No oropharyngeal exudate.  Cardiovascular: Normal rate, regular rhythm, normal heart sounds and intact distal pulses.  Exam reveals no gallop and no friction rub.   No murmur heard. Musculoskeletal: Normal range of motion.  Neurological: She is alert and oriented to person, place, and time.  Skin: Skin is warm and dry. No rash noted. No erythema. No pallor.  Psychiatric: She has a normal mood and affect. Her behavior is normal. Judgment and thought content normal.  Nursing note and vitals reviewed.     Assessment & Plan:  1. Acute otitis media, unspecified otitis media type - amoxicillin (AMOXIL) 500 MG capsule; Take 1 capsule (500 mg total) by mouth 2 (two) times daily.  Dispense: 20 capsule; Refill: 0 - Follow up if no improvement   Shirline Freesory Avani Sensabaugh, NP

## 2017-01-30 NOTE — Telephone Encounter (Signed)
Patient pharmacy requesting Rx for Left Knee and Right Ankle pain.  1) Lidocaine Ointment Usp 5%- Dispense 240 gm  Sig: apply 2 grams to affected area 3-4 x daily. Not exceed 8g  2) Diclofenac Sodium Gel 3%-Dispense 300 gm  Sig: Apply 2-3 g topically, 4x daily  3) Doxepin HCl Cream 5%-Dispense 180 gm  Sig: Apply 1-2g topically, 3x daily to affected area, if drowsy apply at bedtime  4) Fluocinonide Cream USP 0.1%-Dispense 360 gm  Sig: Apply (2-3 gm) to affected area 3-4x  Daily or as directed  -If Fluocinonide Cream 0.1 % not covered please send   Clobetasol Propionate Ointment, USP 0.05%- Dispense 240 gm  Sig: Apply 2-4 g topically, BID  Please advise

## 2017-01-31 NOTE — Telephone Encounter (Signed)
Thanks

## 2017-01-31 NOTE — Telephone Encounter (Signed)
I think it is a scam: denied.  Thx

## 2017-02-04 ENCOUNTER — Emergency Department (HOSPITAL_COMMUNITY): Payer: 59

## 2017-02-04 ENCOUNTER — Encounter (HOSPITAL_COMMUNITY): Payer: Self-pay | Admitting: *Deleted

## 2017-02-04 ENCOUNTER — Emergency Department (HOSPITAL_COMMUNITY)
Admission: EM | Admit: 2017-02-04 | Discharge: 2017-02-04 | Disposition: A | Discharge status: Home / Self Care | Payer: 59 | Attending: Emergency Medicine | Admitting: Emergency Medicine

## 2017-02-04 DIAGNOSIS — G510 Bell's palsy: Secondary | ICD-10-CM | POA: Diagnosis not present

## 2017-02-04 DIAGNOSIS — J45909 Unspecified asthma, uncomplicated: Secondary | ICD-10-CM | POA: Insufficient documentation

## 2017-02-04 DIAGNOSIS — R791 Abnormal coagulation profile: Secondary | ICD-10-CM | POA: Diagnosis not present

## 2017-02-04 DIAGNOSIS — Z85841 Personal history of malignant neoplasm of brain: Secondary | ICD-10-CM | POA: Insufficient documentation

## 2017-02-04 DIAGNOSIS — Z7984 Long term (current) use of oral hypoglycemic drugs: Secondary | ICD-10-CM | POA: Diagnosis not present

## 2017-02-04 DIAGNOSIS — I10 Essential (primary) hypertension: Secondary | ICD-10-CM | POA: Diagnosis not present

## 2017-02-04 DIAGNOSIS — R29810 Facial weakness: Secondary | ICD-10-CM | POA: Diagnosis present

## 2017-02-04 LAB — CBC
HCT: 41.1 % (ref 36.0–46.0)
HEMOGLOBIN: 13.7 g/dL (ref 12.0–15.0)
MCH: 29.3 pg (ref 26.0–34.0)
MCHC: 33.3 g/dL (ref 30.0–36.0)
MCV: 87.8 fL (ref 78.0–100.0)
Platelets: 253 10*3/uL (ref 150–400)
RBC: 4.68 MIL/uL (ref 3.87–5.11)
RDW: 14.9 % (ref 11.5–15.5)
WBC: 6.4 10*3/uL (ref 4.0–10.5)

## 2017-02-04 LAB — COMPREHENSIVE METABOLIC PANEL
ALT: 18 U/L (ref 14–54)
AST: 22 U/L (ref 15–41)
Albumin: 3.9 g/dL (ref 3.5–5.0)
Alkaline Phosphatase: 61 U/L (ref 38–126)
Anion gap: 7 (ref 5–15)
BILIRUBIN TOTAL: 0.6 mg/dL (ref 0.3–1.2)
BUN: 7 mg/dL (ref 6–20)
CHLORIDE: 103 mmol/L (ref 101–111)
CO2: 28 mmol/L (ref 22–32)
Calcium: 9.6 mg/dL (ref 8.9–10.3)
Creatinine, Ser: 0.77 mg/dL (ref 0.44–1.00)
GFR calc non Af Amer: >60 mL/min (ref >60)
Glucose, Bld: 95 mg/dL (ref 65–99)
POTASSIUM: 3.5 mmol/L (ref 3.5–5.1)
Sodium: 138 mmol/L (ref 135–145)
TOTAL PROTEIN: 7.4 g/dL (ref 6.5–8.1)

## 2017-02-04 LAB — I-STAT CHEM 8, ED
BUN: 9 mg/dL (ref 6–20)
Calcium, Ion: 1.18 mmol/L (ref 1.15–1.40)
Chloride: 102 mmol/L (ref 101–111)
Creatinine, Ser: 0.8 mg/dL (ref 0.44–1.00)
GLUCOSE: 93 mg/dL (ref 65–99)
HEMATOCRIT: 43 % (ref 36.0–46.0)
HEMOGLOBIN: 14.6 g/dL (ref 12.0–15.0)
POTASSIUM: 3.7 mmol/L (ref 3.5–5.1)
Sodium: 140 mmol/L (ref 135–145)
TCO2: 31 mmol/L (ref 0–100)

## 2017-02-04 LAB — DIFFERENTIAL
BASOS PCT: 0 %
Basophils Absolute: 0 10*3/uL (ref 0.0–0.1)
EOS PCT: 2 %
Eosinophils Absolute: 0.1 10*3/uL (ref 0.0–0.7)
LYMPHS ABS: 2.3 10*3/uL (ref 0.7–4.0)
Lymphocytes Relative: 36 %
MONO ABS: 0.3 10*3/uL (ref 0.1–1.0)
MONOS PCT: 5 %
Neutro Abs: 3.7 10*3/uL (ref 1.7–7.7)
Neutrophils Relative %: 57 %

## 2017-02-04 LAB — I-STAT TROPONIN, ED: TROPONIN I, POC: 0 ng/mL (ref 0.00–0.08)

## 2017-02-04 LAB — PROTIME-INR
INR: 1.16
Prothrombin Time: 14.9 seconds (ref 11.4–15.2)

## 2017-02-04 LAB — APTT: APTT: 32 s (ref 24–36)

## 2017-02-04 MED ORDER — METHYLPREDNISOLONE 4 MG PO TBPK: ORAL_TABLET | ORAL | 0 refills | Status: AC

## 2017-02-04 MED ORDER — METHYLPREDNISOLONE SODIUM SUCC 125 MG IJ SOLR
80.0000 mg | Freq: Once | INTRAMUSCULAR | Status: AC
  Administered 2017-02-04: 80 mg via INTRAMUSCULAR
  Filled 2017-02-04: qty 2

## 2017-02-04 NOTE — ED Provider Notes (Signed)
[facility]-EMERGENCY DEPT Provider Note   CSN: [id number] Arrival date & time: 02/04/17  1804     History   Chief Complaint Chief Complaint  Patient presents with  . Otalgia    HPI Kim Barnes is a 41 y.o. female.  Patient states that on Thursday evening.  She noticed that she had a facial droop has been persistent.  She has no weakness of extremities.  No dizziness.  She is recently being treated for bilateral otitis with antibiotics.  He states she's not had any difficulty swallowing.  She intermittently states that she has some blurry vision but she can't discern if it is unilateral, bilateral resolve spontaneously and quickly.  He does have a remote history of a brain tumor with surgical excision 19 96 with total resolution, hypertension, GERD, obesity and asthma      Past Medical History:  Diagnosis Date  . Asthma    prn inhaler  . Carpal tunnel syndrome of left wrist 01/2013  . GERD (gastroesophageal reflux disease)    occasional; no current med.  [name] History of brain tumor 1996   no neurologic deficits  . HTN (hypertension)    under control with med., has been on med. x 5 yr.  . Obesity   . Polycystic ovarian syndrome    no current med.; irregular periods    Patient Active Problem List   Diagnosis Date Noted  . Acute URI 03/25/2016  . OSA on CPAP 10/20/2015  . PCOS (polycystic ovarian syndrome) 03/31/2015  . Snoring 03/31/2015  . Upper airway cough syndrome 05/25/2014  . Well adult exam 10/01/2013  . Asthma exacerbation 08/27/2011  . GERD 09/20/2010  . Cough 05/24/2010  . DEPRESSION 12/06/2009  . Edema 05/12/2009  . DYSPNEA 05/12/2009  . Chest pain, unspecified 05/12/2009  . OBESITY, MORBID 07/07/2008  . Asthma 07/07/2008  . AMENORRHEA 07/07/2008  . Essential hypertension 09/08/2007  . Hyperglycemia 09/08/2007    Past Surgical History:  Procedure Laterality Date  . BRAIN SURGERY  1996   exc. tumor  . CARPAL TUNNEL RELEASE Left 02/10/2013   Procedure:  CARPAL TUNNEL RELEASE;  Surgeon: Nicki Reaper, MD;  Location: [facility];  Service: Orthopedics;  Laterality: Left;  ANESTHESIA: IV REGIONAL FAB  . HYSTEROSCOPY W/D&C  02/21/2006  . HYSTEROSCOPY W/D&C  12/17/2011   Procedure: DILATATION AND CURETTAGE /HYSTEROSCOPY;  Surgeon: Meriel Pica, MD;  Location: [facility];  Service: Gynecology;  Laterality: N/A;  . LEEP  01/07/2004  . TONSILLECTOMY  09/21/2002    OB History    No data available       Home Medications    Prior to Admission medications   Medication Sig Start Date End Date Taking? Authorizing Provider  albuterol (PROAIR HFA) 108 (90 Base) MCG/ACT inhaler Inhale 2 puffs into the lungs every 6 (six) hours as needed for wheezing. 03/21/16 12/04/17  Carollee Leitz, NP  amoxicillin (AMOXIL) 500 MG capsule Take 1 capsule (500 mg total) by mouth 2 (two) times daily. 01/30/17 02/09/17  Shirline Frees, NP  famotidine (PEPCID) 20 MG tablet One at bedtime 05/24/14   Nyoka Cowden, MD  fluticasone (FLOVENT HFA) 110 MCG/ACT inhaler Inhale 2 puffs into the lungs 2 (two) times daily. 05/31/16   Allegra Grana, FNP  ibuprofen (ADVIL,MOTRIN) 200 MG tablet Take 1,200 mg by mouth daily as needed for moderate pain (tooth pain).    Historical Provider, MD  loratadine (CLARITIN) 10 MG tablet Take 1 tablet (10 mg total) by  mouth daily. 04/19/16   Allegra GranaMargaret G Arnett, FNP  metFORMIN (GLUCOPHAGE) 500 MG tablet Take 1 tablet by mouth  daily with breakfast 06/06/16   Aleksei Plotnikov V, MD  methylPREDNISolone (MEDROL DOSEPAK) 4 MG TBPK tablet Day 1: 8 mg PO before breakfast, 4 mg after lunch and after dinner, and 8 mg at bedtime  Day 2: 4 mg PO before breakfast, after lunch, and after dinner and 8 mg at bedtime  Day 3: 4 mg PO before breakfast, after lunch, after dinner, and at bedtime  Day 4: 4 mg PO before breakfast, after lunch, and at bedtime  Day 5: 4 mg PO before breakfast and at bedtime  Day 6: 4 mg PO before breakfast  May be tapered  over 12 days (to decrease chanc 02/04/17 02/16/17  Earley FavorGail Gracin Mcpartland, NP  Mometasone Furoate [facility](ASMANEX HFA) 200 MCG/ACT AERO Inhale 2 Inhalers into the lungs 2 (two) times daily. 06/01/16   Aleksei Plotnikov V, MD  Nebivolol HCl (BYSTOLIC) 20 MG TABS Take 1 tablet (20 mg total) by mouth daily. 03/21/16   Carollee Leitzarrie M Doss, NP  pantoprazole (PROTONIX) 40 MG tablet Take 1 tablet (40 mg total) by mouth daily. Take 30-60 min before first meal of the day 05/24/14   Nyoka CowdenMichael B Wert, MD  potassium chloride SA (K-DUR,KLOR-CON) 20 MEQ tablet Take 1 tablet (20 mEq total) by mouth daily. 04/04/15   Aleksei Plotnikov V, MD  traMADol Janean Sark(ULTRAM) 50 MG tablet 1-2 every 4 hours as needed for cough or pain 05/24/14   Nyoka CowdenMichael B Wert, MD    Family History Family History  Problem Relation Age of Onset  . Hypertension Other   . Lung cancer Father     Smoker  . Cancer Father     lung/ smoker  . Breast cancer Maternal Grandmother   . Cancer Maternal Grandmother     breast  . Emphysema Paternal Grandmother   . Asthma Mother   . Asthma Maternal Aunt     Social History Social History  Substance Use Topics  . Smoking status: Never Smoker  . Smokeless tobacco: Never Used  . Alcohol use Yes     Comment: rarely     Allergies   Patient has no known allergies.   Review of Systems Review of Systems  Constitutional: Negative for fever.  HENT: Positive for ear pain. Negative for congestion and ear discharge.   Neurological: Positive for facial asymmetry. Negative for dizziness, weakness, light-headedness, numbness and headaches.  All other systems reviewed and are negative.    Physical Exam Updated Vital Signs BP 164/79   Pulse (!) 58   Temp 97.9 F (36.6 C) (Oral)   Resp 18   Ht 5\' 6"  (1.676 m)   Wt (!) 176.9 kg   SpO2 98%   BMI 62.95 kg/m   Physical Exam  Constitutional: She is oriented to person, place, and time. She appears well-developed and well-nourished. No distress.  HENT:  Head: Normocephalic.    Right Ear: External ear normal.  Left Ear: External ear normal.  Mouth/Throat: Oropharynx is clear and moist.  R facial droop   Eyes: Pupils are equal, round, and reactive to light. Right eye exhibits no nystagmus. Left eye exhibits no nystagmus.  Neck: Normal range of motion.  Cardiovascular: Normal rate.   Pulmonary/Chest: Effort normal.  Abdominal: Soft.  Neurological: She is alert and oriented to person, place, and time. She has normal strength. No cranial nerve deficit or sensory deficit. She displays a negative Romberg sign.  Skin: Skin is warm.  Psychiatric: She has a normal mood and affect.  Nursing note and vitals reviewed.    ED Treatments / Results  Labs (all labs ordered are listed, but only abnormal results are displayed) Labs Reviewed  PROTIME-INR  APTT  CBC  DIFFERENTIAL  COMPREHENSIVE METABOLIC PANEL  I-STAT TROPOININ, ED  I-STAT CHEM 8, ED    EKG  EKG Interpretation  Date/Time:  Monday February 04 2017 18:16:07 EST Ventricular Rate:  73 PR Interval:  172 QRS Duration: 88 QT Interval:  428 QTC Calculation: 471 R Axis:   43 Text Interpretation:  Normal sinus rhythm Cannot rule out Anterior infarct , age undetermined No significant change since last tracing Confirmed by [facility]  MD, WHITNEY ([id number]) on 02/04/2017 8:03:31 PM       Radiology Ct Head Wo Contrast  Result Date: 02/04/2017 CLINICAL DATA:  Left-sided facial palsy since Friday. Previous MRI report provides additional clinical data of a previous left temporal tumor resection. EXAM: CT HEAD WITHOUT CONTRAST TECHNIQUE: Contiguous axial images were obtained from the base of the skull through the vertex without intravenous contrast. COMPARISON:  Head CT dated 10/19/2009 and brain MRI dated 10/14/2000. FINDINGS: Brain: Ventricles remain normal in size and configuration. The small area of chronic encephalomalacia within the left temporal lobe appears stable, compatible with site of previous tumor  resection, with an associated chronic benign appearing calcification. There is no mass, hemorrhage, edema or other evidence of acute parenchymal abnormality. No extra-axial hemorrhage. Vascular: No hyperdense vessel or unexpected calcification. Skull: Postsurgical craniotomy changes of the left frontotemporal bone appear stable. No acute appearing osseous abnormality. Sinuses/Orbits: Chronic mucous retention cysts within the left maxillary sinus. No evidence of acute sinusitis. Periorbital and retro-orbital soft tissues are unremarkable. Other: None. IMPRESSION: 1. No acute findings.  No intracranial mass, hemorrhage or edema. 2. Postsurgical changes within the left temporal region appear stable, as detailed above. Electronically Signed   By: Bary Richard M.D.   On: 02/04/2017 19:58    Procedures Procedures (including critical care time)  Medications Ordered in ED Medications  methylPREDNISolone sodium succinate (SOLU-MEDROL) 125 mg/2 mL injection 80 mg (not administered)     Initial Impression / Assessment and Plan / ED Course  I have reviewed the triage vital signs and the nursing notes.  Pertinent labs & imaging results that were available during my care of the patient were reviewed by me and considered in my medical decision making (see chart for details).      Bells palsey will start Mederol dose pack and have patient FU with PCP  Final Clinical Impressions(s) / ED Diagnoses   Final diagnoses:  Bell's palsy    New Prescriptions New Prescriptions   METHYLPREDNISOLONE (MEDROL DOSEPAK) 4 MG TBPK TABLET    Day 1: 8 mg PO before breakfast, 4 mg after lunch and after dinner, and 8 mg at bedtime  Day 2: 4 mg PO before breakfast, after lunch, and after dinner and 8 mg at bedtime  Day 3: 4 mg PO before breakfast, after lunch, after dinner, and at bedtime  Day 4: 4 mg PO before breakfast, after lunch, and at bedtime  Day 5: 4 mg PO before breakfast and at bedtime  Day 6: 4 mg PO  before breakfast  May be tapered over 12 days (to decrease chanc     Earley Favor, NP 02/04/17 2059    Earley Favor, NP 02/04/17 2111    Gwyneth Sprout, MD 02/05/17 1642

## 2017-02-04 NOTE — Discharge Instructions (Signed)
You have been prescribed a Medrol Dosepak.  Please take it as instructed until all tablets have been completed.  Please call your primary care physician for follow-up.  You've also been given referral to ENT or ear, nose and throat specialist for evaluation of your chronic recurrent ear infections

## 2017-02-04 NOTE — ED Notes (Signed)
Pt returned from CT °

## 2017-02-04 NOTE — ED Triage Notes (Signed)
The pt cannot close her rt eye  Equal grips  Moves all 4 extremities without any problem  Rt sided facial droop since friday

## 2017-02-04 NOTE — ED Notes (Signed)
Pt stable, ambulatory, states understanding of discharge instructions 

## 2017-02-04 NOTE — ED Triage Notes (Signed)
No answer

## 2017-02-04 NOTE — ED Triage Notes (Signed)
Diagnosed with a ear infection Thursday.  Headache with blurred vision and  While eating  Everything  On the lt side of her face was not working properly  She cannot close her rt eye completely  And she has a rt facial droop.  She has had this since Friday  clesr speech

## 2017-02-05 ENCOUNTER — Ambulatory Visit (INDEPENDENT_AMBULATORY_CARE_PROVIDER_SITE_OTHER): Payer: 59 | Admitting: Internal Medicine

## 2017-02-05 ENCOUNTER — Encounter: Payer: Self-pay | Admitting: Internal Medicine

## 2017-02-05 VITALS — BP 160/100 | HR 77 | Temp 98.5°F | Resp 16 | Ht 66.0 in | Wt 390.2 lb

## 2017-02-05 DIAGNOSIS — J4520 Mild intermittent asthma, uncomplicated: Secondary | ICD-10-CM

## 2017-02-05 DIAGNOSIS — H663X3 Other chronic suppurative otitis media, bilateral: Secondary | ICD-10-CM | POA: Diagnosis not present

## 2017-02-05 DIAGNOSIS — G510 Bell's palsy: Secondary | ICD-10-CM | POA: Insufficient documentation

## 2017-02-05 MED ORDER — B COMPLEX PO TABS: 1.0000 | ORAL_TABLET | Freq: Every day | ORAL | 3 refills | Status: DC

## 2017-02-05 MED ORDER — VALACYCLOVIR HCL 1 G PO TABS: 1000.0000 mg | ORAL_TABLET | Freq: Three times a day (TID) | ORAL | 0 refills | Status: DC

## 2017-02-05 NOTE — Assessment & Plan Note (Signed)
Doing OK 

## 2017-02-05 NOTE — Patient Instructions (Signed)
Bell Palsy Bell palsy is a condition in which the muscles on one side of the face become paralyzed. This often causes one side of the face to droop. It is a common condition and most people recover completely. RISK FACTORS Risk factors for Bell palsy include:  Pregnancy.  Diabetes.  An infection by a virus, such as infections that cause cold sores. CAUSES  Bell palsy is caused by damage to or inflammation of a nerve in your face. It is unclear why this happens, but an infection by a virus may lead to it. Most of the time the reason it happens is unknown. SIGNS AND SYMPTOMS  Symptoms can range from mild to severe and can take place over a number of hours. Symptoms may include:  Being unable to:  Raise one or both eyebrows.  Close one or both eyes.  Feel parts of your face (facial numbness).  Drooping of the eyelid and corner of the mouth.  Weakness in the face.  Paralysis of half your face.  Loss of taste.  Sensitivity to loud noises.  Difficulty chewing.  Tearing up of the affected eye.  Dryness in the affected eye.  Drooling.  Pain behind one ear. DIAGNOSIS  Diagnosis of Bell palsy may include:  A medical history and physical exam.  An MRI.  A CT scan.  Electromyography (EMG). This is a test that checks how your nerves are working. TREATMENT  Treatment may include antiviral medicine to help shorten the length of the condition. Sometimes treatment is not needed and the symptoms go away on their own. HOME CARE INSTRUCTIONS   Take medicines only as directed by your health care provider.  Do facial massages and exercises as directed by your health care provider.  If your eye is affected:  Use moisturizing eye drops to prevent drying of your eye as directed by your health care provider.  Protect your eye as directed by your health care provider. SEEK MEDICAL CARE IF:  Your symptoms do not get better or get worse.  You are drooling.  Your eye is red,  irritated, or hurts. SEEK IMMEDIATE MEDICAL CARE IF:   Another part of your body feels weak or numb.  You have difficulty swallowing.  You have a fever along with symptoms of Bell palsy.  You develop neck pain. MAKE SURE YOU:   Understand these instructions.  Will watch your condition.  Will get help right away if you are not doing well or get worse. This information is not intended to replace advice given to you by your health care provider. Make sure you discuss any questions you have with your health care provider. Document Released: 11/26/2005 Document Revised: 08/17/2015 Document Reviewed: 03/05/2014 Elsevier Interactive Patient Education  2017 Elsevier Inc.  

## 2017-02-05 NOTE — Progress Notes (Signed)
Subjective:  Patient ID: Kim Barnes, female    DOB: [birth year]  Age: 41 y.o. MRN: [id number]  CC: Numbness (facial, left side, difficult, eating drinking or brushing, blurr vision with and without glasses)   HPI Kim Barnes presents for L Bell's palsy on the L - started on Sat. Head CT was ok.  Outpatient Medications Prior to Visit  Medication Sig Dispense Refill  . albuterol (PROAIR HFA) 108 (90 Base) MCG/ACT inhaler Inhale 2 puffs into the lungs every 6 (six) hours as needed for wheezing. 1 Inhaler 11  . amoxicillin (AMOXIL) 500 MG capsule Take 1 capsule (500 mg total) by mouth 2 (two) times daily. 20 capsule 0  . famotidine (PEPCID) 20 MG tablet One at bedtime 30 tablet 2  . fluticasone (FLOVENT HFA) 110 MCG/ACT inhaler Inhale 2 puffs into the lungs 2 (two) times daily. 1 Inhaler 4  . ibuprofen (ADVIL,MOTRIN) 200 MG tablet Take 1,200 mg by mouth daily as needed for moderate pain (tooth pain).    [name] loratadine (CLARITIN) 10 MG tablet Take 1 tablet (10 mg total) by mouth daily. 30 tablet 6  . metFORMIN (GLUCOPHAGE) 500 MG tablet Take 1 tablet by mouth  daily with breakfast 30 tablet 0  . methylPREDNISolone (MEDROL DOSEPAK) 4 MG TBPK tablet Day 1: 8 mg PO before breakfast, 4 mg after lunch and after dinner, and 8 mg at bedtime  Day 2: 4 mg PO before breakfast, after lunch, and after dinner and 8 mg at bedtime  Day 3: 4 mg PO before breakfast, after lunch, after dinner, and at bedtime  Day 4: 4 mg PO before breakfast, after lunch, and at bedtime  Day 5: 4 mg PO before breakfast and at bedtime  Day 6: 4 mg PO before breakfast  May be tapered over 12 days (to decrease chanc 21 tablet 0  . Mometasone Furoate (ASMANEX HFA) 200 MCG/ACT AERO Inhale 2 Inhalers into the lungs 2 (two) times daily. 1 Inhaler 11  . Nebivolol HCl (BYSTOLIC) 20 MG TABS Take 1 tablet (20 mg total) by mouth daily. 30 tablet 0  . pantoprazole (PROTONIX) 40 MG tablet Take 1 tablet (40 mg total) by mouth daily.  Take 30-60 min before first meal of the day 30 tablet 2  . potassium chloride SA (K-DUR,KLOR-CON) 20 MEQ tablet Take 1 tablet (20 mEq total) by mouth daily. 30 tablet 0  . traMADol (ULTRAM) 50 MG tablet 1-2 every 4 hours as needed for cough or pain 40 tablet 0   No facility-administered medications prior to visit.     ROS Review of Systems  Constitutional: Negative for activity change, appetite change, chills, fatigue and unexpected weight change.  HENT: Positive for drooling and ear pain. Negative for congestion, mouth sores and sinus pressure.   Eyes: Positive for discharge. Negative for pain and visual disturbance.  Respiratory: Negative for cough and chest tightness.   Gastrointestinal: Negative for abdominal pain and nausea.  Genitourinary: Negative for difficulty urinating, frequency and vaginal pain.  Musculoskeletal: Negative for back pain and gait problem.  Skin: Negative for pallor and rash.  Neurological: Positive for weakness and numbness. Negative for dizziness, tremors and headaches.       L facial droop  Psychiatric/Behavioral: Negative for confusion and sleep disturbance.    Objective:  BP (!) 160/100   Pulse 77   Temp 98.5 F (36.9 C) (Oral)   Resp 16   Ht 5\' 6"  (1.676 m)   Wt (!) 390 lb 4  oz (177 kg)   LMP  (LMP Unknown)   SpO2 96%   BMI 62.99 kg/m   BP Readings from Last 3 Encounters:  02/05/17 (!) 160/100  02/04/17 164/79  01/30/17 (!) 148/74    Wt Readings from Last 3 Encounters:  02/05/17 (!) 390 lb 4 oz (177 kg)  02/04/17 (!) 390 lb (176.9 kg)  01/30/17 (!) 390 lb 3.2 oz (177 kg)    Physical Exam  Lab Results  Component Value Date   WBC 6.4 02/04/2017   HGB 14.6 02/04/2017   HCT 43.0 02/04/2017   PLT 253 02/04/2017   GLUCOSE 93 02/04/2017   CHOL 183 03/31/2015   TRIG 62.0 03/31/2015   HDL 39.50 03/31/2015   LDLDIRECT 149.4 05/24/2010   LDLCALC 131 (H) 03/31/2015   ALT 18 02/04/2017   AST 22 02/04/2017   NA 140 02/04/2017   K 3.7  02/04/2017   CL 102 02/04/2017   CREATININE 0.80 02/04/2017   BUN 9 02/04/2017   CO2 28 02/04/2017   TSH 1.12 10/01/2013   INR 1.16 02/04/2017   HGBA1C 5.3 03/31/2015    Ct Head Wo Contrast  Result Date: 02/04/2017 CLINICAL DATA:  Left-sided facial palsy since Friday. Previous MRI report provides additional clinical data of a previous left temporal tumor resection. EXAM: CT HEAD WITHOUT CONTRAST TECHNIQUE: Contiguous axial images were obtained from the base of the skull through the vertex without intravenous contrast. COMPARISON:  Head CT dated 10/19/2009 and brain MRI dated 10/14/2000. FINDINGS: Brain: Ventricles remain normal in size and configuration. The small area of chronic encephalomalacia within the left temporal lobe appears stable, compatible with site of previous tumor resection, with an associated chronic benign appearing calcification. There is no mass, hemorrhage, edema or other evidence of acute parenchymal abnormality. No extra-axial hemorrhage. Vascular: No hyperdense vessel or unexpected calcification. Skull: Postsurgical craniotomy changes of the left frontotemporal bone appear stable. No acute appearing osseous abnormality. Sinuses/Orbits: Chronic mucous retention cysts within the left maxillary sinus. No evidence of acute sinusitis. Periorbital and retro-orbital soft tissues are unremarkable. Other: None. IMPRESSION: 1. No acute findings.  No intracranial mass, hemorrhage or edema. 2. Postsurgical changes within the left temporal region appear stable, as detailed above. Electronically Signed   By: Bary RichardStan  Maynard M.D.   On: 02/04/2017 19:58    Assessment & Plan:   There are no diagnoses linked to this encounter. I am having Ms. Knight maintain her famotidine, pantoprazole, traMADol, ibuprofen, potassium chloride SA, albuterol, Nebivolol HCl, loratadine, fluticasone, Mometasone Furoate, metFORMIN, amoxicillin, and methylPREDNISolone.  No orders of the defined types were placed in  this encounter.    Follow-up: No Follow-up on file.  Sonda PrimesAlex Plotnikov, MD

## 2017-02-05 NOTE — Assessment & Plan Note (Signed)
Finish abx ENT ref

## 2017-02-05 NOTE — Progress Notes (Signed)
Pre-visit discussion using our clinic review tool. No additional management support is needed unless otherwise documented below in the visit note.  

## 2017-02-05 NOTE — Assessment & Plan Note (Signed)
Added Valtrex, B complex Notes and tests reviewed

## 2017-02-27 DIAGNOSIS — H6993 Unspecified Eustachian tube disorder, bilateral: Secondary | ICD-10-CM | POA: Insufficient documentation

## 2017-02-27 DIAGNOSIS — H6503 Acute serous otitis media, bilateral: Secondary | ICD-10-CM | POA: Insufficient documentation

## 2017-03-01 ENCOUNTER — Ambulatory Visit (INDEPENDENT_AMBULATORY_CARE_PROVIDER_SITE_OTHER): Payer: 59 | Admitting: Internal Medicine

## 2017-03-01 ENCOUNTER — Encounter: Payer: Self-pay | Admitting: Internal Medicine

## 2017-03-01 DIAGNOSIS — J4520 Mild intermittent asthma, uncomplicated: Secondary | ICD-10-CM

## 2017-03-01 DIAGNOSIS — I10 Essential (primary) hypertension: Secondary | ICD-10-CM | POA: Diagnosis not present

## 2017-03-01 DIAGNOSIS — R05 Cough: Secondary | ICD-10-CM | POA: Diagnosis not present

## 2017-03-01 DIAGNOSIS — J4521 Mild intermittent asthma with (acute) exacerbation: Secondary | ICD-10-CM

## 2017-03-01 DIAGNOSIS — R058 Other specified cough: Secondary | ICD-10-CM

## 2017-03-01 MED ORDER — TRIAMTERENE-HCTZ 37.5-25 MG PO TABS: 1.0000 | ORAL_TABLET | Freq: Every day | ORAL | 11 refills | Status: DC

## 2017-03-01 MED ORDER — MONTELUKAST SODIUM 10 MG PO TABS: 10.0000 mg | ORAL_TABLET | Freq: Every day | ORAL | 3 refills | Status: DC

## 2017-03-01 NOTE — Assessment & Plan Note (Addendum)
On inhalers Asmanex, Proair Added Singulair Consider switching to Symbicort 

## 2017-03-01 NOTE — Assessment & Plan Note (Signed)
On inhalers Asmanex, Proair Added Singulair Consider switching to Symbicort

## 2017-03-01 NOTE — Assessment & Plan Note (Signed)
Wt Readings from Last 3 Encounters:  03/01/17 (!) 384 lb 12 oz (174.5 kg)  02/05/17 (!) 390 lb 4 oz (177 kg)  02/04/17 (!) 390 lb (176.9 kg)

## 2017-03-01 NOTE — Assessment & Plan Note (Signed)
Try Singulair

## 2017-03-01 NOTE — Progress Notes (Signed)
Subjective:  Patient ID: Kim Barnes, female    DOB: 04/08/1976  Age: 41 y.o. MRN: [id number]  CC: Facial problem (numbness ) and Cough (Seen ENT, Fluid behind bilateral ears, no infection )   HPI Kim Barnes presents for cough x 3 wks. C/o sinusitis w/yellow drainage. S/p ENT  Visit on Wed - no need for abx; on Flonase   Outpatient Medications Prior to Visit  Medication Sig Dispense Refill  . albuterol (PROAIR HFA) 108 (90 Base) MCG/ACT inhaler Inhale 2 puffs into the lungs every 6 (six) hours as needed for wheezing. 1 Inhaler 11  . b complex vitamins tablet Take 1 tablet by mouth daily. 100 tablet 3  . famotidine (PEPCID) 20 MG tablet One at bedtime 30 tablet 2  . fluticasone (FLOVENT HFA) 110 MCG/ACT inhaler Inhale 2 puffs into the lungs 2 (two) times daily. 1 Inhaler 4  . ibuprofen (ADVIL,MOTRIN) 200 MG tablet Take 1,200 mg by mouth daily as needed for moderate pain (tooth pain).    [name]. loratadine (CLARITIN) 10 MG tablet Take 1 tablet (10 mg total) by mouth daily. 30 tablet 6  . metFORMIN (GLUCOPHAGE) 500 MG tablet Take 1 tablet by mouth  daily with breakfast 30 tablet 0  . Mometasone Furoate (ASMANEX HFA) 200 MCG/ACT AERO Inhale 2 Inhalers into the lungs 2 (two) times daily. 1 Inhaler 11  . Nebivolol HCl (BYSTOLIC) 20 MG TABS Take 1 tablet (20 mg total) by mouth daily. 30 tablet 0  . pantoprazole (PROTONIX) 40 MG tablet Take 1 tablet (40 mg total) by mouth daily. Take 30-60 min before first meal of the day 30 tablet 2  . potassium chloride SA (K-DUR,KLOR-CON) 20 MEQ tablet Take 1 tablet (20 mEq total) by mouth daily. 30 tablet 0  . valACYclovir (VALTREX) 1000 MG tablet Take 1 tablet (1,000 mg total) by mouth 3 (three) times daily. 21 tablet 0   No facility-administered medications prior to visit.     ROS Review of Systems  Constitutional: Negative for activity change, appetite change, chills, fatigue and unexpected weight change.  HENT: Positive for congestion and sinus  pressure. Negative for mouth sores.   Eyes: Negative for visual disturbance.  Respiratory: Positive for cough and wheezing. Negative for chest tightness.   Gastrointestinal: Negative for abdominal pain and nausea.  Genitourinary: Negative for difficulty urinating, frequency and vaginal pain.  Musculoskeletal: Negative for back pain and gait problem.  Skin: Negative for pallor and rash.  Neurological: Negative for dizziness, tremors, weakness, numbness and headaches.  Psychiatric/Behavioral: Negative for confusion, sleep disturbance and suicidal ideas.    Objective:  BP (!) 160/116   Pulse 66   Temp 98.4 F (36.9 C) (Oral)   Resp 16   Ht 5\' 6"  (1.676 m)   Wt (!) 384 lb 12 oz (174.5 kg)   LMP  (LMP Unknown)   SpO2 96%   BMI 62.10 kg/m   BP Readings from Last 3 Encounters:  03/01/17 (!) 160/116  02/05/17 (!) 160/100  02/04/17 164/79    Wt Readings from Last 3 Encounters:  03/01/17 (!) 384 lb 12 oz (174.5 kg)  02/05/17 (!) 390 lb 4 oz (177 kg)  02/04/17 (!) 390 lb (176.9 kg)    Physical Exam  Constitutional: She appears well-developed. No distress.  HENT:  Head: Normocephalic.  Right Ear: External ear normal.  Left Ear: External ear normal.  Nose: Nose normal.  Mouth/Throat: Oropharynx is clear and moist.  Eyes: Conjunctivae are normal. Pupils are equal,  round, and reactive to light. Right eye exhibits no discharge. Left eye exhibits no discharge.  Neck: Normal range of motion. Neck supple. No JVD present. No tracheal deviation present. No thyromegaly present.  Cardiovascular: Normal rate, regular rhythm and normal heart sounds.   Pulmonary/Chest: No stridor. No respiratory distress. She has no wheezes.  Abdominal: Soft. Bowel sounds are normal. She exhibits no distension and no mass. There is no tenderness. There is no rebound and no guarding.  Musculoskeletal: She exhibits no edema or tenderness.  Lymphadenopathy:    She has no cervical adenopathy.  Neurological: She  displays normal reflexes. No cranial nerve deficit. She exhibits normal muscle tone. Coordination normal.  Skin: No rash noted. No erythema.  Psychiatric: She has a normal mood and affect. Her behavior is normal. Judgment and thought content normal.  Obese  Lab Results  Component Value Date   WBC 6.4 02/04/2017   HGB 14.6 02/04/2017   HCT 43.0 02/04/2017   PLT 253 02/04/2017   GLUCOSE 93 02/04/2017   CHOL 183 03/31/2015   TRIG 62.0 03/31/2015   HDL 39.50 03/31/2015   LDLDIRECT 149.4 05/24/2010   LDLCALC 131 (H) 03/31/2015   ALT 18 02/04/2017   AST 22 02/04/2017   NA 140 02/04/2017   K 3.7 02/04/2017   CL 102 02/04/2017   CREATININE 0.80 02/04/2017   BUN 9 02/04/2017   CO2 28 02/04/2017   TSH 1.12 10/01/2013   INR 1.16 02/04/2017   HGBA1C 5.3 03/31/2015    Ct Head Wo Contrast  Result Date: 02/04/2017 CLINICAL DATA:  Left-sided facial palsy since Friday. Previous MRI report provides additional clinical data of a previous left temporal tumor resection. EXAM: CT HEAD WITHOUT CONTRAST TECHNIQUE: Contiguous axial images were obtained from the base of the skull through the vertex without intravenous contrast. COMPARISON:  Head CT dated 10/19/2009 and brain MRI dated 10/14/2000. FINDINGS: Brain: Ventricles remain normal in size and configuration. The small area of chronic encephalomalacia within the left temporal lobe appears stable, compatible with site of previous tumor resection, with an associated chronic benign appearing calcification. There is no mass, hemorrhage, edema or other evidence of acute parenchymal abnormality. No extra-axial hemorrhage. Vascular: No hyperdense vessel or unexpected calcification. Skull: Postsurgical craniotomy changes of the left frontotemporal bone appear stable. No acute appearing osseous abnormality. Sinuses/Orbits: Chronic mucous retention cysts within the left maxillary sinus. No evidence of acute sinusitis. Periorbital and retro-orbital soft tissues are  unremarkable. Other: None. IMPRESSION: 1. No acute findings.  No intracranial mass, hemorrhage or edema. 2. Postsurgical changes within the left temporal region appear stable, as detailed above. Electronically Signed   By: Bary Richard M.D.   On: 02/04/2017 19:58    Assessment & Plan:   There are no diagnoses linked to this encounter. I am having Ms. Copland maintain her famotidine, pantoprazole, ibuprofen, potassium chloride SA, albuterol, Nebivolol HCl, loratadine, fluticasone, Mometasone Furoate, metFORMIN, valACYclovir, b complex vitamins, and fluticasone.  Meds ordered this encounter  Medications  . fluticasone (FLONASE) 50 MCG/ACT nasal spray    Sig: Place into the nose.     Follow-up: No Follow-up on file.  Sonda Primes, MD

## 2017-03-01 NOTE — Assessment & Plan Note (Signed)
Cont Bystolic We added Triamt-HCT

## 2017-03-01 NOTE — Progress Notes (Signed)
Pre-visit discussion using our clinic review tool. No additional management support is needed unless otherwise documented below in the visit note.  

## 2017-11-05 ENCOUNTER — Telehealth: Payer: Self-pay | Admitting: Internal Medicine

## 2017-11-05 NOTE — Telephone Encounter (Signed)
Patient Name: Kim RasmussenCARISA Barnes  DOB: 10/17/1976    Initial Comment Caller is having left flank pain. Sometime its causes her to double over.   Nurse Assessment  Nurse: Helane RimaKareken, RN, Yehuda MaoJeanine Date/Time (Eastern Time): 11/05/2017 8:33:31 AM  Confirm and document reason for call. If symptomatic, describe symptoms. ---Caller is having left flank pain. Sometimes it causes her to double over. States that when she is sitting or laying down, she is fine, but as soon as she moves it is very painful. States she has had this pain going on and off for weeks. This episode started last Tuesday. Pain is located on the left side of the abdomen more so than her side, lower area. Denies diarrhea, nausea, and vomiting. Denies any injury that she is aware of.  Does the patient have any new or worsening symptoms? ---Yes  Will a triage be completed? ---Yes  Related visit to physician within the last 2 weeks? ---No  Does the PT have any chronic conditions? (i.e. diabetes, asthma, etc.) ---No  Is the patient pregnant or possibly pregnant? (Ask all females between the ages of 212-55) ---No  Is this a behavioral health or substance abuse call? ---No     Guidelines    Guideline Title Affirmed Question Affirmed Notes  Abdominal Pain - Female [1] MILD pain (e.g., does not interfere with normal activities) AND [2] pain comes and goes (cramps) AND [3] present > 48 hours    Final Disposition User   See Physician within 24 Hours Helane RimaKareken, RN, GoreJeanine    Comments  Earliest appt available tomorrow morning at 9:45 am with Dr. Posey ReaPlotnikov. Nurse attempted to schedule in Epic but says "provider already has the maximum number of patients." Called patient back to see if she would be willing to see another provider in the practice. States that is fine. First available appt is with Dr. Okey Duprerawford tomorrow at 9:45 am. Patient states she will take that appt. Scheduled in Epic by nurse Ethel RanaHeather S.   Referrals  REFERRED TO PCP OFFICE   Caller  Disagree/Comply Comply  Caller Understands Yes  PreDisposition Call Doctor

## 2017-11-06 ENCOUNTER — Ambulatory Visit (INDEPENDENT_AMBULATORY_CARE_PROVIDER_SITE_OTHER): Payer: 59 | Admitting: Internal Medicine

## 2017-11-06 ENCOUNTER — Other Ambulatory Visit (INDEPENDENT_AMBULATORY_CARE_PROVIDER_SITE_OTHER): Payer: 59

## 2017-11-06 ENCOUNTER — Encounter: Payer: Self-pay | Admitting: Internal Medicine

## 2017-11-06 DIAGNOSIS — R1032 Left lower quadrant pain: Secondary | ICD-10-CM

## 2017-11-06 LAB — COMPREHENSIVE METABOLIC PANEL
ALK PHOS: 65 U/L (ref 39–117)
ALT: 17 U/L (ref 0–35)
AST: 18 U/L (ref 0–37)
Albumin: 4.1 g/dL (ref 3.5–5.2)
BILIRUBIN TOTAL: 0.6 mg/dL (ref 0.2–1.2)
BUN: 8 mg/dL (ref 6–23)
CO2: 33 mEq/L — ABNORMAL HIGH (ref 19–32)
Calcium: 9.4 mg/dL (ref 8.4–10.5)
Chloride: 101 mEq/L (ref 96–112)
Creatinine, Ser: 0.78 mg/dL (ref 0.40–1.20)
GFR: 105.52 mL/min (ref >60.00)
GLUCOSE: 95 mg/dL (ref 70–99)
Potassium: 3.5 mEq/L (ref 3.5–5.1)
Sodium: 140 mEq/L (ref 135–145)
TOTAL PROTEIN: 7.9 g/dL (ref 6.0–8.3)

## 2017-11-06 LAB — CBC
HCT: 42.9 % (ref 36.0–46.0)
HEMOGLOBIN: 14.4 g/dL (ref 12.0–15.0)
MCHC: 33.6 g/dL (ref 30.0–36.0)
MCV: 87.7 fl (ref 78.0–100.0)
Platelets: 286 10*3/uL (ref 150.0–400.0)
RBC: 4.89 Mil/uL (ref 3.87–5.11)
RDW: 14.8 % (ref 11.5–15.5)
WBC: 6.2 10*3/uL (ref 4.0–10.5)

## 2017-11-06 LAB — POCT URINE PREGNANCY: PREG TEST UR: NEGATIVE

## 2017-11-06 MED ORDER — TRAMADOL HCL 50 MG PO TABS: 50.0000 mg | ORAL_TABLET | Freq: Three times a day (TID) | ORAL | 0 refills | Status: DC | PRN

## 2017-11-06 NOTE — Progress Notes (Signed)
   Subjective:    Patient ID: Kim Barnes, female    DOB: 12/21/1975, 41 y.o.   MRN: [id number]  HPI The patient is a 41 YO female coming in for left lower quadrant pain. Pain is 9/10 intensity with flare and 4/10 all the time. Started several days ago and not changed since then. Denies diarrhea, constipation, nausea, vomiting. Denies rash on skin. Took ibuprofen 800 mg which did not help. Does have known PCOS and irregular periods. Spotting last month 2 days, unprotected sex since that time with 1 partner. Denies dysuria, frqeuency, urgency. Denies back pain or blood in urine. No vaginal discharge or pain.  Review of Systems  Constitutional: Negative.   HENT: Negative.   Eyes: Negative.   Respiratory: Negative for cough, chest tightness and shortness of breath.   Cardiovascular: Negative for chest pain, palpitations and leg swelling.  Gastrointestinal: Positive for abdominal distention and abdominal pain. Negative for blood in stool, constipation, diarrhea, nausea and vomiting.  Musculoskeletal: Negative.   Skin: Negative.   Neurological: Negative.   Psychiatric/Behavioral: Negative.       Objective:   Physical Exam  Constitutional: She is oriented to person, place, and time. She appears well-developed and well-nourished.  Overweight  HENT:  Head: Normocephalic and atraumatic.  Eyes: EOM are normal.  Neck: Normal range of motion.  Cardiovascular: Normal rate and regular rhythm.  Pulmonary/Chest: Effort normal and breath sounds normal. No respiratory distress. She has no wheezes. She has no rales.  Abdominal: Soft. Bowel sounds are normal. She exhibits no distension and no mass. There is tenderness. There is no rebound and no guarding.  Some pain LLQ with some increased pressure with palpation, no rebound or guarding  Musculoskeletal: She exhibits no edema.  Neurological: She is alert and oriented to person, place, and time. Coordination normal.  Skin: Skin is warm and dry.    Psychiatric: She has a normal mood and affect.   Vitals:   11/06/17 0944 11/06/17 1026  BP: (!) 170/120 (!) 160/120  Pulse: 75   Temp: 97.9 F (36.6 C)   TempSrc: Oral   SpO2: 99%   Weight: (!) 390 lb (176.9 kg)   Height: 5\' 6"  (1.676 m)       Assessment & Plan:  POC pregnancy test: negative

## 2017-11-06 NOTE — Patient Instructions (Signed)
The pregnancy test is negative.   We have sent in tramadol to the pharmacy to take for pain. If this is an ovary cyst which is the most likely than the ibuprofen will help the best so take 800 mg three times a day for the next 5 days.   We will get the CT scan.

## 2017-11-07 DIAGNOSIS — R1032 Left lower quadrant pain: Secondary | ICD-10-CM | POA: Insufficient documentation

## 2017-11-07 NOTE — Assessment & Plan Note (Signed)
Suspect ovarian cyst with PCOS diagnosis. Rx for tramadol and advised ibuprofen 800 mg TID. Ordered CT abdomen to rule out diverticulitis. Checking CBC and CMP.

## 2017-11-11 ENCOUNTER — Ambulatory Visit (INDEPENDENT_AMBULATORY_CARE_PROVIDER_SITE_OTHER)
Admission: RE | Admit: 2017-11-11 | Discharge: 2017-11-11 | Disposition: A | Discharge status: Home / Self Care | Payer: 59 | Source: Ambulatory Visit | Attending: Internal Medicine | Admitting: Internal Medicine

## 2017-11-11 DIAGNOSIS — R1032 Left lower quadrant pain: Secondary | ICD-10-CM | POA: Diagnosis not present

## 2017-11-11 MED ORDER — IOPAMIDOL (ISOVUE-300) INJECTION 61%
100.0000 mL | Freq: Once | INTRAVENOUS | Status: AC | PRN
  Administered 2017-11-11: 100 mL via INTRAVENOUS

## 2017-11-12 ENCOUNTER — Inpatient Hospital Stay (HOSPITAL_COMMUNITY)
Admission: AD | Admit: 2017-11-12 | Discharge: 2017-11-12 | Disposition: A | Discharge status: Home / Self Care | Payer: 59 | Source: Ambulatory Visit | Attending: Obstetrics and Gynecology | Admitting: Obstetrics and Gynecology

## 2017-11-12 ENCOUNTER — Encounter (HOSPITAL_COMMUNITY): Payer: Self-pay | Admitting: *Deleted

## 2017-11-12 DIAGNOSIS — M159 Polyosteoarthritis, unspecified: Secondary | ICD-10-CM

## 2017-11-12 DIAGNOSIS — N760 Acute vaginitis: Secondary | ICD-10-CM

## 2017-11-12 DIAGNOSIS — E669 Obesity, unspecified: Secondary | ICD-10-CM | POA: Diagnosis not present

## 2017-11-12 DIAGNOSIS — I10 Essential (primary) hypertension: Secondary | ICD-10-CM | POA: Insufficient documentation

## 2017-11-12 DIAGNOSIS — Z79899 Other long term (current) drug therapy: Secondary | ICD-10-CM | POA: Insufficient documentation

## 2017-11-12 DIAGNOSIS — N926 Irregular menstruation, unspecified: Secondary | ICD-10-CM | POA: Diagnosis present

## 2017-11-12 DIAGNOSIS — K219 Gastro-esophageal reflux disease without esophagitis: Secondary | ICD-10-CM | POA: Diagnosis not present

## 2017-11-12 DIAGNOSIS — M25552 Pain in left hip: Secondary | ICD-10-CM | POA: Diagnosis present

## 2017-11-12 DIAGNOSIS — J45909 Unspecified asthma, uncomplicated: Secondary | ICD-10-CM | POA: Insufficient documentation

## 2017-11-12 DIAGNOSIS — M47816 Spondylosis without myelopathy or radiculopathy, lumbar region: Secondary | ICD-10-CM | POA: Diagnosis not present

## 2017-11-12 DIAGNOSIS — M1612 Unilateral primary osteoarthritis, left hip: Secondary | ICD-10-CM | POA: Insufficient documentation

## 2017-11-12 DIAGNOSIS — B9689 Other specified bacterial agents as the cause of diseases classified elsewhere: Secondary | ICD-10-CM

## 2017-11-12 LAB — URINALYSIS, ROUTINE W REFLEX MICROSCOPIC
BILIRUBIN URINE: NEGATIVE
Glucose, UA: NEGATIVE mg/dL
KETONES UR: NEGATIVE mg/dL
LEUKOCYTES UA: NEGATIVE
NITRITE: NEGATIVE
PROTEIN: NEGATIVE mg/dL
Specific Gravity, Urine: 1.011 (ref 1.005–1.030)
pH: 7 (ref 5.0–8.0)

## 2017-11-12 LAB — WET PREP, GENITAL
Sperm: NONE SEEN
TRICH WET PREP: NONE SEEN
Yeast Wet Prep HPF POC: NONE SEEN

## 2017-11-12 MED ORDER — METRONIDAZOLE 500 MG PO TABS: 500.0000 mg | ORAL_TABLET | Freq: Two times a day (BID) | ORAL | 0 refills | Status: AC

## 2017-11-12 MED ORDER — OXYCODONE-ACETAMINOPHEN 5-325 MG PO TABS
1.0000 | ORAL_TABLET | Freq: Four times a day (QID) | ORAL | 0 refills | Status: DC | PRN
Start: 2017-11-12 — End: 2018-02-26

## 2017-11-12 NOTE — Discharge Instructions (Signed)

## 2017-11-12 NOTE — MAU Provider Note (Signed)
Chief Complaint: Abdominal Pain   SUBJECTIVE HPI: Kim Barnes is a 41 y.o. G2P0020 who presents to MAU with left-sided hip pain.  Patient states that pain is been present for the last 2 weeks.  Pain comes and goes.  Patient states that earlier today when she was moving she heard a pop. Pain shortly after and could not move as well.  Rates pain 10 out of 10. Was seen at her provider office recently for abdominal pain. Had a CT yesterday and was told it was unremarkable.  Patient states that she has irregular menstrual periods.  Provider was concerned about her ovaries and pelvic organs.  Patient states that she thought she heard today's never happened before.  Denies fever, tingling, weakness.  Having gait abnormality since pop. Endorsing some vaginal discharge without blood.    Past Medical History:  Diagnosis Date  . Asthma    prn inhaler  . Carpal tunnel syndrome of left wrist 01/2013  . GERD (gastroesophageal reflux disease)    occasional; no current med.  [name]. History of brain tumor 1996   no neurologic deficits  . HTN (hypertension)    under control with med., has been on med. x 5 yr.  . Obesity   . Polycystic ovarian syndrome    no current med.; irregular periods   OB History  Gravida Para Term Preterm AB Living  2       2 0  SAB TAB Ectopic Multiple Live Births  1 1     0    # Outcome Date GA Lbr Len/2nd Weight Sex Delivery Anes PTL Lv  2 SAB           1 TAB              Past Surgical History:  Procedure Laterality Date  . BRAIN SURGERY  1996   exc. tumor  . CARPAL TUNNEL RELEASE Left 02/10/2013   Procedure: CARPAL TUNNEL RELEASE;  Surgeon: Nicki ReaperGary R Kuzma, MD;  Location: [facility];  Service: Orthopedics;  Laterality: Left;  ANESTHESIA: IV REGIONAL FAB  . HYSTEROSCOPY W/D&C  02/21/2006  . HYSTEROSCOPY W/D&C  12/17/2011   Procedure: DILATATION AND CURETTAGE /HYSTEROSCOPY;  Surgeon: Meriel Picaichard M Holland, MD;  Location: [facility];  Service: Gynecology;  Laterality: N/A;   . LEEP  01/07/2004  . TONSILLECTOMY  09/21/2002   Social History   Socioeconomic History  . Marital status: Single    Spouse name: Not on file  . Number of children: Not on file  . Years of education: Not on file  . Highest education level: Not on file  Social Needs  . Financial resource strain: Not on file  . Food insecurity - worry: Not on file  . Food insecurity - inability: Not on file  . Transportation needs - medical: Not on file  . Transportation needs - non-medical: Not on file  Occupational History  . Occupation: Patient Acc Rep  Tobacco Use  . Smoking status: Never Smoker  . Smokeless tobacco: Never Used  Substance and Sexual Activity  . Alcohol use: Yes    Comment: rarely  . Drug use: No  . Sexual activity: Yes    Birth control/protection: Pill  Other Topics Concern  . Not on file  Social History Narrative  . Not on file   No current facility-administered medications on file prior to encounter.    Current Outpatient Medications on File Prior to Encounter  Medication Sig Dispense Refill  . albuterol (PROAIR  HFA) 108 (90 Base) MCG/ACT inhaler Inhale 2 puffs into the lungs every 6 (six) hours as needed for wheezing. 1 Inhaler 11  . b complex vitamins tablet Take 1 tablet by mouth daily. 100 tablet 3  . famotidine (PEPCID) 20 MG tablet One at bedtime 30 tablet 2  . fluticasone (FLONASE) 50 MCG/ACT nasal spray Place into the nose.    . fluticasone (FLOVENT HFA) 110 MCG/ACT inhaler Inhale 2 puffs into the lungs 2 (two) times daily. 1 Inhaler 4  . ibuprofen (ADVIL,MOTRIN) 200 MG tablet Take 1,200 mg by mouth daily as needed for moderate pain (tooth pain).    [name]. loratadine (CLARITIN) 10 MG tablet Take 1 tablet (10 mg total) by mouth daily. 30 tablet 6  . metFORMIN (GLUCOPHAGE) 500 MG tablet Take 1 tablet by mouth  daily with breakfast 30 tablet 0  . Mometasone Furoate (ASMANEX HFA) 200 MCG/ACT AERO Inhale 2 Inhalers into the lungs 2 (two) times daily. 1 Inhaler 11  .  montelukast (SINGULAIR) 10 MG tablet Take 1 tablet (10 mg total) by mouth daily. 90 tablet 3  . Nebivolol HCl (BYSTOLIC) 20 MG TABS Take 1 tablet (20 mg total) by mouth daily. (Patient not taking: Reported on 11/06/2017) 30 tablet 0  . pantoprazole (PROTONIX) 40 MG tablet Take 1 tablet (40 mg total) by mouth daily. Take 30-60 min before first meal of the day 30 tablet 2  . potassium chloride SA (K-DUR,KLOR-CON) 20 MEQ tablet Take 1 tablet (20 mEq total) by mouth daily. 30 tablet 0  . traMADol (ULTRAM) 50 MG tablet Take 1 tablet (50 mg total) by mouth every 8 (eight) hours as needed. 30 tablet 0  . triamterene-hydrochlorothiazide (MAXZIDE-25) 37.5-25 MG tablet Take 1 tablet by mouth daily. (Patient not taking: Reported on 11/06/2017) 30 tablet 11   No Known Allergies  I have reviewed the past Medical Hx, Surgical Hx, Social Hx, Allergies and Medications.   REVIEW OF SYSTEMS All systems reviewed and are negative for acute change except as noted in the HPI.   OBJECTIVE BP (!) 145/90 (BP Location: Right Arm)   Pulse 84   Temp (P) 98.8 F (37.1 C) (Oral)   Resp (P) 20   Ht (P) 5\' 6"  (1.676 m)   Wt (!) (P) 176.4 kg (389 lb)   LMP 11/09/2017   BMI (P) 62.79 kg/m    PHYSICAL EXAM Constitutional: Well-developed, well-nourished obese female in no acute distress.  Cardiovascular: normal rate and rhythm, pulses intact Respiratory: normal rate and effort.  GI: Abd soft, non-tender, non-distended. MS: Extremities nontender, no edema, normal ROM. Pain with straight leg raise on left. Neurologic: Alert and oriented x 4. No focal deficits. Strength and sensation intact. Antalgic gait Psych: normal mood and affect  LAB RESULTS Results for orders placed or performed during the hospital encounter of 11/12/17 (from the past 24 hour(s))  Wet prep, genital     Status: Abnormal   Collection Time: 11/12/17  8:35 PM  Result Value Ref Range   Yeast Wet Prep HPF POC NONE SEEN NONE SEEN   Trich, Wet  Prep NONE SEEN NONE SEEN   Clue Cells Wet Prep HPF POC PRESENT (A) NONE SEEN   WBC, Wet Prep HPF POC FEW (A) NONE SEEN   Sperm NONE SEEN   Urinalysis, Routine w reflex microscopic     Status: Abnormal   Collection Time: 11/12/17  8:35 PM  Result Value Ref Range   Color, Urine YELLOW YELLOW   APPearance HAZY (A)  CLEAR   Specific Gravity, Urine 1.011 1.005 - 1.030   pH 7.0 5.0 - 8.0   Glucose, UA NEGATIVE NEGATIVE mg/dL   Hgb urine dipstick MODERATE (A) NEGATIVE   Bilirubin Urine NEGATIVE NEGATIVE   Ketones, ur NEGATIVE NEGATIVE mg/dL   Protein, ur NEGATIVE NEGATIVE mg/dL   Nitrite NEGATIVE NEGATIVE   Leukocytes, UA NEGATIVE NEGATIVE   RBC / HPF 0-5 0 - 5 RBC/hpf   WBC, UA 0-5 0 - 5 WBC/hpf   Bacteria, UA RARE (A) NONE SEEN   Squamous Epithelial / LPF 6-30 (A) NONE SEEN   Mucus PRESENT     IMAGING Ct Abdomen Pelvis W Contrast  Result Date: 11/11/2017 CLINICAL DATA:  41 year old female with intermittent left lower quadrant pain for the past few months now persistent and more severe over the past 2 weeks. Initial encounter. EXAM: CT ABDOMEN AND PELVIS WITH CONTRAST TECHNIQUE: Multidetector CT imaging of the abdomen and pelvis was performed using the standard protocol following bolus administration of intravenous contrast. CONTRAST:  ISOVUE-300 IOPAMIDOL (ISOVUE-300) INJECTION 61% COMPARISON:  None. FINDINGS: Lower chest: No worrisome lung base abnormality.  Mild cardiomegaly. Hepatobiliary: Enlarged fatty liver spanning over 21.6 cm without focal hepatic lesion. No calcified gallstone. Pancreas: No pancreatic mass or inflammation. Spleen: No splenic mass or enlargement. Adrenals/Urinary Tract: No obstructing stone or hydronephrosis. No renal or adrenal mass. Noncontrast filled imaging urinary bladder without gross abnormality. Evaluation of pelvic structures limited by artifact. Stomach/Bowel: No extraluminal bowel inflammatory process is noted. Appendix top-normal in size without  surrounding inflammation. Portions of the stomach and colon are under distended and evaluation limited for detection of subtle abnormality. Vascular/Lymphatic: No abdominal aortic aneurysm or large vessel occlusion. Increase number of normal/top-normal size retroperitoneal and inguinal lymph nodes. Reproductive: Evaluation limited by streak artifact in the pelvis. No adnexal mass identified. Other: No bowel containing hernia.  No free intraperitoneal air. Musculoskeletal: Degenerative changes lower lumbar spine. No osseous destructive lesion. IMPRESSION: No extraluminal bowel inflammatory process. No adnexal abnormality noted as cause of patient's discomfort. Enlarged fatty liver. Increase number of normal/top-normal size retroperitoneal and inguinal lymph nodes of questionable etiology/ significance. Degenerative changes lower lumbar spine. Evaluation of pelvic structures slightly limited by artifact caused by patient's habitus. Electronically Signed   By: Lacy Duverney M.D.   On: 11/11/2017 14:54    MAU COURSE Vitals and nursing notes reviewed Wet prep with BV CT report reviewed of abdomen/pelvis that shows some degenerative changes   MDM Plan of care reviewed with patient, including labs and tests ordered and medical treatment.   ASSESSMENT 1. Osteoarthritis of multiple joints, unspecified osteoarthritis type   2. Bacterial vaginosis    DJD in lumbar spine and hip most consistent with exam and pain.   PLAN Discharge home in stable condition. Rx flagyl Counseled on return precautions Handout given PRN pain medications.  Follow-up with provider   Caryl Ada, DO [facility], [facility] -  11/12/2017, 10:30 PM

## 2017-11-12 NOTE — MAU Note (Signed)
PT SAYS SHE HAD CT DONE- RESULTS - COULD NOT SEE PELVIS OR OVARY .-   BY DR Okey DupreRAWFORD   WITH [facility] .     LEFT SIDED PAIN - STARTED   IN SEPT-  STOPPED  THEN STARTED AGAIN 2 WEEKS AGO .   THEN TODAY AT 649PM      SHE HEARD SOMETHING POP IN LEFT SIDE AND  FELT PAIN-    NO MED   LAST SEX- 2 WEEKS AGO.

## 2017-11-13 LAB — GC/CHLAMYDIA PROBE AMP (~~LOC~~) NOT AT ARMC
CHLAMYDIA, DNA PROBE: NEGATIVE
NEISSERIA GONORRHEA: NEGATIVE

## 2017-12-02 ENCOUNTER — Emergency Department (HOSPITAL_BASED_OUTPATIENT_CLINIC_OR_DEPARTMENT_OTHER)
Admission: EM | Admit: 2017-12-02 | Discharge: 2017-12-03 | Disposition: A | Discharge status: Home / Self Care | Payer: 59 | Attending: Emergency Medicine | Admitting: Emergency Medicine

## 2017-12-02 DIAGNOSIS — Z86011 Personal history of benign neoplasm of the brain: Secondary | ICD-10-CM | POA: Diagnosis not present

## 2017-12-02 DIAGNOSIS — R0602 Shortness of breath: Secondary | ICD-10-CM | POA: Insufficient documentation

## 2017-12-02 DIAGNOSIS — Z7984 Long term (current) use of oral hypoglycemic drugs: Secondary | ICD-10-CM | POA: Insufficient documentation

## 2017-12-02 DIAGNOSIS — I10 Essential (primary) hypertension: Secondary | ICD-10-CM | POA: Diagnosis not present

## 2017-12-02 DIAGNOSIS — Z79899 Other long term (current) drug therapy: Secondary | ICD-10-CM | POA: Insufficient documentation

## 2017-12-02 DIAGNOSIS — J45909 Unspecified asthma, uncomplicated: Secondary | ICD-10-CM | POA: Insufficient documentation

## 2017-12-02 DIAGNOSIS — R0789 Other chest pain: Secondary | ICD-10-CM | POA: Insufficient documentation

## 2017-12-03 ENCOUNTER — Emergency Department (HOSPITAL_BASED_OUTPATIENT_CLINIC_OR_DEPARTMENT_OTHER): Payer: 59

## 2017-12-03 ENCOUNTER — Encounter (HOSPITAL_BASED_OUTPATIENT_CLINIC_OR_DEPARTMENT_OTHER): Payer: Self-pay | Admitting: Emergency Medicine

## 2017-12-03 ENCOUNTER — Other Ambulatory Visit: Payer: Self-pay

## 2017-12-03 MED ORDER — KETOROLAC TROMETHAMINE 30 MG/ML IJ SOLN
30.0000 mg | Freq: Once | INTRAMUSCULAR | Status: AC
  Administered 2017-12-03: 30 mg via INTRAVENOUS

## 2017-12-03 MED ORDER — NAPROXEN 500 MG PO TABS
500.0000 mg | ORAL_TABLET | Freq: Two times a day (BID) | ORAL | 0 refills | Status: DC
Start: 2017-12-03 — End: 2018-02-26

## 2017-12-03 MED ORDER — KETOROLAC TROMETHAMINE 30 MG/ML IJ SOLN
30.0000 mg | Freq: Once | INTRAMUSCULAR | Status: DC
  Filled 2017-12-03: qty 1

## 2017-12-03 NOTE — ED Notes (Signed)
Patient transported to X-ray 

## 2017-12-03 NOTE — ED Notes (Signed)
ED Provider at bedside. 

## 2017-12-03 NOTE — ED Triage Notes (Addendum)
Pain radiating from left shoulder to upper sternum intermittently x2 days. Sts it first started when she reached for something.  Area is tender to touch.

## 2017-12-03 NOTE — ED Provider Notes (Signed)
[facility] EMERGENCY DEPARTMENT Provider Note   CSN: [id number] Arrival date & time: 12/02/17  2357     History   Chief Complaint Chief Complaint  Patient presents with  . Chest Pain    HPI Kim Barnes is a 41 y.o. female.  HPI  This is a 41 year old female with a history of asthma who presents with left shoulder and chest pain.  Ongoing for the last 2-3 days.  She states she first noticed it when she bent down to pick something up.  She thought she slept on it wrong.  It does seem tender to palpation.  However, tonight she was lying down when pain worsened.  She describes it as sharp and nonradiating.  Currently her pain is 6 out of 10.  She has not taken anything for her pain.  She also felt short of breath this evening.  She took her inhaler with minimal relief.  Denies any recent fevers or cough.  Denies any recent long travel, hospitalization, history of blood clots.  Past Medical History:  Diagnosis Date  . Asthma    prn inhaler  . Carpal tunnel syndrome of left wrist 01/2013  . GERD (gastroesophageal reflux disease)    occasional; no current med.  [name] History of brain tumor 1996   no neurologic deficits  . HTN (hypertension)    under control with med., has been on med. x 5 yr.  . Obesity   . Polycystic ovarian syndrome    no current med.; irregular periods    Patient Active Problem List   Diagnosis Date Noted  . LLQ abdominal pain 11/07/2017  . Bell palsy 02/05/2017  . OSA on CPAP 10/20/2015  . PCOS (polycystic ovarian syndrome) 03/31/2015  . Snoring 03/31/2015  . Well adult exam 10/01/2013  . Asthma exacerbation 08/27/2011  . GERD 09/20/2010  . Cough 05/24/2010  . DEPRESSION 12/06/2009  . Edema 05/12/2009  . DYSPNEA 05/12/2009  . Chest pain, unspecified 05/12/2009  . OBESITY, MORBID 07/07/2008  . Asthma 07/07/2008  . AMENORRHEA 07/07/2008  . Essential hypertension 09/08/2007  . Hyperglycemia 09/08/2007    Past Surgical History:    Procedure Laterality Date  . BRAIN SURGERY  1996   exc. tumor  . CARPAL TUNNEL RELEASE Left 02/10/2013   Procedure: CARPAL TUNNEL RELEASE;  Surgeon: Nicki Reaper, MD;  Location: [facility];  Service: Orthopedics;  Laterality: Left;  ANESTHESIA: IV REGIONAL FAB  . HYSTEROSCOPY W/D&C  02/21/2006  . HYSTEROSCOPY W/D&C  12/17/2011   Procedure: DILATATION AND CURETTAGE /HYSTEROSCOPY;  Surgeon: Meriel Pica, MD;  Location: [facility];  Service: Gynecology;  Laterality: N/A;  . LEEP  01/07/2004  . TONSILLECTOMY  09/21/2002    OB History    Gravida Para Term Preterm AB Living   2       2 0   SAB TAB Ectopic Multiple Live Births   1 1     0       Home Medications    Prior to Admission medications   Medication Sig Start Date End Date Taking? Authorizing Provider  albuterol (PROAIR HFA) 108 (90 Base) MCG/ACT inhaler Inhale 2 puffs into the lungs every 6 (six) hours as needed for wheezing. 03/21/16 12/04/17  Carollee Leitz, RN  b complex vitamins tablet Take 1 tablet by mouth daily. 02/05/17   Plotnikov, Georgina Quint, MD  fluticasone Aleda Grana) 50 MCG/ACT nasal spray Place into the nose. 02/27/17 02/27/18  [provider]  fluticasone (FLOVENT  HFA) 110 MCG/ACT inhaler Inhale 2 puffs into the lungs 2 (two) times daily. 05/31/16   Allegra GranaArnett, Margaret G, FNP  ibuprofen (ADVIL,MOTRIN) 200 MG tablet Take 1,200 mg by mouth daily as needed for moderate pain (tooth pain).    [provider]  loratadine (CLARITIN) 10 MG tablet Take 1 tablet (10 mg total) by mouth daily. 04/19/16   Allegra GranaArnett, Margaret G, FNP  metFORMIN (GLUCOPHAGE) 500 MG tablet Take 1 tablet by mouth  daily with breakfast 06/06/16   Plotnikov, Georgina QuintAleksei V, MD  Mometasone Furoate [facility](ASMANEX HFA) 200 MCG/ACT AERO Inhale 2 Inhalers into the lungs 2 (two) times daily. 06/01/16   Plotnikov, Georgina QuintAleksei V, MD  montelukast (SINGULAIR) 10 MG tablet Take 1 tablet (10 mg total) by mouth daily. 03/01/17 03/01/18  Plotnikov, Georgina QuintAleksei V, MD   naproxen (NAPROSYN) 500 MG tablet Take 1 tablet (500 mg total) by mouth 2 (two) times daily. 12/03/17   Maks Cavallero, Mayer Maskerourtney F, MD  Nebivolol HCl (BYSTOLIC) 20 MG TABS Take 1 tablet (20 mg total) by mouth daily. Patient not taking: Reported on 11/06/2017 03/21/16   Carollee Leitzoss, Carrie M, RN  oxyCODONE-acetaminophen (PERCOCET/ROXICET) 5-325 MG tablet Take 1 tablet by mouth every 6 (six) hours as needed for severe pain. 11/12/17   Pincus LargePhelps, Jazma Y, DO  pantoprazole (PROTONIX) 40 MG tablet Take 1 tablet (40 mg total) by mouth daily. Take 30-60 min before first meal of the day 05/24/14   Nyoka CowdenWert, Michael B, MD  triamterene-hydrochlorothiazide (MAXZIDE-25) 37.5-25 MG tablet Take 1 tablet by mouth daily. Patient not taking: Reported on 11/06/2017 03/01/17 03/01/18  Plotnikov, Georgina QuintAleksei V, MD    Family History Family History  Problem Relation Age of Onset  . Hypertension Other   . Lung cancer Father        Smoker  . Cancer Father        lung/ smoker  . Breast cancer Maternal Grandmother   . Cancer Maternal Grandmother        breast  . Emphysema Paternal Grandmother   . Asthma Mother   . Asthma Maternal Aunt     Social History Social History   Tobacco Use  . Smoking status: Never Smoker  . Smokeless tobacco: Never Used  Substance Use Topics  . Alcohol use: Yes    Comment: rare; socially  . Drug use: No     Allergies   Patient has no known allergies.   Review of Systems Review of Systems  Constitutional: Negative for fever.  Respiratory: Positive for shortness of breath. Negative for cough and wheezing.   Cardiovascular: Positive for chest pain. Negative for leg swelling.  All other systems reviewed and are negative.    Physical Exam Updated Vital Signs BP (!) 174/123   Pulse 74   Temp 98.1 F (36.7 C) (Oral)   Resp 16   Ht 5\' 6"  (1.676 m)   Wt (!) 176.4 kg (389 lb)   LMP 11/09/2017   SpO2 100%   BMI 62.79 kg/m   Physical Exam  Constitutional: She is oriented to person, place,  and time.  Morbidly obese, no acute distress  HENT:  Head: Normocephalic and atraumatic.  Eyes: Pupils are equal, round, and reactive to light.  Neck: Neck supple.  Cardiovascular: Normal rate, regular rhythm, normal heart sounds and normal pulses. Exam reveals no S3 and no S4.  Tenderness palpation left chest wall, no crepitus  Pulmonary/Chest: Effort normal. No tachypnea. No respiratory distress. She has no wheezes.  Abdominal: Soft. Bowel sounds are normal.  Musculoskeletal:       Right lower leg: She exhibits no edema.       Left lower leg: She exhibits no edema.  Neurological: She is alert and oriented to person, place, and time.  Skin: Skin is warm and dry.  Psychiatric: She has a normal mood and affect.  Nursing note and vitals reviewed.    ED Treatments / Results  Labs (all labs ordered are listed, but only abnormal results are displayed) Labs Reviewed - No data to display  EKG  EKG Interpretation  Date/Time:  Tuesday December 03 2017 00:03:42 EST Ventricular Rate:  76 PR Interval:    QRS Duration: 90 QT Interval:  363 QTC Calculation: 409 R Axis:   -8 Text Interpretation:  Sinus rhythm Probable left atrial enlargement LVH with secondary repolarization abnormality Confirmed by Ross MarcusHorton, Duglas Heier ([id number]) on 12/03/2017 12:07:35 AM       Radiology Dg Chest 2 View  Result Date: 12/03/2017 CLINICAL DATA:  41 year old female with chest pain for EXAM: CHEST  2 VIEW COMPARISON:  Chest radiograph dated 06/01/2016 FINDINGS: The heart size and mediastinal contours are within normal limits. Both lungs are clear. The visualized skeletal structures are unremarkable. IMPRESSION: No active cardiopulmonary disease. Electronically Signed   By: Elgie CollardArash  Radparvar M.D.   On: 12/03/2017 00:57    Procedures Procedures (including critical care time)  Medications Ordered in ED Medications  ketorolac (TORADOL) 30 MG/ML injection 30 mg (30 mg Intravenous Given 12/03/17 0034)      Initial Impression / Assessment and Plan / ED Course  I have reviewed the triage vital signs and the nursing notes.  Pertinent labs & imaging results that were available during my care of the patient were reviewed by me and considered in my medical decision making (see chart for details).     She presents with chest pain.  Nontoxic appearing on exam.  Vital signs reassuring.  Normal O2 sats.  EKG shows no evidence of ischemia doubt ACS.  There is a reproducible component on exam.  Patient is also low risk for PE.  Chest x-ray was obtained given shortness of breath.  Patient was given IM Toradol.  Patient reports complete resolution of pain with Toradol.  Chest x-ray shows no evidence of pneumonia or pneumothorax.  Patient reassured.  Suspect chest wall pain.  After history, exam, and medical workup I feel the patient has been appropriately medically screened and is safe for discharge home. Pertinent diagnoses were discussed with the patient. Patient was given return precautions.   Final Clinical Impressions(s) / ED Diagnoses   Final diagnoses:  Chest wall pain    ED Discharge Orders        Ordered    naproxen (NAPROSYN) 500 MG tablet  2 times daily     12/03/17 0102       Shon BatonHorton, Syleena Mchan F, MD 12/03/17 [phone]

## 2017-12-31 ENCOUNTER — Other Ambulatory Visit (INDEPENDENT_AMBULATORY_CARE_PROVIDER_SITE_OTHER): Payer: 59

## 2017-12-31 ENCOUNTER — Ambulatory Visit (INDEPENDENT_AMBULATORY_CARE_PROVIDER_SITE_OTHER): Payer: 59 | Admitting: Internal Medicine

## 2017-12-31 ENCOUNTER — Encounter: Payer: Self-pay | Admitting: Internal Medicine

## 2017-12-31 VITALS — BP 142/98 | HR 73 | Temp 98.3°F | Ht 66.0 in | Wt 386.0 lb

## 2017-12-31 DIAGNOSIS — E282 Polycystic ovarian syndrome: Secondary | ICD-10-CM | POA: Diagnosis not present

## 2017-12-31 DIAGNOSIS — Z0000 Encounter for general adult medical examination without abnormal findings: Secondary | ICD-10-CM

## 2017-12-31 DIAGNOSIS — Z23 Encounter for immunization: Secondary | ICD-10-CM | POA: Diagnosis not present

## 2017-12-31 DIAGNOSIS — J4520 Mild intermittent asthma, uncomplicated: Secondary | ICD-10-CM

## 2017-12-31 LAB — CBC WITH DIFFERENTIAL/PLATELET
BASOS PCT: 0.6 % (ref 0.0–3.0)
Basophils Absolute: 0 10*3/uL (ref 0.0–0.1)
EOS ABS: 0.1 10*3/uL (ref 0.0–0.7)
Eosinophils Relative: 2.2 % (ref 0.0–5.0)
HEMATOCRIT: 42.2 % (ref 36.0–46.0)
Hemoglobin: 14.3 g/dL (ref 12.0–15.0)
LYMPHS PCT: 31.7 % (ref 12.0–46.0)
Lymphs Abs: 1.8 10*3/uL (ref 0.7–4.0)
MCHC: 33.8 g/dL (ref 30.0–36.0)
MCV: 87.5 fl (ref 78.0–100.0)
Monocytes Absolute: 0.3 10*3/uL (ref 0.1–1.0)
Monocytes Relative: 5.9 % (ref 3.0–12.0)
NEUTROS ABS: 3.4 10*3/uL (ref 1.4–7.7)
Neutrophils Relative %: 59.6 % (ref 43.0–77.0)
PLATELETS: 281 10*3/uL (ref 150.0–400.0)
RBC: 4.82 Mil/uL (ref 3.87–5.11)
RDW: 15 % (ref 11.5–15.5)
WBC: 5.7 10*3/uL (ref 4.0–10.5)

## 2017-12-31 LAB — BASIC METABOLIC PANEL
BUN: 11 mg/dL (ref 6–23)
CHLORIDE: 100 meq/L (ref 96–112)
CO2: 31 mEq/L (ref 19–32)
CREATININE: 0.81 mg/dL (ref 0.40–1.20)
Calcium: 10.1 mg/dL (ref 8.4–10.5)
GFR: 100.94 mL/min (ref >60.00)
Glucose, Bld: 103 mg/dL — ABNORMAL HIGH (ref 70–99)
POTASSIUM: 3.6 meq/L (ref 3.5–5.1)
Sodium: 139 mEq/L (ref 135–145)

## 2017-12-31 LAB — HEPATIC FUNCTION PANEL
ALBUMIN: 4.1 g/dL (ref 3.5–5.2)
ALT: 16 U/L (ref 0–35)
AST: 19 U/L (ref 0–37)
Alkaline Phosphatase: 68 U/L (ref 39–117)
Bilirubin, Direct: 0 mg/dL (ref 0.0–0.3)
TOTAL PROTEIN: 7.7 g/dL (ref 6.0–8.3)
Total Bilirubin: 0.7 mg/dL (ref 0.2–1.2)

## 2017-12-31 LAB — URINALYSIS, ROUTINE W REFLEX MICROSCOPIC
Bilirubin Urine: NEGATIVE
Ketones, ur: NEGATIVE
Leukocytes, UA: NEGATIVE
Nitrite: NEGATIVE
SPECIFIC GRAVITY, URINE: 1.02 (ref 1.000–1.030)
Total Protein, Urine: NEGATIVE
Urine Glucose: NEGATIVE
Urobilinogen, UA: 0.2 (ref 0.0–1.0)
pH: 6 (ref 5.0–8.0)

## 2017-12-31 LAB — LIPID PANEL
CHOLESTEROL: 200 mg/dL (ref 0–200)
HDL: 45.3 mg/dL (ref >39.00)
LDL CALC: 138 mg/dL — AB (ref 0–99)
NonHDL: 154.76
TRIGLYCERIDES: 86 mg/dL (ref 0.0–149.0)
Total CHOL/HDL Ratio: 4
VLDL: 17.2 mg/dL (ref 0.0–40.0)

## 2017-12-31 LAB — TSH: TSH: 1.78 u[IU]/mL (ref 0.35–4.50)

## 2017-12-31 MED ORDER — NEBIVOLOL HCL 20 MG PO TABS: 20.0000 mg | ORAL_TABLET | Freq: Every day | ORAL | 11 refills | Status: DC

## 2017-12-31 MED ORDER — VITAMIN D3 50 MCG (2000 UT) PO CAPS: 2000.0000 [IU] | ORAL_CAPSULE | Freq: Every day | ORAL | 3 refills | Status: DC

## 2017-12-31 MED ORDER — MONTELUKAST SODIUM 10 MG PO TABS: 10.0000 mg | ORAL_TABLET | Freq: Every day | ORAL | 11 refills | Status: DC

## 2017-12-31 NOTE — Assessment & Plan Note (Signed)
Not taking Metformin

## 2017-12-31 NOTE — Addendum Note (Signed)
Addended by: Scarlett PrestoFRIEDENBACH, Taraji Mungo on: 12/31/2017 01:23 PM   Modules accepted: Orders

## 2017-12-31 NOTE — Progress Notes (Signed)
Subjective:  Patient ID: Kim Barnes, female    DOB: 11/16/1976  Age: 42 y.o. MRN: [id number]  CC: No chief complaint on file.   HPI Tiphany L Theresa MulliganMcRae presents for a well exam Ran out of Bystolic F/u HTN, asthma Declined flu shot  Outpatient Medications Prior to Visit  Medication Sig Dispense Refill  . b complex vitamins tablet Take 1 tablet by mouth daily. 100 tablet 3  . fluticasone (FLONASE) 50 MCG/ACT nasal spray Place into the nose.    . fluticasone (FLOVENT HFA) 110 MCG/ACT inhaler Inhale 2 puffs into the lungs 2 (two) times daily. 1 Inhaler 4  . ibuprofen (ADVIL,MOTRIN) 200 MG tablet Take 1,200 mg by mouth daily as needed for moderate pain (tooth pain).    [name]. loratadine (CLARITIN) 10 MG tablet Take 1 tablet (10 mg total) by mouth daily. 30 tablet 6  . metFORMIN (GLUCOPHAGE) 500 MG tablet Take 1 tablet by mouth  daily with breakfast 30 tablet 0  . Mometasone Furoate (ASMANEX HFA) 200 MCG/ACT AERO Inhale 2 Inhalers into the lungs 2 (two) times daily. 1 Inhaler 11  . montelukast (SINGULAIR) 10 MG tablet Take 1 tablet (10 mg total) by mouth daily. 90 tablet 3  . naproxen (NAPROSYN) 500 MG tablet Take 1 tablet (500 mg total) by mouth 2 (two) times daily. 30 tablet 0  . Nebivolol HCl (BYSTOLIC) 20 MG TABS Take 1 tablet (20 mg total) by mouth daily. 30 tablet 0  . oxyCODONE-acetaminophen (PERCOCET/ROXICET) 5-325 MG tablet Take 1 tablet by mouth every 6 (six) hours as needed for severe pain. 12 tablet 0  . pantoprazole (PROTONIX) 40 MG tablet Take 1 tablet (40 mg total) by mouth daily. Take 30-60 min before first meal of the day 30 tablet 2  . triamterene-hydrochlorothiazide (MAXZIDE-25) 37.5-25 MG tablet Take 1 tablet by mouth daily. 30 tablet 11  . albuterol (PROAIR HFA) 108 (90 Base) MCG/ACT inhaler Inhale 2 puffs into the lungs every 6 (six) hours as needed for wheezing. 1 Inhaler 11   No facility-administered medications prior to visit.     ROS Review of Systems  Constitutional:  Negative for activity change, appetite change, chills, fatigue and unexpected weight change.  HENT: Negative for congestion, mouth sores and sinus pressure.   Eyes: Negative for visual disturbance.  Respiratory: Negative for cough and chest tightness.   Gastrointestinal: Negative for abdominal pain and nausea.  Genitourinary: Negative for difficulty urinating, frequency and vaginal pain.  Musculoskeletal: Negative for back pain and gait problem.  Skin: Negative for pallor and rash.  Neurological: Negative for dizziness, tremors, weakness, numbness and headaches.  Psychiatric/Behavioral: Negative for confusion and sleep disturbance.    Objective:  BP (!) 142/98 (BP Location: Left Arm, Patient Position: Sitting, Cuff Size: Large)   Pulse 73   Temp 98.3 F (36.8 C) (Oral)   Ht 5\' 6"  (1.676 m)   Wt (!) 386 lb (175.1 kg)   SpO2 98%   BMI 62.30 kg/m   BP Readings from Last 3 Encounters:  12/31/17 (!) 142/98  12/03/17 (!) 151/108  11/12/17 (!) 145/90    Wt Readings from Last 3 Encounters:  12/31/17 (!) 386 lb (175.1 kg)  12/03/17 (!) 389 lb (176.4 kg)  11/12/17 (!) (P) 389 lb (176.4 kg)    Physical Exam  Constitutional: She appears well-developed. No distress.  HENT:  Head: Normocephalic.  Right Ear: External ear normal.  Left Ear: External ear normal.  Nose: Nose normal.  Mouth/Throat: Oropharynx is clear and  moist.  Eyes: Conjunctivae are normal. Pupils are equal, round, and reactive to light. Right eye exhibits no discharge. Left eye exhibits no discharge.  Neck: Normal range of motion. Neck supple. No JVD present. No tracheal deviation present. No thyromegaly present.  Cardiovascular: Normal rate, regular rhythm and normal heart sounds.  Pulmonary/Chest: No stridor. No respiratory distress. She has no wheezes.  Abdominal: Soft. Bowel sounds are normal. She exhibits no distension and no mass. There is no tenderness. There is no rebound and no guarding.  Musculoskeletal:  She exhibits no edema or tenderness.  Lymphadenopathy:    She has no cervical adenopathy.  Neurological: She displays normal reflexes. No cranial nerve deficit. She exhibits normal muscle tone. Coordination normal.  Skin: No rash noted. No erythema.  Psychiatric: She has a normal mood and affect. Her behavior is normal. Judgment and thought content normal.    Lab Results  Component Value Date   WBC 6.2 11/06/2017   HGB 14.4 11/06/2017   HCT 42.9 11/06/2017   PLT 286.0 11/06/2017   GLUCOSE 95 11/06/2017   CHOL 183 03/31/2015   TRIG 62.0 03/31/2015   HDL 39.50 03/31/2015   LDLDIRECT 149.4 05/24/2010   LDLCALC 131 (H) 03/31/2015   ALT 17 11/06/2017   AST 18 11/06/2017   NA 140 11/06/2017   K 3.5 11/06/2017   CL 101 11/06/2017   CREATININE 0.78 11/06/2017   BUN 8 11/06/2017   CO2 33 (H) 11/06/2017   TSH 1.12 10/01/2013   INR 1.16 02/04/2017   HGBA1C 5.3 03/31/2015    Dg Chest 2 View  Result Date: 12/03/2017 CLINICAL DATA:  42 year old female with chest pain for EXAM: CHEST  2 VIEW COMPARISON:  Chest radiograph dated 06/01/2016 FINDINGS: The heart size and mediastinal contours are within normal limits. Both lungs are clear. The visualized skeletal structures are unremarkable. IMPRESSION: No active cardiopulmonary disease. Electronically Signed   By: Elgie Collard M.D.   On: 12/03/2017 00:57    Assessment & Plan:   There are no diagnoses linked to this encounter. I am having Nohelani L. Fornes maintain her pantoprazole, ibuprofen, albuterol, Nebivolol HCl, loratadine, fluticasone, Mometasone Furoate, metFORMIN, b complex vitamins, fluticasone, triamterene-hydrochlorothiazide, montelukast, oxyCODONE-acetaminophen, and naproxen.  No orders of the defined types were placed in this encounter.    Follow-up: No Follow-up on file.  Sonda Primes, MD

## 2017-12-31 NOTE — Assessment & Plan Note (Signed)
Singulair restarted Prevnar Declined a flu shot

## 2017-12-31 NOTE — Assessment & Plan Note (Signed)
We discussed age appropriate health related issues, including available/recomended screening tests and vaccinations. We discussed a need for adhering to healthy diet and exercise. Labs were ordered to be later reviewed . All questions were answered.   

## 2017-12-31 NOTE — Assessment & Plan Note (Addendum)
Discussed ref to Dr Beasley 

## 2018-02-20 ENCOUNTER — Encounter (INDEPENDENT_AMBULATORY_CARE_PROVIDER_SITE_OTHER): Payer: Self-pay

## 2018-02-26 ENCOUNTER — Ambulatory Visit (INDEPENDENT_AMBULATORY_CARE_PROVIDER_SITE_OTHER): Payer: 59 | Admitting: Family Medicine

## 2018-02-26 ENCOUNTER — Encounter (INDEPENDENT_AMBULATORY_CARE_PROVIDER_SITE_OTHER): Payer: Self-pay | Admitting: Family Medicine

## 2018-02-26 VITALS — BP 176/111 | HR 96 | Temp 98.1°F | Ht 66.0 in | Wt 378.0 lb

## 2018-02-26 DIAGNOSIS — Z9189 Other specified personal risk factors, not elsewhere classified: Secondary | ICD-10-CM

## 2018-02-26 DIAGNOSIS — R5383 Other fatigue: Secondary | ICD-10-CM | POA: Diagnosis not present

## 2018-02-26 DIAGNOSIS — G4733 Obstructive sleep apnea (adult) (pediatric): Secondary | ICD-10-CM | POA: Insufficient documentation

## 2018-02-26 DIAGNOSIS — Z0289 Encounter for other administrative examinations: Secondary | ICD-10-CM

## 2018-02-26 DIAGNOSIS — I10 Essential (primary) hypertension: Secondary | ICD-10-CM

## 2018-02-26 DIAGNOSIS — Z6841 Body Mass Index (BMI) 40.0 and over, adult: Secondary | ICD-10-CM | POA: Diagnosis not present

## 2018-02-26 DIAGNOSIS — R0602 Shortness of breath: Secondary | ICD-10-CM | POA: Insufficient documentation

## 2018-02-26 DIAGNOSIS — Z1331 Encounter for screening for depression: Secondary | ICD-10-CM | POA: Diagnosis not present

## 2018-02-26 NOTE — Progress Notes (Signed)
Office: [phone]  /  Fax: [phone]   Dear Dr. Posey Rea,   Thank you for referring Kim Barnes to our clinic. The following note includes my evaluation and treatment recommendations.  HPI:   Chief Complaint: OBESITY    Kim Barnes has been referred by Kim Quint. Plotnikov, MD for consultation regarding her obesity and obesity related comorbidities.    Kim Barnes (MR# [id number]) is a 42 y.o. female who presents on 02/26/2018 for obesity evaluation and treatment. Current BMI is Body mass index is 61.01 kg/m.[name] Kim Barnes has been struggling with her weight for many years and has been unsuccessful in either losing weight, maintaining weight loss, or reaching her healthy weight goal.     Kim Barnes attended our information session and states she is currently in the action stage of change and ready to dedicate time achieving and maintaining a healthier weight. Kim Barnes is interested in becoming our patient and working on intensive lifestyle modifications including (but not limited to) diet, exercise and weight loss.    Kim Barnes states her family eats meals together she thinks her family will eat healthier with  her her desired weight loss is 132 lbs she has been heavy most of  her life her heaviest weight ever was 386 lbs. she is a picky eater and doesn't like to eat healthier foods  she has significant food cravings issues  she snacks frequently in the evenings she skips meals frequently she is frequently drinking liquids with calories she frequently makes poor food choices she frequently eats larger portions than normal  she has binge eating behaviors she struggles with emotional eating    Fatigue Kim Barnes feels her energy is lower than it should be. This has worsened with weight gain and has not worsened recently. [name] admits to daytime somnolence and admits to waking up still tired. Patient has a diagnosis of obstructive sleep apnea which may be contributing to her fatigue.  Patent has a history of symptoms of daytime fatigue, morning fatigue and hypertension. Patient generally gets 7 hours of sleep per night, and states they generally have restful sleep. Snoring is present. Apneic episodes are present. Epworth Sleepiness Score is 4  Dyspnea on exertion Kim Barnes notes increasing shortness of breath with exercising and seems to be worsening over time with weight gain. She notes getting out of breath sooner with activity than she used to. This has not gotten worse recently. Kim Barnes denies orthopnea.  Hypertension Kim Barnes is a 42 y.o. female with hypertension. Her blood pressure is elevated at 176/111 today. Kim Barnes has uncontrolled sleep apnea and she is often forgetting her medications. [name] denies chest pain or headache. She is working weight loss to help control her blood pressure with the goal of decreasing her risk of heart attack and stroke. Kim Barnes blood pressure is not currently controlled.  At risk for cardiovascular disease Kim Barnes is at a higher than average risk for cardiovascular disease due to obesity and hypertension. She currently denies any chest pain.  Sleep Apnea  Kim Barnes has a CPAP, but she is not wearing her CPAP. She wakes unrefreshed and resting metabolic rate is significantly lower than expected.  Depression Screen Kim Barnes Food and Mood (modified PHQ-9) score was  Depression screen PHQ 2/9 02/26/2018  Decreased Interest 2  Down, Depressed, Hopeless 1  PHQ - 2 Score 3  Altered sleeping 2  Tired, decreased energy 2  Change in appetite 2  Feeling bad or failure about yourself  2  Trouble concentrating  1  Moving slowly or fidgety/restless 1  Suicidal thoughts 0  PHQ-9 Score 13  Difficult doing work/chores Somewhat difficult    ALLERGIES: No Known Allergies  MEDICATIONS: Current Outpatient Medications on File Prior to Visit  Medication Sig Dispense Refill  . ibuprofen (ADVIL,MOTRIN) 200 MG tablet Take 1,200 mg by mouth  daily as needed for moderate pain (tooth pain).    . Nebivolol HCl (BYSTOLIC) 20 MG TABS Take 1 tablet (20 mg total) by mouth daily. 30 tablet 11  . triamterene-hydrochlorothiazide (MAXZIDE-25) 37.5-25 MG tablet Take 1 tablet by mouth daily. 30 tablet 11   No current facility-administered medications on file prior to visit.     PAST MEDICAL HISTORY: Past Medical History:  Diagnosis Date  . Anxiety   . Asthma    prn inhaler  . Back pain   . Bell's palsy   . Carpal tunnel syndrome of left wrist 01/2013  . Chest pain   . Cold hands and feet   . Cough   . Depression   . Dry mouth   . Dry skin   . Fatigue   . GERD (gastroesophageal reflux disease)    occasional; no current med.  [name] History of brain tumor 1996   no neurologic deficits  . HTN (hypertension)    under control with med., has been on med. x 5 yr.  . Joint pain   . Obesity   . Obstructive sleep apnea   . PCOS (polycystic ovarian syndrome)   . Polycystic ovarian syndrome    no current med.; irregular periods  . Shortness of breath   . Stress   . Swelling of extremity   . Wheezing     PAST SURGICAL HISTORY: Past Surgical History:  Procedure Laterality Date  . BRAIN SURGERY  1996   exc. tumor  . CARPAL TUNNEL RELEASE Left 02/10/2013   Procedure: CARPAL TUNNEL RELEASE;  Surgeon: Nicki Reaper, MD;  Location: [facility];  Service: Orthopedics;  Laterality: Left;  ANESTHESIA: IV REGIONAL FAB  . HYSTEROSCOPY W/D&C  02/21/2006  . HYSTEROSCOPY W/D&C  12/17/2011   Procedure: DILATATION AND CURETTAGE /HYSTEROSCOPY;  Surgeon: Meriel Pica, MD;  Location: [facility];  Service: Gynecology;  Laterality: N/A;  . LEEP  01/07/2004  . TONSILLECTOMY  09/21/2002    SOCIAL HISTORY: Social History   Tobacco Use  . Smoking status: Never Smoker  . Smokeless tobacco: Never Used  Substance Use Topics  . Alcohol use: Yes    Comment: rare; socially  . Drug use: No    FAMILY HISTORY: Family History  Problem Relation  Age of Onset  . Hypertension Other   . Lung cancer Father        Smoker  . Cancer Father        lung/ smoker  . Heart disease Father   . Alcoholism Father   . Breast cancer Maternal Grandmother   . Cancer Maternal Grandmother        breast  . Emphysema Paternal Grandmother   . Asthma Mother   . Hyperlipidemia Mother   . Hypertension Mother   . Sleep apnea Mother   . Asthma Maternal Aunt     ROS: Review of Systems  Constitutional: Positive for malaise/fatigue.  HENT:       Dry Mouth  Eyes:       Vision Changes Wear Glasses or Contacts  Respiratory: Positive for cough, shortness of breath (on exertion) and wheezing.   Cardiovascular: Negative for chest pain  and orthopnea.       Very Cold Feet or Hands  Skin:       Dryness  Neurological: Negative for headaches.  Psychiatric/Behavioral:       Stress     PHYSICAL EXAM: Blood pressure (!) 176/111, pulse 96, temperature 98.1 F (36.7 C), temperature source Oral, height 5\' 6"  (1.676 m), weight (!) 378 lb (171.5 kg), SpO2 99 %. Body mass index is 61.01 kg/m. Physical Exam  Constitutional: She is oriented to person, place, and time. She appears well-developed and well-nourished.  HENT:  Head: Normocephalic and atraumatic.  Nose: Nose normal.  Eyes: EOM are normal. No scleral icterus.  Neck: Normal range of motion. Neck supple. No thyromegaly present.  Cardiovascular: Normal rate and regular rhythm.  Pulmonary/Chest: Effort normal. No respiratory distress.  Abdominal: Soft. There is no tenderness.  +obesity  Musculoskeletal: Normal range of motion.  Range of Motion normal in all 4 extremities  Neurological: She is alert and oriented to person, place, and time. Coordination normal.  Skin: Skin is warm and dry.  Psychiatric: She has a normal mood and affect.  Vitals reviewed.   RECENT LABS AND TESTS: BMET    Component Value Date/Time   NA 139 12/31/2017 0926   K 3.6 12/31/2017 0926   CL 100 12/31/2017 0926    CO2 31 12/31/2017 0926   GLUCOSE 103 (H) 12/31/2017 0926   GLUCOSE 83 10/08/2006 1418   BUN 11 12/31/2017 0926   CREATININE 0.81 12/31/2017 0926   CALCIUM 10.1 12/31/2017 0926   GFRNONAA >60 02/04/2017 1851   GFRAA >60 02/04/2017 1851   Lab Results  Component Value Date   HGBA1C 5.3 03/31/2015   No results found for: INSULIN CBC    Component Value Date/Time   WBC 5.7 12/31/2017 0926   RBC 4.82 12/31/2017 0926   HGB 14.3 12/31/2017 0926   HCT 42.2 12/31/2017 0926   PLT 281.0 12/31/2017 0926   MCV 87.5 12/31/2017 0926   MCH 29.3 02/04/2017 1851   MCHC 33.8 12/31/2017 0926   RDW 15.0 12/31/2017 0926   LYMPHSABS 1.8 12/31/2017 0926   MONOABS 0.3 12/31/2017 0926   EOSABS 0.1 12/31/2017 0926   BASOSABS 0.0 12/31/2017 0926   Iron/TIBC/Ferritin/ %Sat No results found for: IRON, TIBC, FERRITIN, IRONPCTSAT Lipid Panel     Component Value Date/Time   CHOL 200 12/31/2017 0926   TRIG 86.0 12/31/2017 0926   HDL 45.30 12/31/2017 0926   CHOLHDL 4 12/31/2017 0926   VLDL 17.2 12/31/2017 0926   LDLCALC 138 (H) 12/31/2017 0926   LDLDIRECT 149.4 05/24/2010 0904   Hepatic Function Panel     Component Value Date/Time   PROT 7.7 12/31/2017 0926   ALBUMIN 4.1 12/31/2017 0926   AST 19 12/31/2017 0926   ALT 16 12/31/2017 0926   ALKPHOS 68 12/31/2017 0926   BILITOT 0.7 12/31/2017 0926   BILIDIR 0.0 12/31/2017 0926      Component Value Date/Time   TSH 1.78 12/31/2017 0926   TSH 1.12 10/01/2013 1056   TSH 1.75 04/20/2011 1518    ECG  shows NSR with a rate of 80 BPM INDIRECT CALORIMETER done today shows a VO2 of 211 and a REE of 1470.  Her calculated basal metabolic rate is 4098 thus her basal metabolic rate is worse than expected.    ASSESSMENT AND PLAN: Other fatigue - Plan: EKG 12-Lead, Vitamin B12, CBC With Differential, Comprehensive metabolic panel, Folate, Hemoglobin A1c, Insulin, random, Lipid Panel With LDL/HDL Ratio, T3, T4,  free, TSH, VITAMIN D 25 Hydroxy (Vit-D  Deficiency, Fractures)  Shortness of breath on exertion - Plan: CBC With Differential  Essential hypertension - Plan: Comprehensive metabolic panel  Obstructive sleep apnea syndrome  Depression screening  At risk for heart disease  Class 3 severe obesity with serious comorbidity and body mass index (BMI) of 60.0 to 69.9 in adult, unspecified obesity type (HCC)  PLAN: Fatigue Serenitee was informed that her fatigue may be related to obesity, depression or many other causes. Labs will be ordered, and in the meanwhile Digna has agreed to work on diet, exercise and weight loss to help with fatigue. Proper sleep hygiene was discussed including the need for 7-8 hours of quality sleep each night. A sleep study was not ordered based on symptoms and Epworth score.  Dyspnea on exertion Kim Barnes shortness of breath appears to be obesity related and exercise induced. She has agreed to work on weight loss and gradually increase exercise to treat her exercise induced shortness of breath. If Erendira follows our instructions and loses weight without improvement of her shortness of breath, we will plan to refer to pulmonology. We will monitor this condition regularly. Selena agrees to this plan.  Hypertension We discussed sodium restriction, working on healthy weight loss, and a regular exercise program as the means to achieve improved blood pressure control. Sahara agreed with this plan and agreed to follow up as directed. We will continue to monitor her blood pressure as well as her progress with the above lifestyle modifications. She will continue her medications as prescribed and will watch for signs of hypotension as she continues her lifestyle modifications. Kim Barnes was given a weekly pill box today to help her remember her medications. I advised her of the risks of stroke, myocardial infarction and death. Saundra agreed to start taking her medications regularly.  Cardiovascular risk counseling Kim Barnes was  given extended (30 minutes) coronary artery disease prevention counseling today. She is 42 y.o. female and has risk factors for heart disease including obesity. We discussed intensive lifestyle modifications today with an emphasis on specific weight loss instructions and strategies. Pt was also informed of the importance of increasing exercise and decreasing saturated fats to help prevent heart disease.  Sleep Apnea Kim Barnes was advised of the risks of uncontrolled sleep apnea, such as, weight gain, elevated blood pressure and heart failure. Kim Barnes agreed to start using her CPAP again and will work on weight loss. Keerstin agrees to follow up with our clinic in 2 weeks.  Depression Screen Kim Barnes had a moderately positive depression screening. Depression is commonly associated with obesity and often results in emotional eating behaviors. We will monitor this closely and work on CBT to help improve the non-hunger eating patterns. Referral to Psychology may be required if no improvement is seen as she continues in our clinic.  Obesity Taleyah is currently in the action stage of change and her goal is to continue with weight loss efforts. I recommend Caryssa begin the structured treatment plan as follows:  She has agreed to follow the Category 2 plan Tayler has been instructed to eventually work up to a goal of 150 minutes of combined cardio and strengthening exercise per week for weight loss and overall health benefits. We discussed the following Behavioral Modification Strategies today: increasing lean protein intake, decreasing simple carbohydrates  and work on meal planning and easy cooking plans   She was informed of the importance of frequent follow up visits to maximize her success with intensive lifestyle modifications  for her multiple health conditions. She was informed we would discuss her lab results at her next visit unless there is a critical issue that needs to be addressed sooner. Charee agreed  to keep her next visit at the agreed upon time to discuss these results.    OBESITY BEHAVIORAL INTERVENTION VISIT  Today's visit was # 1 out of 22.  Starting weight: 378 lbs Starting date: 02/26/18 Today's weight : 378 lbs Today's date: 02/26/2018 Total lbs lost to date: 0 (Patients must lose 7 lbs in the first 6 months to continue with counseling)   ASK: We discussed the diagnosis of obesity with Olivette L Schraeder today and Almas agreed to give us permission to discuss obesity behavioral modification therapy today.  ASSESS: Delbert has the diagnosis of obesity and her BMI today is 61.04 Ciin is in the action stage of change   ADVISE: Vera was educated on the multiple health risks of obesity as well as the benefit of weight loss to improve her health. She was advised of the need for long term treatment and the importance of lifestyle modifications.  AGREE: Multiple dietary modification options and treatment options were discussed and  Corneshia agreed to the above obesity treatment plan.   I, [place] CraneJoanne Murray, am acting as transcriptionist for  Quillian Quincearen Beasley, MD   I have reviewed the above documentation for accuracy and completeness, and I agree with the above. -Quillian Quincearen Beasley, MD

## 2018-02-27 LAB — COMPREHENSIVE METABOLIC PANEL
A/G RATIO: 1.4 (ref 1.2–2.2)
ALBUMIN: 4.4 g/dL (ref 3.5–5.5)
ALK PHOS: 69 IU/L (ref 39–117)
ALT: 18 IU/L (ref 0–32)
AST: 20 IU/L (ref 0–40)
BILIRUBIN TOTAL: 0.5 mg/dL (ref 0.0–1.2)
BUN / CREAT RATIO: 9 (ref 9–23)
BUN: 7 mg/dL (ref 6–20)
CHLORIDE: 103 mmol/L (ref 96–106)
CO2: 26 mmol/L (ref 20–29)
Calcium: 9.2 mg/dL (ref 8.7–10.2)
Creatinine, Ser: 0.78 mg/dL (ref 0.57–1.00)
GFR calc Af Amer: 111 mL/min/{1.73_m2} (ref >59)
GFR calc non Af Amer: 96 mL/min/{1.73_m2} (ref >59)
GLOBULIN, TOTAL: 3.2 g/dL (ref 1.5–4.5)
Glucose: 87 mg/dL (ref 65–99)
POTASSIUM: 3.9 mmol/L (ref 3.5–5.2)
SODIUM: 143 mmol/L (ref 134–144)
Total Protein: 7.6 g/dL (ref 6.0–8.5)

## 2018-02-27 LAB — CBC WITH DIFFERENTIAL
Basophils Absolute: 0 10*3/uL (ref 0.0–0.2)
Basos: 0 %
EOS (ABSOLUTE): 0.1 10*3/uL (ref 0.0–0.4)
EOS: 1 %
HEMATOCRIT: 42.2 % (ref 34.0–46.6)
HEMOGLOBIN: 13.6 g/dL (ref 11.1–15.9)
IMMATURE GRANULOCYTES: 0 %
Immature Grans (Abs): 0 10*3/uL (ref 0.0–0.1)
LYMPHS ABS: 2.1 10*3/uL (ref 0.7–3.1)
Lymphs: 39 %
MCH: 28.7 pg (ref 26.6–33.0)
MCHC: 32.2 g/dL (ref 31.5–35.7)
MCV: 89 fL (ref 79–97)
Monocytes Absolute: 0.2 10*3/uL (ref 0.1–0.9)
Monocytes: 4 %
NEUTROS ABS: 3 10*3/uL (ref 1.4–7.0)
Neutrophils: 56 %
RBC: 4.74 x10E6/uL (ref 3.77–5.28)
RDW: 14.7 % (ref 12.3–15.4)
WBC: 5.4 10*3/uL (ref 3.4–10.8)

## 2018-02-27 LAB — LIPID PANEL WITH LDL/HDL RATIO
CHOLESTEROL TOTAL: 216 mg/dL — AB (ref 100–199)
HDL: 47 mg/dL (ref >39)
LDL CALC: 152 mg/dL — AB (ref 0–99)
LDL/HDL RATIO: 3.2 ratio (ref 0.0–3.2)
Triglycerides: 85 mg/dL (ref 0–149)
VLDL CHOLESTEROL CAL: 17 mg/dL (ref 5–40)

## 2018-02-27 LAB — HEMOGLOBIN A1C
Est. average glucose Bld gHb Est-mCnc: 120 mg/dL
HEMOGLOBIN A1C: 5.8 % — AB (ref 4.8–5.6)

## 2018-02-27 LAB — T3: T3, Total: 122 ng/dL (ref 71–180)

## 2018-02-27 LAB — VITAMIN D 25 HYDROXY (VIT D DEFICIENCY, FRACTURES): Vit D, 25-Hydroxy: 11.1 ng/mL — ABNORMAL LOW (ref 30.0–100.0)

## 2018-02-27 LAB — TSH: TSH: 2.54 u[IU]/mL (ref 0.450–4.500)

## 2018-02-27 LAB — INSULIN, RANDOM: INSULIN: 26.4 u[IU]/mL — ABNORMAL HIGH (ref 2.6–24.9)

## 2018-02-27 LAB — FOLATE: FOLATE: 6.6 ng/mL (ref >3.0)

## 2018-02-27 LAB — VITAMIN B12: VITAMIN B 12: 397 pg/mL (ref 232–1245)

## 2018-02-27 LAB — T4, FREE: FREE T4: 0.87 ng/dL (ref 0.82–1.77)

## 2018-03-02 ENCOUNTER — Other Ambulatory Visit: Payer: Self-pay | Admitting: Internal Medicine

## 2018-03-11 ENCOUNTER — Ambulatory Visit (INDEPENDENT_AMBULATORY_CARE_PROVIDER_SITE_OTHER): Payer: 59 | Admitting: Family Medicine

## 2018-03-11 VITALS — BP 158/113 | HR 61 | Temp 97.5°F | Ht 66.0 in | Wt 367.0 lb

## 2018-03-11 DIAGNOSIS — E559 Vitamin D deficiency, unspecified: Secondary | ICD-10-CM | POA: Diagnosis not present

## 2018-03-11 DIAGNOSIS — Z6841 Body Mass Index (BMI) 40.0 and over, adult: Secondary | ICD-10-CM | POA: Diagnosis not present

## 2018-03-11 DIAGNOSIS — Z9189 Other specified personal risk factors, not elsewhere classified: Secondary | ICD-10-CM | POA: Diagnosis not present

## 2018-03-11 DIAGNOSIS — R7303 Prediabetes: Secondary | ICD-10-CM

## 2018-03-11 MED ORDER — METFORMIN HCL 500 MG PO TABS: 500.0000 mg | ORAL_TABLET | Freq: Every day | ORAL | 0 refills | Status: DC

## 2018-03-11 MED ORDER — VITAMIN D (ERGOCALCIFEROL) 1.25 MG (50000 UNIT) PO CAPS: 50000.0000 [IU] | ORAL_CAPSULE | ORAL | 0 refills | Status: DC

## 2018-03-12 NOTE — Progress Notes (Signed)
Office: [phone]  /  Fax: [phone]   HPI:   Chief Complaint: OBESITY Kim Barnes is here to discuss her progress with her obesity treatment plan. She is on the Category 2 plan and is following her eating plan approximately 90 % of the time. She states she is exercising 0 minutes 0 times per week. [name] did well with weight loss. She is getting bored with lunch, but she did well with breakfast and dinner. Hunger is controlled. [name] does note some nighttime cravings. Her weight is (!) 367 lb (166.5 kg) today and has had a weight loss of 11 pounds over a period of 2 weeks since her last visit. She has lost 11 lbs since starting treatment with us.  Vitamin D deficiency (new) [name] has a new diagnosis of vitamin D deficiency. Her level is very low. She is not currently taking vit D and admits fatigue but denies nausea, vomiting or muscle weakness.  Pre-Diabetes [name] has a diagnosis of pre-diabetes based on her elevated Hgb A1c and fasting insulin. She admits polyphagia. She has a history of PCOS and was given  Metformin in the past, but she is not taking metformin any longer. [name] was informed this puts her at greater risk of developing diabetes. She continues to work on diet and exercise to decrease risk of diabetes. She denies nausea or hypoglycemia.  At risk for diabetes [name] is at higher than average risk for developing diabetes due to her obesity and pre-diabetes. She currently denies polyuria or polydipsia.  ALLERGIES: No Known Allergies  MEDICATIONS: Current Outpatient Medications on File Prior to Visit  Medication Sig Dispense Refill  . ibuprofen (ADVIL,MOTRIN) 200 MG tablet Take 1,200 mg by mouth daily as needed for moderate pain (tooth pain).    . Nebivolol HCl (BYSTOLIC) 20 MG TABS Take 1 tablet (20 mg total) by mouth daily. 30 tablet 11  . triamterene-hydrochlorothiazide (MAXZIDE-25) 37.5-25 MG tablet TAKE 1 TABLET BY MOUTH DAILY. 30 tablet 11   No current  facility-administered medications on file prior to visit.     PAST MEDICAL HISTORY: Past Medical History:  Diagnosis Date  . Anxiety   . Asthma    prn inhaler  . Back pain   . Bell's palsy   . Carpal tunnel syndrome of left wrist 01/2013  . Chest pain   . Cold hands and feet   . Cough   . Depression   . Dry mouth   . Dry skin   . Fatigue   . GERD (gastroesophageal reflux disease)    occasional; no current med.  [name]. History of brain tumor 1996   no neurologic deficits  . HTN (hypertension)    under control with med., has been on med. x 5 yr.  . Joint pain   . Obesity   . Obstructive sleep apnea   . PCOS (polycystic ovarian syndrome)   . Polycystic ovarian syndrome    no current med.; irregular periods  . Shortness of breath   . Stress   . Swelling of extremity   . Wheezing     PAST SURGICAL HISTORY: Past Surgical History:  Procedure Laterality Date  . BRAIN SURGERY  1996   exc. tumor  . CARPAL TUNNEL RELEASE Left 02/10/2013   Procedure: CARPAL TUNNEL RELEASE;  Surgeon: Nicki ReaperGary R Kuzma, MD;  Location: [facility];  Service: Orthopedics;  Laterality: Left;  ANESTHESIA: IV REGIONAL FAB  . HYSTEROSCOPY W/D&C  02/21/2006  . HYSTEROSCOPY W/D&C  12/17/2011   Procedure:  DILATATION AND CURETTAGE /HYSTEROSCOPY;  Surgeon: Meriel Pica, MD;  Location: [facility];  Service: Gynecology;  Laterality: N/A;  . LEEP  01/07/2004  . TONSILLECTOMY  09/21/2002    SOCIAL HISTORY: Social History   Tobacco Use  . Smoking status: Never Smoker  . Smokeless tobacco: Never Used  Substance Use Topics  . Alcohol use: Yes    Comment: rare; socially  . Drug use: No    FAMILY HISTORY: Family History  Problem Relation Age of Onset  . Hypertension Other   . Lung cancer Father        Smoker  . Cancer Father        lung/ smoker  . Heart disease Father   . Alcoholism Father   . Breast cancer Maternal Grandmother   . Cancer Maternal Grandmother        breast  . Emphysema  Paternal Grandmother   . Asthma Mother   . Hyperlipidemia Mother   . Hypertension Mother   . Sleep apnea Mother   . Asthma Maternal Aunt     ROS: Review of Systems  Constitutional: Positive for malaise/fatigue and weight loss.  Gastrointestinal: Negative for nausea and vomiting.  Genitourinary: Negative for frequency.  Musculoskeletal:       Negative for muscle weakness  Endo/Heme/Allergies: Negative for polydipsia.       Positive for cravings Positive for polyphagia Negative for hypoglycemia     PHYSICAL EXAM: Blood pressure (!) 158/113, pulse 61, temperature (!) 97.5 F (36.4 C), temperature source Oral, height 5\' 6"  (1.676 m), weight (!) 367 lb (166.5 kg), SpO2 99 %. Body mass index is 59.24 kg/m. Physical Exam  Constitutional: She is oriented to person, place, and time. She appears well-developed and well-nourished.  Cardiovascular: Normal rate.  Pulmonary/Chest: Effort normal.  Musculoskeletal: Normal range of motion.  Neurological: She is oriented to person, place, and time.  Skin: Skin is warm and dry.  Psychiatric: She has a normal mood and affect. Her behavior is normal.  Vitals reviewed.   RECENT LABS AND TESTS: BMET    Component Value Date/Time   NA 143 02/26/2018 0936   K 3.9 02/26/2018 0936   CL 103 02/26/2018 0936   CO2 26 02/26/2018 0936   GLUCOSE 87 02/26/2018 0936   GLUCOSE 103 (H) 12/31/2017 0926   GLUCOSE 83 10/08/2006 1418   BUN 7 02/26/2018 0936   CREATININE 0.78 02/26/2018 0936   CALCIUM 9.2 02/26/2018 0936   GFRNONAA 96 02/26/2018 0936   GFRAA 111 02/26/2018 0936   Lab Results  Component Value Date   HGBA1C 5.8 (H) 02/26/2018   HGBA1C 5.3 03/31/2015   HGBA1C 6.0 04/20/2011   HGBA1C 5.9 05/24/2010   HGBA1C 5.7 10/05/2008   Lab Results  Component Value Date   INSULIN 26.4 (H) 02/26/2018   CBC    Component Value Date/Time   WBC 5.4 02/26/2018 0936   WBC 5.7 12/31/2017 0926   RBC 4.74 02/26/2018 0936   RBC 4.82 12/31/2017  0926   HGB 13.6 02/26/2018 0936   HCT 42.2 02/26/2018 0936   PLT 281.0 12/31/2017 0926   MCV 89 02/26/2018 0936   MCH 28.7 02/26/2018 0936   MCH 29.3 02/04/2017 1851   MCHC 32.2 02/26/2018 0936   MCHC 33.8 12/31/2017 0926   RDW 14.7 02/26/2018 0936   LYMPHSABS 2.1 02/26/2018 0936   MONOABS 0.3 12/31/2017 0926   EOSABS 0.1 02/26/2018 0936   BASOSABS 0.0 02/26/2018 0936   Iron/TIBC/Ferritin/ %Sat No results found for:  IRON, TIBC, FERRITIN, IRONPCTSAT Lipid Panel     Component Value Date/Time   CHOL 216 (H) 02/26/2018 0936   TRIG 85 02/26/2018 0936   HDL 47 02/26/2018 0936   CHOLHDL 4 12/31/2017 0926   VLDL 17.2 12/31/2017 0926   LDLCALC 152 (H) 02/26/2018 0936   LDLDIRECT 149.4 05/24/2010 0904   Hepatic Function Panel     Component Value Date/Time   PROT 7.6 02/26/2018 0936   ALBUMIN 4.4 02/26/2018 0936   AST 20 02/26/2018 0936   ALT 18 02/26/2018 0936   ALKPHOS 69 02/26/2018 0936   BILITOT 0.5 02/26/2018 0936   BILIDIR 0.0 12/31/2017 0926      Component Value Date/Time   TSH 2.540 02/26/2018 0936   TSH 1.78 12/31/2017 0926   TSH 1.12 10/01/2013 1056   Results for Pascual, Kameshia L (MRN [id number]) as of 03/12/2018 08:46  Ref. Range 02/26/2018 09:36  Vitamin D, 25-Hydroxy Latest Ref Range: 30.0 - 100.0 ng/mL 11.1 (L)   ASSESSMENT AND PLAN: Vitamin D deficiency - Plan: Vitamin D, Ergocalciferol, (DRISDOL) 50000 units CAPS capsule  Prediabetes - Plan: metFORMIN (GLUCOPHAGE) 500 MG tablet  At risk for diabetes mellitus  Class 3 severe obesity with serious comorbidity and body mass index (BMI) of 50.0 to 59.9 in adult, unspecified obesity type (HCC)  PLAN:  Vitamin D Deficiency Allen was informed that low vitamin D levels contributes to fatigue and are associated with obesity, breast, and colon cancer. She agrees to start to take prescription Vit D @50 ,000 IU every week #4 with no refills and will follow up for routine testing of vitamin D, at least 2-3 times per  year. She was informed of the risk of over-replacement of vitamin D and agrees to not increase her dose unless she discusses this with [name] first. Juneau agrees to follow up with our clinic in 2 weeks.  Pre-Diabetes Taelyr will continue to work on weight loss, exercise, and decreasing simple carbohydrates in her diet to help decrease the risk of diabetes. We dicussed metformin including benefits and risks. She was informed that eating too many simple carbohydrates or too many calories at one sitting increases the likelihood of GI side effects. Arwyn agrees to start metformin 500 mg qAM #30 with no refills and follow up with [name] as directed to monitor her progress.  Diabetes risk counseling Lamiracle was given extended (30 minutes) diabetes prevention counseling today. She is 42 y.o. female and has risk factors for diabetes including obesity and pre-diabetes. We discussed intensive lifestyle modifications today with an emphasis on weight loss as well as increasing exercise and decreasing simple carbohydrates in her diet.  Obesity Kacee is currently in the action stage of change. As such, her goal is to continue with weight loss efforts She has agreed to follow the Category 2 plan Chalise has been instructed to work up to a goal of 150 minutes of combined cardio and strengthening exercise per week for weight loss and overall health benefits. We discussed the following Behavioral Modification Strategies today: better snacking choices, increasing lean protein intake and work on meal planning and easy cooking plans  Tanesha has agreed to follow up with our clinic in 2 weeks. She was informed of the importance of frequent follow up visits to maximize her success with intensive lifestyle modifications for her multiple health conditions.   OBESITY BEHAVIORAL INTERVENTION VISIT  Today's visit was # 2 out of 22.  Starting weight: 378 lbs Starting date: 02/26/18 Today's weight : 367 lbs Today's  date:  03/11/2018 Total lbs lost to date: 11 (Patients must lose 7 lbs in the first 6 months to continue with counseling)   ASK: We discussed the diagnosis of obesity with Mariposa L Pester today and Deshawnda agreed to give [name] permission to discuss obesity behavioral modification therapy today.  ASSESS: Irasema has the diagnosis of obesity and her BMI today is 59.26 Catherina is in the action stage of change   ADVISE: Kahlia was educated on the multiple health risks of obesity as well as the benefit of weight loss to improve her health. She was advised of the need for long term treatment and the importance of lifestyle modifications.  AGREE: Multiple dietary modification options and treatment options were discussed and  Krystl agreed to the above obesity treatment plan.  I, [place] Crane, am acting as transcriptionist for Quillian Quince, MD  I have reviewed the above documentation for accuracy and completeness, and I agree with the above. -Quillian Quince, MD

## 2018-03-24 ENCOUNTER — Ambulatory Visit (INDEPENDENT_AMBULATORY_CARE_PROVIDER_SITE_OTHER): Payer: 59 | Admitting: Family Medicine

## 2018-03-24 VITALS — BP 148/98 | HR 57 | Temp 98.3°F | Ht 66.0 in | Wt 368.0 lb

## 2018-03-24 DIAGNOSIS — Z6841 Body Mass Index (BMI) 40.0 and over, adult: Secondary | ICD-10-CM

## 2018-03-24 DIAGNOSIS — R7303 Prediabetes: Secondary | ICD-10-CM

## 2018-03-24 DIAGNOSIS — I10 Essential (primary) hypertension: Secondary | ICD-10-CM | POA: Diagnosis not present

## 2018-03-24 NOTE — Progress Notes (Signed)
Office: [phone]  /  Fax: [phone]   HPI:   Chief Complaint: OBESITY Kim Barnes is here to discuss her progress with her obesity treatment plan. She is on the Category 2 plan and is following her eating plan approximately 50 % of the time. She states she is exercising 0 minutes 0 times per week. Kim Barnes had increase celebration eating and increase traveling in the last 2 weeks and hasn't been able to follow Category 2 plan closely. She is going to a wedding this weekend and worried about further weight gain.  Her weight is (!) 368 lb (166.9 kg) today and has gained 1 pound since her last visit. She has lost 10 lbs since starting treatment with [name].  Pre-Diabetes Kim Barnes has a diagnosis of pre-diabetes based on her elevated Hgb A1c and was informed this puts her at greater risk of developing diabetes. Kim Barnes metformin  And noted some GI upset. She is not following her plan closely and simple carbohydrates have increased. She continues to work on diet and exercise to decrease risk of diabetes. She denies hypoglycemia.  Hypertension Kim Barnes is a 42 y.o. female with hypertension. Kim Barnes's blood pressure is still elevated but has been improving since working on weight loss. She denies chest pain or headache. She is working weight loss to help control her blood pressure with the goal of decreasing her risk of heart attack and stroke. Kim Barnes's blood pressure is not currently controlled.  ALLERGIES: No Known Allergies  MEDICATIONS: Current Outpatient Medications on File Prior to Visit  Medication Sig Dispense Refill  . ibuprofen (ADVIL,MOTRIN) 200 MG tablet Take 1,200 mg by mouth daily as needed for moderate pain (tooth pain).    . metFORMIN (GLUCOPHAGE) 500 MG tablet Take 1 tablet (500 mg total) by mouth daily with breakfast. 30 tablet 0  . Nebivolol HCl (BYSTOLIC) 20 MG TABS Take 1 tablet (20 mg total) by mouth daily. 30 tablet 11  . triamterene-hydrochlorothiazide (MAXZIDE-25)  37.5-25 MG tablet TAKE 1 TABLET BY MOUTH DAILY. 30 tablet 11  . Vitamin D, Ergocalciferol, (DRISDOL) 50000 units CAPS capsule Take 1 capsule (50,000 Units total) by mouth every 7 (seven) days. 4 capsule 0   No current facility-administered medications on file prior to visit.     PAST MEDICAL HISTORY: Past Medical History:  Diagnosis Date  . Anxiety   . Asthma    prn inhaler  . Back pain   . Bell's palsy   . Carpal tunnel syndrome of left wrist 01/2013  . Chest pain   . Cold hands and feet   . Cough   . Depression   . Dry mouth   . Dry skin   . Fatigue   . GERD (gastroesophageal reflux disease)    occasional; no current med.  [name] History of brain tumor 1996   no neurologic deficits  . HTN (hypertension)    under control with med., has been on med. x 5 yr.  . Joint pain   . Obesity   . Obstructive sleep apnea   . PCOS (polycystic ovarian syndrome)   . Polycystic ovarian syndrome    no current med.; irregular periods  . Shortness of breath   . Stress   . Swelling of extremity   . Wheezing     PAST SURGICAL HISTORY: Past Surgical History:  Procedure Laterality Date  . BRAIN SURGERY  1996   exc. tumor  . CARPAL TUNNEL RELEASE Left 02/10/2013   Procedure: CARPAL TUNNEL RELEASE;  Surgeon: Jillyn Hidden  Georges Lynch, MD;  Location: [facility];  Service: Orthopedics;  Laterality: Left;  ANESTHESIA: IV REGIONAL FAB  . HYSTEROSCOPY W/D&C  02/21/2006  . HYSTEROSCOPY W/D&C  12/17/2011   Procedure: DILATATION AND CURETTAGE /HYSTEROSCOPY;  Surgeon: Meriel Pica, MD;  Location: [facility];  Service: Gynecology;  Laterality: N/A;  . LEEP  01/07/2004  . TONSILLECTOMY  09/21/2002    SOCIAL HISTORY: Social History   Tobacco Use  . Smoking status: Never Smoker  . Smokeless tobacco: Never Used  Substance Use Topics  . Alcohol use: Yes    Comment: rare; socially  . Drug use: No    FAMILY HISTORY: Family History  Problem Relation Age of Onset  . Hypertension Other   . Lung  cancer Father        Smoker  . Cancer Father        lung/ smoker  . Heart disease Father   . Alcoholism Father   . Breast cancer Maternal Grandmother   . Cancer Maternal Grandmother        breast  . Emphysema Paternal Grandmother   . Asthma Mother   . Hyperlipidemia Mother   . Hypertension Mother   . Sleep apnea Mother   . Asthma Maternal Aunt     ROS: Review of Systems  Constitutional: Negative for weight loss.  Cardiovascular: Negative for chest pain.  Neurological: Negative for headaches.  Endo/Heme/Allergies:       Negative hypoglycemia    PHYSICAL EXAM: Blood pressure (!) 148/98, pulse (!) 57, temperature 98.3 F (36.8 C), temperature source Oral, height 5\' 6"  (1.676 m), weight (!) 368 lb (166.9 kg), SpO2 97 %. Body mass index is 59.4 kg/m. Physical Exam  Constitutional: She is oriented to person, place, and time. She appears well-developed and well-nourished.  Cardiovascular: Normal rate.  Pulmonary/Chest: Effort normal.  Musculoskeletal: Normal range of motion.  Neurological: She is oriented to person, place, and time.  Skin: Skin is warm and dry.  Psychiatric: She has a normal mood and affect. Her behavior is normal.  Vitals reviewed.   RECENT LABS AND TESTS: BMET    Component Value Date/Time   NA 143 02/26/2018 0936   K 3.9 02/26/2018 0936   CL 103 02/26/2018 0936   CO2 26 02/26/2018 0936   GLUCOSE 87 02/26/2018 0936   GLUCOSE 103 (H) 12/31/2017 0926   GLUCOSE 83 10/08/2006 1418   BUN 7 02/26/2018 0936   CREATININE 0.78 02/26/2018 0936   CALCIUM 9.2 02/26/2018 0936   GFRNONAA 96 02/26/2018 0936   GFRAA 111 02/26/2018 0936   Lab Results  Component Value Date   HGBA1C 5.8 (H) 02/26/2018   HGBA1C 5.3 03/31/2015   HGBA1C 6.0 04/20/2011   HGBA1C 5.9 05/24/2010   HGBA1C 5.7 10/05/2008   Lab Results  Component Value Date   INSULIN 26.4 (H) 02/26/2018   CBC    Component Value Date/Time   WBC 5.4 02/26/2018 0936   WBC 5.7 12/31/2017 0926    RBC 4.74 02/26/2018 0936   RBC 4.82 12/31/2017 0926   HGB 13.6 02/26/2018 0936   HCT 42.2 02/26/2018 0936   PLT 281.0 12/31/2017 0926   MCV 89 02/26/2018 0936   MCH 28.7 02/26/2018 0936   MCH 29.3 02/04/2017 1851   MCHC 32.2 02/26/2018 0936   MCHC 33.8 12/31/2017 0926   RDW 14.7 02/26/2018 0936   LYMPHSABS 2.1 02/26/2018 0936   MONOABS 0.3 12/31/2017 0926   EOSABS 0.1 02/26/2018 0936   BASOSABS 0.0 02/26/2018  9562   Iron/TIBC/Ferritin/ %Sat No results found for: IRON, TIBC, FERRITIN, IRONPCTSAT Lipid Panel     Component Value Date/Time   CHOL 216 (H) 02/26/2018 0936   TRIG 85 02/26/2018 0936   HDL 47 02/26/2018 0936   CHOLHDL 4 12/31/2017 0926   VLDL 17.2 12/31/2017 0926   LDLCALC 152 (H) 02/26/2018 0936   LDLDIRECT 149.4 05/24/2010 0904   Hepatic Function Panel     Component Value Date/Time   PROT 7.6 02/26/2018 0936   ALBUMIN 4.4 02/26/2018 0936   AST 20 02/26/2018 0936   ALT 18 02/26/2018 0936   ALKPHOS 69 02/26/2018 0936   BILITOT 0.5 02/26/2018 0936   BILIDIR 0.0 12/31/2017 0926      Component Value Date/Time   TSH 2.540 02/26/2018 0936   TSH 1.78 12/31/2017 0926   TSH 1.12 10/01/2013 1056    ASSESSMENT AND PLAN: Prediabetes  Essential hypertension  Class 3 severe obesity with serious comorbidity and body mass index (BMI) of 50.0 to 59.9 in adult, unspecified obesity type (HCC)  PLAN:  Pre-Diabetes Kim Barnes will continue to work on weight loss, exercise, and decreasing simple carbohydrates in her diet to help decrease the risk of diabetes. We dicussed metformin including benefits and risks. She was informed that eating too many simple carbohydrates or too many calories at one sitting increases the likelihood of GI side effects. Kim Barnes agrees to continue taking metformin and work on decreasing simple carbohydrates which should help with decreasing GI upset. Kim Barnes agrees to follow up with our clinic in 2 to 3 weeks as directed to monitor her  progress.  Hypertension We discussed sodium restriction, working on healthy weight loss, and a regular exercise program as the means to achieve improved blood pressure control. Kim Barnes agreed with this plan and agreed to follow up as directed. We will continue to monitor her blood pressure as well as her progress with the above lifestyle modifications. She will continue her medications as prescribed, diet, and will watch for signs of hypotension as she continues her lifestyle modifications. Kim Barnes agrees to follow up with our clinic in 2 to 3 weeks and we will recheck blood pressure at that time.  We spent > than 50% of the 30 minute visit on the counseling as documented in the note.  Obesity Kim Barnes is currently in the action stage of change. As such, her goal is to continue with weight loss efforts She has agreed to follow the Category 2 plan Kim Barnes has been instructed to work up to a goal of 150 minutes of combined cardio and strengthening exercise per week for weight loss and overall health benefits. We discussed the following Behavioral Modification Strategies today: increasing lean protein intake, decreasing simple carbohydrates, work on meal planning and easy cooking plans, and travel eating strategies   Kim Barnes has agreed to follow up with our clinic in 2 to 3 weeks. She was informed of the importance of frequent follow up visits to maximize her success with intensive lifestyle modifications for her multiple health conditions.   OBESITY BEHAVIORAL INTERVENTION VISIT  Today's visit was # 3 out of 22.  Starting weight: 378 lbs Starting date: 02/26/18 Today's weight : 368 lbs Today's date: 03/24/2018 Total lbs lost to date: 10 (Patients must lose 7 lbs in the first 6 months to continue with counseling)   ASK: We discussed the diagnosis of obesity with Kim Barnes today and Kim Barnes agreed to give [name] permission to discuss obesity behavioral modification therapy  today.  ASSESS:  Kim Barnes has the diagnosis of obesity and her BMI today is 59.43 Kim Barnes is in the action stage of change   ADVISE: Kim Barnes was educated on the multiple health risks of obesity as well as the benefit of weight loss to improve her health. She was advised of the need for long term treatment and the importance of lifestyle modifications.  AGREE: Multiple dietary modification options and treatment options were discussed and  Kim Barnes agreed to the above obesity treatment plan.  I, Burt KnackSharon Martin, am acting as transcriptionist for Quillian Quincearen Beasley, MD  I have reviewed the above documentation for accuracy and completeness, and I agree with the above. -Quillian Quincearen Beasley, MD

## 2018-04-09 ENCOUNTER — Ambulatory Visit (INDEPENDENT_AMBULATORY_CARE_PROVIDER_SITE_OTHER): Payer: 59 | Admitting: Family Medicine

## 2018-04-09 VITALS — BP 149/93 | HR 68 | Temp 98.2°F | Ht 66.0 in | Wt 362.0 lb

## 2018-04-09 DIAGNOSIS — Z9189 Other specified personal risk factors, not elsewhere classified: Secondary | ICD-10-CM

## 2018-04-09 DIAGNOSIS — E559 Vitamin D deficiency, unspecified: Secondary | ICD-10-CM

## 2018-04-09 DIAGNOSIS — Z6841 Body Mass Index (BMI) 40.0 and over, adult: Secondary | ICD-10-CM | POA: Diagnosis not present

## 2018-04-09 DIAGNOSIS — R7303 Prediabetes: Secondary | ICD-10-CM

## 2018-04-09 DIAGNOSIS — I10 Essential (primary) hypertension: Secondary | ICD-10-CM

## 2018-04-09 MED ORDER — VITAMIN D (ERGOCALCIFEROL) 1.25 MG (50000 UNIT) PO CAPS: 50000.0000 [IU] | ORAL_CAPSULE | ORAL | 0 refills | Status: DC

## 2018-04-09 MED ORDER — METFORMIN HCL 500 MG PO TABS: 500.0000 mg | ORAL_TABLET | Freq: Every day | ORAL | 0 refills | Status: DC

## 2018-04-14 NOTE — Progress Notes (Signed)
Office: [phone]  /  Fax: [phone]   HPI:   Chief Complaint: OBESITY Kim Barnes is here to discuss her progress with her obesity treatment plan. She is on the Category 2 plan and is following her eating plan approximately 70 % of the time. She states she is exercising 0 minutes 0 times per week. Kim Barnes continues to do well with weight loss even while traveling and on vacation. She is working to plan for challenges and eating all her food.  Her weight is (!) 362 lb (164.2 kg) today and has had a weight loss of 6 pounds over a period of 2 weeks since her last visit. She has lost 16 lbs since starting treatment with [name].  Hypertension Kim Barnes is a 42 y.o. female with hypertension. Kim Barnes's blood pressure still elevated on Maxzide and Bystolic. She takes both in the morning. She denies chest pain or headache. She is working weight loss to help control her blood pressure with the goal of decreasing her risk of heart attack and stroke. Kim Barnes's blood pressure is not currently controlled.  Pre-Diabetes Kim Barnes has a diagnosis of pre-diabetes based on her elevated Hgb A1c and was informed this puts her at greater risk of developing diabetes. She is on metformin and she denies hypoglycemia. She notes mild nausea but improved. She continues to work on diet and exercise to decrease risk of diabetes.   At risk for diabetes Kim Barnes is at higher than average risk for developing diabetes due to her obesity and pre-diabetes. She currently denies polyuria or polydipsia.  Vitamin D Deficiency Kim Barnes has a diagnosis of vitamin D deficiency. She is stable on prescription Vit D, not yet at goal. She notes fatigue is improving and denies nausea, vomiting or muscle weakness.  ALLERGIES: No Known Allergies  MEDICATIONS: Current Outpatient Medications on File Prior to Visit  Medication Sig Dispense Refill  . ibuprofen (ADVIL,MOTRIN) 200 MG tablet Take 1,200 mg by mouth daily as needed for moderate  pain (tooth pain).    . Nebivolol HCl (BYSTOLIC) 20 MG TABS Take 1 tablet (20 mg total) by mouth daily. 30 tablet 11  . triamterene-hydrochlorothiazide (MAXZIDE-25) 37.5-25 MG tablet TAKE 1 TABLET BY MOUTH DAILY. 30 tablet 11   No current facility-administered medications on file prior to visit.     PAST MEDICAL HISTORY: Past Medical History:  Diagnosis Date  . Anxiety   . Asthma    prn inhaler  . Back pain   . Bell's palsy   . Carpal tunnel syndrome of left wrist 01/2013  . Chest pain   . Cold hands and feet   . Cough   . Depression   . Dry mouth   . Dry skin   . Fatigue   . GERD (gastroesophageal reflux disease)    occasional; no current med.  [name] History of brain tumor 1996   no neurologic deficits  . HTN (hypertension)    under control with med., has been on med. x 5 yr.  . Joint pain   . Obesity   . Obstructive sleep apnea   . PCOS (polycystic ovarian syndrome)   . Polycystic ovarian syndrome    no current med.; irregular periods  . Shortness of breath   . Stress   . Swelling of extremity   . Wheezing     PAST SURGICAL HISTORY: Past Surgical History:  Procedure Laterality Date  . BRAIN SURGERY  1996   exc. tumor  . CARPAL TUNNEL RELEASE Left 02/10/2013  Procedure: CARPAL TUNNEL RELEASE;  Surgeon: Nicki Reaper, MD;  Location: [facility];  Service: Orthopedics;  Laterality: Left;  ANESTHESIA: IV REGIONAL FAB  . HYSTEROSCOPY W/D&C  02/21/2006  . HYSTEROSCOPY W/D&C  12/17/2011   Procedure: DILATATION AND CURETTAGE /HYSTEROSCOPY;  Surgeon: Meriel Pica, MD;  Location: [facility];  Service: Gynecology;  Laterality: N/A;  . LEEP  01/07/2004  . TONSILLECTOMY  09/21/2002    SOCIAL HISTORY: Social History   Tobacco Use  . Smoking status: Never Smoker  . Smokeless tobacco: Never Used  Substance Use Topics  . Alcohol use: Yes    Comment: rare; socially  . Drug use: No    FAMILY HISTORY: Family History  Problem Relation Age of Onset  .  Hypertension Other   . Lung cancer Father        Smoker  . Cancer Father        lung/ smoker  . Heart disease Father   . Alcoholism Father   . Breast cancer Maternal Grandmother   . Cancer Maternal Grandmother        breast  . Emphysema Paternal Grandmother   . Asthma Mother   . Hyperlipidemia Mother   . Hypertension Mother   . Sleep apnea Mother   . Asthma Maternal Aunt     ROS: Review of Systems  Constitutional: Positive for malaise/fatigue and weight loss.  Cardiovascular: Negative for chest pain.  Gastrointestinal: Negative for nausea and vomiting.  Genitourinary: Negative for frequency.  Musculoskeletal:       Negative muscle weakness  Neurological: Negative for headaches.  Endo/Heme/Allergies: Negative for polydipsia.       Negative hypoglycemia    PHYSICAL EXAM: Blood pressure (!) 149/93, pulse 68, temperature 98.2 F (36.8 C), temperature source Oral, height  (1.676 m), weight (!) 362 lb (164.2 kg), SpO2 99 %. Body mass index is 58.43 kg/m. Physical Exam  Constitutional: She is oriented to person, place, and time. She appears well-developed and well-nourished.  Cardiovascular: Normal rate.  Pulmonary/Chest: Effort normal.  Musculoskeletal: Normal range of motion.  Neurological: She is oriented to person, place, and time.  Skin: Skin is warm and dry.  Psychiatric: She has a normal mood and affect. Her behavior is normal.  Vitals reviewed.   RECENT LABS AND TESTS: BMET    Component Value Date/Time   NA 143 02/26/2018 0936   K 3.9 02/26/2018 0936   CL 103 02/26/2018 0936   CO2 26 02/26/2018 0936   GLUCOSE 87 02/26/2018 0936   GLUCOSE 103 (H) 12/31/2017 0926   GLUCOSE 83 10/08/2006 1418   BUN 7 02/26/2018 0936   CREATININE 0.78 02/26/2018 0936   CALCIUM 9.2 02/26/2018 0936   GFRNONAA 96 02/26/2018 0936   GFRAA 111 02/26/2018 0936   Lab Results  Component Value Date   HGBA1C 5.8 (H) 02/26/2018   HGBA1C 5.3 03/31/2015   HGBA1C 6.0 04/20/2011     HGBA1C 5.9 05/24/2010   HGBA1C 5.7 10/05/2008   Lab Results  Component Value Date   INSULIN 26.4 (H) 02/26/2018   CBC    Component Value Date/Time   WBC 5.4 02/26/2018 0936   WBC 5.7 12/31/2017 0926   RBC 4.74 02/26/2018 0936   RBC 4.82 12/31/2017 0926   HGB 13.6 02/26/2018 0936   HCT 42.2 02/26/2018 0936   PLT 281.0 12/31/2017 0926   MCV 89 02/26/2018 0936   MCH 28.7 02/26/2018 0936   MCH 29.3 02/04/2017 1851   MCHC 32.2 02/26/2018 0936  MCHC 33.8 12/31/2017 0926   RDW 14.7 02/26/2018 0936   LYMPHSABS 2.1 02/26/2018 0936   MONOABS 0.3 12/31/2017 0926   EOSABS 0.1 02/26/2018 0936   BASOSABS 0.0 02/26/2018 0936   Iron/TIBC/Ferritin/ %Sat No results found for: IRON, TIBC, FERRITIN, IRONPCTSAT Lipid Panel     Component Value Date/Time   CHOL 216 (H) 02/26/2018 0936   TRIG 85 02/26/2018 0936   HDL 47 02/26/2018 0936   CHOLHDL 4 12/31/2017 0926   VLDL 17.2 12/31/2017 0926   LDLCALC 152 (H) 02/26/2018 0936   LDLDIRECT 149.4 05/24/2010 0904   Hepatic Function Panel     Component Value Date/Time   PROT 7.6 02/26/2018 0936   ALBUMIN 4.4 02/26/2018 0936   AST 20 02/26/2018 0936   ALT 18 02/26/2018 0936   ALKPHOS 69 02/26/2018 0936   BILITOT 0.5 02/26/2018 0936   BILIDIR 0.0 12/31/2017 0926      Component Value Date/Time   TSH 2.540 02/26/2018 0936   TSH 1.78 12/31/2017 0926   TSH 1.12 10/01/2013 1056  Results for [place], Lenna L (MRN [id number]) as of 04/14/2018 11:03  Ref. Range 02/26/2018 09:36  Vitamin D, 25-Hydroxy Latest Ref Range: 30.0 - 100.0 ng/mL 11.1 (L)    ASSESSMENT AND PLAN: Prediabetes - Plan: metFORMIN (GLUCOPHAGE) 500 MG tablet  Essential hypertension  Vitamin D deficiency - Plan: Vitamin D, Ergocalciferol, (DRISDOL) 50000 units CAPS capsule  At risk for diabetes mellitus  Class 3 severe obesity with serious comorbidity and body mass index (BMI) of 50.0 to 59.9 in adult, unspecified obesity type (HCC)  PLAN:  Hypertension We discussed  sodium restriction, working on healthy weight loss, and a regular exercise program as the means to achieve improved blood pressure control. Kim Barnes agreed with this plan and agreed to follow up as directed. We will continue to monitor her blood pressure as well as her progress with the above lifestyle modifications. Kim Barnes was advised to change her Bystolic to night and Maxzide to morning and we will recheck blood pressure in 2 to 3 weeks. She will watch for signs of hypotension as she continues her lifestyle modifications. Kim Barnes agrees to follow up with our clinic in 2 to 3 weeks.  Pre-Diabetes Kim Barnes will continue to work on weight loss, diet, exercise, and decreasing simple carbohydrates in her diet to help decrease the risk of diabetes. We dicussed metformin including benefits and risks. She was informed that eating too many simple carbohydrates or too many calories at one sitting increases the likelihood of GI side effects. Kim Barnes agrees to continue taking metformin 500 mg q AM #30 and we will refill for 1 month . Kim Barnes agrees to follow up with our clinic in 2 to 3 weeks as directed to monitor her progress.  Diabetes risk counselling Kim Barnes was given extended (15 minutes) diabetes prevention counseling today. She is 41 y.o. female and has risk factors for diabetes including obesity and pre-diabetes. We discussed intensive lifestyle modifications today with an emphasis on weight loss as well as increasing exercise and decreasing simple carbohydrates in her diet.  Vitamin D Deficiency Kim Barnes was informed that low vitamin D levels contributes to fatigue and are associated with obesity, breast, and colon cancer. Kim Barnes agrees to continue taking prescription Vit D ,000 IU every week #4 and we will refill for 1 month. She will follow up for routine testing of vitamin D, at least 2-3 times per year. She was informed of the risk of over-replacement of vitamin D and agrees to not increase  her dose unless  she discusses this with [name] first. Kim Barnes agrees to follow up with our clinic in 2 to 3 weeks.  Obesity Kim Barnes is currently in the action stage of change. As such, her goal is to continue with weight loss efforts She has agreed to follow the Category 2 plan with breakfast options Kim Barnes has been instructed to work up to a goal of 150 minutes of combined cardio and strengthening exercise per week for weight loss and overall health benefits. We discussed the following Behavioral Modification Strategies today: decrease eating out, no skipping meals, and work on meal planning and easy cooking plans   Kim Barnes has agreed to follow up with our clinic in 2 to 3 weeks. She was informed of the importance of frequent follow up visits to maximize her success with intensive lifestyle modifications for her multiple health conditions.   OBESITY BEHAVIORAL INTERVENTION VISIT  Today's visit was # 4 out of 22.  Starting weight: 378 lbs Starting date: 02/26/18 Today's weight : 362 lbs  Today's date: 04/09/2018 Total lbs lost to date: 16 (Patients must lose 7 lbs in the first 6 months to continue with counseling)   ASK: We discussed the diagnosis of obesity with Kim Barnes today and Kim Barnes agreed to give [name] permission to discuss obesity behavioral modification therapy today.  ASSESS: Kim Barnes has the diagnosis of obesity and her BMI today is 58.46 Kim Barnes is in the action stage of change   ADVISE: Kim Barnes was educated on the multiple health risks of obesity as well as the benefit of weight loss to improve her health. She was advised of the need for long term treatment and the importance of lifestyle modifications.  AGREE: Multiple dietary modification options and treatment options were discussed and  Kim Barnes agreed to the above obesity treatment plan.  I, Burt Knack, am acting as transcriptionist for Quillian Quince, MD  I have reviewed the above documentation for accuracy and completeness, and I  agree with the above. -Quillian Quince, MD

## 2018-04-23 ENCOUNTER — Ambulatory Visit (INDEPENDENT_AMBULATORY_CARE_PROVIDER_SITE_OTHER): Payer: 59 | Admitting: Family Medicine

## 2018-04-23 VITALS — BP 147/70 | HR 58 | Temp 98.1°F | Ht 66.0 in | Wt 364.0 lb

## 2018-04-23 DIAGNOSIS — Z6841 Body Mass Index (BMI) 40.0 and over, adult: Secondary | ICD-10-CM

## 2018-04-23 DIAGNOSIS — Z9189 Other specified personal risk factors, not elsewhere classified: Secondary | ICD-10-CM

## 2018-04-23 DIAGNOSIS — I10 Essential (primary) hypertension: Secondary | ICD-10-CM

## 2018-04-23 DIAGNOSIS — E66813 Obesity, class 3: Secondary | ICD-10-CM

## 2018-04-23 DIAGNOSIS — E559 Vitamin D deficiency, unspecified: Secondary | ICD-10-CM | POA: Insufficient documentation

## 2018-04-23 DIAGNOSIS — F329 Major depressive disorder, single episode, unspecified: Secondary | ICD-10-CM | POA: Insufficient documentation

## 2018-04-23 DIAGNOSIS — R7303 Prediabetes: Secondary | ICD-10-CM | POA: Insufficient documentation

## 2018-04-23 DIAGNOSIS — F3289 Other specified depressive episodes: Secondary | ICD-10-CM | POA: Diagnosis not present

## 2018-04-23 MED ORDER — BUPROPION HCL ER (SR) 150 MG PO TB12: 150.0000 mg | ORAL_TABLET | Freq: Every day | ORAL | 0 refills | Status: DC

## 2018-04-23 MED ORDER — AMLODIPINE BESYLATE 5 MG PO TABS: 5.0000 mg | ORAL_TABLET | Freq: Every day | ORAL | 0 refills | Status: DC

## 2018-04-23 MED ORDER — VITAMIN D (ERGOCALCIFEROL) 1.25 MG (50000 UNIT) PO CAPS: 50000.0000 [IU] | ORAL_CAPSULE | ORAL | 0 refills | Status: DC

## 2018-04-23 MED ORDER — METFORMIN HCL 500 MG PO TABS: 500.0000 mg | ORAL_TABLET | Freq: Every day | ORAL | 0 refills | Status: DC

## 2018-04-24 NOTE — Progress Notes (Signed)
Office: [phone]  /  Fax: [phone]   HPI:   Chief Complaint: OBESITY Kim Barnes is here to discuss her progress with her obesity treatment plan. She is on the Category 2 plan plus breakfast options and is following her eating plan approximately 50 % of the time. She states she is exercising 0 minutes 0 times per week. [name] has gotten off track over Mother's Day and has been struggling to plan meals ahead of time. Kim Barnes is ready to get back on track. Her weight is (!) 364 lb (165.1 kg) today and has had a weight gain of 2 pounds over a period of 2 weeks since her last visit. She has lost 14 lbs since starting treatment with [name].  Vitamin D deficiency Kim Barnes has a diagnosis of vitamin D deficiency. She is stable on vit D and denies nausea, vomiting or muscle weakness.  Pre-Diabetes Kim Barnes has a diagnosis of prediabetes based on her elevated Hgb A1c and was informed this puts her at greater risk of developing diabetes. She is stable on metformin and diet. Kim Barnes continues to work on diet and exercise to decrease risk of diabetes. She denies nausea, vomiting or hypoglycemia.  Hypertension Fatimah L Tye is a 42 y.o. female with hypertension. [name] blood pressure is still not controlled on bystolic and maxzide.  Moe L Tripp denies chest pain. She is working weight loss to help control her blood pressure with the goal of decreasing her risk of heart attack and stroke.   At risk for cardiovascular disease Kim Barnes is at a higher than average risk for cardiovascular disease due to obesity and hypertension. She currently denies any chest pain.  Depression with emotional eating behaviors Kim Barnes notes increased emotional eating in the last few weeks and is feeling frustrated. Kim Barnes struggles with emotional eating and using food for comfort to the extent that it is negatively impacting her health. She often snacks when she is not hungry. Kim Barnes sometimes feels she is out of control and then  feels guilty that she made poor food choices. She has been working on behavior modification techniques to help reduce her emotional eating and has been somewhat successful. She shows no sign of suicidal or homicidal ideations.  Depression screen PHQ 2/9 02/26/2018  Decreased Interest 2  Down, Depressed, Hopeless 1  PHQ - 2 Score 3  Altered sleeping 2  Tired, decreased energy 2  Change in appetite 2  Feeling bad or failure about yourself  2  Trouble concentrating 1  Moving slowly or fidgety/restless 1  Suicidal thoughts 0  PHQ-9 Score 13  Difficult doing work/chores Somewhat difficult     ALLERGIES: No Known Allergies  MEDICATIONS: Current Outpatient Medications on File Prior to Visit  Medication Sig Dispense Refill  . ibuprofen (ADVIL,MOTRIN) 200 MG tablet Take 1,200 mg by mouth daily as needed for moderate pain (tooth pain).    . Nebivolol HCl (BYSTOLIC) 20 MG TABS Take 1 tablet (20 mg total) by mouth daily. 30 tablet 11  . triamterene-hydrochlorothiazide (MAXZIDE-25) 37.5-25 MG tablet TAKE 1 TABLET BY MOUTH DAILY. 30 tablet 11   No current facility-administered medications on file prior to visit.     PAST MEDICAL HISTORY: Past Medical History:  Diagnosis Date  . Anxiety   . Asthma    prn inhaler  . Back pain   . Bell's palsy   . Carpal tunnel syndrome of left wrist 01/2013  . Chest pain   . Cold hands and feet   . Cough   .  Depression   . Dry mouth   . Dry skin   . Fatigue   . GERD (gastroesophageal reflux disease)    occasional; no current med.  Kim Barnes History of brain tumor 1996   no neurologic deficits  . HTN (hypertension)    under control with med., has been on med. x 5 yr.  . Joint pain   . Obesity   . Obstructive sleep apnea   . PCOS (polycystic ovarian syndrome)   . Polycystic ovarian syndrome    no current med.; irregular periods  . Shortness of breath   . Stress   . Swelling of extremity   . Wheezing     PAST SURGICAL HISTORY: Past Surgical  History:  Procedure Laterality Date  . BRAIN SURGERY  1996   exc. tumor  . CARPAL TUNNEL RELEASE Left 02/10/2013   Procedure: CARPAL TUNNEL RELEASE;  Surgeon: Nicki Reaper, MD;  Location: [facility];  Service: Orthopedics;  Laterality: Left;  ANESTHESIA: IV REGIONAL FAB  . HYSTEROSCOPY W/D&C  02/21/2006  . HYSTEROSCOPY W/D&C  12/17/2011   Procedure: DILATATION AND CURETTAGE /HYSTEROSCOPY;  Surgeon: Meriel Pica, MD;  Location: [facility];  Service: Gynecology;  Laterality: N/A;  . LEEP  01/07/2004  . TONSILLECTOMY  09/21/2002    SOCIAL HISTORY: Social History   Tobacco Use  . Smoking status: Never Smoker  . Smokeless tobacco: Never Used  Substance Use Topics  . Alcohol use: Yes    Comment: rare; socially  . Drug use: No    FAMILY HISTORY: Family History  Problem Relation Age of Onset  . Hypertension Other   . Lung cancer Father        Smoker  . Cancer Father        lung/ smoker  . Heart disease Father   . Alcoholism Father   . Breast cancer Maternal Grandmother   . Cancer Maternal Grandmother        breast  . Emphysema Paternal Grandmother   . Asthma Mother   . Hyperlipidemia Mother   . Hypertension Mother   . Sleep apnea Mother   . Asthma Maternal Aunt     ROS: Review of Systems  Constitutional: Negative for weight loss.  Cardiovascular: Negative for chest pain.  Gastrointestinal: Negative for nausea and vomiting.  Musculoskeletal:       Negative for muscle weakness  Psychiatric/Behavioral: Positive for depression. Negative for suicidal ideas.    PHYSICAL EXAM: Blood pressure (!) 147/70, pulse (!) 58, temperature 98.1 F (36.7 C), temperature source Oral, height  (1.676 m), weight (!) 364 lb (165.1 kg), last menstrual period 04/11/2018, SpO2 99 %. Body mass index is 58.75 kg/m. Physical Exam  Constitutional: She is oriented to person, place, and time. She appears well-developed and well-nourished.  Cardiovascular: Normal rate.    Pulmonary/Chest: Effort normal.  Musculoskeletal: Normal range of motion.  Neurological: She is oriented to person, place, and time.  Skin: Skin is warm and dry.  Psychiatric: She has a normal mood and affect. Her behavior is normal.  Vitals reviewed.   RECENT LABS AND TESTS: BMET    Component Value Date/Time   NA 143 02/26/2018 0936   K 3.9 02/26/2018 0936   CL 103 02/26/2018 0936   CO2 26 02/26/2018 0936   GLUCOSE 87 02/26/2018 0936   GLUCOSE 103 (H) 12/31/2017 0926   GLUCOSE 83 10/08/2006 1418   BUN 7 02/26/2018 0936   CREATININE 0.78 02/26/2018 0936   CALCIUM 9.2 02/26/2018  0981   GFRNONAA 96 02/26/2018 0936   GFRAA 111 02/26/2018 0936   Lab Results  Component Value Date   HGBA1C 5.8 (H) 02/26/2018   HGBA1C 5.3 03/31/2015   HGBA1C 6.0 04/20/2011   HGBA1C 5.9 05/24/2010   HGBA1C 5.7 10/05/2008   Lab Results  Component Value Date   INSULIN 26.4 (H) 02/26/2018   CBC    Component Value Date/Time   WBC 5.4 02/26/2018 0936   WBC 5.7 12/31/2017 0926   RBC 4.74 02/26/2018 0936   RBC 4.82 12/31/2017 0926   HGB 13.6 02/26/2018 0936   HCT 42.2 02/26/2018 0936   PLT 281.0 12/31/2017 0926   MCV 89 02/26/2018 0936   MCH 28.7 02/26/2018 0936   MCH 29.3 02/04/2017 1851   MCHC 32.2 02/26/2018 0936   MCHC 33.8 12/31/2017 0926   RDW 14.7 02/26/2018 0936   LYMPHSABS 2.1 02/26/2018 0936   MONOABS 0.3 12/31/2017 0926   EOSABS 0.1 02/26/2018 0936   BASOSABS 0.0 02/26/2018 0936   Iron/TIBC/Ferritin/ %Sat No results found for: IRON, TIBC, FERRITIN, IRONPCTSAT Lipid Panel     Component Value Date/Time   CHOL 216 (H) 02/26/2018 0936   TRIG 85 02/26/2018 0936   HDL 47 02/26/2018 0936   CHOLHDL 4 12/31/2017 0926   VLDL 17.2 12/31/2017 0926   LDLCALC 152 (H) 02/26/2018 0936   LDLDIRECT 149.4 05/24/2010 0904   Hepatic Function Panel     Component Value Date/Time   PROT 7.6 02/26/2018 0936   ALBUMIN 4.4 02/26/2018 0936   AST 20 02/26/2018 0936   ALT 18 02/26/2018  0936   ALKPHOS 69 02/26/2018 0936   BILITOT 0.5 02/26/2018 0936   BILIDIR 0.0 12/31/2017 0926      Component Value Date/Time   TSH 2.540 02/26/2018 0936   TSH 1.78 12/31/2017 0926   TSH 1.12 10/01/2013 1056   Results for Morine, Miguelina L (MRN [id number]) as of 04/24/2018 09:16  Ref. Range 02/26/2018 09:36  Vitamin D, 25-Hydroxy Latest Ref Range: 30.0 - 100.0 ng/mL 11.1 (L)   ASSESSMENT AND PLAN: Essential hypertension - Plan: amLODipine (NORVASC) 5 MG tablet  Vitamin D deficiency - Plan: Vitamin D, Ergocalciferol, (DRISDOL) 50000 units CAPS capsule  Prediabetes - Plan: metFORMIN (GLUCOPHAGE) 500 MG tablet  Other depression - with emotional eating - Plan: buPROPion (WELLBUTRIN SR) 150 MG 12 hr tablet  At risk for heart disease  Class 3 severe obesity with serious comorbidity and body mass index (BMI) of 50.0 to 59.9 in adult, unspecified obesity type (HCC)  PLAN:  Vitamin D Deficiency Marykathryn was informed that low vitamin D levels contributes to fatigue and are associated with obesity, breast, and colon cancer. She agrees to continue to take prescription Vit D ,000 IU every week #4 with no refills and will follow up for routine testing of vitamin D, at least 2-3 times per year. She was informed of the risk of over-replacement of vitamin D and agrees to not increase her dose unless she discusses this with [name] first. Earlean agrees to follow up as directed.  Pre-Diabetes Sunaina will continue to work on weight loss, exercise, and decreasing simple carbohydrates in her diet to help decrease the risk of diabetes. We dicussed metformin including benefits and risks. She was informed that eating too many simple carbohydrates or too many calories at one sitting increases the likelihood of GI side effects. Jenifer requested metformin for now and a prescription was written today for 1 month refill. Marcey agreed to follow up with [name]  as directed to monitor her progress.  Hypertension We discussed  sodium restriction, working on healthy weight loss, and a regular exercise program as the means to achieve improved blood pressure control. Gerri agreed with this plan and agreed to follow up as directed. We will continue to monitor her blood pressure as well as her progress with the above lifestyle modifications. She will continue bystolic and maxzide as prescribed. She agrees to start amlodipine 5 mg qd #30 with no refills and will watch for signs of hypotension as she continues her lifestyle modifications.  Cardiovascular risk counseling Mabry was given extended (15 minutes) coronary artery disease prevention counseling today. She is 42 y.o. female and has risk factors for heart disease including obesity and hypertension. We discussed intensive lifestyle modifications today with an emphasis on specific weight loss instructions and strategies. Pt was also informed of the importance of increasing exercise and decreasing saturated fats to help prevent heart disease.  Depression with Emotional Eating Behaviors We discussed behavior modification techniques today to help Sheilla deal with her emotional eating and depression. She has agreed to start Wellbutrin SR 150 mg qAM #30 with no refills and follow up as directed.  Obesity Khala is currently in the action stage of change. As such, her goal is to continue with weight loss efforts She has agreed to follow the Category 2 plan Caryl has been instructed to work up to a goal of 150 minutes of combined cardio and strengthening exercise per week for weight loss and overall health benefits. We discussed the following Behavioral Modification Strategies today: increasing lean protein intake, decreasing simple carbohydrates  and work on meal planning and easy cooking plans  Syrianna has agreed to follow up with our clinic in 3 weeks. She was informed of the importance of frequent follow up visits to maximize her success with intensive lifestyle modifications  for her multiple health conditions.   OBESITY BEHAVIORAL INTERVENTION VISIT  Today's visit was # 5 out of 22.  Starting weight: 378 lbs Starting date: 02/26/18 Today's weight : 364 lbs  Today's date: 04/23/2018 Total lbs lost to date: 14 (Patients must lose 7 lbs in the first 6 months to continue with counseling)   ASK: We discussed the diagnosis of obesity with Kayliee L Crabbe today and Fritzi agreed to give [name] permission to discuss obesity behavioral modification therapy today.  ASSESS: Amando has the diagnosis of obesity and her BMI today is 58.78 Kimi is in the action stage of change   ADVISE: Angelyna was educated on the multiple health risks of obesity as well as the benefit of weight loss to improve her health. She was advised of the need for long term treatment and the importance of lifestyle modifications.  AGREE: Multiple dietary modification options and treatment options were discussed and  [place] agreed to the above obesity treatment plan.  I, [place] Crane, am acting as transcriptionist for Quillian Quince, MD  I have reviewed the above documentation for accuracy and completeness, and I agree with the above. -Quillian Quince, MD

## 2018-04-28 ENCOUNTER — Encounter: Payer: Self-pay | Admitting: Internal Medicine

## 2018-04-28 ENCOUNTER — Ambulatory Visit (INDEPENDENT_AMBULATORY_CARE_PROVIDER_SITE_OTHER)
Admission: RE | Admit: 2018-04-28 | Discharge: 2018-04-28 | Disposition: A | Discharge status: Home / Self Care | Payer: 59 | Source: Ambulatory Visit | Attending: Internal Medicine | Admitting: Internal Medicine

## 2018-04-28 ENCOUNTER — Ambulatory Visit (INDEPENDENT_AMBULATORY_CARE_PROVIDER_SITE_OTHER): Payer: 59 | Admitting: Internal Medicine

## 2018-04-28 VITALS — BP 132/72 | HR 62 | Temp 98.4°F | Ht 66.0 in | Wt 372.0 lb

## 2018-04-28 DIAGNOSIS — M546 Pain in thoracic spine: Secondary | ICD-10-CM | POA: Diagnosis not present

## 2018-04-28 DIAGNOSIS — E559 Vitamin D deficiency, unspecified: Secondary | ICD-10-CM | POA: Diagnosis not present

## 2018-04-28 DIAGNOSIS — I10 Essential (primary) hypertension: Secondary | ICD-10-CM | POA: Diagnosis not present

## 2018-04-28 DIAGNOSIS — G8929 Other chronic pain: Secondary | ICD-10-CM | POA: Diagnosis not present

## 2018-04-28 DIAGNOSIS — M545 Low back pain, unspecified: Secondary | ICD-10-CM | POA: Insufficient documentation

## 2018-04-28 MED ORDER — MELOXICAM 15 MG PO TABS: 15.0000 mg | ORAL_TABLET | Freq: Every day | ORAL | 1 refills | Status: DC | PRN

## 2018-04-28 NOTE — Patient Instructions (Signed)
Standing desk Standing desk mat Yoga ball

## 2018-04-28 NOTE — Assessment & Plan Note (Signed)
On Triamt HCTZ and Bystolic

## 2018-04-28 NOTE — Assessment & Plan Note (Signed)
Standing desk Standing desk mat Yoga ball X ray

## 2018-04-28 NOTE — Assessment & Plan Note (Signed)
Vit D 

## 2018-04-28 NOTE — Assessment & Plan Note (Signed)
Standing desk Standing desk mat Yoga ball X ray 

## 2018-04-28 NOTE — Progress Notes (Signed)
Subjective:  Patient ID: Kim Barnes, female    DOB: Feb 06, 1976  Age: 42 y.o. MRN: [id number]  CC: No chief complaint on file.   HPI Kim Barnes presents for LBP x long time, gradually worse - thoracic and LS back. Worse w/ROM - 8/10  Outpatient Medications Prior to Visit  Medication Sig Dispense Refill  . amLODipine (NORVASC) 5 MG tablet Take 1 tablet (5 mg total) by mouth daily. 30 tablet 0  . buPROPion (WELLBUTRIN SR) 150 MG 12 hr tablet Take 1 tablet (150 mg total) by mouth daily. 30 tablet 0  . ibuprofen (ADVIL,MOTRIN) 200 MG tablet Take 1,200 mg by mouth daily as needed for moderate pain (tooth pain).    . metFORMIN (GLUCOPHAGE) 500 MG tablet Take 1 tablet (500 mg total) by mouth daily with breakfast. 30 tablet 0  . Nebivolol HCl (BYSTOLIC) 20 MG TABS Take 1 tablet (20 mg total) by mouth daily. 30 tablet 11  . triamterene-hydrochlorothiazide (MAXZIDE-25) 37.5-25 MG tablet TAKE 1 TABLET BY MOUTH DAILY. 30 tablet 11  . Vitamin D, Ergocalciferol, (DRISDOL) 50000 units CAPS capsule Take 1 capsule (50,000 Units total) by mouth every 7 (seven) days. 4 capsule 0   No facility-administered medications prior to visit.     ROS Review of Systems  Constitutional: Negative for activity change, appetite change, chills, fatigue and unexpected weight change.  HENT: Negative for congestion, mouth sores and sinus pressure.   Eyes: Negative for visual disturbance.  Respiratory: Negative for cough and chest tightness.   Gastrointestinal: Negative for abdominal pain and nausea.  Genitourinary: Negative for difficulty urinating, frequency and vaginal pain.  Musculoskeletal: Positive for back pain. Negative for gait problem.  Skin: Negative for pallor and rash.  Neurological: Negative for dizziness, tremors, weakness, numbness and headaches.  Psychiatric/Behavioral: Negative for confusion and sleep disturbance.    Objective:  BP 132/72 (BP Location: Left Arm, Patient Position: Sitting,  Cuff Size: Large)   Pulse 62   Temp 98.4 F (36.9 C) (Oral)   Ht  (1.676 m)   Wt (!) 372 lb (168.7 kg)   LMP 04/11/2018 (Exact Date)   SpO2 99%   BMI 60.04 kg/m   BP Readings from Last 3 Encounters:  04/28/18 132/72  04/23/18 (!) 147/70  04/09/18 (!) 149/93    Wt Readings from Last 3 Encounters:  04/28/18 (!) 372 lb (168.7 kg)  04/23/18 (!) 364 lb (165.1 kg)  04/09/18 (!) 362 lb (164.2 kg)    Physical Exam  Constitutional: She appears well-developed. No distress.  HENT:  Head: Normocephalic.  Right Ear: External ear normal.  Left Ear: External ear normal.  Nose: Nose normal.  Mouth/Throat: Oropharynx is clear and moist.  Eyes: Pupils are equal, round, and reactive to light. Conjunctivae are normal. Right eye exhibits no discharge. Left eye exhibits no discharge.  Neck: Normal range of motion. Neck supple. No JVD present. No tracheal deviation present. No thyromegaly present.  Cardiovascular: Normal rate, regular rhythm and normal heart sounds.  Pulmonary/Chest: No stridor. No respiratory distress. She has no wheezes.  Abdominal: Soft. Bowel sounds are normal. She exhibits no distension and no mass. There is no tenderness. There is no rebound and no guarding.  Musculoskeletal: She exhibits tenderness. She exhibits no edema.  Lymphadenopathy:    She has no cervical adenopathy.  Neurological: She displays normal reflexes. No cranial nerve deficit. She exhibits normal muscle tone. Coordination normal.  Skin: No rash noted. No erythema.  Psychiatric: She has a normal  mood and affect. Her behavior is normal. Judgment and thought content normal.   Obese Back NT  Lab Results  Component Value Date   WBC 5.4 02/26/2018   HGB 13.6 02/26/2018   HCT 42.2 02/26/2018   PLT 281.0 12/31/2017   GLUCOSE 87 02/26/2018   CHOL 216 (H) 02/26/2018   TRIG 85 02/26/2018   HDL 47 02/26/2018   LDLDIRECT 149.4 05/24/2010   LDLCALC 152 (H) 02/26/2018   ALT 18 02/26/2018   AST 20  02/26/2018   NA 143 02/26/2018   K 3.9 02/26/2018   CL 103 02/26/2018   CREATININE 0.78 02/26/2018   BUN 7 02/26/2018   CO2 26 02/26/2018   TSH 2.540 02/26/2018   INR 1.16 02/04/2017   HGBA1C 5.8 (H) 02/26/2018    Dg Chest 2 View  Result Date: 12/03/2017 CLINICAL DATA:  42 year old female with chest pain for EXAM: CHEST  2 VIEW COMPARISON:  Chest radiograph dated 06/01/2016 FINDINGS: The heart size and mediastinal contours are within normal limits. Both lungs are clear. The visualized skeletal structures are unremarkable. IMPRESSION: No active cardiopulmonary disease. Electronically Signed   By: Elgie Collard M.D.   On: 12/03/2017 00:57    Assessment & Plan:   There are no diagnoses linked to this encounter. I am having Kim Barnes maintain her ibuprofen, Nebivolol HCl, triamterene-hydrochlorothiazide, buPROPion, Vitamin D (Ergocalciferol), metFORMIN, and amLODipine.  No orders of the defined types were placed in this encounter.    Follow-up: No follow-ups on file.  Sonda Primes, MD

## 2018-05-14 ENCOUNTER — Ambulatory Visit (INDEPENDENT_AMBULATORY_CARE_PROVIDER_SITE_OTHER): Payer: 59 | Admitting: Family Medicine

## 2018-05-14 VITALS — BP 130/84 | HR 53 | Temp 98.0°F | Ht 66.0 in | Wt 357.0 lb

## 2018-05-14 DIAGNOSIS — F3289 Other specified depressive episodes: Secondary | ICD-10-CM

## 2018-05-14 DIAGNOSIS — Z6841 Body Mass Index (BMI) 40.0 and over, adult: Secondary | ICD-10-CM

## 2018-05-14 DIAGNOSIS — I10 Essential (primary) hypertension: Secondary | ICD-10-CM | POA: Diagnosis not present

## 2018-05-14 DIAGNOSIS — Z9189 Other specified personal risk factors, not elsewhere classified: Secondary | ICD-10-CM

## 2018-05-14 MED ORDER — BUPROPION HCL ER (SR) 150 MG PO TB12: 150.0000 mg | ORAL_TABLET | Freq: Every day | ORAL | 0 refills | Status: DC

## 2018-05-14 MED ORDER — AMLODIPINE BESYLATE 5 MG PO TABS: 5.0000 mg | ORAL_TABLET | Freq: Every day | ORAL | 0 refills | Status: DC

## 2018-05-14 NOTE — Progress Notes (Signed)
Office: [phone]  /  Fax: [phone]   HPI:   Chief Complaint: OBESITY Kim Barnes is here to discuss her progress with her obesity treatment plan. She is on the Category 2 plan and is following her eating plan approximately 50-60 % of the time. She states she is exercising 0 minutes 0 times per week. Kim Barnes continues to do well with weight loss. She is still struggling to eat breakfast even with extra options.  Her weight is (!) 357 lb (161.9 kg) today and has had a weight loss of 7 pounds over a period of 3 weeks since her last visit. She has lost 21 lbs since starting treatment with Kim Barnes.  Hypertension Kim Barnes is a 42 y.o. female with hypertension. Kim Barnes's blood pressure greatly improved with medications and weight loss. She denies chest pain or headache. She is working weight loss to help control her blood pressure with the goal of decreasing her risk of heart attack and stroke. Kim Barnes's blood pressure is currently controlled.  At risk for cardiovascular disease Kim Barnes is at a higher than average risk for cardiovascular disease due to obesity and hypertension. She currently denies any chest pain.  Depression with emotional eating behaviors Kim Barnes's mood improved on Wellbutrin, decreased emotional eating and denies insomnia, blood pressure is stable. Kim Barnes struggles with emotional eating and using food for comfort to the extent that it is negatively impacting her health. She often snacks when she is not hungry. Kim Barnes sometimes feels she is out of control and then feels guilty that she made poor food choices. She has been working on behavior modification techniques to help reduce her emotional eating and has been somewhat successful. She shows no sign of suicidal or homicidal ideations.  Depression screen PHQ 2/9 02/26/2018  Decreased Interest 2  Down, Depressed, Hopeless 1  PHQ - 2 Score 3  Altered sleeping 2  Tired, decreased energy 2  Change in appetite 2  Feeling bad or  failure about yourself  2  Trouble concentrating 1  Moving slowly or fidgety/restless 1  Suicidal thoughts 0  PHQ-9 Score 13  Difficult doing work/chores Somewhat difficult    ALLERGIES: No Known Allergies  MEDICATIONS: Current Outpatient Medications on File Prior to Visit  Medication Sig Dispense Refill  . amLODipine (NORVASC) 5 MG tablet Take 1 tablet (5 mg total) by mouth daily. 30 tablet 0  . buPROPion (WELLBUTRIN SR) 150 MG 12 hr tablet Take 1 tablet (150 mg total) by mouth daily. 30 tablet 0  . ibuprofen (ADVIL,MOTRIN) 200 MG tablet Take 1,200 mg by mouth daily as needed for moderate pain (tooth pain).    . meloxicam (MOBIC) 15 MG tablet Take 1 tablet (15 mg total) by mouth daily as needed for pain. 30 tablet 1  . metFORMIN (GLUCOPHAGE) 500 MG tablet Take 1 tablet (500 mg total) by mouth daily with breakfast. 30 tablet 0  . Nebivolol HCl (BYSTOLIC) 20 MG TABS Take 1 tablet (20 mg total) by mouth daily. 30 tablet 11  . triamterene-hydrochlorothiazide (MAXZIDE-25) 37.5-25 MG tablet TAKE 1 TABLET BY MOUTH DAILY. 30 tablet 11  . Vitamin D, Ergocalciferol, (DRISDOL) 50000 units CAPS capsule Take 1 capsule (50,000 Units total) by mouth every 7 (seven) days. 4 capsule 0   No current facility-administered medications on file prior to visit.     PAST MEDICAL HISTORY: Past Medical History:  Diagnosis Date  . Anxiety   . Asthma    prn inhaler  . Back pain   . Bell's palsy   .  Carpal tunnel syndrome of left wrist 01/2013  . Chest pain   . Cold hands and feet   . Cough   . Depression   . Dry mouth   . Dry skin   . Fatigue   . GERD (gastroesophageal reflux disease)    occasional; no current med.  Kim Barnes History of brain tumor 1996   no neurologic deficits  . HTN (hypertension)    under control with med., has been on med. x 5 yr.  . Joint pain   . Obesity   . Obstructive sleep apnea   . PCOS (polycystic ovarian syndrome)   . Polycystic ovarian syndrome    no current med.;  irregular periods  . Shortness of breath   . Stress   . Swelling of extremity   . Wheezing     PAST SURGICAL HISTORY: Past Surgical History:  Procedure Laterality Date  . BRAIN SURGERY  1996   exc. tumor  . CARPAL TUNNEL RELEASE Left 02/10/2013   Procedure: CARPAL TUNNEL RELEASE;  Surgeon: Nicki Reaper, MD;  Location: [facility];  Service: Orthopedics;  Laterality: Left;  ANESTHESIA: IV REGIONAL FAB  . HYSTEROSCOPY W/D&C  02/21/2006  . HYSTEROSCOPY W/D&C  12/17/2011   Procedure: DILATATION AND CURETTAGE /HYSTEROSCOPY;  Surgeon: Meriel Pica, MD;  Location: [facility];  Service: Gynecology;  Laterality: N/A;  . LEEP  01/07/2004  . TONSILLECTOMY  09/21/2002    SOCIAL HISTORY: Social History   Tobacco Use  . Smoking status: Never Smoker  . Smokeless tobacco: Never Used  Substance Use Topics  . Alcohol use: Yes    Comment: rare; socially  . Drug use: No    FAMILY HISTORY: Family History  Problem Relation Age of Onset  . Hypertension Other   . Lung cancer Father        Smoker  . Cancer Father        lung/ smoker  . Heart disease Father   . Alcoholism Father   . Breast cancer Maternal Grandmother   . Cancer Maternal Grandmother        breast  . Emphysema Paternal Grandmother   . Asthma Mother   . Hyperlipidemia Mother   . Hypertension Mother   . Sleep apnea Mother   . Asthma Maternal Aunt     ROS: Review of Systems  Constitutional: Positive for weight loss.  Cardiovascular: Negative for chest pain.  Neurological: Negative for headaches.  Psychiatric/Behavioral: Positive for depression. Negative for suicidal ideas.    PHYSICAL EXAM: Blood pressure 130/84, pulse (!) 53, temperature 98 F (36.7 C), temperature source Oral, height 5\' 6"  (1.676 m), weight (!) 357 lb (161.9 kg), SpO2 99 %. Body mass index is 57.62 kg/m. Physical Exam  Constitutional: She is oriented to person, place, and time. She appears well-developed and well-nourished.    Cardiovascular: Normal rate.  Pulmonary/Chest: Effort normal.  Musculoskeletal: Normal range of motion.  Neurological: She is oriented to person, place, and time.  Skin: Skin is warm and dry.  Psychiatric: She has a normal mood and affect. Her behavior is normal.  Vitals reviewed.   RECENT LABS AND TESTS: BMET    Component Value Date/Time   NA 143 02/26/2018 0936   K 3.9 02/26/2018 0936   CL 103 02/26/2018 0936   CO2 26 02/26/2018 0936   GLUCOSE 87 02/26/2018 0936   GLUCOSE 103 (H) 12/31/2017 0926   GLUCOSE 83 10/08/2006 1418   BUN 7 02/26/2018 0936   CREATININE 0.78 02/26/2018  0936   CALCIUM 9.2 02/26/2018 0936   GFRNONAA 96 02/26/2018 0936   GFRAA 111 02/26/2018 0936   Lab Results  Component Value Date   HGBA1C 5.8 (H) 02/26/2018   HGBA1C 5.3 03/31/2015   HGBA1C 6.0 04/20/2011   HGBA1C 5.9 05/24/2010   HGBA1C 5.7 10/05/2008   Lab Results  Component Value Date   INSULIN 26.4 (H) 02/26/2018   CBC    Component Value Date/Time   WBC 5.4 02/26/2018 0936   WBC 5.7 12/31/2017 0926   RBC 4.74 02/26/2018 0936   RBC 4.82 12/31/2017 0926   HGB 13.6 02/26/2018 0936   HCT 42.2 02/26/2018 0936   PLT 281.0 12/31/2017 0926   MCV 89 02/26/2018 0936   MCH 28.7 02/26/2018 0936   MCH 29.3 02/04/2017 1851   MCHC 32.2 02/26/2018 0936   MCHC 33.8 12/31/2017 0926   RDW 14.7 02/26/2018 0936   LYMPHSABS 2.1 02/26/2018 0936   MONOABS 0.3 12/31/2017 0926   EOSABS 0.1 02/26/2018 0936   BASOSABS 0.0 02/26/2018 0936   Iron/TIBC/Ferritin/ %Sat No results found for: IRON, TIBC, FERRITIN, IRONPCTSAT Lipid Panel     Component Value Date/Time   CHOL 216 (H) 02/26/2018 0936   TRIG 85 02/26/2018 0936   HDL 47 02/26/2018 0936   CHOLHDL 4 12/31/2017 0926   VLDL 17.2 12/31/2017 0926   LDLCALC 152 (H) 02/26/2018 0936   LDLDIRECT 149.4 05/24/2010 0904   Hepatic Function Panel     Component Value Date/Time   PROT 7.6 02/26/2018 0936   ALBUMIN 4.4 02/26/2018 0936   AST 20  02/26/2018 0936   ALT 18 02/26/2018 0936   ALKPHOS 69 02/26/2018 0936   BILITOT 0.5 02/26/2018 0936   BILIDIR 0.0 12/31/2017 0926      Component Value Date/Time   TSH 2.540 02/26/2018 0936   TSH 1.78 12/31/2017 0926   TSH 1.12 10/01/2013 1056    ASSESSMENT AND PLAN: Essential hypertension - Plan: amLODipine (NORVASC) 5 MG tablet  Other depression - with emotional eating - Plan: buPROPion (WELLBUTRIN SR) 150 MG 12 hr tablet  At risk for heart disease  Class 3 severe obesity with serious comorbidity and body mass index (BMI) of 50.0 to 59.9 in adult, unspecified obesity type (HCC)  PLAN:  Hypertension We discussed sodium restriction, working on healthy weight loss, and a regular exercise program as the means to achieve improved blood pressure control. Affie agreed with this plan and agreed to follow up as directed. We will continue to monitor her blood pressure as well as her progress with the above lifestyle modifications. Mayce agrees to continue taking amlodipine 5 mg qd #30 and we will refill for 1 month, and she weill continue diet and exercise. She will watch for signs of hypotension as she continues her lifestyle modifications. Jasmeen agrees to follow up with our clinic in 2 to 3 weeks.  Cardiovascular risk counselling Myosha was given extended (15 minutes) coronary artery disease prevention counseling today. She is 42 y.o. female and has risk factors for heart disease including obesity and hypertension. We discussed intensive lifestyle modifications today with an emphasis on specific weight loss instructions and strategies. Pt was also informed of the importance of increasing exercise and decreasing saturated fats to help prevent heart disease.  Depression with Emotional Eating Behaviors We discussed behavior modification techniques today to help Leona deal with her emotional eating and depression. Sidnee agrees to continue taking Wellbutrin SR 150 mg qd #30 and we will refill  for 1 month.  Hellena agrees to follow up with our clinic in 2 to 3 weeks.  Obesity Omya is currently in the action stage of change. As such, her goal is to continue with weight loss efforts She has agreed to change to keep a food journal with 250-350 calories and 20 grams of protein at breakfast daily and follow the Category 2 plan Sosha has been instructed to work up to a goal of 150 minutes of combined cardio and strengthening exercise per week for weight loss and overall health benefits. We discussed the following Behavioral Modification Strategies today: increasing lean protein intake, decreasing simple carbohydrates  and work on meal planning and easy cooking plans   Markeshia has agreed to follow up with our clinic in 2 to 3 weeks. She was informed of the importance of frequent follow up visits to maximize her success with intensive lifestyle modifications for her multiple health conditions.   OBESITY BEHAVIORAL INTERVENTION VISIT  Today's visit was # 6 out of 22.  Starting weight: 378 lbs Starting date: 02/26/18 Today's weight : 357 lbs Today's date: 05/14/2018 Total lbs lost to date: 21 (Patients must lose 7 lbs in the first 6 months to continue with counseling)   ASK: We discussed the diagnosis of obesity with Savanha L Mcilrath today and Brinnley agreed to give us permission to discuss obesity behavioral modification therapy today.  ASSESS: Renalda has the diagnosis of obesity and her BMI today is 57.65 Sonnet is in the action stage of change   ADVISE: Lear was educated on the multiple health risks of obesity as well as the benefit of weight loss to improve her health. She was advised of the need for long term treatment and the importance of lifestyle modifications.  AGREE: Multiple dietary modification options and treatment options were discussed and  Vianny agreed to the above obesity treatment plan.  I, Burt KnackSharon Martin, am acting as transcriptionist for Quillian Quincearen Beasley, MD  I  have reviewed the above documentation for accuracy and completeness, and I agree with the above. -Quillian Quincearen Beasley, MD

## 2018-05-31 IMAGING — CT CT HEAD W/O CM
3 series · 14 of 47 positions shown, 16 images · non-contrast
Comparison: Head CT dated 10/19/2009 and brain MRI dated
10/14/2000.

CLINICAL DATA: Left-sided facial palsy since [REDACTED]. Previous MRI
report provides additional clinical data of a previous left temporal
tumor resection.

EXAM:
CT HEAD WITHOUT CONTRAST
TECHNIQUE: Contiguous axial images were obtained from the base of the skull
through the vertex without intravenous contrast.

[Series 2: head 5.0 h30s · axial · 0.47mm/px · z∈[-160,-15]mm · 8 of 35 slices shown, 10 images]
[im 3/35  brain]
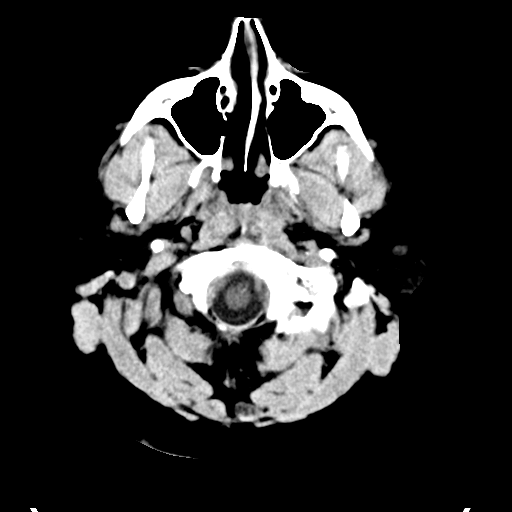
[im 3/35  bone]
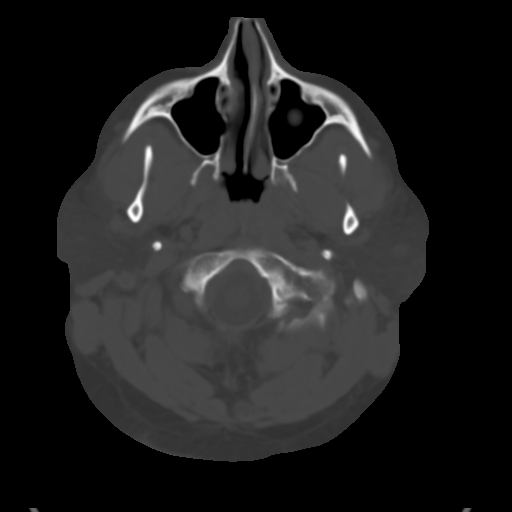
[im 8/35  brain]
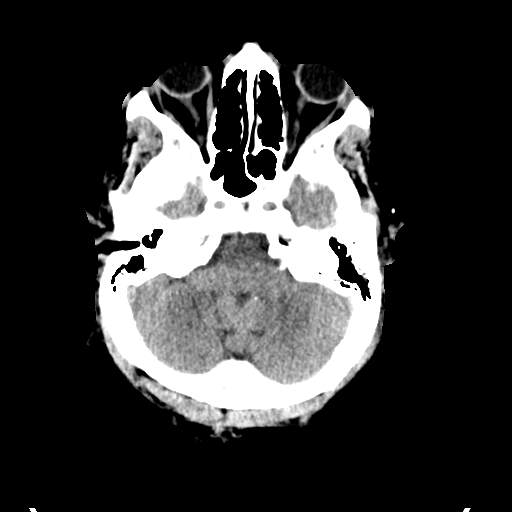
[im 11/35  brain]
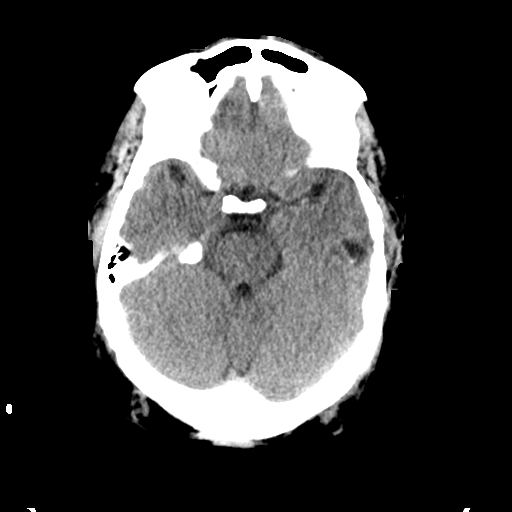
[im 16/35  brain]
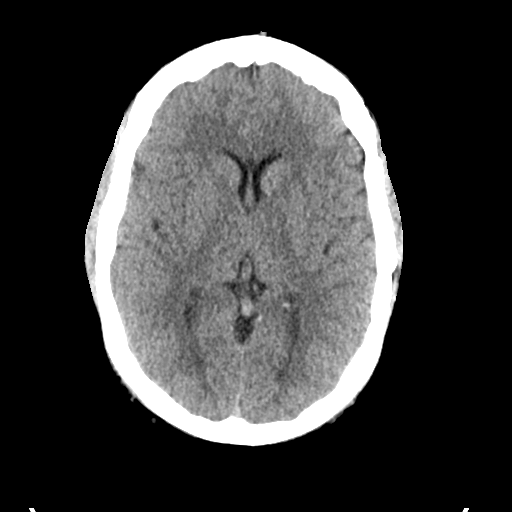
[im 19/35  brain]
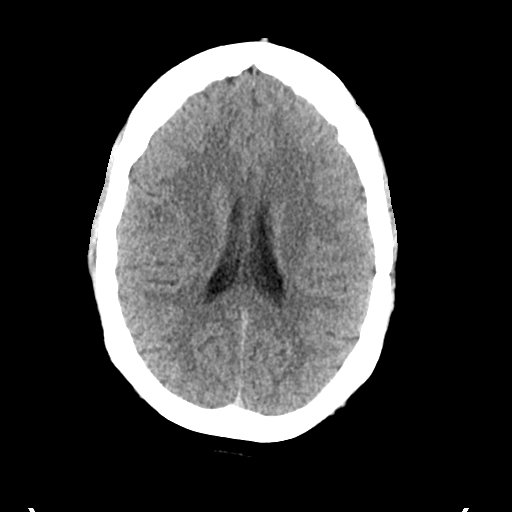
[im 19/35  bone]
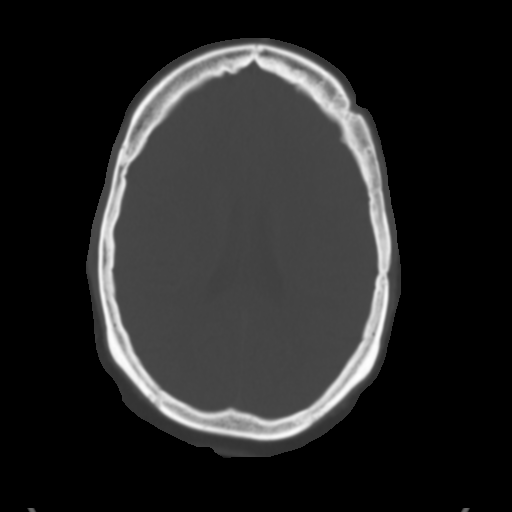
[im 24/35  brain]
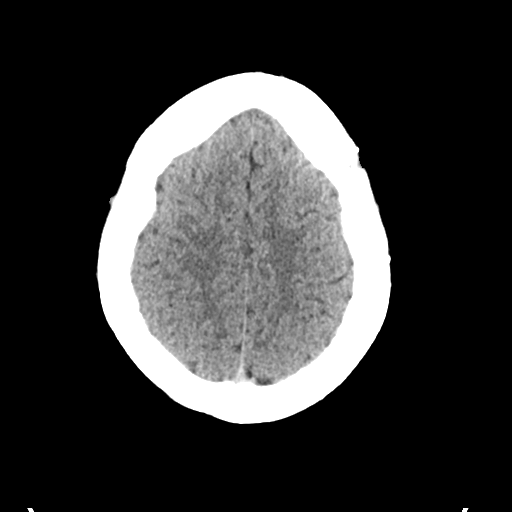
[im 27/35  brain]
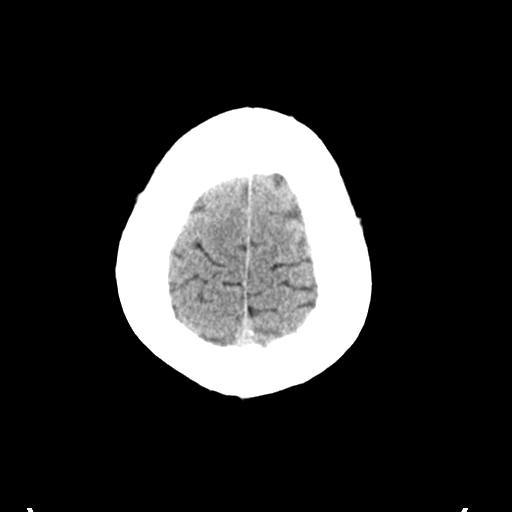
[im 32/35  brain]
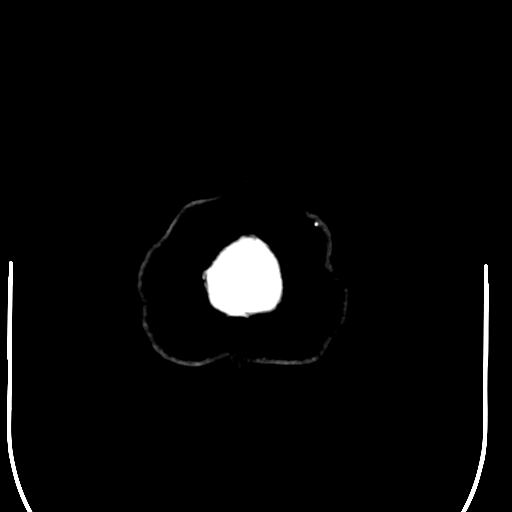

[Series 4: head 3.0 mpr cor · coronal · 0.34mm/px · 3 of 80 slices shown]
[im 27/80  brain]
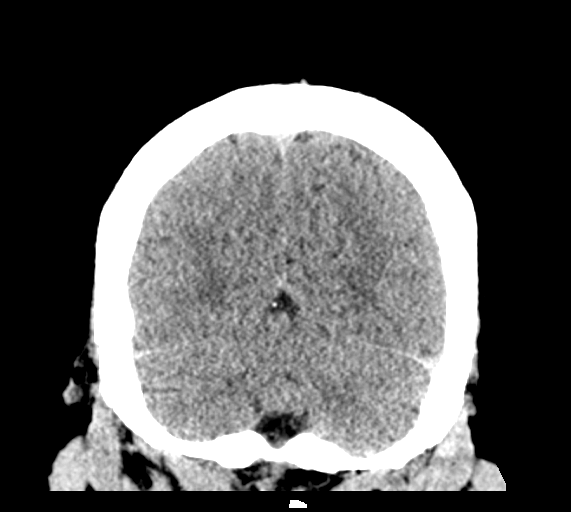
[im 36/80  brain]
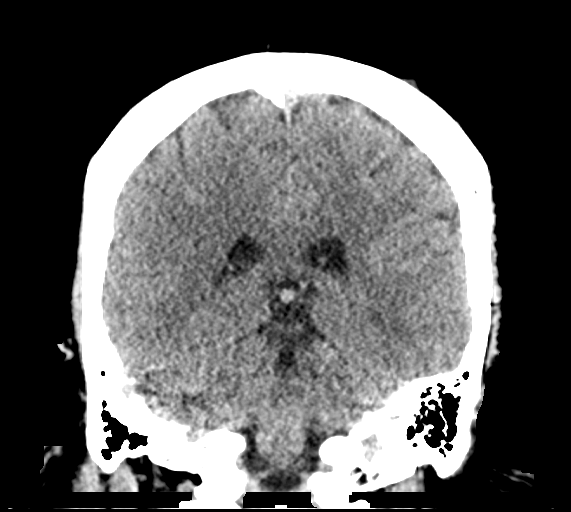
[im 44/80  brain]
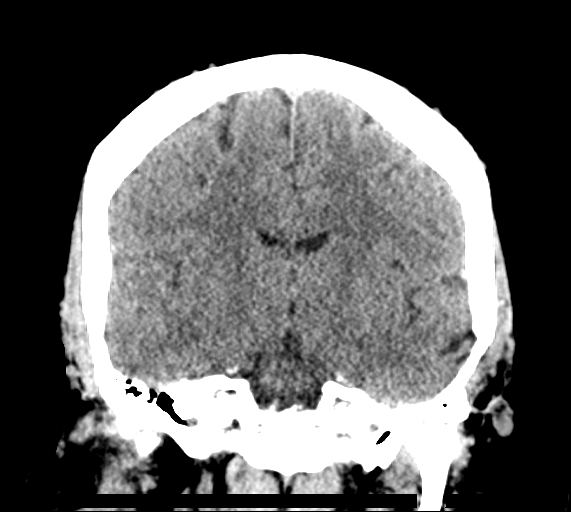

[Series 5: head 3.0 mpr sag · sagittal · 0.34mm/px · 3 of 63 slices shown]
[im 21/63  brain]
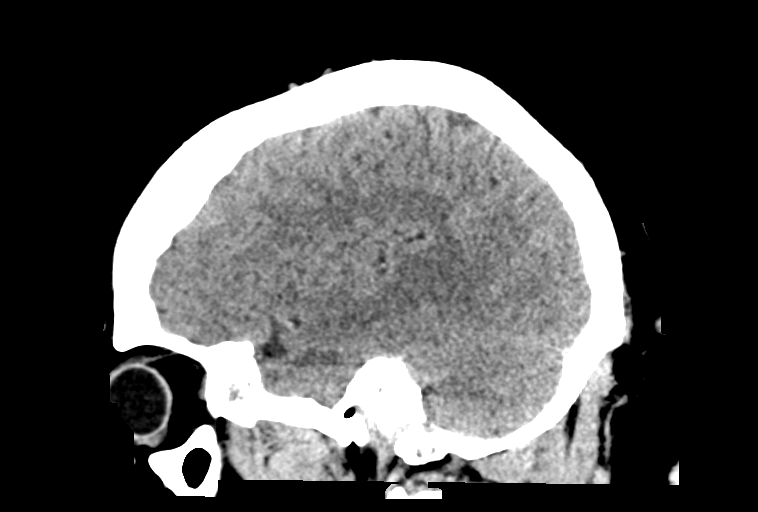
[im 32/63  brain]
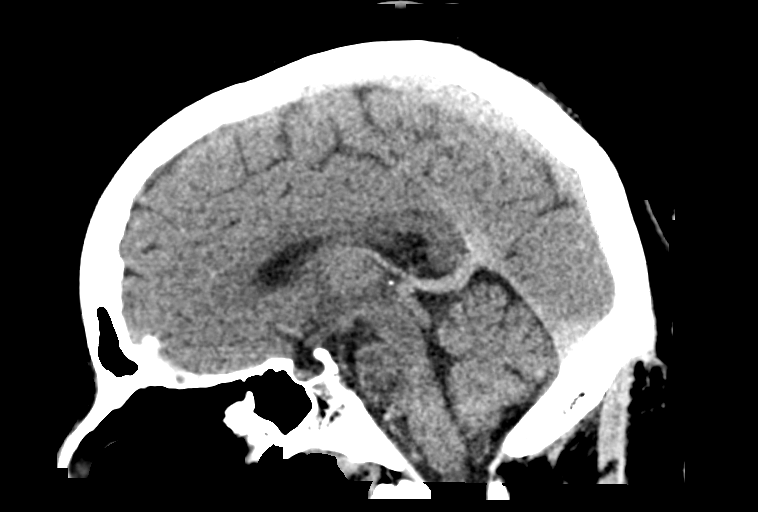
[im 42/63  brain]
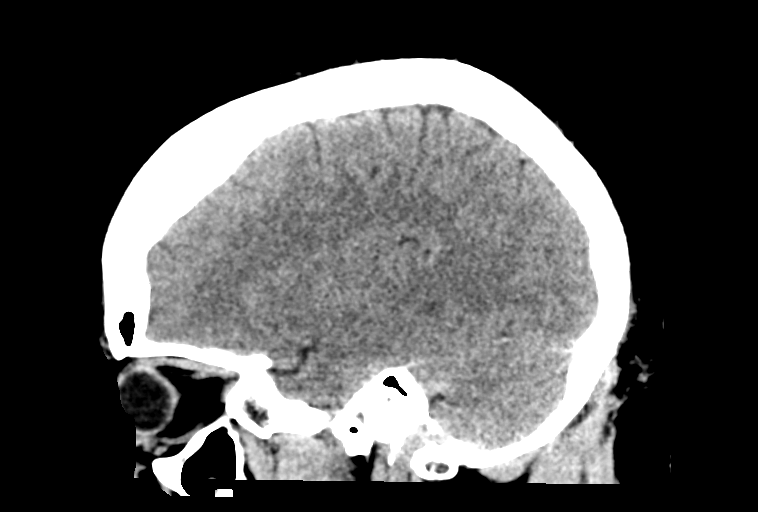

[14 of 47 positions shown; findings below may reference images not displayed]

FINDINGS: Brain: Ventricles remain normal in size and configuration. The small
area of chronic encephalomalacia within the left temporal lobe
appears stable, compatible with site of previous tumor resection,
with an associated chronic benign appearing calcification.

There is no mass, hemorrhage, edema or other evidence of acute
parenchymal abnormality. No extra-axial hemorrhage.

Vascular: No hyperdense vessel or unexpected calcification.

Skull: Postsurgical craniotomy changes of the left frontotemporal
bone appear stable. No acute appearing osseous abnormality.

Sinuses/Orbits: Chronic mucous retention cysts within the left
maxillary sinus. No evidence of acute sinusitis. Periorbital and
retro-orbital soft tissues are unremarkable.

Other: None.
IMPRESSION: 1. No acute findings.  No intracranial mass, hemorrhage or edema.
2. Postsurgical changes within the left temporal region appear
stable, as detailed above.

## 2018-06-03 ENCOUNTER — Ambulatory Visit (INDEPENDENT_AMBULATORY_CARE_PROVIDER_SITE_OTHER): Payer: 59 | Admitting: Family Medicine

## 2018-06-03 VITALS — BP 137/91 | HR 63 | Temp 97.9°F | Ht 66.0 in | Wt 357.0 lb

## 2018-06-03 DIAGNOSIS — Z6841 Body Mass Index (BMI) 40.0 and over, adult: Secondary | ICD-10-CM | POA: Diagnosis not present

## 2018-06-03 DIAGNOSIS — I10 Essential (primary) hypertension: Secondary | ICD-10-CM

## 2018-06-03 NOTE — Progress Notes (Signed)
Office: [phone]  /  Fax: [phone]   HPI:   Chief Complaint: OBESITY Kim Barnes is here to discuss her progress with her obesity treatment plan. She is on the  keep a food journal with 250 to 350 calories and 20 grams of protein at breakfast and the Category 2 plan and is following her eating plan approximately 50 % of the time. She states she is exercising 0 minutes 0 times per week. Kim Barnes has done well with maintaining, but she is still struggling to follow a structured plan and would like to discuss other options. Her weight is (!) 357 lb (161.9 kg) today and has maintained weight over a period of 3 weeks since her last visit. She has lost 21 lbs since starting treatment with [name].  Hypertension Kim Barnes is a 42 y.o. female with hypertension. Her diastolic blood pressure is elevated today, but overall has improved from when she first started with [name]. She is working on diet and weight loss. Kim Barnes is doing well taking her medications. Kim Barnes denies chest pain or headache. She is working weight loss to help control her blood pressure with the goal of decreasing her risk of heart attack and stroke. Kim Barnes blood pressure is not currently controlled.  ALLERGIES: No Known Allergies  MEDICATIONS: Current Outpatient Medications on File Prior to Visit  Medication Sig Dispense Refill  . amLODipine (NORVASC) 5 MG tablet Take 1 tablet (5 mg total) by mouth daily. 30 tablet 0  . buPROPion (WELLBUTRIN SR) 150 MG 12 hr tablet Take 1 tablet (150 mg total) by mouth daily. 30 tablet 0  . ibuprofen (ADVIL,MOTRIN) 200 MG tablet Take 1,200 mg by mouth daily as needed for moderate pain (tooth pain).    . meloxicam (MOBIC) 15 MG tablet Take 1 tablet (15 mg total) by mouth daily as needed for pain. 30 tablet 1  . metFORMIN (GLUCOPHAGE) 500 MG tablet Take 1 tablet (500 mg total) by mouth daily with breakfast. 30 tablet 0  . Nebivolol HCl (BYSTOLIC) 20 MG TABS Take 1 tablet (20 mg total) by  mouth daily. 30 tablet 11  . triamterene-hydrochlorothiazide (MAXZIDE-25) 37.5-25 MG tablet TAKE 1 TABLET BY MOUTH DAILY. 30 tablet 11  . Vitamin D, Ergocalciferol, (DRISDOL) 50000 units CAPS capsule Take 1 capsule (50,000 Units total) by mouth every 7 (seven) days. 4 capsule 0   No current facility-administered medications on file prior to visit.     PAST MEDICAL HISTORY: Past Medical History:  Diagnosis Date  . Anxiety   . Asthma    prn inhaler  . Back pain   . Bell's palsy   . Carpal tunnel syndrome of left wrist 01/2013  . Chest pain   . Cold hands and feet   . Cough   . Depression   . Dry mouth   . Dry skin   . Fatigue   . GERD (gastroesophageal reflux disease)    occasional; no current med.  [name] History of brain tumor 1996   no neurologic deficits  . HTN (hypertension)    under control with med., has been on med. x 5 yr.  . Joint pain   . Obesity   . Obstructive sleep apnea   . PCOS (polycystic ovarian syndrome)   . Polycystic ovarian syndrome    no current med.; irregular periods  . Shortness of breath   . Stress   . Swelling of extremity   . Wheezing     PAST SURGICAL HISTORY: Past Surgical  History:  Procedure Laterality Date  . BRAIN SURGERY  1996   exc. tumor  . CARPAL TUNNEL RELEASE Left 02/10/2013   Procedure: CARPAL TUNNEL RELEASE;  Surgeon: Nicki ReaperGary R Kuzma, MD;  Location: [facility];  Service: Orthopedics;  Laterality: Left;  ANESTHESIA: IV REGIONAL FAB  . HYSTEROSCOPY W/D&C  02/21/2006  . HYSTEROSCOPY W/D&C  12/17/2011   Procedure: DILATATION AND CURETTAGE /HYSTEROSCOPY;  Surgeon: Meriel Picaichard M Holland, MD;  Location: [facility];  Service: Gynecology;  Laterality: N/A;  . LEEP  01/07/2004  . TONSILLECTOMY  09/21/2002    SOCIAL HISTORY: Social History   Tobacco Use  . Smoking status: Never Smoker  . Smokeless tobacco: Never Used  Substance Use Topics  . Alcohol use: Yes    Comment: rare; socially  . Drug use: No    FAMILY HISTORY: Family  History  Problem Relation Age of Onset  . Hypertension Other   . Lung cancer Father        Smoker  . Cancer Father        lung/ smoker  . Heart disease Father   . Alcoholism Father   . Breast cancer Maternal Grandmother   . Cancer Maternal Grandmother        breast  . Emphysema Paternal Grandmother   . Asthma Mother   . Hyperlipidemia Mother   . Hypertension Mother   . Sleep apnea Mother   . Asthma Maternal Aunt     ROS: Review of Systems  Constitutional: Negative for weight loss.  Cardiovascular: Negative for chest pain.  Neurological: Negative for headaches.    PHYSICAL EXAM: Blood pressure (!) 137/91, pulse 63, temperature 97.9 F (36.6 C), temperature source Oral, height 5\' 6"  (1.676 m), weight (!) 357 lb (161.9 kg), SpO2 98 %. Body mass index is 57.62 kg/m. Physical Exam  Constitutional: She is oriented to person, place, and time. She appears well-developed and well-nourished.  Cardiovascular: Normal rate.  Pulmonary/Chest: Effort normal.  Musculoskeletal: Normal range of motion.  Neurological: She is oriented to person, place, and time.  Skin: Skin is warm and dry.  Psychiatric: She has a normal mood and affect. Her behavior is normal.  Vitals reviewed.   RECENT LABS AND TESTS: BMET    Component Value Date/Time   NA 143 02/26/2018 0936   K 3.9 02/26/2018 0936   CL 103 02/26/2018 0936   CO2 26 02/26/2018 0936   GLUCOSE 87 02/26/2018 0936   GLUCOSE 103 (H) 12/31/2017 0926   GLUCOSE 83 10/08/2006 1418   BUN 7 02/26/2018 0936   CREATININE 0.78 02/26/2018 0936   CALCIUM 9.2 02/26/2018 0936   GFRNONAA 96 02/26/2018 0936   GFRAA 111 02/26/2018 0936   Lab Results  Component Value Date   HGBA1C 5.8 (H) 02/26/2018   HGBA1C 5.3 03/31/2015   HGBA1C 6.0 04/20/2011   HGBA1C 5.9 05/24/2010   HGBA1C 5.7 10/05/2008   Lab Results  Component Value Date   INSULIN 26.4 (H) 02/26/2018   CBC    Component Value Date/Time   WBC 5.4 02/26/2018 0936   WBC 5.7  12/31/2017 0926   RBC 4.74 02/26/2018 0936   RBC 4.82 12/31/2017 0926   HGB 13.6 02/26/2018 0936   HCT 42.2 02/26/2018 0936   PLT 281.0 12/31/2017 0926   MCV 89 02/26/2018 0936   MCH 28.7 02/26/2018 0936   MCH 29.3 02/04/2017 1851   MCHC 32.2 02/26/2018 0936   MCHC 33.8 12/31/2017 0926   RDW 14.7 02/26/2018 0936   LYMPHSABS  2.1 02/26/2018 0936   MONOABS 0.3 12/31/2017 0926   EOSABS 0.1 02/26/2018 0936   BASOSABS 0.0 02/26/2018 0936   Iron/TIBC/Ferritin/ %Sat No results found for: IRON, TIBC, FERRITIN, IRONPCTSAT Lipid Panel     Component Value Date/Time   CHOL 216 (H) 02/26/2018 0936   TRIG 85 02/26/2018 0936   HDL 47 02/26/2018 0936   CHOLHDL 4 12/31/2017 0926   VLDL 17.2 12/31/2017 0926   LDLCALC 152 (H) 02/26/2018 0936   LDLDIRECT 149.4 05/24/2010 0904   Hepatic Function Panel     Component Value Date/Time   PROT 7.6 02/26/2018 0936   ALBUMIN 4.4 02/26/2018 0936   AST 20 02/26/2018 0936   ALT 18 02/26/2018 0936   ALKPHOS 69 02/26/2018 0936   BILITOT 0.5 02/26/2018 0936   BILIDIR 0.0 12/31/2017 0926      Component Value Date/Time   TSH 2.540 02/26/2018 0936   TSH 1.78 12/31/2017 0926   TSH 1.12 10/01/2013 1056   Results for Flammer, Lova L (MRN [id number]) as of 06/03/2018 11:07  Ref. Range 02/26/2018 09:36  Vitamin D, 25-Hydroxy Latest Ref Range: 30.0 - 100.0 ng/mL 11.1 (L)   ASSESSMENT AND PLAN: Essential hypertension  Class 3 severe obesity with serious comorbidity and body mass index (BMI) of 50.0 to 59.9 in adult, unspecified obesity type (HCC)  PLAN:  Hypertension We discussed sodium restriction, working on healthy weight loss, and a regular exercise program as the means to achieve improved blood pressure control. Kim Barnes agreed with this plan and agreed to follow up as directed. We will continue to monitor her blood pressure as well as her progress with the above lifestyle modifications. She will continue her medications as prescribed and will watch for  signs of hypotension as she continues her lifestyle modifications. We will recheck blood pressure in 3 weeks.  We spent > than 50% of the 15 minute visit on the counseling as documented in the note.  Obesity Kim Barnes is currently in the action stage of change. As such, her goal is to continue with weight loss efforts She has agreed to change to keeping a food journal with 1100 to 1400 calories and 75+ grams of protein daily Kim Barnes has been instructed to work up to a goal of 150 minutes of combined cardio and strengthening exercise per week for weight loss and overall health benefits. We discussed the following Behavioral Modification Strategies today: increase H2O intake, increasing lean protein intake, increasing vegetables and decreasing sodium intake  Kim Barnes has agreed to follow up with our clinic in 3 weeks. She was informed of the importance of frequent follow up visits to maximize her success with intensive lifestyle modifications for her multiple health conditions.   OBESITY BEHAVIORAL INTERVENTION VISIT  Today's visit was # 7 out of 22.  Starting weight: 378 lbs Starting date: 02/26/18 Today's weight : 357 lbs  Today's date: 06/03/2018 Total lbs lost to date: 21 (Patients must lose 7 lbs in the first 6 months to continue with counseling)   ASK: We discussed the diagnosis of obesity with Kim Barnes today and Kim Barnes agreed to give [name] permission to discuss obesity behavioral modification therapy today.  ASSESS: Kim Barnes has the diagnosis of obesity and her BMI today is 57.65 Kim Barnes is in the action stage of change   ADVISE: Kim Barnes was educated on the multiple health risks of obesity as well as the benefit of weight loss to improve her health. She was advised of the need for long term treatment and the  importance of lifestyle modifications.  AGREE: Multiple dietary modification options and treatment options were discussed and  Kim Barnes agreed to the above obesity treatment  plan.  I, [place] Crane, am acting as transcriptionist for Quillian Quince, MD  I have reviewed the above documentation for accuracy and completeness, and I agree with the above. -Quillian Quince, MD

## 2018-06-08 ENCOUNTER — Other Ambulatory Visit (INDEPENDENT_AMBULATORY_CARE_PROVIDER_SITE_OTHER): Payer: Self-pay | Admitting: Family Medicine

## 2018-06-08 DIAGNOSIS — E559 Vitamin D deficiency, unspecified: Secondary | ICD-10-CM

## 2018-06-09 ENCOUNTER — Other Ambulatory Visit (INDEPENDENT_AMBULATORY_CARE_PROVIDER_SITE_OTHER): Payer: Self-pay | Admitting: Family Medicine

## 2018-06-09 DIAGNOSIS — E559 Vitamin D deficiency, unspecified: Secondary | ICD-10-CM

## 2018-06-14 ENCOUNTER — Other Ambulatory Visit (INDEPENDENT_AMBULATORY_CARE_PROVIDER_SITE_OTHER): Payer: Self-pay | Admitting: Family Medicine

## 2018-06-14 DIAGNOSIS — F3289 Other specified depressive episodes: Secondary | ICD-10-CM

## 2018-06-23 ENCOUNTER — Ambulatory Visit (INDEPENDENT_AMBULATORY_CARE_PROVIDER_SITE_OTHER): Payer: 59 | Admitting: Family Medicine

## 2018-06-23 VITALS — BP 143/89 | HR 65 | Temp 98.2°F | Ht 66.0 in | Wt 357.0 lb

## 2018-06-23 DIAGNOSIS — R7303 Prediabetes: Secondary | ICD-10-CM

## 2018-06-23 DIAGNOSIS — F3289 Other specified depressive episodes: Secondary | ICD-10-CM

## 2018-06-23 DIAGNOSIS — E559 Vitamin D deficiency, unspecified: Secondary | ICD-10-CM

## 2018-06-23 DIAGNOSIS — I10 Essential (primary) hypertension: Secondary | ICD-10-CM | POA: Diagnosis not present

## 2018-06-23 DIAGNOSIS — Z6841 Body Mass Index (BMI) 40.0 and over, adult: Secondary | ICD-10-CM

## 2018-06-23 DIAGNOSIS — Z9189 Other specified personal risk factors, not elsewhere classified: Secondary | ICD-10-CM | POA: Diagnosis not present

## 2018-06-23 MED ORDER — METFORMIN HCL 500 MG PO TABS: 500.0000 mg | ORAL_TABLET | Freq: Every day | ORAL | 0 refills | Status: DC

## 2018-06-23 MED ORDER — AMLODIPINE BESYLATE 5 MG PO TABS: 5.0000 mg | ORAL_TABLET | Freq: Every day | ORAL | 0 refills | Status: DC

## 2018-06-23 MED ORDER — VITAMIN D (ERGOCALCIFEROL) 1.25 MG (50000 UNIT) PO CAPS: ORAL_CAPSULE | ORAL | 0 refills | Status: DC

## 2018-06-23 MED ORDER — BUPROPION HCL ER (SR) 150 MG PO TB12: 150.0000 mg | ORAL_TABLET | Freq: Every day | ORAL | 0 refills | Status: DC

## 2018-06-24 NOTE — Progress Notes (Signed)
Office: [phone]  /  Fax: [phone]   HPI:   Chief Complaint: OBESITY Kim Barnes is here to discuss her progress with her obesity treatment plan. She is on the keep a food journal with 1100 to 1400 calories and 75+ grams of protein daily and is following her eating plan approximately 50 % of the time. She states she is exercising 0 minutes 0 times per week. Kim Barnes has done well maintaining weight for the last three weeks, but she is still struggling with journaling. She is not eating enough vegetables or protein and simple carbohydrates are starting to increase. Her weight is (!) 357 lb (161.9 kg) today and has maintained weight over a period of 3 weeks since her last visit. She has lost 21 lbs since starting treatment with [name].  Hypertension Kim Barnes is a 42 y.o. female with hypertension. She is forgetting her medications more often and her blood pressure is elevated. Kim Barnes denies chest pain, lightheadedness or shortness of breath on exertion. She is working weight loss to help control her blood pressure with the goal of decreasing her risk of heart attack and stroke. Kim Barnes blood pressure is not currently controlled.  Pre-Diabetes Kim Barnes has a diagnosis of prediabetes based on her elevated Hgb A1c and was informed this puts her at greater risk of developing diabetes. She is stable on metformin. Kim Barnes continues to work on diet and exercise to decrease risk of diabetes. She denies nausea, vomiting or hypoglycemia.  At risk for diabetes Kim Barnes is at higher than average risk for developing diabetes due to her obesity. She currently denies polyuria or polydipsia.  Vitamin Kim Barnes deficiency Kim Barnes has a diagnosis of vitamin Kim Barnes deficiency. She is stable on vit Kim Barnes but she is not yet at goal. Kim Barnes denies nausea, vomiting or muscle weakness.  Depression with emotional eating behaviors Kim Barnes is struggling with emotional eating and using food for comfort to the extent that it is  negatively impacting her health. She often snacks when she is not hungry. Kim Barnes sometimes feels she is out of control and then feels guilty that she made poor food choices. She has been working on behavior modification techniques to help reduce her emotional eating and has been somewhat successful. Her mood is stable on Wellbutrin. Her blood pressure is elevated but she didn't take her medications this morning She has no insomnia and shows no sign of suicidal or homicidal ideations.  Depression screen PHQ 2/9 02/26/2018  Decreased Interest 2  Down, Depressed, Hopeless 1  PHQ - 2 Score 3  Altered sleeping 2  Tired, decreased energy 2  Change in appetite 2  Feeling bad or failure about yourself  2  Trouble concentrating 1  Moving slowly or fidgety/restless 1  Suicidal thoughts 0  PHQ-9 Score 13  Difficult doing work/chores Somewhat difficult     ALLERGIES: No Known Allergies  MEDICATIONS: Current Outpatient Medications on File Prior to Visit  Medication Sig Dispense Refill  . ibuprofen (ADVIL,MOTRIN) 200 MG tablet Take 1,200 mg by mouth daily as needed for moderate pain (tooth pain).    . meloxicam (MOBIC) 15 MG tablet Take 1 tablet (15 mg total) by mouth daily as needed for pain. 30 tablet 1  . Nebivolol HCl (BYSTOLIC) 20 MG TABS Take 1 tablet (20 mg total) by mouth daily. 30 tablet 11  . triamterene-hydrochlorothiazide (MAXZIDE-25) 37.5-25 MG tablet TAKE 1 TABLET BY MOUTH DAILY. 30 tablet 11   No current facility-administered medications on file prior to visit.  PAST MEDICAL HISTORY: Past Medical History:  Diagnosis Date  . Anxiety   . Asthma    prn inhaler  . Back pain   . Bell's palsy   . Carpal tunnel syndrome of left wrist 01/2013  . Chest pain   . Cold hands and feet   . Cough   . Depression   . Dry mouth   . Dry skin   . Fatigue   . GERD (gastroesophageal reflux disease)    occasional; no current med.  [name] History of brain tumor 1996   no neurologic deficits    . HTN (hypertension)    under control with med., has been on med. x 5 yr.  . Joint pain   . Obesity   . Obstructive sleep apnea   . PCOS (polycystic ovarian syndrome)   . Polycystic ovarian syndrome    no current med.; irregular periods  . Shortness of breath   . Stress   . Swelling of extremity   . Wheezing     PAST SURGICAL HISTORY: Past Surgical History:  Procedure Laterality Date  . BRAIN SURGERY  1996   exc. tumor  . CARPAL TUNNEL RELEASE Left 02/10/2013   Procedure: CARPAL TUNNEL RELEASE;  Surgeon: Nicki Reaper, MD;  Location: [facility];  Service: Orthopedics;  Laterality: Left;  ANESTHESIA: IV REGIONAL FAB  . HYSTEROSCOPY W/Kim Barnes&C  02/21/2006  . HYSTEROSCOPY W/Kim Barnes&C  12/17/2011   Procedure: DILATATION AND CURETTAGE /HYSTEROSCOPY;  Surgeon: Meriel Pica, MD;  Location: [facility];  Service: Gynecology;  Laterality: N/A;  . LEEP  01/07/2004  . TONSILLECTOMY  09/21/2002    SOCIAL HISTORY: Social History   Tobacco Use  . Smoking status: Never Smoker  . Smokeless tobacco: Never Used  Substance Use Topics  . Alcohol use: Yes    Comment: rare; socially  . Drug use: No    FAMILY HISTORY: Family History  Problem Relation Age of Onset  . Hypertension Other   . Lung cancer Father        Smoker  . Cancer Father        lung/ smoker  . Heart disease Father   . Alcoholism Father   . Breast cancer Maternal Grandmother   . Cancer Maternal Grandmother        breast  . Emphysema Paternal Grandmother   . Asthma Mother   . Hyperlipidemia Mother   . Hypertension Mother   . Sleep apnea Mother   . Asthma Maternal Aunt     ROS: Review of Systems  Constitutional: Negative for weight loss.  Respiratory: Negative for shortness of breath (on exertion).   Cardiovascular: Negative for chest pain.  Gastrointestinal: Negative for nausea and vomiting.  Genitourinary: Negative for frequency.  Musculoskeletal:       Negative for muscle weakness  Neurological:        Negative for lightheadedness  Endo/Heme/Allergies: Negative for polydipsia.       Negative for hypoglycemia  Psychiatric/Behavioral: Positive for depression. Negative for suicidal ideas. The patient does not have insomnia.     PHYSICAL EXAM: Blood pressure (!) 143/89, pulse 65, temperature 98.2 F (36.8 C), temperature source Oral, height 5\' 6"  (1.676 m), weight (!) 357 lb (161.9 kg), SpO2 100 %. Body mass index is 57.62 kg/m. Physical Exam  Constitutional: She is oriented to person, place, and time. She appears well-developed and well-nourished.  Cardiovascular: Normal rate.  Pulmonary/Chest: Effort normal.  Musculoskeletal: Normal range of motion.  Neurological: She is oriented to  person, place, and time.  Skin: Skin is warm and dry.  Psychiatric: She has a normal mood and affect. Her behavior is normal.  Vitals reviewed.   RECENT LABS AND TESTS: BMET    Component Value Date/Time   NA 143 02/26/2018 0936   K 3.9 02/26/2018 0936   CL 103 02/26/2018 0936   CO2 26 02/26/2018 0936   GLUCOSE 87 02/26/2018 0936   GLUCOSE 103 (H) 12/31/2017 0926   GLUCOSE 83 10/08/2006 1418   BUN 7 02/26/2018 0936   CREATININE 0.78 02/26/2018 0936   CALCIUM 9.2 02/26/2018 0936   GFRNONAA 96 02/26/2018 0936   GFRAA 111 02/26/2018 0936   Lab Results  Component Value Date   HGBA1C 5.8 (H) 02/26/2018   HGBA1C 5.3 03/31/2015   HGBA1C 6.0 04/20/2011   HGBA1C 5.9 05/24/2010   HGBA1C 5.7 10/05/2008   Lab Results  Component Value Date   INSULIN 26.4 (H) 02/26/2018   CBC    Component Value Date/Time   WBC 5.4 02/26/2018 0936   WBC 5.7 12/31/2017 0926   RBC 4.74 02/26/2018 0936   RBC 4.82 12/31/2017 0926   HGB 13.6 02/26/2018 0936   HCT 42.2 02/26/2018 0936   PLT 281.0 12/31/2017 0926   MCV 89 02/26/2018 0936   MCH 28.7 02/26/2018 0936   MCH 29.3 02/04/2017 1851   MCHC 32.2 02/26/2018 0936   MCHC 33.8 12/31/2017 0926   RDW 14.7 02/26/2018 0936   LYMPHSABS 2.1 02/26/2018 0936    MONOABS 0.3 12/31/2017 0926   EOSABS 0.1 02/26/2018 0936   BASOSABS 0.0 02/26/2018 0936   Iron/TIBC/Ferritin/ %Sat No results found for: IRON, TIBC, FERRITIN, IRONPCTSAT Lipid Panel     Component Value Date/Time   CHOL 216 (H) 02/26/2018 0936   TRIG 85 02/26/2018 0936   HDL 47 02/26/2018 0936   CHOLHDL 4 12/31/2017 0926   VLDL 17.2 12/31/2017 0926   LDLCALC 152 (H) 02/26/2018 0936   LDLDIRECT 149.4 05/24/2010 0904   Hepatic Function Panel     Component Value Date/Time   PROT 7.6 02/26/2018 0936   ALBUMIN 4.4 02/26/2018 0936   AST 20 02/26/2018 0936   ALT 18 02/26/2018 0936   ALKPHOS 69 02/26/2018 0936   BILITOT 0.5 02/26/2018 0936   BILIDIR 0.0 12/31/2017 0926      Component Value Date/Time   TSH 2.540 02/26/2018 0936   TSH 1.78 12/31/2017 0926   TSH 1.12 10/01/2013 1056   Results for Iser, Aryona L (MRN [id number]) as of 06/24/2018 08:46  Ref. Range 02/26/2018 09:36  Vitamin Kim Barnes, 25-Hydroxy Latest Ref Range: 30.0 - 100.0 ng/mL 11.1 (L)   ASSESSMENT AND PLAN: Essential hypertension - Plan: amLODipine (NORVASC) 5 MG tablet  Prediabetes - Plan: metFORMIN (GLUCOPHAGE) 500 MG tablet  Vitamin Kim Barnes deficiency - Plan: Vitamin Kim Barnes, Ergocalciferol, (DRISDOL) 50000 units CAPS capsule  Other depression - with emotional eating - Plan: buPROPion (WELLBUTRIN SR) 150 MG 12 hr tablet  At risk for diabetes mellitus  Class 3 severe obesity with serious comorbidity and body mass index (BMI) of 50.0 to 59.9 in adult, unspecified obesity type (HCC)  PLAN:  Hypertension We discussed sodium restriction, working on healthy weight loss, and a regular exercise program as the means to achieve improved blood pressure control. Kim Barnes agreed with this plan and agreed to follow up as directed. We will recheck blood pressure in 2 weeks and will continue to monitor her blood pressure as well as her progress with the above lifestyle modifications. She agrees to  continue amlodipine 5 mg qd #30 with no  refills and will watch for signs of hypotension as she continues her lifestyle modifications.  Pre-Diabetes Chakira will continue to work on weight loss, exercise, and decreasing simple carbohydrates in her diet to help decrease the risk of diabetes. We dicussed metformin including benefits and risks. She was informed that eating too many simple carbohydrates or too many calories at one sitting increases the likelihood of GI side effects. Kim Barnes requested metformin for now and a prescription was written today for 1 month refill. Kim Barnes agreed to follow up with [name] as directed to monitor her progress.  Diabetes risk counseling Kim Barnes was given extended (15 minutes) diabetes prevention counseling today. She is 42 y.o. female and has risk factors for diabetes including obesity and pre-diabetes. We discussed intensive lifestyle modifications today with an emphasis on weight loss as well as increasing exercise and decreasing simple carbohydrates in her diet.  Vitamin Kim Barnes Deficiency Kim Barnes was informed that low vitamin Kim Barnes levels contributes to fatigue and are associated with obesity, breast, and colon cancer. She agrees to continue to take prescription Vit Kim Barnes @50 ,000 IU every week #4 with no refills and will follow up for routine testing of vitamin Kim Barnes, at least 2-3 times per year. She was informed of the risk of over-replacement of vitamin Kim Barnes and agrees to not increase her dose unless she discusses this with [name] first. Kim Barnes agrees to follow up as directed.  Depression with Emotional Eating Behaviors We discussed behavior modification techniques today to help Kim Barnes deal with her emotional eating and depression. She has agreed to take Wellbutrin SR 150 mg qd #30 with no refills and follow up as directed. We will recheck blood pressure in 2 weeks.  Obesity Kim Barnes is currently in the action stage of change. As such, her goal is to continue with weight loss efforts She has agreed to change to the Category 2 plan  with peanut butter wrap and fairlife milk as an additional breakfast option Kim Barnes has been instructed to work up to a goal of 150 minutes of combined cardio and strengthening exercise per week for weight loss and overall health benefits. We discussed the following Behavioral Modification Strategies today: increasing lean protein intake, work on meal planning and easy cooking plans, travel eating strategies and holiday eating strategies   Savita has agreed to follow up with our clinic in 2 to 3 weeks. She was informed of the importance of frequent follow up visits to maximize her success with intensive lifestyle modifications for her multiple health conditions.   OBESITY BEHAVIORAL INTERVENTION VISIT  Today's visit was # 8 out of 22.  Starting weight: 378 lbs Starting date: 02/26/18 Today's weight : 357 lbs Today's date: 06/23/2018 Total lbs lost to date: 21    ASK: We discussed the diagnosis of obesity with Humna L Neighbors today and Anola agreed to give [name] permission to discuss obesity behavioral modification therapy today.  ASSESS: Islah has the diagnosis of obesity and her BMI today is 57.65 Kasyn is in the action stage of change   ADVISE: Cristela was educated on the multiple health risks of obesity as well as the benefit of weight loss to improve her health. She was advised of the need for long term treatment and the importance of lifestyle modifications.  AGREE: Multiple dietary modification options and treatment options were discussed and  Myelle agreed to the above obesity treatment plan.  Cristi Loron, am acting as transcriptionist for Quillian Quince, MD  I have reviewed the above documentation for accuracy and completeness, and I agree with the above. -Dennard Nip, MD

## 2018-07-07 ENCOUNTER — Ambulatory Visit (INDEPENDENT_AMBULATORY_CARE_PROVIDER_SITE_OTHER): Payer: 59 | Admitting: Physician Assistant

## 2018-07-07 VITALS — BP 120/79 | HR 64 | Temp 97.8°F | Ht 66.0 in | Wt 352.0 lb

## 2018-07-07 DIAGNOSIS — I10 Essential (primary) hypertension: Secondary | ICD-10-CM | POA: Diagnosis not present

## 2018-07-07 DIAGNOSIS — Z6841 Body Mass Index (BMI) 40.0 and over, adult: Secondary | ICD-10-CM

## 2018-07-07 DIAGNOSIS — R7303 Prediabetes: Secondary | ICD-10-CM

## 2018-07-08 NOTE — Progress Notes (Signed)
Office: [phone]  /  Fax: [phone]   HPI:   Chief Complaint: OBESITY Kim Barnes is here to discuss her progress with her obesity treatment plan. She is on the Category 2 plan and is following her eating plan approximately 40 % of the time. She states she is walking 2 1/2 minutes 5 times per week. Kim Barnes did well with weight loss. She reports skipping meals, as she has started a new part time job. She states that she tried journaling breakfast, but did not like it, and that she would rather continue with a structured meal plan. Her weight is (!) 352 lb (159.7 kg) today and has had a weight loss of 5 pounds over a period of 2 weeks since her last visit. She has lost 26 lbs since starting treatment with [name].  Hypertension [name] is a 42 y.o. female with hypertension. [name] denies chest pain or shortness of breath on exertion. She is working weight loss to help control her blood pressure with the goal of decreasing her risk of heart attack and stroke. [name] blood pressure is normal.  Pre-Diabetes Kim Barnes has a diagnosis of prediabetes based on her elevated Hgb A1c and was informed this puts her at greater risk of developing diabetes. She is taking metformin currently and continues to work on diet and exercise to decrease risk of diabetes. She denies nausea, vomiting or diarrhea. She denies polyphagia or hypoglycemia.  ALLERGIES: No Known Allergies  MEDICATIONS: Current Outpatient Medications on File Prior to Visit  Medication Sig Dispense Refill  . amLODipine (NORVASC) 5 MG tablet Take 1 tablet (5 mg total) by mouth daily. 30 tablet 0  . buPROPion (WELLBUTRIN SR) 150 MG 12 hr tablet Take 1 tablet (150 mg total) by mouth daily. 30 tablet 0  . ibuprofen (ADVIL,MOTRIN) 200 MG tablet Take 1,200 mg by mouth daily as needed for moderate pain (tooth pain).    . meloxicam (MOBIC) 15 MG tablet Take 1 tablet (15 mg total) by mouth daily as needed for pain. 30 tablet 1  .  metFORMIN (GLUCOPHAGE) 500 MG tablet Take 1 tablet (500 mg total) by mouth daily with breakfast. 30 tablet 0  . Nebivolol HCl (BYSTOLIC) 20 MG TABS Take 1 tablet (20 mg total) by mouth daily. 30 tablet 11  . triamterene-hydrochlorothiazide (MAXZIDE-25) 37.5-25 MG tablet TAKE 1 TABLET BY MOUTH DAILY. 30 tablet 11  . Vitamin D, Ergocalciferol, (DRISDOL) 50000 units CAPS capsule TAKE ONE CAPSULE BY MOUTH EVERY 7 DAYS 4 capsule 0   No current facility-administered medications on file prior to visit.     PAST MEDICAL HISTORY: Past Medical History:  Diagnosis Date  . Anxiety   . Asthma    prn inhaler  . Back pain   . Bell's palsy   . Carpal tunnel syndrome of left wrist 01/2013  . Chest pain   . Cold hands and feet   . Cough   . Depression   . Dry mouth   . Dry skin   . Fatigue   . GERD (gastroesophageal reflux disease)    occasional; no current med.  [name] History of brain tumor 1996   no neurologic deficits  . HTN (hypertension)    under control with med., has been on med. x 5 yr.  . Joint pain   . Obesity   . Obstructive sleep apnea   . PCOS (polycystic ovarian syndrome)   . Polycystic ovarian syndrome    no current med.; irregular periods  .  Shortness of breath   . Stress   . Swelling of extremity   . Wheezing     PAST SURGICAL HISTORY: Past Surgical History:  Procedure Laterality Date  . BRAIN SURGERY  1996   exc. tumor  . CARPAL TUNNEL RELEASE Left 02/10/2013   Procedure: CARPAL TUNNEL RELEASE;  Surgeon: Nicki Reaper, MD;  Location: [facility];  Service: Orthopedics;  Laterality: Left;  ANESTHESIA: IV REGIONAL FAB  . HYSTEROSCOPY W/D&C  02/21/2006  . HYSTEROSCOPY W/D&C  12/17/2011   Procedure: DILATATION AND CURETTAGE /HYSTEROSCOPY;  Surgeon: Meriel Pica, MD;  Location: [facility];  Service: Gynecology;  Laterality: N/A;  . LEEP  01/07/2004  . TONSILLECTOMY  09/21/2002    SOCIAL HISTORY: Social History   Tobacco Use  . Smoking status: Never Smoker  .  Smokeless tobacco: Never Used  Substance Use Topics  . Alcohol use: Yes    Comment: rare; socially  . Drug use: No    FAMILY HISTORY: Family History  Problem Relation Age of Onset  . Hypertension Other   . Lung cancer Father        Smoker  . Cancer Father        lung/ smoker  . Heart disease Father   . Alcoholism Father   . Breast cancer Maternal Grandmother   . Cancer Maternal Grandmother        breast  . Emphysema Paternal Grandmother   . Asthma Mother   . Hyperlipidemia Mother   . Hypertension Mother   . Sleep apnea Mother   . Asthma Maternal Aunt     ROS: Review of Systems  Constitutional: Positive for weight loss.  Cardiovascular: Negative for chest pain.  Gastrointestinal: Negative for diarrhea, nausea and vomiting.  Endo/Heme/Allergies:       Negative for hypoglycemia Negative for polyphagia    PHYSICAL EXAM: Blood pressure 120/79, pulse 64, temperature 97.8 F (36.6 C), temperature source Oral, height 5\' 6"  (1.676 m), weight (!) 352 lb (159.7 kg), last menstrual period 05/10/2018, SpO2 98 %. Body mass index is 56.81 kg/m. Physical Exam  Constitutional: She is oriented to person, place, and time. She appears well-developed and well-nourished.  Cardiovascular: Normal rate.  Pulmonary/Chest: Effort normal.  Musculoskeletal: Normal range of motion.  Neurological: She is oriented to person, place, and time.  Skin: Skin is warm and dry.  Psychiatric: She has a normal mood and affect. Her behavior is normal.  Vitals reviewed.   RECENT LABS AND TESTS: BMET    Component Value Date/Time   NA 143 02/26/2018 0936   K 3.9 02/26/2018 0936   CL 103 02/26/2018 0936   CO2 26 02/26/2018 0936   GLUCOSE 87 02/26/2018 0936   GLUCOSE 103 (H) 12/31/2017 0926   GLUCOSE 83 10/08/2006 1418   BUN 7 02/26/2018 0936   CREATININE 0.78 02/26/2018 0936   CALCIUM 9.2 02/26/2018 0936   GFRNONAA 96 02/26/2018 0936   GFRAA 111 02/26/2018 0936   Lab Results  Component Value  Date   HGBA1C 5.8 (H) 02/26/2018   HGBA1C 5.3 03/31/2015   HGBA1C 6.0 04/20/2011   HGBA1C 5.9 05/24/2010   HGBA1C 5.7 10/05/2008   Lab Results  Component Value Date   INSULIN 26.4 (H) 02/26/2018   CBC    Component Value Date/Time   WBC 5.4 02/26/2018 0936   WBC 5.7 12/31/2017 0926   RBC 4.74 02/26/2018 0936   RBC 4.82 12/31/2017 0926   HGB 13.6 02/26/2018 0936   HCT 42.2 02/26/2018  0936   PLT 281.0 12/31/2017 0926   MCV 89 02/26/2018 0936   MCH 28.7 02/26/2018 0936   MCH 29.3 02/04/2017 1851   MCHC 32.2 02/26/2018 0936   MCHC 33.8 12/31/2017 0926   RDW 14.7 02/26/2018 0936   LYMPHSABS 2.1 02/26/2018 0936   MONOABS 0.3 12/31/2017 0926   EOSABS 0.1 02/26/2018 0936   BASOSABS 0.0 02/26/2018 0936   Iron/TIBC/Ferritin/ %Sat No results found for: IRON, TIBC, FERRITIN, IRONPCTSAT Lipid Panel     Component Value Date/Time   CHOL 216 (H) 02/26/2018 0936   TRIG 85 02/26/2018 0936   HDL 47 02/26/2018 0936   CHOLHDL 4 12/31/2017 0926   VLDL 17.2 12/31/2017 0926   LDLCALC 152 (H) 02/26/2018 0936   LDLDIRECT 149.4 05/24/2010 0904   Hepatic Function Panel     Component Value Date/Time   PROT 7.6 02/26/2018 0936   ALBUMIN 4.4 02/26/2018 0936   AST 20 02/26/2018 0936   ALT 18 02/26/2018 0936   ALKPHOS 69 02/26/2018 0936   BILITOT 0.5 02/26/2018 0936   BILIDIR 0.0 12/31/2017 0926      Component Value Date/Time   TSH 2.540 02/26/2018 0936   TSH 1.78 12/31/2017 0926   TSH 1.12 10/01/2013 1056   Results for Guadalupe, Corinna L (MRN [id number]) as of 07/08/2018 11:34  Ref. Range 02/26/2018 09:36  Vitamin D, 25-Hydroxy Latest Ref Range: 30.0 - 100.0 ng/mL 11.1 (L)   ASSESSMENT AND PLAN: Essential hypertension  Prediabetes  Class 3 severe obesity with serious comorbidity and body mass index (BMI) of 50.0 to 59.9 in adult, unspecified obesity type (HCC)  PLAN:  Hypertension We discussed sodium restriction, working on healthy weight loss, and a regular exercise program as  the means to achieve improved blood pressure control. Fawn agreed with this plan and agreed to follow up as directed. We will continue to monitor her blood pressure as well as her progress with the above lifestyle modifications. She will continue her medications as prescribed and will watch for signs of hypotension as she continues her lifestyle modifications.  Pre-Diabetes Kashawn will continue to work on weight loss, exercise, and decreasing simple carbohydrates in her diet to help decrease the risk of diabetes. We dicussed metformin including benefits and risks. She was informed that eating too many simple carbohydrates or too many calories at one sitting increases the likelihood of GI side effects. Paisley will continue metformin for now and a prescription was not written today. Lovenia agreed to follow up with us as directed to monitor her progress.   We spent > than 50% of the 15 minute visit on the counseling as documented in the note.  Obesity Dayzha is currently in the action stage of change. As such, her goal is to continue with weight loss efforts She has agreed to change to the Pescatarian eating plan Denai has been instructed to work up to a goal of 150 minutes of combined cardio and strengthening exercise per week for weight loss and overall health benefits. We discussed the following Behavioral Modification Strategies today: increasing lean protein intake and no skipping meals  Jennalynn has agreed to follow up with our clinic in 2 to 3 weeks. She was informed of the importance of frequent follow up visits to maximize her success with intensive lifestyle modifications for her multiple health conditions.   OBESITY BEHAVIORAL INTERVENTION VISIT  Today's visit was # 9 out of 22.  Starting weight: 378 lbs Starting date: 02/26/18 Today's weight : 352 lbs Today's date: 07/07/2018  Total lbs lost to date: 36    ASK: We discussed the diagnosis of obesity with Lizzeth L Arcos today and  Kalissa agreed to give [name] permission to discuss obesity behavioral modification therapy today.  ASSESS: Athaliah has the diagnosis of obesity and her BMI today is 56.84 Urvi is in the action stage of change   ADVISE: Hansika was educated on the multiple health risks of obesity as well as the benefit of weight loss to improve her health. She was advised of the need for long term treatment and the importance of lifestyle modifications.  AGREE: Multiple dietary modification options and treatment options were discussed and  Khyra agreed to the above obesity treatment plan.  Cristi Loron, am acting as transcriptionist for Alois Cliche, PA-C I, Alois Cliche, PA-C have reviewed above note and agree with its content

## 2018-07-28 ENCOUNTER — Ambulatory Visit (INDEPENDENT_AMBULATORY_CARE_PROVIDER_SITE_OTHER): Payer: 59 | Admitting: Physician Assistant

## 2018-07-28 VITALS — BP 133/91 | HR 62 | Temp 98.0°F | Ht 66.0 in | Wt 351.0 lb

## 2018-07-28 DIAGNOSIS — Z9189 Other specified personal risk factors, not elsewhere classified: Secondary | ICD-10-CM | POA: Diagnosis not present

## 2018-07-28 DIAGNOSIS — I10 Essential (primary) hypertension: Secondary | ICD-10-CM | POA: Diagnosis not present

## 2018-07-28 DIAGNOSIS — R7303 Prediabetes: Secondary | ICD-10-CM

## 2018-07-28 DIAGNOSIS — E7849 Other hyperlipidemia: Secondary | ICD-10-CM

## 2018-07-28 DIAGNOSIS — E559 Vitamin D deficiency, unspecified: Secondary | ICD-10-CM | POA: Diagnosis not present

## 2018-07-28 DIAGNOSIS — Z6841 Body Mass Index (BMI) 40.0 and over, adult: Secondary | ICD-10-CM

## 2018-07-28 MED ORDER — VITAMIN D (ERGOCALCIFEROL) 1.25 MG (50000 UNIT) PO CAPS: ORAL_CAPSULE | ORAL | 0 refills | Status: DC

## 2018-07-28 MED ORDER — TRIAMTERENE-HCTZ 37.5-25 MG PO TABS: 1.0000 | ORAL_TABLET | Freq: Every day | ORAL | 11 refills | Status: DC

## 2018-07-28 MED ORDER — AMLODIPINE BESYLATE 5 MG PO TABS: 5.0000 mg | ORAL_TABLET | Freq: Every day | ORAL | 0 refills | Status: DC

## 2018-07-28 NOTE — Progress Notes (Signed)
Office: [phone]  /  Fax: [phone]   HPI:   Chief Complaint: OBESITY Kim Barnes is here to discuss her progress with her obesity treatment plan. She is on the Pescatarian eating plan and is following her eating plan approximately 30 % of the time. She states she is exercising 0 minutes 0 times per week. Kim Barnes did well with loss. She reports that she is getting bored with the plan, but wants to remain on the structured plan for now. She is traveling to [facility] for vacation next week. Her weight is (!) 351 lb (159.2 kg) today and has had a weight loss of 1 pound over a period of 3 weeks since her last visit. She has lost 27 lbs since starting treatment with [name].  Hypertension Kim Barnes is a 42 y.o. female with hypertension. Kim Barnes denies chest pain. She is working weight loss to help control her blood pressure with the goal of decreasing her risk of heart attack and stroke. Kim Barnes blood pressure is normal today.  Vitamin D deficiency Kim Barnes has a diagnosis of vitamin D deficiency. She is currently taking prescription vit D and denies nausea, vomiting or muscle weakness.  At risk for osteopenia and osteoporosis Kim Barnes is at higher risk of osteopenia and osteoporosis due to vitamin D deficiency.   Hyperlipidemia Kim Barnes has hyperlipidemia and is not on medications. She has been trying to improve her cholesterol levels with intensive lifestyle modification including a low saturated fat diet, exercise and weight loss. She denies any chest pain.,  Pre-Diabetes Kim Barnes has a diagnosis of prediabetes based on her elevated Hgb A1c and was informed this puts her at greater risk of developing diabetes. She is taking metformin currently and continues to work on diet and exercise to decrease risk of diabetes. She denies nausea, polyphagia or hypoglycemia.  ALLERGIES: No Known Allergies  MEDICATIONS: Current Outpatient Medications on File Prior to Visit  Medication Sig Dispense Refill    . buPROPion (WELLBUTRIN SR) 150 MG 12 hr tablet Take 1 tablet (150 mg total) by mouth daily. 30 tablet 0  . ibuprofen (ADVIL,MOTRIN) 200 MG tablet Take 1,200 mg by mouth daily as needed for moderate pain (tooth pain).    . meloxicam (MOBIC) 15 MG tablet Take 1 tablet (15 mg total) by mouth daily as needed for pain. 30 tablet 1  . metFORMIN (GLUCOPHAGE) 500 MG tablet Take 1 tablet (500 mg total) by mouth daily with breakfast. 30 tablet 0  . Nebivolol HCl (BYSTOLIC) 20 MG TABS Take 1 tablet (20 mg total) by mouth daily. 30 tablet 11   No current facility-administered medications on file prior to visit.     PAST MEDICAL HISTORY: Past Medical History:  Diagnosis Date  . Anxiety   . Asthma    prn inhaler  . Back pain   . Bell's palsy   . Carpal tunnel syndrome of left wrist 01/2013  . Chest pain   . Cold hands and feet   . Cough   . Depression   . Dry mouth   . Dry skin   . Fatigue   . GERD (gastroesophageal reflux disease)    occasional; no current med.  [name] History of brain tumor 1996   no neurologic deficits  . HTN (hypertension)    under control with med., has been on med. x 5 yr.  . Joint pain   . Obesity   . Obstructive sleep apnea   . PCOS (polycystic ovarian syndrome)   . Polycystic  ovarian syndrome    no current med.; irregular periods  . Shortness of breath   . Stress   . Swelling of extremity   . Wheezing     PAST SURGICAL HISTORY: Past Surgical History:  Procedure Laterality Date  . BRAIN SURGERY  1996   exc. tumor  . CARPAL TUNNEL RELEASE Left 02/10/2013   Procedure: CARPAL TUNNEL RELEASE;  Surgeon: Nicki Reaper, MD;  Location: [facility];  Service: Orthopedics;  Laterality: Left;  ANESTHESIA: IV REGIONAL FAB  . HYSTEROSCOPY W/D&C  02/21/2006  . HYSTEROSCOPY W/D&C  12/17/2011   Procedure: DILATATION AND CURETTAGE /HYSTEROSCOPY;  Surgeon: Meriel Pica, MD;  Location: [facility];  Service: Gynecology;  Laterality: N/A;  . LEEP  01/07/2004  .  TONSILLECTOMY  09/21/2002    SOCIAL HISTORY: Social History   Tobacco Use  . Smoking status: Never Smoker  . Smokeless tobacco: Never Used  Substance Use Topics  . Alcohol use: Yes    Comment: rare; socially  . Drug use: No    FAMILY HISTORY: Family History  Problem Relation Age of Onset  . Hypertension Other   . Lung cancer Father        Smoker  . Cancer Father        lung/ smoker  . Heart disease Father   . Alcoholism Father   . Breast cancer Maternal Grandmother   . Cancer Maternal Grandmother        breast  . Emphysema Paternal Grandmother   . Asthma Mother   . Hyperlipidemia Mother   . Hypertension Mother   . Sleep apnea Mother   . Asthma Maternal Aunt     ROS: Review of Systems  Constitutional: Positive for weight loss.  Cardiovascular: Negative for chest pain.  Gastrointestinal: Negative for nausea and vomiting.  Musculoskeletal:       Negative for muscle weakness  Endo/Heme/Allergies:       Negative for polyphagia Negative for hypoglycemia    PHYSICAL EXAM: Blood pressure (!) 133/91, pulse 62, temperature 98 F (36.7 C), temperature source Oral, height 5\' 6"  (1.676 m), weight (!) 351 lb (159.2 kg), last menstrual period 07/22/2018, SpO2 97 %. Body mass index is 56.65 kg/m. Physical Exam  Constitutional: She is oriented to person, place, and time. She appears well-developed and well-nourished.  Cardiovascular: Normal rate.  Pulmonary/Chest: Effort normal.  Musculoskeletal: Normal range of motion.  Neurological: She is oriented to person, place, and time.  Skin: Skin is warm and dry.  Psychiatric: She has a normal mood and affect. Her behavior is normal.  Vitals reviewed.   RECENT LABS AND TESTS: BMET    Component Value Date/Time   NA 143 02/26/2018 0936   K 3.9 02/26/2018 0936   CL 103 02/26/2018 0936   CO2 26 02/26/2018 0936   GLUCOSE 87 02/26/2018 0936   GLUCOSE 103 (H) 12/31/2017 0926   GLUCOSE 83 10/08/2006 1418   BUN 7 02/26/2018  0936   CREATININE 0.78 02/26/2018 0936   CALCIUM 9.2 02/26/2018 0936   GFRNONAA 96 02/26/2018 0936   GFRAA 111 02/26/2018 0936   Lab Results  Component Value Date   HGBA1C 5.8 (H) 02/26/2018   HGBA1C 5.3 03/31/2015   HGBA1C 6.0 04/20/2011   HGBA1C 5.9 05/24/2010   HGBA1C 5.7 10/05/2008   Lab Results  Component Value Date   INSULIN 26.4 (H) 02/26/2018   CBC    Component Value Date/Time   WBC 5.4 02/26/2018 0936   WBC 5.7 12/31/2017  0926   RBC 4.74 02/26/2018 0936   RBC 4.82 12/31/2017 0926   HGB 13.6 02/26/2018 0936   HCT 42.2 02/26/2018 0936   PLT 281.0 12/31/2017 0926   MCV 89 02/26/2018 0936   MCH 28.7 02/26/2018 0936   MCH 29.3 02/04/2017 1851   MCHC 32.2 02/26/2018 0936   MCHC 33.8 12/31/2017 0926   RDW 14.7 02/26/2018 0936   LYMPHSABS 2.1 02/26/2018 0936   MONOABS 0.3 12/31/2017 0926   EOSABS 0.1 02/26/2018 0936   BASOSABS 0.0 02/26/2018 0936   Iron/TIBC/Ferritin/ %Sat No results found for: IRON, TIBC, FERRITIN, IRONPCTSAT Lipid Panel     Component Value Date/Time   CHOL 216 (H) 02/26/2018 0936   TRIG 85 02/26/2018 0936   HDL 47 02/26/2018 0936   CHOLHDL 4 12/31/2017 0926   VLDL 17.2 12/31/2017 0926   LDLCALC 152 (H) 02/26/2018 0936   LDLDIRECT 149.4 05/24/2010 0904   Hepatic Function Panel     Component Value Date/Time   PROT 7.6 02/26/2018 0936   ALBUMIN 4.4 02/26/2018 0936   AST 20 02/26/2018 0936   ALT 18 02/26/2018 0936   ALKPHOS 69 02/26/2018 0936   BILITOT 0.5 02/26/2018 0936   BILIDIR 0.0 12/31/2017 0926      Component Value Date/Time   TSH 2.540 02/26/2018 0936   TSH 1.78 12/31/2017 0926   TSH 1.12 10/01/2013 1056   Results for Lundberg, Amyra L (MRN [id number]) as of 07/28/2018 11:00  Ref. Range 02/26/2018 09:36  Vitamin D, 25-Hydroxy Latest Ref Range: 30.0 - 100.0 ng/mL 11.1 (L)   ASSESSMENT AND PLAN: Essential hypertension - Plan: amLODipine (NORVASC) 5 MG tablet, triamterene-hydrochlorothiazide (MAXZIDE-25) 37.5-25 MG  tablet  Vitamin D deficiency - Plan: VITAMIN D 25 Hydroxy (Vit-D Deficiency, Fractures), Vitamin D, Ergocalciferol, (DRISDOL) 50000 units CAPS capsule  Other hyperlipidemia - Plan: Lipid Panel With LDL/HDL Ratio  Prediabetes - Plan: Comprehensive metabolic panel, Hemoglobin A1c, Insulin, random  At risk for osteoporosis  Class 3 severe obesity with serious comorbidity and body mass index (BMI) of 50.0 to 59.9 in adult, unspecified obesity type (HCC)  PLAN:  Hypertension We discussed sodium restriction, working on healthy weight loss, and a regular exercise program as the means to achieve improved blood pressure control. Kim Barnes agreed with this plan and agreed to follow up as directed. We will continue to monitor her blood pressure as well as her progress with the above lifestyle modifications. She agrees to continue amlodipine 5 mg qd #30 with no refills and triamterene-HCTZ 37.5-25 mg qd #30 with no refills and will watch for signs of hypotension as she continues her lifestyle modifications.  Vitamin D Deficiency Kim Barnes was informed that low vitamin D levels contributes to fatigue and are associated with obesity, breast, and colon cancer. She agrees to continue to take prescription Vit D @50 ,000 IU every week #4 with no refills and will follow up for routine testing of vitamin D, at least 2-3 times per year. She was informed of the risk of over-replacement of vitamin D and agrees to not increase her dose unless she discusses this with us first. Yalexa agrees to follow up as directed.  At risk for osteopenia and osteoporosis Kim Barnes is at risk for osteopenia and osteoporosis due to her vitamin D deficiency. She was encouraged to take her vitamin D and follow her higher calcium diet and increase strengthening exercise to help strengthen her bones and decrease her risk of osteopenia and osteoporosis.  Hyperlipidemia Kim Barnes was informed of the American Heart Association Guidelines  emphasizing  intensive lifestyle modifications as the first line treatment for hyperlipidemia. We discussed many lifestyle modifications today in depth, and Anesia will continue to work on decreasing saturated fats such as fatty red meat, butter and many fried foods. She will also increase vegetables and lean protein in her diet and continue to work on exercise and weight loss efforts. We will check labs and Kim Barnes will follow up at the agreed upon time.  Pre-Diabetes Kim Barnes will continue to work on weight loss, exercise, and decreasing simple carbohydrates in her diet to help decrease the risk of diabetes. We dicussed metformin including benefits and risks. She was informed that eating too many simple carbohydrates or too many calories at one sitting increases the likelihood of GI side effects. Kim Barnes will continue metformin for now and a prescription was not written today. Kim Barnes agreed to follow up with us as directed to monitor her progress.  Obesity Kim Barnes is currently in the action stage of change. As such, her goal is to continue with weight loss efforts She has agreed to follow the Category 2 plan Kim Barnes has been instructed to work up to a goal of 150 minutes of combined cardio and strengthening exercise per week for weight loss and overall health benefits. We discussed the following Behavioral Modification Strategies today: increasing lean protein intake and work on meal planning and easy cooking plans  Kim Barnes has agreed to follow up with our clinic in 2 to 3 weeks. She was informed of the importance of frequent follow up visits to maximize her success with intensive lifestyle modifications for her multiple health conditions.   OBESITY BEHAVIORAL INTERVENTION VISIT  Today's visit was # 10 out of 22.  Starting weight: 378 lbs Starting date: 02/26/18 Today's weight : 351 lbs  Today's date: 07/28/2018 Total lbs lost to date: 6327    ASK: We discussed the diagnosis of obesity with Kim Barnes  today and Kim Barnes agreed to give us permission to discuss obesity behavioral modification therapy today.  ASSESS: Kim Barnes has the diagnosis of obesity and her BMI today is 56.68 Teila is in the action stage of change   ADVISE: Ivannah was educated on the multiple health risks of obesity as well as the benefit of weight loss to improve her health. She was advised of the need for long term treatment and the importance of lifestyle modifications.  AGREE: Multiple dietary modification options and treatment options were discussed and  Hilde agreed to the above obesity treatment plan.  Cristi LoronI, Joanne Murray, am acting as transcriptionist for Alois Clicheracey Jordynn Perrier, PA-C I, Alois Clicheracey Albi Rappaport, PA-C have reviewed above note and agree with its content

## 2018-07-29 LAB — COMPREHENSIVE METABOLIC PANEL
A/G RATIO: 1.4 (ref 1.2–2.2)
ALBUMIN: 4 g/dL (ref 3.5–5.5)
ALT: 16 IU/L (ref 0–32)
AST: 18 IU/L (ref 0–40)
Alkaline Phosphatase: 56 IU/L (ref 39–117)
BUN / CREAT RATIO: 12 (ref 9–23)
BUN: 12 mg/dL (ref 6–24)
Bilirubin Total: 0.4 mg/dL (ref 0.0–1.2)
CALCIUM: 9.2 mg/dL (ref 8.7–10.2)
CO2: 24 mmol/L (ref 20–29)
CREATININE: 0.97 mg/dL (ref 0.57–1.00)
Chloride: 103 mmol/L (ref 96–106)
GFR calc Af Amer: 84 mL/min/{1.73_m2} (ref >59)
GFR, EST NON AFRICAN AMERICAN: 73 mL/min/{1.73_m2} (ref >59)
Globulin, Total: 2.8 g/dL (ref 1.5–4.5)
Glucose: 84 mg/dL (ref 65–99)
POTASSIUM: 4.1 mmol/L (ref 3.5–5.2)
SODIUM: 142 mmol/L (ref 134–144)
Total Protein: 6.8 g/dL (ref 6.0–8.5)

## 2018-07-29 LAB — VITAMIN D 25 HYDROXY (VIT D DEFICIENCY, FRACTURES): VIT D 25 HYDROXY: 27.7 ng/mL — AB (ref 30.0–100.0)

## 2018-07-29 LAB — LIPID PANEL WITH LDL/HDL RATIO
Cholesterol, Total: 191 mg/dL (ref 100–199)
HDL: 47 mg/dL (ref >39)
LDL CALC: 126 mg/dL — AB (ref 0–99)
LDL/HDL RATIO: 2.7 ratio (ref 0.0–3.2)
TRIGLYCERIDES: 92 mg/dL (ref 0–149)
VLDL CHOLESTEROL CAL: 18 mg/dL (ref 5–40)

## 2018-07-29 LAB — HEMOGLOBIN A1C
Est. average glucose Bld gHb Est-mCnc: 117 mg/dL
Hgb A1c MFr Bld: 5.7 % — ABNORMAL HIGH (ref 4.8–5.6)

## 2018-07-29 LAB — INSULIN, RANDOM: INSULIN: 14.8 u[IU]/mL (ref 2.6–24.9)

## 2018-08-18 ENCOUNTER — Ambulatory Visit (INDEPENDENT_AMBULATORY_CARE_PROVIDER_SITE_OTHER): Payer: 59 | Admitting: Physician Assistant

## 2018-08-18 VITALS — BP 140/85 | HR 59 | Temp 98.2°F | Ht 66.0 in | Wt 354.0 lb

## 2018-08-18 DIAGNOSIS — Z9189 Other specified personal risk factors, not elsewhere classified: Secondary | ICD-10-CM

## 2018-08-18 DIAGNOSIS — R7303 Prediabetes: Secondary | ICD-10-CM | POA: Diagnosis not present

## 2018-08-18 DIAGNOSIS — E559 Vitamin D deficiency, unspecified: Secondary | ICD-10-CM | POA: Diagnosis not present

## 2018-08-18 DIAGNOSIS — Z6841 Body Mass Index (BMI) 40.0 and over, adult: Secondary | ICD-10-CM

## 2018-08-18 MED ORDER — METFORMIN HCL 500 MG PO TABS: 500.0000 mg | ORAL_TABLET | Freq: Every day | ORAL | 0 refills | Status: DC

## 2018-08-18 MED ORDER — VITAMIN D (ERGOCALCIFEROL) 1.25 MG (50000 UNIT) PO CAPS: ORAL_CAPSULE | ORAL | 0 refills | Status: DC

## 2018-08-18 NOTE — Progress Notes (Signed)
Office: [phone]  /  Fax: [phone]   HPI:   Chief Complaint: OBESITY Kim Barnes is here to discuss her progress with her obesity treatment plan. She is on the Category 2 plan and is following her eating plan approximately 25 % of the time. She states she is exercising 0 minutes 0 times per week. Kim Barnes was on vacation in [place], and she struggled to follow the meal plan. She is ready to start meal planning and to get back on track.  Her weight is (!) 354 lb (160.6 kg) today and has gained 3 pounds since her last visit. She has lost 24 lbs since starting treatment with [name].  Vitamin D Deficiency [name] has a diagnosis of vitamin D deficiency. She is on prescription Vit D and denies nausea, vomiting or muscle weakness.  Pre-Diabetes [name] has a diagnosis of pre-diabetes based on her elevated Hgb A1c and was informed this puts her at greater risk of developing diabetes. Her last A1c improved at 5.7. She is taking metformin currently and continues to work on diet and exercise to decrease risk of diabetes. She denies hypoglycemia.  At risk for diabetes [name] is at higher than average risk for developing diabetes due to her obesity and pre-diabetes. She currently denies polyuria or polydipsia.  ALLERGIES: No Known Allergies  MEDICATIONS: Current Outpatient Medications on File Prior to Visit  Medication Sig Dispense Refill  . amLODipine (NORVASC) 5 MG tablet Take 1 tablet (5 mg total) by mouth daily. 30 tablet 0  . buPROPion (WELLBUTRIN SR) 150 MG 12 hr tablet Take 1 tablet (150 mg total) by mouth daily. 30 tablet 0  . ibuprofen (ADVIL,MOTRIN) 200 MG tablet Take 1,200 mg by mouth daily as needed for moderate pain (tooth pain).    . meloxicam (MOBIC) 15 MG tablet Take 1 tablet (15 mg total) by mouth daily as needed for pain. 30 tablet 1  . Nebivolol HCl (BYSTOLIC) 20 MG TABS Take 1 tablet (20 mg total) by mouth daily. 30 tablet 11  . triamterene-hydrochlorothiazide (MAXZIDE-25)  37.5-25 MG tablet Take 1 tablet by mouth daily. 30 tablet 11   No current facility-administered medications on file prior to visit.     PAST MEDICAL HISTORY: Past Medical History:  Diagnosis Date  . Anxiety   . Asthma    prn inhaler  . Back pain   . Bell's palsy   . Carpal tunnel syndrome of left wrist 01/2013  . Chest pain   . Cold hands and feet   . Cough   . Depression   . Dry mouth   . Dry skin   . Fatigue   . GERD (gastroesophageal reflux disease)    occasional; no current med.  [name] History of brain tumor 1996   no neurologic deficits  . HTN (hypertension)    under control with med., has been on med. x 5 yr.  . Joint pain   . Obesity   . Obstructive sleep apnea   . PCOS (polycystic ovarian syndrome)   . Polycystic ovarian syndrome    no current med.; irregular periods  . Shortness of breath   . Stress   . Swelling of extremity   . Wheezing     PAST SURGICAL HISTORY: Past Surgical History:  Procedure Laterality Date  . BRAIN SURGERY  1996   exc. tumor  . CARPAL TUNNEL RELEASE Left 02/10/2013   Procedure: CARPAL TUNNEL RELEASE;  Surgeon: Nicki Reaper, MD;  Location: [facility];  Service:  Orthopedics;  Laterality: Left;  ANESTHESIA: IV REGIONAL FAB  . HYSTEROSCOPY W/D&C  02/21/2006  . HYSTEROSCOPY W/D&C  12/17/2011   Procedure: DILATATION AND CURETTAGE /HYSTEROSCOPY;  Surgeon: Meriel Pica, MD;  Location: [facility];  Service: Gynecology;  Laterality: N/A;  . LEEP  01/07/2004  . TONSILLECTOMY  09/21/2002    SOCIAL HISTORY: Social History   Tobacco Use  . Smoking status: Never Smoker  . Smokeless tobacco: Never Used  Substance Use Topics  . Alcohol use: Yes    Comment: rare; socially  . Drug use: No    FAMILY HISTORY: Family History  Problem Relation Age of Onset  . Hypertension Other   . Lung cancer Father        Smoker  . Cancer Father        lung/ smoker  . Heart disease Father   . Alcoholism Father   . Breast cancer Maternal  Grandmother   . Cancer Maternal Grandmother        breast  . Emphysema Paternal Grandmother   . Asthma Mother   . Hyperlipidemia Mother   . Hypertension Mother   . Sleep apnea Mother   . Asthma Maternal Aunt     ROS: Review of Systems  Constitutional: Negative for weight loss.  Gastrointestinal: Negative for nausea and vomiting.  Musculoskeletal:       Negative muscle weakness    PHYSICAL EXAM: Blood pressure 140/85, pulse (!) 59, temperature 98.2 F (36.8 C), temperature source Oral, height 5\' 6"  (1.676 m), weight (!) 354 lb (160.6 kg), last menstrual period 07/22/2018, SpO2 99 %. Body mass index is 57.14 kg/m. Physical Exam  Constitutional: She is oriented to person, place, and time. She appears well-developed and well-nourished.  Cardiovascular: Normal rate.  Pulmonary/Chest: Effort normal.  Musculoskeletal: Normal range of motion.  Neurological: She is oriented to person, place, and time.  Skin: Skin is warm and dry.  Psychiatric: She has a normal mood and affect. Her behavior is normal.  Vitals reviewed.   RECENT LABS AND TESTS: BMET    Component Value Date/Time   NA 142 07/28/2018 1053   K 4.1 07/28/2018 1053   CL 103 07/28/2018 1053   CO2 24 07/28/2018 1053   GLUCOSE 84 07/28/2018 1053   GLUCOSE 103 (H) 12/31/2017 0926   GLUCOSE 83 10/08/2006 1418   BUN 12 07/28/2018 1053   CREATININE 0.97 07/28/2018 1053   CALCIUM 9.2 07/28/2018 1053   GFRNONAA 73 07/28/2018 1053   GFRAA 84 07/28/2018 1053   Lab Results  Component Value Date   HGBA1C 5.7 (H) 07/28/2018   HGBA1C 5.8 (H) 02/26/2018   HGBA1C 5.3 03/31/2015   HGBA1C 6.0 04/20/2011   HGBA1C 5.9 05/24/2010   Lab Results  Component Value Date   INSULIN 14.8 07/28/2018   INSULIN 26.4 (H) 02/26/2018   CBC    Component Value Date/Time   WBC 5.4 02/26/2018 0936   WBC 5.7 12/31/2017 0926   RBC 4.74 02/26/2018 0936   RBC 4.82 12/31/2017 0926   HGB 13.6 02/26/2018 0936   HCT 42.2 02/26/2018 0936    PLT 281.0 12/31/2017 0926   MCV 89 02/26/2018 0936   MCH 28.7 02/26/2018 0936   MCH 29.3 02/04/2017 1851   MCHC 32.2 02/26/2018 0936   MCHC 33.8 12/31/2017 0926   RDW 14.7 02/26/2018 0936   LYMPHSABS 2.1 02/26/2018 0936   MONOABS 0.3 12/31/2017 0926   EOSABS 0.1 02/26/2018 0936   BASOSABS 0.0 02/26/2018 0936   Iron/TIBC/Ferritin/ %Sat  No results found for: IRON, TIBC, FERRITIN, IRONPCTSAT Lipid Panel     Component Value Date/Time   CHOL 191 07/28/2018 1053   TRIG 92 07/28/2018 1053   HDL 47 07/28/2018 1053   CHOLHDL 4 12/31/2017 0926   VLDL 17.2 12/31/2017 0926   LDLCALC 126 (H) 07/28/2018 1053   LDLDIRECT 149.4 05/24/2010 0904   Hepatic Function Panel     Component Value Date/Time   PROT 6.8 07/28/2018 1053   ALBUMIN 4.0 07/28/2018 1053   AST 18 07/28/2018 1053   ALT 16 07/28/2018 1053   ALKPHOS 56 07/28/2018 1053   BILITOT 0.4 07/28/2018 1053   BILIDIR 0.0 12/31/2017 0926      Component Value Date/Time   TSH 2.540 02/26/2018 0936   TSH 1.78 12/31/2017 0926   TSH 1.12 10/01/2013 1056  Results for Wigglesworth, Jeanett L (MRN [id number]) as of 08/18/2018 11:47  Ref. Range 07/28/2018 10:53  Vitamin D, 25-Hydroxy Latest Ref Range: 30.0 - 100.0 ng/mL 27.7 (L)    ASSESSMENT AND PLAN: Vitamin D deficiency - Plan: Vitamin D, Ergocalciferol, (DRISDOL) 50000 units CAPS capsule  Prediabetes - Plan: metFORMIN (GLUCOPHAGE) 500 MG tablet  At risk for diabetes mellitus  Class 3 severe obesity with serious comorbidity and body mass index (BMI) of 50.0 to 59.9 in adult, unspecified obesity type (HCC)  PLAN:  Vitamin D Deficiency Shalanda was informed that low vitamin D levels contributes to fatigue and are associated with obesity, breast, and colon cancer. Halle agrees to continue taking prescription Vit D @50 ,000 IU every week #4 and we will refill for 1 month. She will follow up for routine testing of vitamin D, at least 2-3 times per year. She was informed of the risk of  over-replacement of vitamin D and agrees to not increase her dose unless she discusses this with [name] first. Maryellen agrees to follow up with our clinic in 2 to 3 weeks.  Pre-Diabetes Elisandra will continue to work on weight loss, exercise, and decreasing simple carbohydrates in her diet to help decrease the risk of diabetes. We dicussed metformin including benefits and risks. She was informed that eating too many simple carbohydrates or too many calories at one sitting increases the likelihood of GI side effects. Adrian agrees to continue taking metformin 500 mg q AM #30 and we will refill for 1 month. Zanayah agrees to follow up with our clinic in 2 to 3 weeks as directed to monitor her progress.  Diabetes risk counselling Jolissa was given extended (15 minutes) diabetes prevention counseling today. She is 42 y.o. female and has risk factors for diabetes including obesity and pre-diabetes. We discussed intensive lifestyle modifications today with an emphasis on weight loss as well as increasing exercise and decreasing simple carbohydrates in her diet.  Obesity Sheriden is currently in the action stage of change. As such, her goal is to continue with weight loss efforts She has agreed to follow the Category 2 plan Mozetta has been instructed to work up to a goal of 150 minutes of combined cardio and strengthening exercise per week for weight loss and overall health benefits. We discussed the following Behavioral Modification Strategies today: decrease eating out, work on meal planning and easy cooking plans, and keeping healthy foods in the home   Tomma has agreed to follow up with our clinic in 2 to 3 weeks. She was informed of the importance of frequent follow up visits to maximize her success with intensive lifestyle modifications for her multiple health conditions.  OBESITY BEHAVIORAL INTERVENTION VISIT  Today's visit was # 11  Starting weight: 378 lbs Starting date: 02/26/18 Today's weight :  354 lbs  Today's date: 08/18/2018 Total lbs lost to date: 24    ASK: We discussed the diagnosis of obesity with Ilka L Michalik today and Raushanah agreed to give [name] permission to discuss obesity behavioral modification therapy today.  ASSESS: Jacquiline has the diagnosis of obesity and her BMI today is 57.16 Akiera is in the action stage of change   ADVISE: Juelz was educated on the multiple health risks of obesity as well as the benefit of weight loss to improve her health. She was advised of the need for long term treatment and the importance of lifestyle modifications.  AGREE: Multiple dietary modification options and treatment options were discussed and  Demiah agreed to the above obesity treatment plan.  Trude Mcburney, am acting as transcriptionist for Alois Cliche, PA-C I, Alois Cliche, PA-C have reviewed above note and agree with its content

## 2018-09-01 ENCOUNTER — Ambulatory Visit (INDEPENDENT_AMBULATORY_CARE_PROVIDER_SITE_OTHER): Payer: 59 | Admitting: Physician Assistant

## 2018-09-01 VITALS — BP 164/120 | HR 68 | Temp 97.9°F | Ht 66.0 in | Wt 349.0 lb

## 2018-09-01 DIAGNOSIS — Z6841 Body Mass Index (BMI) 40.0 and over, adult: Secondary | ICD-10-CM | POA: Diagnosis not present

## 2018-09-01 DIAGNOSIS — I10 Essential (primary) hypertension: Secondary | ICD-10-CM | POA: Diagnosis not present

## 2018-09-01 NOTE — Progress Notes (Signed)
Office: [phone]  /  Fax: [phone]   HPI:   Chief Complaint: OBESITY Kim Barnes is here to discuss her progress with her obesity treatment plan. She is on the Category 2 plan and is following her eating plan approximately 0 % of the time. She states she is walking 2.5 hours 5 times per week. Alexander did very well with weight loss. She reports skipping breakfast on some days. She has been eating cereal, due to being tired of eggs and she does not like yogurt.  Her weight is (!) 349 lb (158.3 kg) today and has had a weight loss of 5 pounds over a period of 2 week since her last visit. She has lost 29 lbs since starting treatment with [name].  Hypertension Kim Barnes is a 42 y.o. female with hypertension.  [name] denies chest pain. She is working on weight loss to help control her blood pressure with the goal of decreasing herrisk of heart attack and stroke. [name]'s blood pressure is elevated today due to not taking medications for 3 days. She reports forgetting to take her meds.   ALLERGIES: No Known Allergies  MEDICATIONS: Current Outpatient Medications on File Prior to Visit  Medication Sig Dispense Refill  . amLODipine (NORVASC) 5 MG tablet Take 1 tablet (5 mg total) by mouth daily. 30 tablet 0  . buPROPion (WELLBUTRIN SR) 150 MG 12 hr tablet Take 1 tablet (150 mg total) by mouth daily. 30 tablet 0  . ibuprofen (ADVIL,MOTRIN) 200 MG tablet Take 1,200 mg by mouth daily as needed for moderate pain (tooth pain).    . meloxicam (MOBIC) 15 MG tablet Take 1 tablet (15 mg total) by mouth daily as needed for pain. 30 tablet 1  . metFORMIN (GLUCOPHAGE) 500 MG tablet Take 1 tablet (500 mg total) by mouth daily with breakfast. 30 tablet 0  . Nebivolol HCl (BYSTOLIC) 20 MG TABS Take 1 tablet (20 mg total) by mouth daily. 30 tablet 11  . triamterene-hydrochlorothiazide (MAXZIDE-25) 37.5-25 MG tablet Take 1 tablet by mouth daily. 30 tablet 11  . Vitamin D, Ergocalciferol, (DRISDOL) 50000  units CAPS capsule TAKE ONE CAPSULE BY MOUTH EVERY 7 DAYS 4 capsule 0   No current facility-administered medications on file prior to visit.     PAST MEDICAL HISTORY: Past Medical History:  Diagnosis Date  . Anxiety   . Asthma    prn inhaler  . Back pain   . Bell's palsy   . Carpal tunnel syndrome of left wrist 01/2013  . Chest pain   . Cold hands and feet   . Cough   . Depression   . Dry mouth   . Dry skin   . Fatigue   . GERD (gastroesophageal reflux disease)    occasional; no current med.  [name] History of brain tumor 1996   no neurologic deficits  . HTN (hypertension)    under control with med., has been on med. x 5 yr.  . Joint pain   . Obesity   . Obstructive sleep apnea   . PCOS (polycystic ovarian syndrome)   . Polycystic ovarian syndrome    no current med.; irregular periods  . Shortness of breath   . Stress   . Swelling of extremity   . Wheezing     PAST SURGICAL HISTORY: Past Surgical History:  Procedure Laterality Date  . BRAIN SURGERY  1996   exc. tumor  . CARPAL TUNNEL RELEASE Left 02/10/2013   Procedure: CARPAL TUNNEL RELEASE;  Surgeon: Nicki Reaper, MD;  Location: [facility];  Service: Orthopedics;  Laterality: Left;  ANESTHESIA: IV REGIONAL FAB  . HYSTEROSCOPY W/D&C  02/21/2006  . HYSTEROSCOPY W/D&C  12/17/2011   Procedure: DILATATION AND CURETTAGE /HYSTEROSCOPY;  Surgeon: Meriel Pica, MD;  Location: [facility];  Service: Gynecology;  Laterality: N/A;  . LEEP  01/07/2004  . TONSILLECTOMY  09/21/2002    SOCIAL HISTORY: Social History   Tobacco Use  . Smoking status: Never Smoker  . Smokeless tobacco: Never Used  Substance Use Topics  . Alcohol use: Yes    Comment: rare; socially  . Drug use: No    FAMILY HISTORY: Family History  Problem Relation Age of Onset  . Hypertension Other   . Lung cancer Father        Smoker  . Cancer Father        lung/ smoker  . Heart disease Father   . Alcoholism Father   . Breast cancer  Maternal Grandmother   . Cancer Maternal Grandmother        breast  . Emphysema Paternal Grandmother   . Asthma Mother   . Hyperlipidemia Mother   . Hypertension Mother   . Sleep apnea Mother   . Asthma Maternal Aunt     ROS: Review of Systems  Constitutional: Positive for weight loss.  Cardiovascular: Negative for chest pain.    PHYSICAL EXAM: Blood pressure (!) 164/120, pulse 68, temperature 97.9 F (36.6 C), temperature source Oral, height 5\' 6"  (1.676 m), weight (!) 349 lb (158.3 kg), last menstrual period 08/22/2018, SpO2 96 %. Body mass index is 56.33 kg/m. Physical Exam  Constitutional: She is oriented to person, place, and time. She appears well-developed and well-nourished.  Cardiovascular: Normal rate.  Pulmonary/Chest: Effort normal.  Musculoskeletal: Normal range of motion.  Neurological: She is oriented to person, place, and time.  Skin: Skin is warm and dry.  Psychiatric: She has a normal mood and affect. Her behavior is normal.  Vitals reviewed.   RECENT LABS AND TESTS: BMET    Component Value Date/Time   NA 142 07/28/2018 1053   K 4.1 07/28/2018 1053   CL 103 07/28/2018 1053   CO2 24 07/28/2018 1053   GLUCOSE 84 07/28/2018 1053   GLUCOSE 103 (H) 12/31/2017 0926   GLUCOSE 83 10/08/2006 1418   BUN 12 07/28/2018 1053   CREATININE 0.97 07/28/2018 1053   CALCIUM 9.2 07/28/2018 1053   GFRNONAA 73 07/28/2018 1053   GFRAA 84 07/28/2018 1053   Lab Results  Component Value Date   HGBA1C 5.7 (H) 07/28/2018   HGBA1C 5.8 (H) 02/26/2018   HGBA1C 5.3 03/31/2015   HGBA1C 6.0 04/20/2011   HGBA1C 5.9 05/24/2010   Lab Results  Component Value Date   INSULIN 14.8 07/28/2018   INSULIN 26.4 (H) 02/26/2018   CBC    Component Value Date/Time   WBC 5.4 02/26/2018 0936   WBC 5.7 12/31/2017 0926   RBC 4.74 02/26/2018 0936   RBC 4.82 12/31/2017 0926   HGB 13.6 02/26/2018 0936   HCT 42.2 02/26/2018 0936   PLT 281.0 12/31/2017 0926   MCV 89 02/26/2018 0936     MCH 28.7 02/26/2018 0936   MCH 29.3 02/04/2017 1851   MCHC 32.2 02/26/2018 0936   MCHC 33.8 12/31/2017 0926   RDW 14.7 02/26/2018 0936   LYMPHSABS 2.1 02/26/2018 0936   MONOABS 0.3 12/31/2017 0926   EOSABS 0.1 02/26/2018 0936   BASOSABS 0.0 02/26/2018 0936  Iron/TIBC/Ferritin/ %Sat No results found for: IRON, TIBC, FERRITIN, IRONPCTSAT Lipid Panel     Component Value Date/Time   CHOL 191 07/28/2018 1053   TRIG 92 07/28/2018 1053   HDL 47 07/28/2018 1053   CHOLHDL 4 12/31/2017 0926   VLDL 17.2 12/31/2017 0926   LDLCALC 126 (H) 07/28/2018 1053   LDLDIRECT 149.4 05/24/2010 0904   Hepatic Function Panel     Component Value Date/Time   PROT 6.8 07/28/2018 1053   ALBUMIN 4.0 07/28/2018 1053   AST 18 07/28/2018 1053   ALT 16 07/28/2018 1053   ALKPHOS 56 07/28/2018 1053   BILITOT 0.4 07/28/2018 1053   BILIDIR 0.0 12/31/2017 0926      Component Value Date/Time   TSH 2.540 02/26/2018 0936   TSH 1.78 12/31/2017 0926   TSH 1.12 10/01/2013 1056   Results for Duda, Lavonda L (MRN [id number]) as of 09/01/2018 17:02  Ref. Range 07/28/2018 10:53  Vitamin D, 25-Hydroxy Latest Ref Range: 30.0 - 100.0 ng/mL 27.7 (L)   ASSESSMENT AND PLAN: Essential hypertension  Class 3 severe obesity with serious comorbidity and body mass index (BMI) of 50.0 to 59.9 in adult, unspecified obesity type (HCC)  PLAN:  Hypertension We discussed sodium restriction, working on healthy weight loss, and a regular exercise program as the means to achieve improved blood pressure control. Miryah agreed with this plan and agreed to follow up as directed. We will continue to monitor her blood pressure as well as her progress with the above lifestyle modifications. She will continue with her amlodipine, diet, and wt loss as prescribed and will watch for signs of hypotension as she continues her lifestyle modifications.  Obesity Kent is currently in the action stage of change. As such, her goal is to continue  with weight loss efforts. She has agreed to follow the Category 2 plan. Railyn has been instructed to work up to a goal of 150 minutes of combined cardio and strengthening exercise per week for weight loss and overall health benefits. We discussed the following Behavioral Modification Strategies today: no skipping meals and work on meal planning and easy cooking plans.  I spent > than 50% of the 15 minute visit on counseling as documented in the note.  Deana has agreed to follow up with our clinic in 2 weeks. She was informed of the importance of frequent follow up visits to maximize her success with intensive lifestyle modifications for hermultiple health conditions.   OBESITY BEHAVIORAL INTERVENTION VISIT  Today's visit was # 12  Starting weight: 378 lbs Starting date: 02/26/18 Today's weight : Weight: (!) 349 lb (158.3 kg)  Today's date: 09/01/2018 Total lbs lost to date: 7529  ASK: We discussed the diagnosis of obesity with Simcha L Partain today and Zariana agreed to give us permission to discuss obesity behavioral modification therapy today.  ASSESS: Zuria has the diagnosis of obesity and her BMI today is 56.36. Averi is in the action stage of change.   ADVISE: Jimmey RalphCarisa was educated on the multiple health risks of obesity as well as the benefit of weight loss to improve her health. She was advised of the need for long term treatment and the importance of lifestyle modifications to improve her current health and to decrease her risk of future health problems.  AGREE: Multiple dietary modification options and treatment options were discussed and [place]errath agreed to follow the recommendations documented in the above note.  ARRANGE: Harnoor was educated on the importance of frequent visits to treat obesity as  outlined per CMS and USPSTF guidelines and agreed to schedule her next follow up appointment today.  Launa Flight, am acting as transcriptionist for Alois Cliche, PA-C I, Alois Cliche, PA-C have reviewed above note and agree with its content

## 2018-09-15 ENCOUNTER — Ambulatory Visit (INDEPENDENT_AMBULATORY_CARE_PROVIDER_SITE_OTHER): Payer: 59 | Admitting: Physician Assistant

## 2018-09-15 ENCOUNTER — Encounter (INDEPENDENT_AMBULATORY_CARE_PROVIDER_SITE_OTHER): Payer: Self-pay | Admitting: Physician Assistant

## 2018-09-15 VITALS — BP 135/91 | HR 64 | Temp 98.5°F | Ht 66.0 in | Wt 350.0 lb

## 2018-09-15 DIAGNOSIS — Z9189 Other specified personal risk factors, not elsewhere classified: Secondary | ICD-10-CM

## 2018-09-15 DIAGNOSIS — E559 Vitamin D deficiency, unspecified: Secondary | ICD-10-CM | POA: Diagnosis not present

## 2018-09-15 DIAGNOSIS — I10 Essential (primary) hypertension: Secondary | ICD-10-CM | POA: Diagnosis not present

## 2018-09-15 DIAGNOSIS — Z6841 Body Mass Index (BMI) 40.0 and over, adult: Secondary | ICD-10-CM

## 2018-09-15 MED ORDER — AMLODIPINE BESYLATE 5 MG PO TABS: 5.0000 mg | ORAL_TABLET | Freq: Every day | ORAL | 0 refills | Status: DC

## 2018-09-16 NOTE — Progress Notes (Signed)
Office: [phone]  /  Fax: [phone]   HPI:   Chief Complaint: OBESITY Kim Barnes is here to discuss her progress with her obesity treatment plan. She is on the Category 2 plan and is following her eating plan approximately 50 % of the time. She states she is exercising 0 minutes 0 times per week. Kim Barnes is currently struggling with skipping meals due to stress. She reports that she has already gone grocery shopping and is ready to get back on track.  Her weight is (!) 350 lb (158.8 kg) today and has not lost weight since her last visit. She has lost 28 lbs since starting treatment with [name].  Hypertension Kim Barnes is a 42 y.o. female with hypertension. Kim Barnes denies chest pain. She is working on weight loss to help control her blood pressure with the goal of decreasing her risk of heart attack and stroke. Kim Barnes's blood pressure is currently controlled today.  Vitamin D deficiency Kim Barnes has a diagnosis of vitamin D deficiency. She is currently taking prescription vit D and denies nausea, vomiting or muscle weakness.  At risk for osteopenia and osteoporosis Kim Barnes is at higher risk of osteopenia and osteoporosis due to vitamin D deficiency.   ALLERGIES: No Known Allergies  MEDICATIONS: Current Outpatient Medications on File Prior to Visit  Medication Sig Dispense Refill  . buPROPion (WELLBUTRIN SR) 150 MG 12 hr tablet Take 1 tablet (150 mg total) by mouth daily. 30 tablet 0  . ibuprofen (ADVIL,MOTRIN) 200 MG tablet Take 1,200 mg by mouth daily as needed for moderate pain (tooth pain).    . meloxicam (MOBIC) 15 MG tablet Take 1 tablet (15 mg total) by mouth daily as needed for pain. 30 tablet 1  . metFORMIN (GLUCOPHAGE) 500 MG tablet Take 1 tablet (500 mg total) by mouth daily with breakfast. 30 tablet 0  . Nebivolol HCl (BYSTOLIC) 20 MG TABS Take 1 tablet (20 mg total) by mouth daily. 30 tablet 11  . triamterene-hydrochlorothiazide (MAXZIDE-25) 37.5-25 MG tablet Take 1 tablet  by mouth daily. 30 tablet 11  . Vitamin D, Ergocalciferol, (DRISDOL) 50000 units CAPS capsule TAKE ONE CAPSULE BY MOUTH EVERY 7 DAYS 4 capsule 0   No current facility-administered medications on file prior to visit.     PAST MEDICAL HISTORY: Past Medical History:  Diagnosis Date  . Anxiety   . Asthma    prn inhaler  . Back pain   . Bell's palsy   . Carpal tunnel syndrome of left wrist 01/2013  . Chest pain   . Cold hands and feet   . Cough   . Depression   . Dry mouth   . Dry skin   . Fatigue   . GERD (gastroesophageal reflux disease)    occasional; no current med.  [name] History of brain tumor 1996   no neurologic deficits  . HTN (hypertension)    under control with med., has been on med. x 5 yr.  . Joint pain   . Obesity   . Obstructive sleep apnea   . PCOS (polycystic ovarian syndrome)   . Polycystic ovarian syndrome    no current med.; irregular periods  . Shortness of breath   . Stress   . Swelling of extremity   . Wheezing     PAST SURGICAL HISTORY: Past Surgical History:  Procedure Laterality Date  . BRAIN SURGERY  1996   exc. tumor  . CARPAL TUNNEL RELEASE Left 02/10/2013   Procedure: CARPAL TUNNEL RELEASE;  Surgeon:  Nicki Reaper, MD;  Location: [facility];  Service: Orthopedics;  Laterality: Left;  ANESTHESIA: IV REGIONAL FAB  . HYSTEROSCOPY W/D&C  02/21/2006  . HYSTEROSCOPY W/D&C  12/17/2011   Procedure: DILATATION AND CURETTAGE /HYSTEROSCOPY;  Surgeon: Meriel Pica, MD;  Location: [facility];  Service: Gynecology;  Laterality: N/A;  . LEEP  01/07/2004  . TONSILLECTOMY  09/21/2002    SOCIAL HISTORY: Social History   Tobacco Use  . Smoking status: Never Smoker  . Smokeless tobacco: Never Used  Substance Use Topics  . Alcohol use: Yes    Comment: rare; socially  . Drug use: No    FAMILY HISTORY: Family History  Problem Relation Age of Onset  . Hypertension Other   . Lung cancer Father        Smoker  . Cancer Father        lung/  smoker  . Heart disease Father   . Alcoholism Father   . Breast cancer Maternal Grandmother   . Cancer Maternal Grandmother        breast  . Emphysema Paternal Grandmother   . Asthma Mother   . Hyperlipidemia Mother   . Hypertension Mother   . Sleep apnea Mother   . Asthma Maternal Aunt     ROS: Review of Systems  Constitutional: Negative for weight loss.  Cardiovascular: Negative for chest pain.  Gastrointestinal: Negative for nausea and vomiting.  Musculoskeletal:       Negative for muscle weakness.    PHYSICAL EXAM: Blood pressure (!) 135/91, pulse 64, temperature 98.5 F (36.9 C), temperature source Oral, height 5\' 6"  (1.676 m), weight (!) 350 lb (158.8 kg), last menstrual period 08/22/2018, SpO2 98 %. Body mass index is 56.49 kg/m. Physical Exam  Constitutional: She is oriented to person, place, and time. She appears well-developed and well-nourished.  Cardiovascular: Normal rate.  Pulmonary/Chest: Effort normal.  Musculoskeletal: Normal range of motion.  Neurological: She is oriented to person, place, and time.  Skin: Skin is warm and dry.  Psychiatric: She has a normal mood and affect. Her behavior is normal.  Vitals reviewed.   RECENT LABS AND TESTS: BMET    Component Value Date/Time   NA 142 07/28/2018 1053   K 4.1 07/28/2018 1053   CL 103 07/28/2018 1053   CO2 24 07/28/2018 1053   GLUCOSE 84 07/28/2018 1053   GLUCOSE 103 (H) 12/31/2017 0926   GLUCOSE 83 10/08/2006 1418   BUN 12 07/28/2018 1053   CREATININE 0.97 07/28/2018 1053   CALCIUM 9.2 07/28/2018 1053   GFRNONAA 73 07/28/2018 1053   GFRAA 84 07/28/2018 1053   Lab Results  Component Value Date   HGBA1C 5.7 (H) 07/28/2018   HGBA1C 5.8 (H) 02/26/2018   HGBA1C 5.3 03/31/2015   HGBA1C 6.0 04/20/2011   HGBA1C 5.9 05/24/2010   Lab Results  Component Value Date   INSULIN 14.8 07/28/2018   INSULIN 26.4 (H) 02/26/2018   CBC    Component Value Date/Time   WBC 5.4 02/26/2018 0936   WBC 5.7  12/31/2017 0926   RBC 4.74 02/26/2018 0936   RBC 4.82 12/31/2017 0926   HGB 13.6 02/26/2018 0936   HCT 42.2 02/26/2018 0936   PLT 281.0 12/31/2017 0926   MCV 89 02/26/2018 0936   MCH 28.7 02/26/2018 0936   MCH 29.3 02/04/2017 1851   MCHC 32.2 02/26/2018 0936   MCHC 33.8 12/31/2017 0926   RDW 14.7 02/26/2018 0936   LYMPHSABS 2.1 02/26/2018 0936   MONOABS  0.3 12/31/2017 0926   EOSABS 0.1 02/26/2018 0936   BASOSABS 0.0 02/26/2018 0936   Iron/TIBC/Ferritin/ %Sat No results found for: IRON, TIBC, FERRITIN, IRONPCTSAT Lipid Panel     Component Value Date/Time   CHOL 191 07/28/2018 1053   TRIG 92 07/28/2018 1053   HDL 47 07/28/2018 1053   CHOLHDL 4 12/31/2017 0926   VLDL 17.2 12/31/2017 0926   LDLCALC 126 (H) 07/28/2018 1053   LDLDIRECT 149.4 05/24/2010 0904   Hepatic Function Panel     Component Value Date/Time   PROT 6.8 07/28/2018 1053   ALBUMIN 4.0 07/28/2018 1053   AST 18 07/28/2018 1053   ALT 16 07/28/2018 1053   ALKPHOS 56 07/28/2018 1053   BILITOT 0.4 07/28/2018 1053   BILIDIR 0.0 12/31/2017 0926      Component Value Date/Time   TSH 2.540 02/26/2018 0936   TSH 1.78 12/31/2017 0926   TSH 1.12 10/01/2013 1056   Results for Hawn, Laynee L (MRN [id number]) as of 09/16/2018 08:36  Ref. Range 07/28/2018 10:53  Vitamin D, 25-Hydroxy Latest Ref Range: 30.0 - 100.0 ng/mL 27.7 (L)   ASSESSMENT AND PLAN: Essential hypertension - Plan: amLODipine (NORVASC) 5 MG tablet  Vitamin D deficiency  At risk for osteoporosis  Class 3 severe obesity with serious comorbidity and body mass index (BMI) of 50.0 to 59.9 in adult, unspecified obesity type (HCC)  PLAN:  Hypertension We discussed sodium restriction, working on healthy weight loss, and a regular exercise program as the means to achieve improved blood pressure control.  We will continue to monitor her blood pressure as well as her progress with the above lifestyle modifications. She will continue her amlodipine #30 qd  with no refills and will watch for signs of hypotension as she continues her lifestyle modifications. Cayden agreed with this plan and agreed to follow up as directed.  Vitamin D Deficiency Kim Barnes was informed that low vitamin D levels contributes to fatigue and are associated with obesity, breast, and colon cancer. She agrees to continue to take prescription Vit D @50 ,000 IU every week and will follow up for routine testing of vitamin D, at least 2-3 times per year. She was informed of the risk of over-replacement of vitamin D and agrees to not increase her dose unless she discusses this with [name] first. Kim Barnes agrees to follow up in 2 weeks.  At risk for osteopenia and osteoporosis Kim Barnes was given extended  (15 minutes) osteoporosis prevention counseling today. Kim Barnes is at risk for osteopenia and osteoporosis due to her vitamin D deficiency. She was encouraged to take her vitamin D and follow her higher calcium diet and increase strengthening exercise to help strengthen her bones and decrease her risk of osteopenia and osteoporosis.  Obesity Kim Barnes is currently in the action stage of change. As such, her goal is to continue with weight loss efforts. She has agreed to follow the Category 2 plan. Kim Barnes has been instructed to work up to a goal of 150 minutes of combined cardio and strengthening exercise per week for weight loss and overall health benefits. We discussed the following Behavioral Modification Strategies today: no skipping meals and work on meal planning and easy cooking plans  Kim Barnes has agreed to follow up with our clinic in 2 weeks. She was informed of the importance of frequent follow up visits to maximize her success with intensive lifestyle modifications for her multiple health conditions.   OBESITY BEHAVIORAL INTERVENTION VISIT  Today's visit was # 13  Starting weight: 378 lbs  Starting date: 02/26/18 Today's weight : Weight: (!) 350 lb (158.8 kg)  Today's date:  09/15/2018 Total lbs lost to date: 31  ASK: We discussed the diagnosis of obesity with Kim Barnes today and Kim Barnes agreed to give [name] permission to discuss obesity behavioral modification therapy today.  ASSESS: Kim Barnes has the diagnosis of obesity and her BMI today is 56.52. Kim Barnes is in the action stage of change.   ADVISE: Kim Barnes was educated on the multiple health risks of obesity as well as the benefit of weight loss to improve her health. She was advised of the need for long term treatment and the importance of lifestyle modifications to improve her current health and to decrease her risk of future health problems.  AGREE: Multiple dietary modification options and treatment options were discussed and Kim Barnes agreed to follow the recommendations documented in the above note.  ARRANGE: Leyani was educated on the importance of frequent visits to treat obesity as outlined per CMS and USPSTF guidelines and agreed to schedule her next follow up appointment today.  Kim Barnes, am acting as transcriptionist for Alois Cliche, PA-C I, Alois Cliche, PA-C have reviewed above note and agree with its content

## 2018-09-25 ENCOUNTER — Encounter: Payer: Self-pay | Admitting: Obstetrics & Gynecology

## 2018-09-25 ENCOUNTER — Ambulatory Visit (INDEPENDENT_AMBULATORY_CARE_PROVIDER_SITE_OTHER): Payer: 59 | Admitting: Obstetrics & Gynecology

## 2018-09-25 VITALS — BP 146/90 | Ht 66.0 in | Wt 358.8 lb

## 2018-09-25 DIAGNOSIS — Z01419 Encounter for gynecological examination (general) (routine) without abnormal findings: Secondary | ICD-10-CM

## 2018-09-25 DIAGNOSIS — Z6841 Body Mass Index (BMI) 40.0 and over, adult: Secondary | ICD-10-CM

## 2018-09-25 DIAGNOSIS — Z1151 Encounter for screening for human papillomavirus (HPV): Secondary | ICD-10-CM

## 2018-09-25 DIAGNOSIS — Z3009 Encounter for other general counseling and advice on contraception: Secondary | ICD-10-CM | POA: Diagnosis not present

## 2018-09-25 NOTE — Progress Notes (Signed)
Kim Barnes [birth year] [id number]   History:    42 y.o. G2P0A2 Boyfriend x 6 yrs  RP:  New patient presenting for annual gyn exam   HPI: Menstrual periods every months with normal flow.  No breakthrough bleeding.  No pelvic pain.  Normal vaginal secretions.  No pain with intercourse.  Declines contraception, would welcome a pregnancy.  Urine and bowel movements normal.  Body mass index 57.91.  Followed at the [facility] center, lost 40 pounds so far.  Walking at work, but no additional fitness activity.  Health labs with internist.  Treated for chronic hypertension.  Past medical history,surgical history, family history and social history were all reviewed and documented in the EPIC chart.  Gynecologic History Patient's last menstrual period was 09/25/2018. Contraception: none Last Pap: Many years ago.   Last mammogram: Never.  [facility]. Bone Density: Never Colonoscopy: Never  Obstetric History OB History  Gravida Para Term Preterm AB Living  2       2 0  SAB TAB Ectopic Multiple Live Births  1 1     0    # Outcome Date GA Lbr Len/2nd Weight Sex Delivery Anes PTL Lv  2 SAB           1 TAB              ROS: A ROS was performed and pertinent positives and negatives are included in the history.  GENERAL: No fevers or chills. HEENT: No change in vision, no earache, sore throat or sinus congestion. NECK: No pain or stiffness. CARDIOVASCULAR: No chest pain or pressure. No palpitations. PULMONARY: No shortness of breath, cough or wheeze. GASTROINTESTINAL: No abdominal pain, nausea, vomiting or diarrhea, melena or bright red blood per rectum. GENITOURINARY: No urinary frequency, urgency, hesitancy or dysuria. MUSCULOSKELETAL: No joint or muscle pain, no back pain, no recent trauma. DERMATOLOGIC: No rash, no itching, no lesions. ENDOCRINE: No polyuria, polydipsia, no heat or cold intolerance. No recent change in weight. HEMATOLOGICAL: No anemia or  easy bruising or bleeding. NEUROLOGIC: No headache, seizures, numbness, tingling or weakness. PSYCHIATRIC: No depression, no loss of interest in normal activity or change in sleep pattern.     Exam:   BP (!) 146/90   Ht 5\' 6"  (1.676 m)   Wt (!) 358 lb 12.8 oz (162.8 kg)   LMP 09/25/2018   BMI 57.91 kg/m   Body mass index is 57.91 kg/m.  General appearance : Well developed well nourished female. No acute distress HEENT: Eyes: no retinal hemorrhage or exudates,  Neck supple, trachea midline, no carotid bruits, no thyroidmegaly Lungs: Clear to auscultation, no rhonchi or wheezes, or rib retractions  Heart: Regular rate and rhythm, no murmurs or gallops Breast:Examined in sitting and supine position were symmetrical in appearance, no palpable masses or tenderness,  no skin retraction, no nipple inversion, no nipple discharge, no skin discoloration, no axillary or supraclavicular lymphadenopathy Abdomen: no palpable masses or tenderness, no rebound or guarding Extremities: no edema or skin discoloration or tenderness  Pelvic: Vulva: Normal             Vagina: No gross lesions or discharge  Cervix: No gross lesions or discharge.  Pap/HPV HR done.  Uterus  AV, normal size, shape and consistency, non-tender and mobile  Adnexa  Without masses or tenderness  Anus: Normal   Assessment/Plan:  42 y.o. female for annual exam   1. Encounter for routine gynecological examination with  Papanicolaou smear of cervix Normal gynecologic exam.  Pap test with high risk HPV done today.  Breast exam normal.  Will schedule a screening mammogram now at the breast center.  Health labs with family physician.  2. Encounter for other general counseling or advice on contraception Declines contraception.  Would welcome a pregnancy.  Recommend prenatal vitamin and working on fitness.  3. Class 3 severe obesity due to excess calories with serious comorbidity and body mass index (BMI) of 50.0 to 59.9 in adult  [facility]) Continue with [facility].  Recommend increasing physical activity with aerobic activity 5 times a week and weightlifting every 2 days.  [name] MD, 2:40 PM 09/25/2018

## 2018-09-26 ENCOUNTER — Encounter: Payer: Self-pay | Admitting: Obstetrics & Gynecology

## 2018-09-26 LAB — PAP, TP IMAGING W/ HPV RNA, RFLX HPV TYPE 16,18/45: HPV DNA HIGH RISK: NOT DETECTED

## 2018-09-26 NOTE — Patient Instructions (Signed)
1. Encounter for routine gynecological examination with Papanicolaou smear of cervix Normal gynecologic exam.  Pap test with high risk HPV done today.  Breast exam normal.  Will schedule a screening mammogram now at the breast center.  Health labs with family physician.  2. Encounter for other general counseling or advice on contraception Declines contraception.  Would welcome a pregnancy.  Recommend prenatal vitamin and working on fitness.  3. Class 3 severe obesity due to excess calories with serious comorbidity and body mass index (BMI) of 50.0 to 59.9 in adult [facility]) Continue with [facility].  Recommend increasing physical activity with aerobic activity 5 times a week and weightlifting every 2 days.  Kim Barnes, it was a pleasure meeting you today!  I will inform you of your results as soon as they are available.

## 2018-09-29 ENCOUNTER — Ambulatory Visit (INDEPENDENT_AMBULATORY_CARE_PROVIDER_SITE_OTHER): Payer: 59 | Admitting: Physician Assistant

## 2018-09-29 VITALS — BP 124/80 | HR 58 | Temp 97.8°F | Ht 66.0 in | Wt 352.0 lb

## 2018-09-29 DIAGNOSIS — Z9189 Other specified personal risk factors, not elsewhere classified: Secondary | ICD-10-CM

## 2018-09-29 DIAGNOSIS — I10 Essential (primary) hypertension: Secondary | ICD-10-CM | POA: Diagnosis not present

## 2018-09-29 DIAGNOSIS — E559 Vitamin D deficiency, unspecified: Secondary | ICD-10-CM | POA: Diagnosis not present

## 2018-09-29 DIAGNOSIS — Z6841 Body Mass Index (BMI) 40.0 and over, adult: Secondary | ICD-10-CM

## 2018-09-29 MED ORDER — VITAMIN D (ERGOCALCIFEROL) 1.25 MG (50000 UNIT) PO CAPS: ORAL_CAPSULE | ORAL | 0 refills | Status: DC

## 2018-10-01 NOTE — Progress Notes (Signed)
Office: [phone]  /  Fax: [phone]   HPI:   Chief Complaint: OBESITY Kim Barnes is here to discuss her progress with her obesity treatment plan. She is on the Category 2 plan and is following her eating plan approximately 30 % of the time. She states she is exercising 0 minutes 0 times per week. Kim Barnes did very well with weight loss. She reports eating out more due to going back and forth taking care of her mom.  Her weight is (!) 352 lb (159.7 kg) today and has had a weight loss of 6 pounds over a period of 2 weeks since her last visit. She has lost 26 lbs since starting treatment with [name].  Vitamin D deficiency Kim Barnes has a diagnosis of vitamin D deficiency. She is currently taking vit D and denies nausea, vomiting or muscle weakness.  At risk for osteopenia and osteoporosis Kim Barnes is at higher risk of osteopenia and osteoporosis due to vitamin D deficiency.   Hypertension Kim Barnes is a 42 y.o. female with hypertension. She is working on weight loss to help control her blood pressure with the goal of decreasing her risk of heart attack and stroke. She is taking amlodipine 5mg  and triamterene-HCTZ 37.5-25mg  and her blood pressure is normal. Kim Barnes denies chest pain.  ALLERGIES: No Known Allergies  MEDICATIONS: Current Outpatient Medications on File Prior to Visit  Medication Sig Dispense Refill  . ALBUTEROL IN Inhale into the lungs.    Kim Barnes amLODipine (NORVASC) 5 MG tablet Take 1 tablet (5 mg total) by mouth daily. 30 tablet 0  . buPROPion (WELLBUTRIN SR) 150 MG 12 hr tablet Take 1 tablet (150 mg total) by mouth daily. 30 tablet 0  . ibuprofen (ADVIL,MOTRIN) 200 MG tablet Take 1,200 mg by mouth daily as needed for moderate pain (tooth pain).    . meloxicam (MOBIC) 15 MG tablet Take 1 tablet (15 mg total) by mouth daily as needed for pain. 30 tablet 1  . metFORMIN (GLUCOPHAGE) 500 MG tablet Take 1 tablet (500 mg total) by mouth daily with breakfast. 30 tablet 0  . Nebivolol  HCl (BYSTOLIC) 20 MG TABS Take 1 tablet (20 mg total) by mouth daily. 30 tablet 11  . triamterene-hydrochlorothiazide (MAXZIDE-25) 37.5-25 MG tablet Take 1 tablet by mouth daily. 30 tablet 11   No current facility-administered medications on file prior to visit.     PAST MEDICAL HISTORY: Past Medical History:  Diagnosis Date  . Anxiety   . Asthma    prn inhaler  . Back pain   . Bell's palsy   . Carpal tunnel syndrome of left wrist 01/2013  . Chest pain   . Cold hands and feet   . Cough   . Depression   . Dry mouth   . Dry skin   . Fatigue   . GERD (gastroesophageal reflux disease)    occasional; no current med.  Kim Barnes History of brain tumor 1996   no neurologic deficits  . HTN (hypertension)    under control with med., has been on med. x 5 yr.  . Joint pain   . Obesity   . Obstructive sleep apnea   . PCOS (polycystic ovarian syndrome)   . Polycystic ovarian syndrome    no current med.; irregular periods  . Shortness of breath   . Stress   . Swelling of extremity   . Wheezing     PAST SURGICAL HISTORY: Past Surgical History:  Procedure Laterality Date  . BRAIN SURGERY  1996   exc. tumor  . CARPAL TUNNEL RELEASE Left 02/10/2013   Procedure: CARPAL TUNNEL RELEASE;  Surgeon: Nicki Reaper, MD;  Location: [facility];  Service: Orthopedics;  Laterality: Left;  ANESTHESIA: IV REGIONAL FAB  . HYSTEROSCOPY W/D&C  02/21/2006  . HYSTEROSCOPY W/D&C  12/17/2011   Procedure: DILATATION AND CURETTAGE /HYSTEROSCOPY;  Surgeon: Meriel Pica, MD;  Location: [facility];  Service: Gynecology;  Laterality: N/A;  . LEEP  01/07/2004  . TONSILLECTOMY  09/21/2002    SOCIAL HISTORY: Social History   Tobacco Use  . Smoking status: Never Smoker  . Smokeless tobacco: Never Used  Substance Use Topics  . Alcohol use: Yes    Comment: rare; socially  . Drug use: No    FAMILY HISTORY: Family History  Problem Relation Age of Onset  . Hypertension Other   . Lung cancer Father         Smoker  . Cancer Father        lung/ smoker  . Heart disease Father   . Alcoholism Father   . Breast cancer Maternal Grandmother   . Cancer Maternal Grandmother        breast  . Emphysema Paternal Grandmother   . Asthma Mother   . Hyperlipidemia Mother   . Hypertension Mother   . Sleep apnea Mother   . Asthma Maternal Aunt   . Breast cancer Maternal Aunt        brain   . Breast cancer Paternal Aunt        prostate    ROS: Review of Systems  Constitutional: Positive for weight loss.    PHYSICAL EXAM: Blood pressure 124/80, pulse (!) 58, temperature 97.8 F (36.6 C), temperature source Oral, height 5\' 6"  (1.676 m), weight (!) 352 lb (159.7 kg), last menstrual period 09/25/2018, SpO2 98 %. Body mass index is 56.81 kg/m. Physical Exam  RECENT LABS AND TESTS: BMET    Component Value Date/Time   NA 142 07/28/2018 1053   K 4.1 07/28/2018 1053   CL 103 07/28/2018 1053   CO2 24 07/28/2018 1053   GLUCOSE 84 07/28/2018 1053   GLUCOSE 103 (H) 12/31/2017 0926   GLUCOSE 83 10/08/2006 1418   BUN 12 07/28/2018 1053   CREATININE 0.97 07/28/2018 1053   CALCIUM 9.2 07/28/2018 1053   GFRNONAA 73 07/28/2018 1053   GFRAA 84 07/28/2018 1053   Lab Results  Component Value Date   HGBA1C 5.7 (H) 07/28/2018   HGBA1C 5.8 (H) 02/26/2018   HGBA1C 5.3 03/31/2015   HGBA1C 6.0 04/20/2011   HGBA1C 5.9 05/24/2010   Lab Results  Component Value Date   INSULIN 14.8 07/28/2018   INSULIN 26.4 (H) 02/26/2018   CBC    Component Value Date/Time   WBC 5.4 02/26/2018 0936   WBC 5.7 12/31/2017 0926   RBC 4.74 02/26/2018 0936   RBC 4.82 12/31/2017 0926   HGB 13.6 02/26/2018 0936   HCT 42.2 02/26/2018 0936   PLT 281.0 12/31/2017 0926   MCV 89 02/26/2018 0936   MCH 28.7 02/26/2018 0936   MCH 29.3 02/04/2017 1851   MCHC 32.2 02/26/2018 0936   MCHC 33.8 12/31/2017 0926   RDW 14.7 02/26/2018 0936   LYMPHSABS 2.1 02/26/2018 0936   MONOABS 0.3 12/31/2017 0926   EOSABS 0.1 02/26/2018  0936   BASOSABS 0.0 02/26/2018 0936   Iron/TIBC/Ferritin/ %Sat No results found for: IRON, TIBC, FERRITIN, IRONPCTSAT Lipid Panel     Component Value Date/Time   CHOL  191 07/28/2018 1053   TRIG 92 07/28/2018 1053   HDL 47 07/28/2018 1053   CHOLHDL 4 12/31/2017 0926   VLDL 17.2 12/31/2017 0926   LDLCALC 126 (H) 07/28/2018 1053   LDLDIRECT 149.4 05/24/2010 0904   Hepatic Function Panel     Component Value Date/Time   PROT 6.8 07/28/2018 1053   ALBUMIN 4.0 07/28/2018 1053   AST 18 07/28/2018 1053   ALT 16 07/28/2018 1053   ALKPHOS 56 07/28/2018 1053   BILITOT 0.4 07/28/2018 1053   BILIDIR 0.0 12/31/2017 0926      Component Value Date/Time   TSH 2.540 02/26/2018 0936   TSH 1.78 12/31/2017 0926   TSH 1.12 10/01/2013 1056   Results for Sidener, Orra L (MRN [id number]) as of 10/01/2018 16:13  Ref. Range 07/28/2018 10:53  Vitamin D, 25-Hydroxy Latest Ref Range: 30.0 - 100.0 ng/mL 27.7 (L)   ASSESSMENT AND PLAN: Vitamin D deficiency - Plan: Vitamin D, Ergocalciferol, (DRISDOL) 50000 units CAPS capsule  Essential hypertension  At risk for osteoporosis  Class 3 severe obesity with serious comorbidity and body mass index (BMI) of 50.0 to 59.9 in adult, unspecified obesity type (HCC)  PLAN:  Vitamin D Deficiency Yoshi was informed that low vitamin D levels contributes to fatigue and are associated with obesity, breast, and colon cancer. She agrees to continue to take prescription Vit D @50 ,000 IU every week # 4 with no refills and will follow up for routine testing of vitamin D, at least 2-3 times per year. She was informed of the risk of over-replacement of vitamin D and agrees to not increase her dose unless she discusses this with [name] first. Cloria Spring agrees to follow up in 2 weeks.  At risk for osteopenia and osteoporosis Masaye was given extended (15 minutes) osteoporosis prevention counseling today. Vanya is at risk for osteopenia and osteoporosis due to her vitamin D  deficiency. She was encouraged to take her vitamin D and follow her higher calcium diet and increase strengthening exercise to help strengthen her bones and decrease her risk of osteopenia and osteoporosis.  Hypertension We discussed sodium restriction, working on healthy weight loss, and a regular exercise program as the means to achieve improved blood pressure control. We will continue to monitor her blood pressure as well as her progress with the above lifestyle modifications. She agrees to continue her diet, weight loss, and medications as prescribed and will watch for signs of hypotension as she continues her lifestyle modifications. Dara agreed with this plan and agreed to follow up as directed.  Obesity Braelee is currently in the action stage of change. As such, her goal is to continue with weight loss efforts. She has agreed to follow the Category 2 plan. Antonetta has been instructed to work up to a goal of 150 minutes of combined cardio and strengthening exercise per week for weight loss and overall health benefits. We discussed the following Behavioral Modification Strategies today: work on meal planning and easy cooking plans and travel eating strategies.  Kyani has agreed to follow up with our clinic in 2 weeks. She was informed of the importance of frequent follow up visits to maximize her success with intensive lifestyle modifications for her multiple health conditions.   OBESITY BEHAVIORAL INTERVENTION VISIT  Today's visit was # 14   Starting weight: 378 lbs Starting date: 02/26/18 Today's weight : Weight: (!) 352 lb (159.7 kg)  Today's date: 09/10/2018 Total lbs lost to date: 51  ASK: We discussed the diagnosis of obesity  with Verl L Schlauch today and Jaima agreed to give [name] permission to discuss obesity behavioral modification therapy today.  ASSESS: Kalya has the diagnosis of obesity and her BMI today is 56.84. Tymber is in the action stage of  change.  ADVISE: Mayanna was educated on the multiple health risks of obesity as well as the benefit of weight loss to improve her health. She was advised of the need for long term treatment and the importance of lifestyle modifications to improve her current health and to decrease her risk of future health problems.  AGREE: Multiple dietary modification options and treatment options were discussed and [place] agreed to follow the recommendations documented in the above note.  ARRANGE: Shakora was educated on the importance of frequent visits to treat obesity as outlined per CMS and USPSTF guidelines and agreed to schedule her next follow up appointment today.  Launa Flight, am acting as transcriptionist for Alois Cliche, PA-C I, Alois Cliche, PA-C have reviewed above note and agree with its content

## 2018-10-13 ENCOUNTER — Ambulatory Visit (INDEPENDENT_AMBULATORY_CARE_PROVIDER_SITE_OTHER): Payer: 59 | Admitting: Physician Assistant

## 2018-10-13 ENCOUNTER — Encounter (INDEPENDENT_AMBULATORY_CARE_PROVIDER_SITE_OTHER): Payer: Self-pay | Admitting: Physician Assistant

## 2018-10-13 VITALS — BP 137/86 | HR 61 | Temp 98.6°F | Ht 66.0 in | Wt 353.0 lb

## 2018-10-13 DIAGNOSIS — Z6841 Body Mass Index (BMI) 40.0 and over, adult: Secondary | ICD-10-CM

## 2018-10-13 DIAGNOSIS — E559 Vitamin D deficiency, unspecified: Secondary | ICD-10-CM

## 2018-10-14 NOTE — Progress Notes (Signed)
Office: [phone]  /  Fax: [phone]   HPI:   Chief Complaint: OBESITY Cyndia is here to discuss her progress with her obesity treatment plan. She is on the Category 2 plan and is following her eating plan approximately 30 % of the time. She states she is exercising 0 minutes 0 times per week. Mikya reports that she has been off track with her eating because she has been at her mother's house for 2 weeks. She is back home and ready to get back on track.  Her weight is (!) 353 lb (160.1 kg) today and has had a weight gain of 1 pound over a period of 2 weeks since her last visit. She has lost 25 lbs since starting treatment with [name].  Vitamin D deficiency Aribelle has a diagnosis of vitamin D deficiency. She is currently taking vit D and denies nausea, vomiting or muscle weakness.  ALLERGIES: No Known Allergies  MEDICATIONS: Current Outpatient Medications on File Prior to Visit  Medication Sig Dispense Refill  . ALBUTEROL IN Inhale into the lungs.    [name] amLODipine (NORVASC) 5 MG tablet Take 1 tablet (5 mg total) by mouth daily. 30 tablet 0  . buPROPion (WELLBUTRIN SR) 150 MG 12 hr tablet Take 1 tablet (150 mg total) by mouth daily. 30 tablet 0  . ibuprofen (ADVIL,MOTRIN) 200 MG tablet Take 1,200 mg by mouth daily as needed for moderate pain (tooth pain).    . meloxicam (MOBIC) 15 MG tablet Take 1 tablet (15 mg total) by mouth daily as needed for pain. 30 tablet 1  . metFORMIN (GLUCOPHAGE) 500 MG tablet Take 1 tablet (500 mg total) by mouth daily with breakfast. 30 tablet 0  . Nebivolol HCl (BYSTOLIC) 20 MG TABS Take 1 tablet (20 mg total) by mouth daily. 30 tablet 11  . triamterene-hydrochlorothiazide (MAXZIDE-25) 37.5-25 MG tablet Take 1 tablet by mouth daily. 30 tablet 11  . Vitamin D, Ergocalciferol, (DRISDOL) 50000 units CAPS capsule TAKE ONE CAPSULE BY MOUTH EVERY 7 DAYS 4 capsule 0   No current facility-administered medications on file prior to visit.     PAST MEDICAL  HISTORY: Past Medical History:  Diagnosis Date  . Anxiety   . Asthma    prn inhaler  . Back pain   . Bell's palsy   . Carpal tunnel syndrome of left wrist 01/2013  . Chest pain   . Cold hands and feet   . Cough   . Depression   . Dry mouth   . Dry skin   . Fatigue   . GERD (gastroesophageal reflux disease)    occasional; no current med.  [name] History of brain tumor 1996   no neurologic deficits  . HTN (hypertension)    under control with med., has been on med. x 5 yr.  . Joint pain   . Obesity   . Obstructive sleep apnea   . PCOS (polycystic ovarian syndrome)   . Polycystic ovarian syndrome    no current med.; irregular periods  . Shortness of breath   . Stress   . Swelling of extremity   . Wheezing     PAST SURGICAL HISTORY: Past Surgical History:  Procedure Laterality Date  . BRAIN SURGERY  1996   exc. tumor  . CARPAL TUNNEL RELEASE Left 02/10/2013   Procedure: CARPAL TUNNEL RELEASE;  Surgeon: Nicki Reaper, MD;  Location: [facility];  Service: Orthopedics;  Laterality: Left;  ANESTHESIA: IV REGIONAL FAB  . HYSTEROSCOPY W/D&C  02/21/2006  . HYSTEROSCOPY W/D&C  12/17/2011   Procedure: DILATATION AND CURETTAGE /HYSTEROSCOPY;  Surgeon: Meriel Pica, MD;  Location: [facility];  Service: Gynecology;  Laterality: N/A;  . LEEP  01/07/2004  . TONSILLECTOMY  09/21/2002    SOCIAL HISTORY: Social History   Tobacco Use  . Smoking status: Never Smoker  . Smokeless tobacco: Never Used  Substance Use Topics  . Alcohol use: Yes    Comment: rare; socially  . Drug use: No    FAMILY HISTORY: Family History  Problem Relation Age of Onset  . Hypertension Other   . Lung cancer Father        Smoker  . Cancer Father        lung/ smoker  . Heart disease Father   . Alcoholism Father   . Breast cancer Maternal Grandmother   . Cancer Maternal Grandmother        breast  . Emphysema Paternal Grandmother   . Asthma Mother   . Hyperlipidemia Mother   . Hypertension  Mother   . Sleep apnea Mother   . Asthma Maternal Aunt   . Breast cancer Maternal Aunt        brain   . Breast cancer Paternal Aunt        prostate    ROS: Review of Systems  Constitutional: Negative for weight loss.  Gastrointestinal: Negative for nausea and vomiting.  Musculoskeletal:       Negative for muscle weakness.    PHYSICAL EXAM: Blood pressure 137/86, pulse 61, temperature 98.6 F (37 C), temperature source Oral, height 5\' 6"  (1.676 m), weight (!) 353 lb (160.1 kg), last menstrual period 09/25/2018, SpO2 94 %. Body mass index is 56.98 kg/m. Physical Exam  Constitutional: She is oriented to person, place, and time. She appears well-developed and well-nourished.  Cardiovascular: Normal rate.  Pulmonary/Chest: Effort normal.  Musculoskeletal: Normal range of motion.  Neurological: She is oriented to person, place, and time.  Skin: Skin is warm and dry.  Psychiatric: She has a normal mood and affect. Her behavior is normal.  Vitals reviewed.   RECENT LABS AND TESTS: BMET    Component Value Date/Time   NA 142 07/28/2018 1053   K 4.1 07/28/2018 1053   CL 103 07/28/2018 1053   CO2 24 07/28/2018 1053   GLUCOSE 84 07/28/2018 1053   GLUCOSE 103 (H) 12/31/2017 0926   GLUCOSE 83 10/08/2006 1418   BUN 12 07/28/2018 1053   CREATININE 0.97 07/28/2018 1053   CALCIUM 9.2 07/28/2018 1053   GFRNONAA 73 07/28/2018 1053   GFRAA 84 07/28/2018 1053   Lab Results  Component Value Date   HGBA1C 5.7 (H) 07/28/2018   HGBA1C 5.8 (H) 02/26/2018   HGBA1C 5.3 03/31/2015   HGBA1C 6.0 04/20/2011   HGBA1C 5.9 05/24/2010   Lab Results  Component Value Date   INSULIN 14.8 07/28/2018   INSULIN 26.4 (H) 02/26/2018   CBC    Component Value Date/Time   WBC 5.4 02/26/2018 0936   WBC 5.7 12/31/2017 0926   RBC 4.74 02/26/2018 0936   RBC 4.82 12/31/2017 0926   HGB 13.6 02/26/2018 0936   HCT 42.2 02/26/2018 0936   PLT 281.0 12/31/2017 0926   MCV 89 02/26/2018 0936   MCH 28.7  02/26/2018 0936   MCH 29.3 02/04/2017 1851   MCHC 32.2 02/26/2018 0936   MCHC 33.8 12/31/2017 0926   RDW 14.7 02/26/2018 0936   LYMPHSABS 2.1 02/26/2018 0936   MONOABS 0.3 12/31/2017 0926  EOSABS 0.1 02/26/2018 0936   BASOSABS 0.0 02/26/2018 0936   Iron/TIBC/Ferritin/ %Sat No results found for: IRON, TIBC, FERRITIN, IRONPCTSAT Lipid Panel     Component Value Date/Time   CHOL 191 07/28/2018 1053   TRIG 92 07/28/2018 1053   HDL 47 07/28/2018 1053   CHOLHDL 4 12/31/2017 0926   VLDL 17.2 12/31/2017 0926   LDLCALC 126 (H) 07/28/2018 1053   LDLDIRECT 149.4 05/24/2010 0904   Hepatic Function Panel     Component Value Date/Time   PROT 6.8 07/28/2018 1053   ALBUMIN 4.0 07/28/2018 1053   AST 18 07/28/2018 1053   ALT 16 07/28/2018 1053   ALKPHOS 56 07/28/2018 1053   BILITOT 0.4 07/28/2018 1053   BILIDIR 0.0 12/31/2017 0926      Component Value Date/Time   TSH 2.540 02/26/2018 0936   TSH 1.78 12/31/2017 0926   TSH 1.12 10/01/2013 1056   Results for [place], Carmelite L (MRN [id number]) as of 10/14/2018 11:42  Ref. Range 07/28/2018 10:53  Vitamin D, 25-Hydroxy Latest Ref Range: 30.0 - 100.0 ng/mL 27.7 (L)   ASSESSMENT AND PLAN: Vitamin D deficiency  Class 3 severe obesity with serious comorbidity and body mass index (BMI) of 50.0 to 59.9 in adult, unspecified obesity type (HCC)  PLAN:  Vitamin D Deficiency Sabrinna was informed that low vitamin D levels contributes to fatigue and are associated with obesity, breast, and colon cancer. She agrees to continue to take prescription Vit D @50 ,000 IU every week and will follow up for routine testing of vitamin D, at least 2-3 times per year. She was informed of the risk of over-replacement of vitamin D and agrees to not increase her dose unless she discusses this with [name] first. Cloria Spring agrees to follow up in 2 weeks.  I spent > than 50% of the 15 minute visit on counseling as documented in the note.  Obesity Saniyyah is currently in the  action stage of change. As such, her goal is to continue with weight loss efforts. She has agreed to follow the Category 2 plan. Camiah has been instructed to work up to a goal of 150 minutes of combined cardio and strengthening exercise per week for weight loss and overall health benefits. We discussed the following Behavioral Modification Strategies today: work on meal planning and easy cooking plans and better snacking choices.  Treacy has agreed to follow up with our clinic in 2 weeks. She was informed of the importance of frequent follow up visits to maximize her success with intensive lifestyle modifications for her multiple health conditions.   OBESITY BEHAVIORAL INTERVENTION VISIT  Today's visit was # 15   Starting weight: 378 lbs Starting date: 02/26/18 Today's weight : Weight: (!) 353 lb (160.1 kg)  Today's date: 10/13/2018 Total lbs lost to date: 25  ASK: We discussed the diagnosis of obesity with Jaidin L Rosiles today and Loria agreed to give [name] permission to discuss obesity behavioral modification therapy today.  ASSESS: Mauri has the diagnosis of obesity and her BMI today is 57.0. Markeisha is in the action stage of change.   ADVISE: Nayah was educated on the multiple health risks of obesity as well as the benefit of weight loss to improve her health. She was advised of the need for long term treatment and the importance of lifestyle modifications to improve her current health and to decrease her risk of future health problems.  AGREE: Multiple dietary modification options and treatment options were discussed and Imanii agreed to follow the  recommendations documented in the above note.  ARRANGE: Khalea was educated on the importance of frequent visits to treat obesity as outlined per CMS and USPSTF guidelines and agreed to schedule her next follow up appointment today.  Launa Flight, am acting as transcriptionist for Alois Cliche, PA-C I, Alois Cliche, PA-C have  reviewed above note and agree with its content

## 2018-10-27 ENCOUNTER — Other Ambulatory Visit (INDEPENDENT_AMBULATORY_CARE_PROVIDER_SITE_OTHER): Payer: Self-pay | Admitting: Family Medicine

## 2018-10-27 ENCOUNTER — Encounter (INDEPENDENT_AMBULATORY_CARE_PROVIDER_SITE_OTHER): Payer: Self-pay

## 2018-10-27 ENCOUNTER — Ambulatory Visit (INDEPENDENT_AMBULATORY_CARE_PROVIDER_SITE_OTHER): Payer: 59 | Admitting: Physician Assistant

## 2018-10-27 DIAGNOSIS — I10 Essential (primary) hypertension: Secondary | ICD-10-CM

## 2018-10-28 ENCOUNTER — Ambulatory Visit (INDEPENDENT_AMBULATORY_CARE_PROVIDER_SITE_OTHER): Payer: 59 | Admitting: Bariatrics

## 2018-10-28 VITALS — BP 154/100 | HR 61 | Temp 97.8°F | Ht 66.0 in | Wt 353.0 lb

## 2018-10-28 DIAGNOSIS — E282 Polycystic ovarian syndrome: Secondary | ICD-10-CM

## 2018-10-28 DIAGNOSIS — Z6841 Body Mass Index (BMI) 40.0 and over, adult: Secondary | ICD-10-CM

## 2018-10-28 DIAGNOSIS — E559 Vitamin D deficiency, unspecified: Secondary | ICD-10-CM

## 2018-10-28 DIAGNOSIS — I10 Essential (primary) hypertension: Secondary | ICD-10-CM | POA: Diagnosis not present

## 2018-11-03 ENCOUNTER — Encounter (INDEPENDENT_AMBULATORY_CARE_PROVIDER_SITE_OTHER): Payer: Self-pay | Admitting: Bariatrics

## 2018-11-03 DIAGNOSIS — Z6841 Body Mass Index (BMI) 40.0 and over, adult: Secondary | ICD-10-CM

## 2018-11-03 NOTE — Progress Notes (Signed)
Office: [phone]  /  Fax: [phone]   HPI:   Chief Complaint: OBESITY Kim Barnes is here to discuss her progress with her obesity treatment plan. She is on the Category 2 plan and is following her eating plan approximately 20 % of the time. She states she is exercising 0 minutes 0 times per week. Kim Barnes has had a "lot going on" and finding it difficult to stay on track.  Her weight is (!) 353 lb (160.1 kg) today and has not lost weight since her last visit. She has lost 25 lbs since starting treatment with [name].  Vitamin D deficiency Kim Barnes has a diagnosis of vitamin D deficiency. She is currently taking high dose vit D and denies nausea, vomiting, or muscle weakness.  Polycystic Ovarian Syndrome Kim Barnes has a diagnosis of polycystic ovarian syndrome and is taking metformin and she denies side effects. Her Hgb A1c was 5.7 and her Insulin was 14.8 on 07/28/18.  Hypertension Kim Barnes is a 42 y.o. female with hypertension. She is working on weight loss to help control her blood pressure with the goal of decreasing her risk of heart attack and stroke. She is taking Maxzide 37.5-25mg , amlodipine 5 mg, and Nebivolol 20mg . She took her medications today, but did not take her medicines yesterday. Kim Barnes's blood pressure was 154/100 today and is not well controlled. Kim Barnes denies chest pain or headaches.  ALLERGIES: No Known Allergies  MEDICATIONS: Current Outpatient Medications on File Prior to Visit  Medication Sig Dispense Refill  . ALBUTEROL IN Inhale into the lungs.    [name] amLODipine (NORVASC) 5 MG tablet Take 1 tablet (5 mg total) by mouth daily. 30 tablet 0  . buPROPion (WELLBUTRIN SR) 150 MG 12 hr tablet Take 1 tablet (150 mg total) by mouth daily. 30 tablet 0  . ibuprofen (ADVIL,MOTRIN) 200 MG tablet Take 1,200 mg by mouth daily as needed for moderate pain (tooth pain).    . meloxicam (MOBIC) 15 MG tablet Take 1 tablet (15 mg total) by mouth daily as needed for pain. 30 tablet 1    . metFORMIN (GLUCOPHAGE) 500 MG tablet Take 1 tablet (500 mg total) by mouth daily with breakfast. 30 tablet 0  . Nebivolol HCl (BYSTOLIC) 20 MG TABS Take 1 tablet (20 mg total) by mouth daily. 30 tablet 11  . triamterene-hydrochlorothiazide (MAXZIDE-25) 37.5-25 MG tablet Take 1 tablet by mouth daily. 30 tablet 11  . Vitamin D, Ergocalciferol, (DRISDOL) 50000 units CAPS capsule TAKE ONE CAPSULE BY MOUTH EVERY 7 DAYS 4 capsule 0   No current facility-administered medications on file prior to visit.     PAST MEDICAL HISTORY: Past Medical History:  Diagnosis Date  . Anxiety   . Asthma    prn inhaler  . Back pain   . Bell's palsy   . Carpal tunnel syndrome of left wrist 01/2013  . Chest pain   . Cold hands and feet   . Cough   . Depression   . Dry mouth   . Dry skin   . Fatigue   . GERD (gastroesophageal reflux disease)    occasional; no current med.  [name] History of brain tumor 1996   no neurologic deficits  . HTN (hypertension)    under control with med., has been on med. x 5 yr.  . Joint pain   . Obesity   . Obstructive sleep apnea   . PCOS (polycystic ovarian syndrome)   . Polycystic ovarian syndrome    no current med.; irregular  periods  . Shortness of breath   . Stress   . Swelling of extremity   . Wheezing     PAST SURGICAL HISTORY: Past Surgical History:  Procedure Laterality Date  . BRAIN SURGERY  1996   exc. tumor  . CARPAL TUNNEL RELEASE Left 02/10/2013   Procedure: CARPAL TUNNEL RELEASE;  Surgeon: Nicki Reaper, MD;  Location: [facility];  Service: Orthopedics;  Laterality: Left;  ANESTHESIA: IV REGIONAL FAB  . HYSTEROSCOPY W/D&C  02/21/2006  . HYSTEROSCOPY W/D&C  12/17/2011   Procedure: DILATATION AND CURETTAGE /HYSTEROSCOPY;  Surgeon: Meriel Pica, MD;  Location: [facility];  Service: Gynecology;  Laterality: N/A;  . LEEP  01/07/2004  . TONSILLECTOMY  09/21/2002    SOCIAL HISTORY: Social History   Tobacco Use  . Smoking status: Never Smoker   . Smokeless tobacco: Never Used  Substance Use Topics  . Alcohol use: Yes    Comment: rare; socially  . Drug use: No    FAMILY HISTORY: Family History  Problem Relation Age of Onset  . Hypertension Other   . Lung cancer Father        Smoker  . Cancer Father        lung/ smoker  . Heart disease Father   . Alcoholism Father   . Breast cancer Maternal Grandmother   . Cancer Maternal Grandmother        breast  . Emphysema Paternal Grandmother   . Asthma Mother   . Hyperlipidemia Mother   . Hypertension Mother   . Sleep apnea Mother   . Asthma Maternal Aunt   . Breast cancer Maternal Aunt        brain   . Breast cancer Paternal Aunt        prostate    ROS: Review of Systems  Constitutional: Negative for weight loss.  Cardiovascular: Negative for chest pain.  Gastrointestinal: Negative for nausea and vomiting.  Musculoskeletal:       Negative for muscle weakness.  Neurological: Negative for headaches.    PHYSICAL EXAM: Blood pressure (!) 154/100, pulse 61, temperature 97.8 F (36.6 C), temperature source Oral, height 5\' 6"  (1.676 m), weight (!) 353 lb (160.1 kg), SpO2 96 %. Body mass index is 56.98 kg/m. Physical Exam  Constitutional: She is oriented to person, place, and time. She appears well-developed and well-nourished.  Cardiovascular: Normal rate.  Pulmonary/Chest: Effort normal.  Musculoskeletal: Normal range of motion.  Neurological: She is oriented to person, place, and time.  Skin: Skin is warm and dry.  Psychiatric: She has a normal mood and affect. Her behavior is normal.  Vitals reviewed.   RECENT LABS AND TESTS: BMET    Component Value Date/Time   NA 142 07/28/2018 1053   K 4.1 07/28/2018 1053   CL 103 07/28/2018 1053   CO2 24 07/28/2018 1053   GLUCOSE 84 07/28/2018 1053   GLUCOSE 103 (H) 12/31/2017 0926   GLUCOSE 83 10/08/2006 1418   BUN 12 07/28/2018 1053   CREATININE 0.97 07/28/2018 1053   CALCIUM 9.2 07/28/2018 1053   GFRNONAA 73  07/28/2018 1053   GFRAA 84 07/28/2018 1053   Lab Results  Component Value Date   HGBA1C 5.7 (H) 07/28/2018   HGBA1C 5.8 (H) 02/26/2018   HGBA1C 5.3 03/31/2015   HGBA1C 6.0 04/20/2011   HGBA1C 5.9 05/24/2010   Lab Results  Component Value Date   INSULIN 14.8 07/28/2018   INSULIN 26.4 (H) 02/26/2018   CBC    Component  Value Date/Time   WBC 5.4 02/26/2018 0936   WBC 5.7 12/31/2017 0926   RBC 4.74 02/26/2018 0936   RBC 4.82 12/31/2017 0926   HGB 13.6 02/26/2018 0936   HCT 42.2 02/26/2018 0936   PLT 281.0 12/31/2017 0926   MCV 89 02/26/2018 0936   MCH 28.7 02/26/2018 0936   MCH 29.3 02/04/2017 1851   MCHC 32.2 02/26/2018 0936   MCHC 33.8 12/31/2017 0926   RDW 14.7 02/26/2018 0936   LYMPHSABS 2.1 02/26/2018 0936   MONOABS 0.3 12/31/2017 0926   EOSABS 0.1 02/26/2018 0936   BASOSABS 0.0 02/26/2018 0936   Iron/TIBC/Ferritin/ %Sat No results found for: IRON, TIBC, FERRITIN, IRONPCTSAT Lipid Panel     Component Value Date/Time   CHOL 191 07/28/2018 1053   TRIG 92 07/28/2018 1053   HDL 47 07/28/2018 1053   CHOLHDL 4 12/31/2017 0926   VLDL 17.2 12/31/2017 0926   LDLCALC 126 (H) 07/28/2018 1053   LDLDIRECT 149.4 05/24/2010 0904   Hepatic Function Panel     Component Value Date/Time   PROT 6.8 07/28/2018 1053   ALBUMIN 4.0 07/28/2018 1053   AST 18 07/28/2018 1053   ALT 16 07/28/2018 1053   ALKPHOS 56 07/28/2018 1053   BILITOT 0.4 07/28/2018 1053   BILIDIR 0.0 12/31/2017 0926      Component Value Date/Time   TSH 2.540 02/26/2018 0936   TSH 1.78 12/31/2017 0926   TSH 1.12 10/01/2013 1056   Results for Spatafore, Kim L (MRN [id number]) as of 11/03/2018 07:53  Ref. Range 07/28/2018 10:53  Vitamin D, 25-Hydroxy Latest Ref Range: 30.0 - 100.0 ng/mL 27.7 (L)   ASSESSMENT AND PLAN: Vitamin D deficiency  PCOS (polycystic ovarian syndrome)  Essential hypertension  Class 3 severe obesity with serious comorbidity and body mass index (BMI) of 50.0 to 59.9 in adult,  unspecified obesity type (HCC)  PLAN:  Vitamin D Deficiency Kim Barnes was informed that low vitamin D levels contributes to fatigue and are associated with obesity, breast, and colon cancer. She agrees to continue to take prescription Vit D @50 ,000 IU every week and will follow up for routine testing of vitamin D, at least 2-3 times per year. She was informed of the risk of over-replacement of vitamin D and agrees to not increase her dose unless she discusses this with [name] first. Kim Barnes agreed to follow up as directed in 2 weeks.  Polycystic Ovarian Syndrome Kim Barnes agrees to continue taking metformin and to follow up as directed in 2 weeks.  Hypertension We discussed sodium restriction, working on healthy weight loss, and a regular exercise program as the means to achieve improved blood pressure control.  We will continue to monitor her blood pressure as well as her progress with the above lifestyle modifications. She will continue her medications as prescribed and not miss doses. She will also decrease sodium and increase water intake in her diet and watch for signs of hypotension as she continues her lifestyle modifications. Kim Barnes agreed with this plan and agreed to follow up as directed.  I spent > than 50% of the 15 minute visit on counseling as documented in the note.  Obesity Kim Barnes is currently in the action stage of change. As such, her goal is to continue with weight loss efforts. She has agreed to follow the Category 2 plan. She was given the "Thanksgiving" and "Eating Out" information handout. Kim Barnes has been instructed to work up to a goal of 150 minutes of combined cardio and strengthening exercise per week for  weight loss and overall health benefits. We discussed the following Behavioral Modification Strategies today: increasing lean protein intake, decreasing simple carbohydrates, increasing vegetables, increase H2O intake, decrease eating out, no skipping meals, and work on meal  planning and easy cooking plans.  Kim Barnes has agreed to follow up with our clinic in 2 weeks for a fasting appointment. She was informed of the importance of frequent follow up visits to maximize her success with intensive lifestyle modifications for her multiple health conditions.   OBESITY BEHAVIORAL INTERVENTION VISIT  Today's visit was # 16   Starting weight: 378 lbs Starting date: 02/26/18 Today's weight : Weight: (!) 353 lb (160.1 kg)  Today's date: 10/28/2018 Total lbs lost to date: 25  ASK: We discussed the diagnosis of obesity with Kim Barnes today and Kim Barnes agreed to give us permission to discuss obesity behavioral modification therapy today.  ASSESS: Kim Barnes has the diagnosis of obesity and her BMI today is 57.0. Kim Barnes is in the action stage of change.   ADVISE: Jimmey RalphCarisa was educated on the multiple health risks of obesity as well as the benefit of weight loss to improve her health. She was advised of the need for long term treatment and the importance of lifestyle modifications to improve her current health and to decrease her risk of future health problems.  AGREE: Multiple dietary modification options and treatment options were discussed and Ariabella agreed to follow the recommendations documented in the above note.  ARRANGE: Nataleigh was educated on the importance of frequent visits to treat obesity as outlined per CMS and USPSTF guidelines and agreed to schedule her next follow up appointment today.  I, Kirke Corinara Soares, am acting as Energy managertranscriptionist for [employer] Corporationngel A. Manson PasseyBrown, DO  I have reviewed the above documentation for accuracy and completeness, and I agree with the above. -Corinna CapraAngel Charon Akamine, DO

## 2018-11-11 ENCOUNTER — Ambulatory Visit (INDEPENDENT_AMBULATORY_CARE_PROVIDER_SITE_OTHER): Payer: 59 | Admitting: Bariatrics

## 2018-11-11 ENCOUNTER — Encounter (INDEPENDENT_AMBULATORY_CARE_PROVIDER_SITE_OTHER): Payer: Self-pay | Admitting: Bariatrics

## 2018-11-11 VITALS — BP 131/88 | HR 69 | Temp 98.0°F | Ht 66.0 in | Wt 352.0 lb

## 2018-11-11 DIAGNOSIS — Z9189 Other specified personal risk factors, not elsewhere classified: Secondary | ICD-10-CM

## 2018-11-11 DIAGNOSIS — E559 Vitamin D deficiency, unspecified: Secondary | ICD-10-CM

## 2018-11-11 DIAGNOSIS — I10 Essential (primary) hypertension: Secondary | ICD-10-CM

## 2018-11-11 DIAGNOSIS — R7303 Prediabetes: Secondary | ICD-10-CM

## 2018-11-11 DIAGNOSIS — Z6841 Body Mass Index (BMI) 40.0 and over, adult: Secondary | ICD-10-CM

## 2018-11-11 MED ORDER — VITAMIN D (ERGOCALCIFEROL) 1.25 MG (50000 UNIT) PO CAPS: ORAL_CAPSULE | ORAL | 0 refills | Status: DC

## 2018-11-11 MED ORDER — AMLODIPINE BESYLATE 5 MG PO TABS: 5.0000 mg | ORAL_TABLET | Freq: Every day | ORAL | 0 refills | Status: DC

## 2018-11-12 LAB — VITAMIN D 25 HYDROXY (VIT D DEFICIENCY, FRACTURES): Vit D, 25-Hydroxy: 21.6 ng/mL — ABNORMAL LOW (ref 30.0–100.0)

## 2018-11-12 LAB — COMPREHENSIVE METABOLIC PANEL
ALK PHOS: 65 IU/L (ref 39–117)
ALT: 16 IU/L (ref 0–32)
AST: 18 IU/L (ref 0–40)
Albumin/Globulin Ratio: 1.2 (ref 1.2–2.2)
Albumin: 4.3 g/dL (ref 3.5–5.5)
BUN/Creatinine Ratio: 14 (ref 9–23)
BUN: 12 mg/dL (ref 6–24)
Bilirubin Total: 0.4 mg/dL (ref 0.0–1.2)
CALCIUM: 9.5 mg/dL (ref 8.7–10.2)
CO2: 25 mmol/L (ref 20–29)
Chloride: 100 mmol/L (ref 96–106)
Creatinine, Ser: 0.87 mg/dL (ref 0.57–1.00)
GFR calc Af Amer: 96 mL/min/{1.73_m2} (ref >59)
GFR, EST NON AFRICAN AMERICAN: 84 mL/min/{1.73_m2} (ref >59)
GLUCOSE: 81 mg/dL (ref 65–99)
Globulin, Total: 3.5 g/dL (ref 1.5–4.5)
Potassium: 3.9 mmol/L (ref 3.5–5.2)
Sodium: 140 mmol/L (ref 134–144)
Total Protein: 7.8 g/dL (ref 6.0–8.5)

## 2018-11-12 LAB — LIPID PANEL WITH LDL/HDL RATIO
Cholesterol, Total: 215 mg/dL — ABNORMAL HIGH (ref 100–199)
HDL: 55 mg/dL (ref >39)
LDL Calculated: 143 mg/dL — ABNORMAL HIGH (ref 0–99)
LDl/HDL Ratio: 2.6 ratio (ref 0.0–3.2)
Triglycerides: 84 mg/dL (ref 0–149)
VLDL Cholesterol Cal: 17 mg/dL (ref 5–40)

## 2018-11-12 LAB — HEMOGLOBIN A1C
ESTIMATED AVERAGE GLUCOSE: 117 mg/dL
HEMOGLOBIN A1C: 5.7 % — AB (ref 4.8–5.6)

## 2018-11-12 LAB — INSULIN, RANDOM: INSULIN: 26.3 u[IU]/mL — AB (ref 2.6–24.9)

## 2018-11-14 NOTE — Progress Notes (Signed)
Office: [phone]  /  Fax: [phone]   HPI:   Chief Complaint: OBESITY Kim Barnes is here to discuss her progress with her obesity treatment plan. She is on the  follow the Category 2 plan and is following her eating plan approximately 30 % of the time. She states she is exercising 0 minutes 0 times per week. Keni has not been stuggling with her meal plan and her appetite is controlled.  Her weight is (!) 352 lb (159.7 kg) today and has had a weight loss of 1 pounds over a period of 2 weeks since her last visit. She has lost 26 lbs since starting treatment with [name].  Vitamin D deficiency Kim Barnes has a diagnosis of vitamin D deficiency. She is currently taking vit D and denies nausea, vomiting or muscle weakness.  Hypertension Kim Barnes is a 42 y.o. female with hypertension.  Kim Barnes denies chest pain or shortness of breath on exertion. She is working weight loss to help control her blood pressure with the goal of decreasing her risk of heart attack and stroke. [name] blood pressure is currently controlled.  Pre-Diabetes Kim Barnes has a diagnosis of prediabetes based on her elevated HgA1c and was informed this puts her at greater risk of developing diabetes. She is taking metformin currently and continues to work on diet and exercise to decrease risk of diabetes. She denies nausea or hypoglycemia.  At risk for diabetes Kim Barnes is at higher than averagerisk for developing diabetes due to her obesity. She currently denies polyuria or polydipsia.   ALLERGIES: No Known Allergies  MEDICATIONS: Current Outpatient Medications on File Prior to Visit  Medication Sig Dispense Refill  . ALBUTEROL IN Inhale into the lungs.    Kim Barnes Kitchen buPROPion (WELLBUTRIN SR) 150 MG 12 hr tablet Take 1 tablet (150 mg total) by mouth daily. 30 tablet 0  . ibuprofen (ADVIL,MOTRIN) 200 MG tablet Take 1,200 mg by mouth daily as needed for moderate pain (tooth pain).    . meloxicam (MOBIC) 15 MG tablet Take 1  tablet (15 mg total) by mouth daily as needed for pain. 30 tablet 1  . metFORMIN (GLUCOPHAGE) 500 MG tablet Take 1 tablet (500 mg total) by mouth daily with breakfast. 30 tablet 0  . Nebivolol HCl (BYSTOLIC) 20 MG TABS Take 1 tablet (20 mg total) by mouth daily. 30 tablet 11  . triamterene-hydrochlorothiazide (MAXZIDE-25) 37.5-25 MG tablet Take 1 tablet by mouth daily. 30 tablet 11   No current facility-administered medications on file prior to visit.     PAST MEDICAL HISTORY: Past Medical History:  Diagnosis Date  . Anxiety   . Asthma    prn inhaler  . Back pain   . Bell's palsy   . Carpal tunnel syndrome of left wrist 01/2013  . Chest pain   . Cold hands and feet   . Cough   . Depression   . Dry mouth   . Dry skin   . Fatigue   . GERD (gastroesophageal reflux disease)    occasional; no current med.  Kim Barnes Kitchen History of brain tumor 1996   no neurologic deficits  . HTN (hypertension)    under control with med., has been on med. x 5 yr.  . Joint pain   . Obesity   . Obstructive sleep apnea   . PCOS (polycystic ovarian syndrome)   . Polycystic ovarian syndrome    no current med.; irregular periods  . Shortness of breath   . Stress   .  Swelling of extremity   . Wheezing     PAST SURGICAL HISTORY: Past Surgical History:  Procedure Laterality Date  . BRAIN SURGERY  1996   exc. tumor  . CARPAL TUNNEL RELEASE Left 02/10/2013   Procedure: CARPAL TUNNEL RELEASE;  Surgeon: Nicki ReaperGary R Kuzma, MD;  Location: [facility];  Service: Orthopedics;  Laterality: Left;  ANESTHESIA: IV REGIONAL FAB  . HYSTEROSCOPY W/D&C  02/21/2006  . HYSTEROSCOPY W/D&C  12/17/2011   Procedure: DILATATION AND CURETTAGE /HYSTEROSCOPY;  Surgeon: Meriel Picaichard M Holland, MD;  Location: [facility];  Service: Gynecology;  Laterality: N/A;  . LEEP  01/07/2004  . TONSILLECTOMY  09/21/2002    SOCIAL HISTORY: Social History   Tobacco Use  . Smoking status: Never Smoker  . Smokeless tobacco: Never Used  Substance  Use Topics  . Alcohol use: Yes    Comment: rare; socially  . Drug use: No    FAMILY HISTORY: Family History  Problem Relation Age of Onset  . Hypertension Other   . Lung cancer Father        Smoker  . Cancer Father        lung/ smoker  . Heart disease Father   . Alcoholism Father   . Breast cancer Maternal Grandmother   . Cancer Maternal Grandmother        breast  . Emphysema Paternal Grandmother   . Asthma Mother   . Hyperlipidemia Mother   . Hypertension Mother   . Sleep apnea Mother   . Asthma Maternal Aunt   . Breast cancer Maternal Aunt        brain   . Breast cancer Paternal Aunt        prostate    ROS: Review of Systems  Constitutional: Positive for weight loss.  All other systems reviewed and are negative.   PHYSICAL EXAM: Blood pressure 131/88, pulse 69, temperature 98 F (36.7 C), temperature source Oral, height 5\' 6"  (1.676 m), weight (!) 352 lb (159.7 kg), last menstrual period 09/25/2018, SpO2 96 %. Body mass index is 56.81 kg/m. Physical Exam  Constitutional: She is oriented to person, place, and time. She appears well-developed and well-nourished.  HENT:  Head: Normocephalic.  Eyes: EOM are normal.  Neck: Normal range of motion.  Pulmonary/Chest: Effort normal.  Neurological: She is alert and oriented to person, place, and time.  Skin: Skin is warm and dry.  Psychiatric: She has a normal mood and affect.  Vitals reviewed.   RECENT LABS AND TESTS: BMET    Component Value Date/Time   NA 140 11/11/2018 0902   K 3.9 11/11/2018 0902   CL 100 11/11/2018 0902   CO2 25 11/11/2018 0902   GLUCOSE 81 11/11/2018 0902   GLUCOSE 103 (H) 12/31/2017 0926   GLUCOSE 83 10/08/2006 1418   BUN 12 11/11/2018 0902   CREATININE 0.87 11/11/2018 0902   CALCIUM 9.5 11/11/2018 0902   GFRNONAA 84 11/11/2018 0902   GFRAA 96 11/11/2018 0902   Lab Results  Component Value Date   HGBA1C 5.7 (H) 11/11/2018   HGBA1C 5.7 (H) 07/28/2018   HGBA1C 5.8 (H)  02/26/2018   HGBA1C 5.3 03/31/2015   HGBA1C 6.0 04/20/2011   Lab Results  Component Value Date   INSULIN 26.3 (H) 11/11/2018   INSULIN 14.8 07/28/2018   INSULIN 26.4 (H) 02/26/2018   CBC    Component Value Date/Time   WBC 5.4 02/26/2018 0936   WBC 5.7 12/31/2017 0926   RBC 4.74 02/26/2018 0936  RBC 4.82 12/31/2017 0926   HGB 13.6 02/26/2018 0936   HCT 42.2 02/26/2018 0936   PLT 281.0 12/31/2017 0926   MCV 89 02/26/2018 0936   MCH 28.7 02/26/2018 0936   MCH 29.3 02/04/2017 1851   MCHC 32.2 02/26/2018 0936   MCHC 33.8 12/31/2017 0926   RDW 14.7 02/26/2018 0936   LYMPHSABS 2.1 02/26/2018 0936   MONOABS 0.3 12/31/2017 0926   EOSABS 0.1 02/26/2018 0936   BASOSABS 0.0 02/26/2018 0936   Iron/TIBC/Ferritin/ %Sat No results found for: IRON, TIBC, FERRITIN, IRONPCTSAT Lipid Panel     Component Value Date/Time   CHOL 215 (H) 11/11/2018 0902   TRIG 84 11/11/2018 0902   HDL 55 11/11/2018 0902   CHOLHDL 4 12/31/2017 0926   VLDL 17.2 12/31/2017 0926   LDLCALC 143 (H) 11/11/2018 0902   LDLDIRECT 149.4 05/24/2010 0904   Hepatic Function Panel     Component Value Date/Time   PROT 7.8 11/11/2018 0902   ALBUMIN 4.3 11/11/2018 0902   AST 18 11/11/2018 0902   ALT 16 11/11/2018 0902   ALKPHOS 65 11/11/2018 0902   BILITOT 0.4 11/11/2018 0902   BILIDIR 0.0 12/31/2017 0926      Component Value Date/Time   TSH 2.540 02/26/2018 0936   TSH 1.78 12/31/2017 0926   TSH 1.12 10/01/2013 1056    ASSESSMENT AND PLAN: Vitamin D deficiency - Plan: VITAMIN D 25 Hydroxy (Vit-D Deficiency, Fractures), Vitamin D, Ergocalciferol, (DRISDOL) 1.25 MG (50000 UT) CAPS capsule  Essential hypertension - Plan: Lipid Panel With LDL/HDL Ratio, amLODipine (NORVASC) 5 MG tablet  Prediabetes - Plan: Comprehensive metabolic panel, Hemoglobin A1c, Insulin, random, Lipid Panel With LDL/HDL Ratio  At risk for diabetes mellitus  Class 3 severe obesity with serious comorbidity and body mass index (BMI) of  50.0 to 59.9 in adult, unspecified obesity type (HCC)  PLAN: Vitamin D Deficiency Arasely was informed that low vitamin D levels contributes to fatigue and are associated with obesity, breast, and colon cancer. She agrees to continue to take prescription Vit D @50 ,000 IU every week in which a prescription was written today for a 30 day supply and will follow up for routine testing of vitamin D, at least 2-3 times per year. She was informed of the risk of over-replacement of vitamin D and agrees to not increase her dose unless she discusses this with [name] first. Will get labs today and follow up.   Hypertension We discussed sodium restriction, working on healthy weight loss, and a regular exercise program as the means to achieve improved blood pressure control. Zinnia agreed with this plan and agreed to follow up as directed. We will continue to monitor her blood pressure as well as her progress with the above lifestyle modifications. She will continue her medications as prescribed and will watch for signs of hypotension as she continues her lifestyle modifications. Renewed a 30 day supply of her Amlodipine today.   Pre-Diabetes Rilei will continue to work on weight loss, exercise, and decreasing simple carbohydrates in her diet to help decrease the risk of diabetes. We dicussed metformin including benefits and risks. She was informed that eating too many simple carbohydrates or too many calories at one sitting increases the likelihood of GI side effects. Dilyn declined metformin for now and a prescription was not written today. Tuere agreed to follow up with [name] as directed to monitor her progress. Will check labs today and follow up.   Diabetes risk counselling Syrianna was given extended (15 minutes) diabetes prevention  counseling today. She is 42 y.o. female and has risk factors for diabetes including obesity. We discussed intensive lifestyle modifications today with an emphasis on weight loss as well  as increasing exercise and decreasing simple carbohydrates in her diet.  Obesity Zendaya is currently in the action stage of change. As such, her goal is to continue with weight loss efforts She has agreed to follow the Category 2 plan Layloni has been instructed to work up to a goal of 150 minutes of combined cardio and strengthening exercise per week for weight loss and overall health benefits. We discussed the following Behavioral Modification Stratagies today: increasing lean protein intake, decreasing simple carbohydrates , increasing vegetables and decrease eating out, no skipping meals and increase H20 intake.    Calysta has agreed to follow up with our clinic in 2 weeks. She was informed of the importance of frequent follow up visits to maximize her success with intensive lifestyle modifications for her multiple health conditions.   OBESITY BEHAVIORAL INTERVENTION VISIT  Today's visit was # 17   Starting weight: 378 lb Starting date: 02/26/18 Today's weight : Weight: (!) 352 lb (159.7 kg)  Today's date: 11/11/18 Total lbs lost to date: 29    ASK: We discussed the diagnosis of obesity with Yen L Dormer today and Copeland agreed to give [name] permission to discuss obesity behavioral modification therapy today.  ASSESS: Amanie has the diagnosis of obesity and her BMI today is 56.9 Stpehanie is in the action stage of change   ADVISE: Ladonna was educated on the multiple health risks of obesity as well as the benefit of weight loss to improve her health. She was advised of the need for long term treatment and the importance of lifestyle modifications to improve her current health and to decrease her risk of future health problems.  AGREE: Multiple dietary modification options and treatment options were discussed and  Alexsa agreed to follow the recommendations documented in the above note.  ARRANGE: Isys was educated on the importance of frequent visits to treat obesity as outlined  per CMS and USPSTF guidelines and agreed to schedule her next follow up appointment today.  I, April Moore, am acting as Energy manager for Corinna Capra, MD.

## 2018-11-17 ENCOUNTER — Encounter (INDEPENDENT_AMBULATORY_CARE_PROVIDER_SITE_OTHER): Payer: Self-pay | Admitting: Bariatrics

## 2018-11-25 ENCOUNTER — Ambulatory Visit (INDEPENDENT_AMBULATORY_CARE_PROVIDER_SITE_OTHER): Payer: 59 | Admitting: Bariatrics

## 2018-11-25 ENCOUNTER — Encounter (INDEPENDENT_AMBULATORY_CARE_PROVIDER_SITE_OTHER): Payer: Self-pay | Admitting: Bariatrics

## 2018-11-25 VITALS — BP 135/89 | HR 66 | Temp 97.9°F | Ht 66.0 in | Wt 356.0 lb

## 2018-11-25 DIAGNOSIS — F3289 Other specified depressive episodes: Secondary | ICD-10-CM | POA: Diagnosis not present

## 2018-11-25 DIAGNOSIS — Z9189 Other specified personal risk factors, not elsewhere classified: Secondary | ICD-10-CM | POA: Diagnosis not present

## 2018-11-25 DIAGNOSIS — Z6841 Body Mass Index (BMI) 40.0 and over, adult: Secondary | ICD-10-CM

## 2018-11-25 DIAGNOSIS — I10 Essential (primary) hypertension: Secondary | ICD-10-CM

## 2018-11-25 DIAGNOSIS — R7303 Prediabetes: Secondary | ICD-10-CM | POA: Diagnosis not present

## 2018-11-25 MED ORDER — BUPROPION HCL ER (SR) 200 MG PO TB12: 200.0000 mg | ORAL_TABLET | Freq: Every day | ORAL | 0 refills | Status: DC

## 2018-11-25 NOTE — Progress Notes (Signed)
Office: [phone]  /  Fax: [phone]   HPI:   Chief Complaint: OBESITY Kim Barnes is here to discuss her progress with her obesity treatment plan. She is on the Category 2 plan and is following her eating plan approximately 40 % of the time. She states she is exercising 0 minutes 0 times per week. Kim Barnes has had increased cravings, primarily for Kim Barnes. She denies hunger, but she has cravings. Her weight is (!) 356 lb (161.5 kg) today and has had a weight gain of 4 pounds over a period of 2 weeks since her last visit. She has lost 22 lbs since starting treatment with [name].  Pre-Diabetes Kim Barnes has a diagnosis of prediabetes based on her elevated Hgb A1c and was informed this puts her at greater risk of developing diabetes. She is taking metformin currently and continues to work on diet and exercise to decrease risk of diabetes. She denies nausea or hypoglycemia.  At risk for diabetes Kim Barnes is at higher than average risk for developing diabetes due to her obesity and prediabetes. She currently denies polyuria or polydipsia.  Hypertension Kim Barnes is a 42 y.o. female with hypertension. She is currently taking 3 agents. Kim Barnes denies chest pain or shortness of breath on exertion. She is working weight loss to help control her blood pressure with the goal of decreasing her risk of heart attack and stroke. Kim Barnes blood pressure is reasonably well controlled.  Depression with emotional eating behaviors Kim Barnes is currently taking Bupropion. She struggles with emotional eating and using food for comfort to the extent that it is negatively impacting her health. She often snacks when she is not hungry. Kim Barnes sometimes feels she is out of control and then feels guilty that she made poor food choices. She has been working on behavior modification techniques to help reduce her emotional eating and has been somewhat successful. She shows no sign of suicidal or homicidal  ideations.  Depression screen PHQ 2/9 02/26/2018  Decreased Interest 2  Down, Depressed, Hopeless 1  PHQ - 2 Score 3  Altered sleeping 2  Tired, decreased energy 2  Change in appetite 2  Feeling bad or failure about yourself  2  Trouble concentrating 1  Moving slowly or fidgety/restless 1  Suicidal thoughts 0  PHQ-9 Score 13  Difficult doing work/chores Somewhat difficult     ASSESSMENT AND PLAN:  Prediabetes  Other depression - with emotional eating  Essential hypertension  At risk for diabetes mellitus  Class 3 severe obesity with serious comorbidity and body mass index (BMI) of 50.0 to 59.9 in adult, unspecified obesity type (HCC)  PLAN:  Pre-Diabetes Kim Barnes will continue to work on weight loss, exercise, and decreasing simple carbohydrates in her diet to help decrease the risk of diabetes. We dicussed metformin including benefits and risks. She was informed that eating too many simple carbohydrates or too many calories at one sitting increases the likelihood of GI side effects. Kim Barnes will continue metformin for now and a prescription was not written today. Kim Barnes agreed to follow up with [name] as directed to monitor her progress.  Diabetes risk counseling Kim Barnes was given extended (15 minutes) diabetes prevention counseling today. She is 42 y.o. female and has risk factors for diabetes including obesity. We discussed intensive lifestyle modifications today with an emphasis on weight loss as well as increasing exercise and decreasing simple carbohydrates in her diet.  Hypertension We discussed sodium restriction, working on healthy weight loss, and a regular exercise  program as the means to achieve improved blood pressure control. Kim Barnes agreed with this plan and agreed to follow up as directed. We will continue to monitor her blood pressure as well as her progress with the above lifestyle modifications. She will continue her medications as prescribed and will watch for signs  of hypotension as she continues her lifestyle modifications.  Depression with Emotional Eating Behaviors We discussed behavior modification techniques today to help Kim Barnes deal with her emotional eating and depression. She has agreed to increase Wellbutrin SR to 200 mg qd #30 with no refills and follow up as directed.  Obesity Kim Barnes is currently in the action stage of change. As such, her goal is to continue with weight loss efforts She has agreed to follow the Category 2 plan and she will get back on the plan completely Kim Barnes has been instructed to work up to a goal of 150 minutes of combined cardio and strengthening exercise per week for weight loss and overall health benefits. We discussed the following Behavioral Modification Strategies today: increase H2O intake, better snacking choices, increasing lean protein intake, decreasing simple carbohydrates , increasing vegetables, work on meal planning and easy cooking plans and emotional eating strategies  Kim Barnes has agreed to follow up with our clinic in 2 weeks. She was informed of the importance of frequent follow up visits to maximize her success with intensive lifestyle modifications for her multiple health conditions.  ALLERGIES: No Known Allergies  MEDICATIONS: Current Outpatient Medications on File Prior to Visit  Medication Sig Dispense Refill  . ALBUTEROL IN Inhale into the lungs.    [name]. amLODipine (NORVASC) 5 MG tablet Take 1 tablet (5 mg total) by mouth daily. 30 tablet 0  . ibuprofen (ADVIL,MOTRIN) 200 MG tablet Take 1,200 mg by mouth daily as needed for moderate pain (tooth pain).    . meloxicam (MOBIC) 15 MG tablet Take 1 tablet (15 mg total) by mouth daily as needed for pain. 30 tablet 1  . metFORMIN (GLUCOPHAGE) 500 MG tablet Take 1 tablet (500 mg total) by mouth daily with breakfast. 30 tablet 0  . Nebivolol HCl (BYSTOLIC) 20 MG TABS Take 1 tablet (20 mg total) by mouth daily. 30 tablet 11  .  triamterene-hydrochlorothiazide (MAXZIDE-25) 37.5-25 MG tablet Take 1 tablet by mouth daily. 30 tablet 11  . Vitamin D, Ergocalciferol, (DRISDOL) 1.25 MG (50000 UT) CAPS capsule TAKE ONE CAPSULE BY MOUTH EVERY 7 DAYS 4 capsule 0   No current facility-administered medications on file prior to visit.     PAST MEDICAL HISTORY: Past Medical History:  Diagnosis Date  . Anxiety   . Asthma    prn inhaler  . Back pain   . Bell's palsy   . Carpal tunnel syndrome of left wrist 01/2013  . Chest pain   . Cold hands and feet   . Cough   . Depression   . Dry mouth   . Dry skin   . Fatigue   . GERD (gastroesophageal reflux disease)    occasional; no current med.  [name]. History of brain tumor 1996   no neurologic deficits  . HTN (hypertension)    under control with med., has been on med. x 5 yr.  . Joint pain   . Obesity   . Obstructive sleep apnea   . PCOS (polycystic ovarian syndrome)   . Polycystic ovarian syndrome    no current med.; irregular periods  . Shortness of breath   . Stress   . Swelling of  extremity   . Wheezing     PAST SURGICAL HISTORY: Past Surgical History:  Procedure Laterality Date  . BRAIN SURGERY  1996   exc. tumor  . CARPAL TUNNEL RELEASE Left 02/10/2013   Procedure: CARPAL TUNNEL RELEASE;  Surgeon: Nicki Reaper, MD;  Location: [facility];  Service: Orthopedics;  Laterality: Left;  ANESTHESIA: IV REGIONAL FAB  . HYSTEROSCOPY W/D&C  02/21/2006  . HYSTEROSCOPY W/D&C  12/17/2011   Procedure: DILATATION AND CURETTAGE /HYSTEROSCOPY;  Surgeon: Meriel Pica, MD;  Location: [facility];  Service: Gynecology;  Laterality: N/A;  . LEEP  01/07/2004  . TONSILLECTOMY  09/21/2002    SOCIAL HISTORY: Social History   Tobacco Use  . Smoking status: Never Smoker  . Smokeless tobacco: Never Used  Substance Use Topics  . Alcohol use: Yes    Comment: rare; socially  . Drug use: No    FAMILY HISTORY: Family History  Problem Relation Age of Onset  .  Hypertension Other   . Lung cancer Father        Smoker  . Cancer Father        lung/ smoker  . Heart disease Father   . Alcoholism Father   . Breast cancer Maternal Grandmother   . Cancer Maternal Grandmother        breast  . Emphysema Paternal Grandmother   . Asthma Mother   . Hyperlipidemia Mother   . Hypertension Mother   . Sleep apnea Mother   . Asthma Maternal Aunt   . Breast cancer Maternal Aunt        brain   . Breast cancer Paternal Aunt        prostate    ROS: Review of Systems  Constitutional: Negative for weight loss.  Respiratory: Negative for shortness of breath (on exertion).   Cardiovascular: Negative for chest pain.  Gastrointestinal: Negative for nausea.  Genitourinary: Negative for frequency.  Endo/Heme/Allergies: Negative for polydipsia.       Negative for polyphagia Positive for cravings Negative for hypoglycemia  Psychiatric/Behavioral: Positive for depression. Negative for suicidal ideas.    PHYSICAL EXAM: Blood pressure 135/89, pulse 66, temperature 97.9 F (36.6 C), temperature source Oral, height 5\' 6"  (1.676 m), weight (!) 356 lb (161.5 kg), SpO2 96 %. Body mass index is 57.46 kg/m. Physical Exam Vitals signs reviewed.  Constitutional:      Appearance: Normal appearance. She is well-developed. She is obese.  Cardiovascular:     Rate and Rhythm: Normal rate.  Pulmonary:     Effort: Pulmonary effort is normal.  Musculoskeletal: Normal range of motion.  Skin:    General: Skin is warm and dry.  Neurological:     Mental Status: She is alert and oriented to person, place, and time.  Psychiatric:        Mood and Affect: Mood normal.        Behavior: Behavior normal.        Thought Content: Thought content does not include homicidal or suicidal ideation.     RECENT LABS AND TESTS: BMET    Component Value Date/Time   NA 140 11/11/2018 0902   K 3.9 11/11/2018 0902   CL 100 11/11/2018 0902   CO2 25 11/11/2018 0902   GLUCOSE 81  11/11/2018 0902   GLUCOSE 103 (H) 12/31/2017 0926   GLUCOSE 83 10/08/2006 1418   BUN 12 11/11/2018 0902   CREATININE 0.87 11/11/2018 0902   CALCIUM 9.5 11/11/2018 0902   GFRNONAA 84 11/11/2018 0902  GFRAA 96 11/11/2018 0902   Lab Results  Component Value Date   HGBA1C 5.7 (H) 11/11/2018   HGBA1C 5.7 (H) 07/28/2018   HGBA1C 5.8 (H) 02/26/2018   HGBA1C 5.3 03/31/2015   HGBA1C 6.0 04/20/2011   Lab Results  Component Value Date   INSULIN 26.3 (H) 11/11/2018   INSULIN 14.8 07/28/2018   INSULIN 26.4 (H) 02/26/2018   CBC    Component Value Date/Time   WBC 5.4 02/26/2018 0936   WBC 5.7 12/31/2017 0926   RBC 4.74 02/26/2018 0936   RBC 4.82 12/31/2017 0926   HGB 13.6 02/26/2018 0936   HCT 42.2 02/26/2018 0936   PLT 281.0 12/31/2017 0926   MCV 89 02/26/2018 0936   MCH 28.7 02/26/2018 0936   MCH 29.3 02/04/2017 1851   MCHC 32.2 02/26/2018 0936   MCHC 33.8 12/31/2017 0926   RDW 14.7 02/26/2018 0936   LYMPHSABS 2.1 02/26/2018 0936   MONOABS 0.3 12/31/2017 0926   EOSABS 0.1 02/26/2018 0936   BASOSABS 0.0 02/26/2018 0936   Iron/TIBC/Ferritin/ %Sat No results found for: IRON, TIBC, FERRITIN, IRONPCTSAT Lipid Panel     Component Value Date/Time   CHOL 215 (H) 11/11/2018 0902   TRIG 84 11/11/2018 0902   HDL 55 11/11/2018 0902   CHOLHDL 4 12/31/2017 0926   VLDL 17.2 12/31/2017 0926   LDLCALC 143 (H) 11/11/2018 0902   LDLDIRECT 149.4 05/24/2010 0904   Hepatic Function Panel     Component Value Date/Time   PROT 7.8 11/11/2018 0902   ALBUMIN 4.3 11/11/2018 0902   AST 18 11/11/2018 0902   ALT 16 11/11/2018 0902   ALKPHOS 65 11/11/2018 0902   BILITOT 0.4 11/11/2018 0902   BILIDIR 0.0 12/31/2017 0926      Component Value Date/Time   TSH 2.540 02/26/2018 0936   TSH 1.78 12/31/2017 0926   TSH 1.12 10/01/2013 1056    Ref. Range 11/11/2018 09:02  Vitamin D, 25-Hydroxy Latest Ref Range: 30.0 - 100.0 ng/mL 21.6 (L)     OBESITY BEHAVIORAL INTERVENTION VISIT  Today's  visit was # 18   Starting weight: 378 lbs Starting date: 02/26/2018 Today's weight : 356 lbs Today's date: 11/25/2018 Total lbs lost to date: 22   ASK: We discussed the diagnosis of obesity with Kim Barnes today and Kim Barnes agreed to give [name] permission to discuss obesity behavioral modification therapy today.  ASSESS: Kim Barnes has the diagnosis of obesity and her BMI today is 57.49 Kim Barnes is in the action stage of change   ADVISE: Kim Barnes was educated on the multiple health risks of obesity as well as the benefit of weight loss to improve her health. She was advised of the need for long term treatment and the importance of lifestyle modifications to improve her current health and to decrease her risk of future health problems.  AGREE: Multiple dietary modification options and treatment options were discussed and  Kim Barnes agreed to follow the recommendations documented in the above note.  ARRANGE: Kim Barnes was educated on the importance of frequent visits to treat obesity as outlined per CMS and USPSTF guidelines and agreed to schedule her next follow up appointment today.  Cristi Loron, am acting as Energy manager for [place] [facility]. Manson Passey, DO  I have reviewed the above documentation for accuracy and completeness, and I agree with the above. -Corinna Capra, DO

## 2018-12-15 ENCOUNTER — Ambulatory Visit (INDEPENDENT_AMBULATORY_CARE_PROVIDER_SITE_OTHER): Payer: 59 | Admitting: Bariatrics

## 2018-12-15 ENCOUNTER — Encounter (INDEPENDENT_AMBULATORY_CARE_PROVIDER_SITE_OTHER): Payer: Self-pay | Admitting: Bariatrics

## 2018-12-15 VITALS — BP 136/87 | HR 67 | Temp 98.3°F | Ht 66.0 in | Wt 360.0 lb

## 2018-12-15 DIAGNOSIS — F3289 Other specified depressive episodes: Secondary | ICD-10-CM | POA: Diagnosis not present

## 2018-12-15 DIAGNOSIS — I10 Essential (primary) hypertension: Secondary | ICD-10-CM

## 2018-12-15 DIAGNOSIS — R7303 Prediabetes: Secondary | ICD-10-CM

## 2018-12-15 DIAGNOSIS — Z6841 Body Mass Index (BMI) 40.0 and over, adult: Secondary | ICD-10-CM

## 2018-12-15 NOTE — Progress Notes (Signed)
Office: [phone]  /  Fax: [phone]   HPI:   Chief Complaint: OBESITY Kim Barnes is here to discuss her progress with her obesity treatment plan. She is on the Category 2 plan and is following her eating plan approximately 40 % of the time. She states she is exercising 0 minutes 0 times per week. Kim Barnes has struggled over the holidays. She is doing well with water. Her weight is (!) 360 lb (163.3 kg) today and has had a weight gain of 4 pounds over a period of 3 weeks since her last visit. She has lost 18 lbs since starting treatment with [name].  Pre-Diabetes Kim Barnes has a diagnosis of prediabetes based on her elevated Hgb A1c and was informed this puts her at greater risk of developing diabetes. She is taking metformin currently without any side effects. Kim Barnes continues to work on diet and exercise to decrease risk of diabetes.   Hypertension Kim Barnes is a 43 y.o. female with hypertension. She is taking amlodipine and triamterene-HCTZ. Kim Barnes denies chest pain or headaches. She is working weight loss to help control her blood pressure with the goal of decreasing her risk of heart attack and stroke. [name] blood pressure is reasonably well controlled.  Depression with emotional eating behaviors Kim Barnes is struggling with emotional eating and using food for comfort to the extent that it is negatively impacting her health. She often snacks when she is not hungry. Kim Barnes sometimes feels she is out of control and then feels guilty that she made poor food choices. She has been working on behavior modification techniques to help reduce her emotional eating and has been somewhat successful. She shows no sign of suicidal or homicidal ideations.  Depression screen PHQ 2/9 02/26/2018  Decreased Interest 2  Down, Depressed, Hopeless 1  PHQ - 2 Score 3  Altered sleeping 2  Tired, decreased energy 2  Change in appetite 2  Feeling bad or failure about yourself  2  Trouble concentrating  1  Moving slowly or fidgety/restless 1  Suicidal thoughts 0  PHQ-9 Score 13  Difficult doing work/chores Somewhat difficult     ASSESSMENT AND PLAN:  Prediabetes  Other depression - with emotional eating  Essential hypertension  Class 3 severe obesity with serious comorbidity and body mass index (BMI) of 50.0 to 59.9 in adult, unspecified obesity type (HCC)  PLAN:  Pre-Diabetes Kim Barnes will continue to work on weight loss, exercise, and decreasing simple carbohydrates in her diet to help decrease the risk of diabetes. We dicussed metformin including benefits and risks. She was informed that eating too many simple carbohydrates or too many calories at one sitting increases the likelihood of GI side effects. Kim Barnes will continue metformin for now and a prescription was not written today. Kim Barnes agreed to follow up with [name] as directed to monitor her progress.  Hypertension We discussed sodium restriction, working on healthy weight loss, and a regular exercise program as the means to achieve improved blood pressure control. [name] agreed with this plan and agreed to follow up as directed. We will continue to monitor her blood pressure as well as her progress with the above lifestyle modifications. She will continue her medications as prescribed and will watch for signs of hypotension as she continues her lifestyle modifications.  Depression with Emotional Eating Behaviors We discussed behavior modification techniques today to help Kim Barnes deal with her emotional eating and depression. She has agreed to continue Bupropion (Wellbutrin SR) 200 mg qd and agreed to follow  up as directed.  I spent > than 50% of the 15 minute visit on counseling as documented in the note.  Obesity Kim Barnes is currently in the action stage of change. As such, her goal is to continue with weight loss efforts She has agreed to follow the Category 2 plan Kim Barnes will start the gym doing cardio, with the goal of  walking 1 mile and buildup, for weight loss and overall health benefits. We discussed the following Behavioral Modification Strategies today: work on meal planning and easy cooking plans  Handouts for 100 calorie snack ideas and microwave meals were provided to patient today.  Kim Barnes has agreed to follow up with our clinic in 2 weeks. She was informed of the importance of frequent follow up visits to maximize her success with intensive lifestyle modifications for her multiple health conditions.  ALLERGIES: No Known Allergies  MEDICATIONS: Current Outpatient Medications on File Prior to Visit  Medication Sig Dispense Refill  . ALBUTEROL IN Inhale into the lungs.    Kim Barnes. amLODipine (NORVASC) 5 MG tablet Take 1 tablet (5 mg total) by mouth daily. 30 tablet 0  . buPROPion (WELLBUTRIN SR) 200 MG 12 hr tablet Take 1 tablet (200 mg total) by mouth daily. 30 tablet 0  . ibuprofen (ADVIL,MOTRIN) 200 MG tablet Take 1,200 mg by mouth daily as needed for moderate pain (tooth pain).    . meloxicam (MOBIC) 15 MG tablet Take 1 tablet (15 mg total) by mouth daily as needed for pain. 30 tablet 1  . metFORMIN (GLUCOPHAGE) 500 MG tablet Take 1 tablet (500 mg total) by mouth daily with breakfast. 30 tablet 0  . Nebivolol HCl (BYSTOLIC) 20 MG TABS Take 1 tablet (20 mg total) by mouth daily. 30 tablet 11  . triamterene-hydrochlorothiazide (MAXZIDE-25) 37.5-25 MG tablet Take 1 tablet by mouth daily. 30 tablet 11  . Vitamin D, Ergocalciferol, (DRISDOL) 1.25 MG (50000 UT) CAPS capsule TAKE ONE CAPSULE BY MOUTH EVERY 7 DAYS 4 capsule 0   No current facility-administered medications on file prior to visit.     PAST MEDICAL HISTORY: Past Medical History:  Diagnosis Date  . Anxiety   . Asthma    prn inhaler  . Back pain   . Bell's palsy   . Carpal tunnel syndrome of left wrist 01/2013  . Chest pain   . Cold hands and feet   . Cough   . Depression   . Dry mouth   . Dry skin   . Fatigue   . GERD  (gastroesophageal reflux disease)    occasional; no current med.  Kim Barnes. History of brain tumor 1996   no neurologic deficits  . HTN (hypertension)    under control with med., has been on med. x 5 yr.  . Joint pain   . Obesity   . Obstructive sleep apnea   . PCOS (polycystic ovarian syndrome)   . Polycystic ovarian syndrome    no current med.; irregular periods  . Shortness of breath   . Stress   . Swelling of extremity   . Wheezing     PAST SURGICAL HISTORY: Past Surgical History:  Procedure Laterality Date  . BRAIN SURGERY  1996   exc. tumor  . CARPAL TUNNEL RELEASE Left 02/10/2013   Procedure: CARPAL TUNNEL RELEASE;  Surgeon: Nicki ReaperGary R Kuzma, MD;  Location: [facility];  Service: Orthopedics;  Laterality: Left;  ANESTHESIA: IV REGIONAL FAB  . HYSTEROSCOPY W/D&C  02/21/2006  . HYSTEROSCOPY W/D&C  12/17/2011  Procedure: DILATATION AND CURETTAGE /HYSTEROSCOPY;  Surgeon: Meriel Picaichard M Holland, MD;  Location: [facility];  Service: Gynecology;  Laterality: N/A;  . LEEP  01/07/2004  . TONSILLECTOMY  09/21/2002    SOCIAL HISTORY: Social History   Tobacco Use  . Smoking status: Never Smoker  . Smokeless tobacco: Never Used  Substance Use Topics  . Alcohol use: Yes    Comment: rare; socially  . Drug use: No    FAMILY HISTORY: Family History  Problem Relation Age of Onset  . Hypertension Other   . Lung cancer Father        Smoker  . Cancer Father        lung/ smoker  . Heart disease Father   . Alcoholism Father   . Breast cancer Maternal Grandmother   . Cancer Maternal Grandmother        breast  . Emphysema Paternal Grandmother   . Asthma Mother   . Hyperlipidemia Mother   . Hypertension Mother   . Sleep apnea Mother   . Asthma Maternal Aunt   . Breast cancer Maternal Aunt        brain   . Breast cancer Paternal Aunt        prostate    ROS: Review of Systems  Constitutional: Negative for weight loss.  Cardiovascular: Negative for chest pain.    Gastrointestinal: Negative for diarrhea, nausea and vomiting.  Neurological: Negative for headaches.  Psychiatric/Behavioral: Positive for depression. Negative for suicidal ideas.    PHYSICAL EXAM: Blood pressure 136/87, pulse 67, temperature 98.3 F (36.8 C), temperature source Oral, height 5\' 6"  (1.676 m), weight (!) 360 lb (163.3 kg), SpO2 97 %. Body mass index is 58.11 kg/m. Physical Exam Vitals signs reviewed.  Constitutional:      Appearance: Normal appearance. She is well-developed. She is obese.  Cardiovascular:     Rate and Rhythm: Normal rate.  Pulmonary:     Effort: Pulmonary effort is normal.  Musculoskeletal: Normal range of motion.  Skin:    General: Skin is warm and dry.  Neurological:     Mental Status: She is alert and oriented to person, place, and time.  Psychiatric:        Mood and Affect: Mood normal.        Behavior: Behavior normal.        Thought Content: Thought content does not include homicidal or suicidal ideation.     RECENT LABS AND TESTS: BMET    Component Value Date/Time   NA 140 11/11/2018 0902   K 3.9 11/11/2018 0902   CL 100 11/11/2018 0902   CO2 25 11/11/2018 0902   GLUCOSE 81 11/11/2018 0902   GLUCOSE 103 (H) 12/31/2017 0926   GLUCOSE 83 10/08/2006 1418   BUN 12 11/11/2018 0902   CREATININE 0.87 11/11/2018 0902   CALCIUM 9.5 11/11/2018 0902   GFRNONAA 84 11/11/2018 0902   GFRAA 96 11/11/2018 0902   Lab Results  Component Value Date   HGBA1C 5.7 (H) 11/11/2018   HGBA1C 5.7 (H) 07/28/2018   HGBA1C 5.8 (H) 02/26/2018   HGBA1C 5.3 03/31/2015   HGBA1C 6.0 04/20/2011   Lab Results  Component Value Date   INSULIN 26.3 (H) 11/11/2018   INSULIN 14.8 07/28/2018   INSULIN 26.4 (H) 02/26/2018   CBC    Component Value Date/Time   WBC 5.4 02/26/2018 0936   WBC 5.7 12/31/2017 0926   RBC 4.74 02/26/2018 0936   RBC 4.82 12/31/2017 0926   HGB 13.6 02/26/2018 0936  HCT 42.2 02/26/2018 0936   PLT 281.0 12/31/2017 0926   MCV 89  02/26/2018 0936   MCH 28.7 02/26/2018 0936   MCH 29.3 02/04/2017 1851   MCHC 32.2 02/26/2018 0936   MCHC 33.8 12/31/2017 0926   RDW 14.7 02/26/2018 0936   LYMPHSABS 2.1 02/26/2018 0936   MONOABS 0.3 12/31/2017 0926   EOSABS 0.1 02/26/2018 0936   BASOSABS 0.0 02/26/2018 0936   Iron/TIBC/Ferritin/ %Sat No results found for: IRON, TIBC, FERRITIN, IRONPCTSAT Lipid Panel     Component Value Date/Time   CHOL 215 (H) 11/11/2018 0902   TRIG 84 11/11/2018 0902   HDL 55 11/11/2018 0902   CHOLHDL 4 12/31/2017 0926   VLDL 17.2 12/31/2017 0926   LDLCALC 143 (H) 11/11/2018 0902   LDLDIRECT 149.4 05/24/2010 0904   Hepatic Function Panel     Component Value Date/Time   PROT 7.8 11/11/2018 0902   ALBUMIN 4.3 11/11/2018 0902   AST 18 11/11/2018 0902   ALT 16 11/11/2018 0902   ALKPHOS 65 11/11/2018 0902   BILITOT 0.4 11/11/2018 0902   BILIDIR 0.0 12/31/2017 0926      Component Value Date/Time   TSH 2.540 02/26/2018 0936   TSH 1.78 12/31/2017 0926   TSH 1.12 10/01/2013 1056   Results for Zurawski, Eilyn L (MRN [id number]) as of 12/15/2018 17:10  Ref. Range 11/11/2018 09:02  Vitamin D, 25-Hydroxy Latest Ref Range: 30.0 - 100.0 ng/mL 21.6 (L)     OBESITY BEHAVIORAL INTERVENTION VISIT  Today's visit was # 19   Starting weight: 378 lbs Starting date: 02/26/2018 Today's weight : 360 lbs Today's date: 12/15/2018 Total lbs lost to date: 42   ASK: We discussed the diagnosis of obesity with Judeth L Rayner today and Madolin agreed to give [name] permission to discuss obesity behavioral modification therapy today.  ASSESS: Carleena has the diagnosis of obesity and her BMI today is 58.13 Donnia is in the action stage of change   ADVISE: Emoni was educated on the multiple health risks of obesity as well as the benefit of weight loss to improve her health. She was advised of the need for long term treatment and the importance of lifestyle modifications to improve her current health and to decrease  her risk of future health problems.  AGREE: Multiple dietary modification options and treatment options were discussed and  Kyaira agreed to follow the recommendations documented in the above note.  ARRANGE: Jailene was educated on the importance of frequent visits to treat obesity as outlined per CMS and USPSTF guidelines and agreed to schedule her next follow up appointment today.  Cristi Loron, am acting as Energy manager for [employer]. Manson Passey, DO  I have reviewed the above documentation for accuracy and completeness, and I agree with the above. -Corinna Capra, DO

## 2018-12-16 ENCOUNTER — Ambulatory Visit (INDEPENDENT_AMBULATORY_CARE_PROVIDER_SITE_OTHER): Payer: 59 | Admitting: Internal Medicine

## 2018-12-16 ENCOUNTER — Encounter: Payer: Self-pay | Admitting: Internal Medicine

## 2018-12-16 VITALS — BP 132/84 | HR 60 | Temp 98.3°F | Ht 66.0 in | Wt 364.0 lb

## 2018-12-16 DIAGNOSIS — J4521 Mild intermittent asthma with (acute) exacerbation: Secondary | ICD-10-CM

## 2018-12-16 DIAGNOSIS — K219 Gastro-esophageal reflux disease without esophagitis: Secondary | ICD-10-CM

## 2018-12-16 DIAGNOSIS — I10 Essential (primary) hypertension: Secondary | ICD-10-CM

## 2018-12-16 DIAGNOSIS — E559 Vitamin D deficiency, unspecified: Secondary | ICD-10-CM | POA: Diagnosis not present

## 2018-12-16 MED ORDER — PROMETHAZINE-CODEINE 6.25-10 MG/5ML PO SYRP: 5.0000 mL | ORAL_SOLUTION | ORAL | 0 refills | Status: DC | PRN

## 2018-12-16 MED ORDER — BENZONATATE 200 MG PO CAPS: 200.0000 mg | ORAL_CAPSULE | Freq: Two times a day (BID) | ORAL | 1 refills | Status: DC | PRN

## 2018-12-16 MED ORDER — BUDESONIDE-FORMOTEROL FUMARATE 160-4.5 MCG/ACT IN AERO: 2.0000 | INHALATION_SPRAY | Freq: Two times a day (BID) | RESPIRATORY_TRACT | 3 refills | Status: DC

## 2018-12-16 MED ORDER — METHYLPREDNISOLONE 4 MG PO TBPK: ORAL_TABLET | ORAL | 0 refills | Status: DC

## 2018-12-16 NOTE — Assessment & Plan Note (Signed)
Medrol pack Start Symbicort Tesssalon Prom - cod syr

## 2018-12-16 NOTE — Assessment & Plan Note (Signed)
Bystolic Triamt-HCT 

## 2018-12-16 NOTE — Progress Notes (Signed)
Subjective:  Patient ID: Kim Barnes, female    DOB: 12/20/1975  Age: 43 y.o. MRN: [id number]  CC: No chief complaint on file.   HPI Kim Barnes presents for a cough x 3 wks. It started after a URI. Worse in daytime  Outpatient Medications Prior to Visit  Medication Sig Dispense Refill  . ALBUTEROL IN Inhale into the lungs.    [name]. amLODipine (NORVASC) 5 MG tablet Take 1 tablet (5 mg total) by mouth daily. 30 tablet 0  . buPROPion (WELLBUTRIN SR) 200 MG 12 hr tablet Take 1 tablet (200 mg total) by mouth daily. 30 tablet 0  . ibuprofen (ADVIL,MOTRIN) 200 MG tablet Take 1,200 mg by mouth daily as needed for moderate pain (tooth pain).    . meloxicam (MOBIC) 15 MG tablet Take 1 tablet (15 mg total) by mouth daily as needed for pain. 30 tablet 1  . metFORMIN (GLUCOPHAGE) 500 MG tablet Take 1 tablet (500 mg total) by mouth daily with breakfast. 30 tablet 0  . Nebivolol HCl (BYSTOLIC) 20 MG TABS Take 1 tablet (20 mg total) by mouth daily. 30 tablet 11  . triamterene-hydrochlorothiazide (MAXZIDE-25) 37.5-25 MG tablet Take 1 tablet by mouth daily. 30 tablet 11  . Vitamin D, Ergocalciferol, (DRISDOL) 1.25 MG (50000 UT) CAPS capsule TAKE ONE CAPSULE BY MOUTH EVERY 7 DAYS 4 capsule 0   No facility-administered medications prior to visit.     ROS: Review of Systems  Constitutional: Negative.  Negative for activity change, appetite change, chills, diaphoresis, fatigue, fever and unexpected weight change.  HENT: Negative for congestion, ear pain, facial swelling, hearing loss, mouth sores, nosebleeds, postnasal drip, rhinorrhea, sinus pressure, sneezing, sore throat, tinnitus and trouble swallowing.   Eyes: Negative for pain, discharge, redness, itching and visual disturbance.  Respiratory: Positive for cough, shortness of breath and wheezing. Negative for chest tightness and stridor.   Cardiovascular: Negative for chest pain, palpitations and leg swelling.  Gastrointestinal: Negative for  abdominal distention, anal bleeding, blood in stool, constipation, diarrhea, nausea and rectal pain.  Genitourinary: Negative for difficulty urinating, dysuria, flank pain, frequency, genital sores, hematuria, pelvic pain, urgency, vaginal bleeding and vaginal discharge.  Musculoskeletal: Negative for arthralgias, back pain, gait problem, joint swelling, neck pain and neck stiffness.  Skin: Negative.  Negative for rash.  Neurological: Negative for dizziness, tremors, seizures, syncope, speech difficulty, weakness, numbness and headaches.  Hematological: Negative for adenopathy. Does not bruise/bleed easily.  Psychiatric/Behavioral: Negative for behavioral problems, decreased concentration, dysphoric mood, sleep disturbance and suicidal ideas. The patient is not nervous/anxious.     Objective:  BP 132/84 (BP Location: Left Arm, Patient Position: Sitting, Cuff Size: Large)   Pulse 60   Temp 98.3 F (36.8 C) (Oral)   Ht 5\' 6"  (1.676 m)   Wt (!) 364 lb (165.1 kg)   SpO2 96%   BMI 58.75 kg/m   BP Readings from Last 3 Encounters:  12/16/18 132/84  12/15/18 136/87  11/25/18 135/89    Wt Readings from Last 3 Encounters:  12/16/18 (!) 364 lb (165.1 kg)  12/15/18 (!) 360 lb (163.3 kg)  11/25/18 (!) 356 lb (161.5 kg)    Physical Exam Constitutional:      General: She is not in acute distress.    Appearance: She is well-developed.  HENT:     Head: Normocephalic.     Right Ear: External ear normal.     Left Ear: External ear normal.     Nose: Nose normal.  Eyes:  General:        Right eye: No discharge.        Left eye: No discharge.     Conjunctiva/sclera: Conjunctivae normal.     Pupils: Pupils are equal, round, and reactive to light.  Neck:     Musculoskeletal: Normal range of motion and neck supple.     Thyroid: No thyromegaly.     Vascular: No JVD.     Trachea: No tracheal deviation.  Cardiovascular:     Rate and Rhythm: Normal rate and regular rhythm.     Heart  sounds: Normal heart sounds.  Pulmonary:     Effort: No respiratory distress.     Breath sounds: No stridor. No wheezing.  Abdominal:     General: Bowel sounds are normal. There is no distension.     Palpations: Abdomen is soft. There is no mass.     Tenderness: There is no abdominal tenderness. There is no guarding or rebound.  Musculoskeletal:        General: No tenderness.  Lymphadenopathy:     Cervical: No cervical adenopathy.  Skin:    Findings: No erythema or rash.  Neurological:     Cranial Nerves: No cranial nerve deficit.     Motor: No abnormal muscle tone.     Coordination: Coordination normal.     Deep Tendon Reflexes: Reflexes normal.  Psychiatric:        Behavior: Behavior normal.        Thought Content: Thought content normal.        Judgment: Judgment normal.     Lab Results  Component Value Date   WBC 5.4 02/26/2018   HGB 13.6 02/26/2018   HCT 42.2 02/26/2018   PLT 281.0 12/31/2017   GLUCOSE 81 11/11/2018   CHOL 215 (H) 11/11/2018   TRIG 84 11/11/2018   HDL 55 11/11/2018   LDLDIRECT 149.4 05/24/2010   LDLCALC 143 (H) 11/11/2018   ALT 16 11/11/2018   AST 18 11/11/2018   NA 140 11/11/2018   K 3.9 11/11/2018   CL 100 11/11/2018   CREATININE 0.87 11/11/2018   BUN 12 11/11/2018   CO2 25 11/11/2018   TSH 2.540 02/26/2018   INR 1.16 02/04/2017   HGBA1C 5.7 (H) 11/11/2018    Dg Thoracic Spine 2 View  Result Date: 04/29/2018 CLINICAL DATA:  43 year old with chronic bilateral thoracic pain. EXAM: THORACIC SPINE 2 VIEWS COMPARISON:  Chest radiograph 12/03/2017 FINDINGS: Mild degenerative disc and endplate disease at C5-C6. Normal alignment at the cervicothoracic junction. Thoracic vertebral body heights are maintained. Normal alignment in the thoracic spine. Visualized ribs are intact. IMPRESSION: No acute abnormality to the thoracic spine. Mild degenerative disease in lower cervical spine. Electronically Signed   By: Richarda Overlie M.D.   On: 04/29/2018 08:46    Dg Lumbar Spine 2-3 Views  Result Date: 04/29/2018 CLINICAL DATA:  Low back pain, no known injury, initial encounter EXAM: LUMBAR SPINE - 2-3 VIEW COMPARISON:  11/11/2017 FINDINGS: Five lumbar type vertebral bodies are well visualized. Vertebral body height is well maintained. Mild osteophytic changes are noted. Disc space narrowing is seen at L4-5 with very minimal anterolisthesis of L4 on L5. No soft tissue changes are noted. IMPRESSION: Mild degenerative change without acute abnormality. Electronically Signed   By: Alcide Clever M.D.   On: 04/29/2018 08:43    Assessment & Plan:   There are no diagnoses linked to this encounter.   No orders of the defined types were placed in this  encounter.    Follow-up: No follow-ups on file.  Walker Kehr, MD

## 2018-12-16 NOTE — Assessment & Plan Note (Signed)
Doing well 

## 2018-12-16 NOTE — Assessment & Plan Note (Signed)
Vit d 

## 2018-12-17 ENCOUNTER — Telehealth: Payer: Self-pay | Admitting: Internal Medicine

## 2018-12-17 NOTE — Telephone Encounter (Signed)
Please advise 

## 2018-12-17 NOTE — Telephone Encounter (Signed)
Copied from CRM [id number]. Topic: Quick Communication - Rx Refill/Question >> Dec 17, 2018  3:24 PM Maia Petties wrote: Medication: promethazine-codeine (PHENERGAN WITH CODEINE) 6.25-10 MG/5ML syrup - ALL promethazine products are on backorder and not available. Please advise of alternative.   Preferred Pharmacy (with phone number or street name): [id number] - [place], [place] - [address] AT [facility] [address] [phone] (Phone) [phone] (Fax)

## 2018-12-18 NOTE — Telephone Encounter (Signed)
Ok Robutussin AC Thx

## 2018-12-18 NOTE — Telephone Encounter (Signed)
Pt.notified

## 2018-12-29 ENCOUNTER — Encounter (INDEPENDENT_AMBULATORY_CARE_PROVIDER_SITE_OTHER): Payer: Self-pay | Admitting: Bariatrics

## 2018-12-29 ENCOUNTER — Ambulatory Visit (INDEPENDENT_AMBULATORY_CARE_PROVIDER_SITE_OTHER): Payer: 59 | Admitting: Bariatrics

## 2018-12-29 VITALS — BP 133/86 | HR 77 | Temp 97.5°F | Ht 66.0 in | Wt 360.0 lb

## 2018-12-29 DIAGNOSIS — R059 Cough, unspecified: Secondary | ICD-10-CM

## 2018-12-29 DIAGNOSIS — F3289 Other specified depressive episodes: Secondary | ICD-10-CM | POA: Diagnosis not present

## 2018-12-29 DIAGNOSIS — I10 Essential (primary) hypertension: Secondary | ICD-10-CM | POA: Diagnosis not present

## 2018-12-29 DIAGNOSIS — Z9189 Other specified personal risk factors, not elsewhere classified: Secondary | ICD-10-CM

## 2018-12-29 DIAGNOSIS — R7303 Prediabetes: Secondary | ICD-10-CM

## 2018-12-29 DIAGNOSIS — Z6841 Body Mass Index (BMI) 40.0 and over, adult: Secondary | ICD-10-CM

## 2018-12-29 DIAGNOSIS — R05 Cough: Secondary | ICD-10-CM

## 2018-12-30 NOTE — Progress Notes (Signed)
Office: [phone]  /  Fax: [phone]   HPI:   Chief Complaint: OBESITY Kim Barnes is here to discuss her progress with her obesity treatment plan. She is on the Category 2 plan and is following her eating plan approximately 0 % of the time. She states she is exercising 0 minutes 0 times per week. Kim Barnes states that she has been sick and has not been following her plan closely. She has done well overall. Her weight is (!) 360 lb (163.3 kg) today and she has maintained weight over a period of 2 weeks since her last visit. She has lost 18 lbs since starting treatment with [name].  Pre-Diabetes Kim Barnes has a diagnosis of prediabetes based on her elevated Hgb A1c and was informed this puts her at greater risk of developing diabetes. She is taking metformin currently and continues to work on diet and exercise to decrease risk of diabetes. She denies nausea or hypoglycemia.  At risk for diabetes Kim Barnes is at higher than average risk for developing diabetes due to her obesity and prediabetes. She currently denies polyuria or polydipsia.  Hypertension [name] is a 43 y.o. female with hypertension. She is taking Amlodipine, Bystolic and Maxzide 25 mg Kim Barnes denies chest pain or shortness of breath on exertion. She is working weight loss to help control her blood pressure with the goal of decreasing her risk of heart attack and stroke. [name] blood pressure is reasonably well controlled.  Cough Kim Barnes has a cough that is improving over time. She denies phlegm. She has a history of asthma (not using rescue inhaler).  Depression with emotional eating behaviors Kim Barnes  struggles with emotional eating and using food for comfort to the extent that it is negatively impacting her health. She often snacks when she is not hungry. Kim Barnes sometimes feels she is out of control and then feels guilty that she made poor food choices. She is currently taking Wellbutrin. She has been working on behavior  modification techniques to help reduce her emotional eating and has been somewhat successful. She shows no sign of suicidal or homicidal ideations.  Depression screen PHQ 2/9 02/26/2018  Decreased Interest 2  Down, Depressed, Hopeless 1  PHQ - 2 Score 3  Altered sleeping 2  Tired, decreased energy 2  Change in appetite 2  Feeling bad or failure about yourself  2  Trouble concentrating 1  Moving slowly or fidgety/restless 1  Suicidal thoughts 0  PHQ-9 Score 13  Difficult doing work/chores Somewhat difficult     ASSESSMENT AND PLAN:  Prediabetes  Other depression - with emotional eating  Essential hypertension  Cough  At risk for diabetes mellitus  Class 3 severe obesity with serious comorbidity and body mass index (BMI) of 50.0 to 59.9 in adult, unspecified obesity type (HCC)  PLAN:  Pre-Diabetes Kim Barnes will continue to work on weight loss, exercise, and decreasing simple carbohydrates in her diet to help decrease the risk of diabetes. We dicussed metformin including benefits and risks. She was informed that eating too many simple carbohydrates or too many calories at one sitting increases the likelihood of GI side effects. Kim Barnes will continue metformin for now and a prescription was not written today. Kim Barnes agreed to follow up with [name] as directed to monitor her progress.  Diabetes risk counseling Kim Barnes was given extended (15 minutes) diabetes prevention counseling today. She is 43 y.o. female and has risk factors for diabetes including obesity and prediabetes. We discussed intensive lifestyle modifications today with an  emphasis on weight loss as well as increasing exercise and decreasing simple carbohydrates in her diet.  Hypertension We discussed sodium restriction, working on healthy weight loss, and a regular exercise program as the means to achieve improved blood pressure control. Kim Barnes agreed with this plan and agreed to follow up as directed. We will continue to  monitor her blood pressure as well as her progress with the above lifestyle modifications. She will continue her medications as prescribed and will watch for signs of hypotension as she continues her lifestyle modifications.  Cough Kim Barnes will begin OTC Delsym and she will follow up with our clinic in 2 weeks.  Depression with Emotional Eating Behaviors We discussed behavior modification techniques today to help [name] deal with her emotional eating and depression. She will continue Wellbutrin SR 200 mg qd and follow up as directed.  Obesity Kim Barnes is currently in the action stage of change. As such, her goal is to continue with weight loss efforts She has agreed to follow the Category 2 plan and she will get back on her plan Kim Barnes has been instructed to work up to a goal of 150 minutes of combined cardio and strengthening exercise per week for weight loss and overall health benefits. We discussed the following Behavioral Modification Strategies today: increase H2O intake, keeping healthy foods in the home, increasing lean protein intake, decreasing simple carbohydrates , increasing vegetables and work on meal planning and easy cooking plans  Kim Barnes has agreed to follow up with our clinic in 2 weeks. She was informed of the importance of frequent follow up visits to maximize her success with intensive lifestyle modifications for her multiple health conditions.  ALLERGIES: No Known Allergies  MEDICATIONS: Current Outpatient Medications on File Prior to Visit  Medication Sig Dispense Refill  . ALBUTEROL IN Inhale into the lungs.    Kim Barnes amLODipine (NORVASC) 5 MG tablet Take 1 tablet (5 mg total) by mouth daily. 30 tablet 0  . benzonatate (TESSALON) 200 MG capsule Take 1 capsule (200 mg total) by mouth 2 (two) times daily as needed for cough. 30 capsule 1  . budesonide-formoterol (SYMBICORT) 160-4.5 MCG/ACT inhaler Inhale 2 puffs into the lungs 2 (two) times daily. 3 Inhaler 3  . buPROPion  (WELLBUTRIN SR) 200 MG 12 hr tablet Take 1 tablet (200 mg total) by mouth daily. 30 tablet 0  . ibuprofen (ADVIL,MOTRIN) 200 MG tablet Take 1,200 mg by mouth daily as needed for moderate pain (tooth pain).    . meloxicam (MOBIC) 15 MG tablet Take 1 tablet (15 mg total) by mouth daily as needed for pain. 30 tablet 1  . metFORMIN (GLUCOPHAGE) 500 MG tablet Take 1 tablet (500 mg total) by mouth daily with breakfast. 30 tablet 0  . methylPREDNISolone (MEDROL DOSEPAK) 4 MG TBPK tablet As directed 21 tablet 0  . Nebivolol HCl (BYSTOLIC) 20 MG TABS Take 1 tablet (20 mg total) by mouth daily. 30 tablet 11  . promethazine-codeine (PHENERGAN WITH CODEINE) 6.25-10 MG/5ML syrup Take 5 mLs by mouth every 4 (four) hours as needed. 300 mL 0  . triamterene-hydrochlorothiazide (MAXZIDE-25) 37.5-25 MG tablet Take 1 tablet by mouth daily. 30 tablet 11  . Vitamin D, Ergocalciferol, (DRISDOL) 1.25 MG (50000 UT) CAPS capsule TAKE ONE CAPSULE BY MOUTH EVERY 7 DAYS 4 capsule 0   No current facility-administered medications on file prior to visit.     PAST MEDICAL HISTORY: Past Medical History:  Diagnosis Date  . Anxiety   . Asthma    prn  inhaler  . Back pain   . Bell's palsy   . Carpal tunnel syndrome of left wrist 01/2013  . Chest pain   . Cold hands and feet   . Cough   . Depression   . Dry mouth   . Dry skin   . Fatigue   . GERD (gastroesophageal reflux disease)    occasional; no current med.  Kim Barnes. History of brain tumor 1996   no neurologic deficits  . HTN (hypertension)    under control with med., has been on med. x 5 yr.  . Joint pain   . Obesity   . Obstructive sleep apnea   . PCOS (polycystic ovarian syndrome)   . Polycystic ovarian syndrome    no current med.; irregular periods  . Shortness of breath   . Stress   . Swelling of extremity   . Wheezing     PAST SURGICAL HISTORY: Past Surgical History:  Procedure Laterality Date  . BRAIN SURGERY  1996   exc. tumor  . CARPAL TUNNEL  RELEASE Left 02/10/2013   Procedure: CARPAL TUNNEL RELEASE;  Surgeon: Nicki ReaperGary R Kuzma, MD;  Location: [facility];  Service: Orthopedics;  Laterality: Left;  ANESTHESIA: IV REGIONAL FAB  . HYSTEROSCOPY W/D&C  02/21/2006  . HYSTEROSCOPY W/D&C  12/17/2011   Procedure: DILATATION AND CURETTAGE /HYSTEROSCOPY;  Surgeon: Meriel Picaichard M Holland, MD;  Location: [facility];  Service: Gynecology;  Laterality: N/A;  . LEEP  01/07/2004  . TONSILLECTOMY  09/21/2002    SOCIAL HISTORY: Social History   Tobacco Use  . Smoking status: Never Smoker  . Smokeless tobacco: Never Used  Substance Use Topics  . Alcohol use: Yes    Comment: rare; socially  . Drug use: No    FAMILY HISTORY: Family History  Problem Relation Age of Onset  . Hypertension Other   . Lung cancer Father        Smoker  . Cancer Father        lung/ smoker  . Heart disease Father   . Alcoholism Father   . Breast cancer Maternal Grandmother   . Cancer Maternal Grandmother        breast  . Emphysema Paternal Grandmother   . Asthma Mother   . Hyperlipidemia Mother   . Hypertension Mother   . Sleep apnea Mother   . Asthma Maternal Aunt   . Breast cancer Maternal Aunt        brain   . Breast cancer Paternal Aunt        prostate    ROS: Review of Systems  Constitutional: Negative for weight loss.  Respiratory: Positive for cough. Negative for sputum production and shortness of breath (on exertion).   Cardiovascular: Negative for chest pain.  Gastrointestinal: Negative for nausea.  Genitourinary: Negative for frequency.  Endo/Heme/Allergies: Negative for polydipsia.       Negative for hypoglycemia  Psychiatric/Behavioral: Positive for depression. Negative for suicidal ideas.    PHYSICAL EXAM: Blood pressure 133/86, pulse 77, temperature (!) 97.5 F (36.4 C), temperature source Oral, height 5\' 6"  (1.676 m), weight (!) 360 lb (163.3 kg), SpO2 95 %. Body mass index is 58.11 kg/m. Physical Exam Vitals signs reviewed.    Constitutional:      Appearance: Normal appearance. She is well-developed. She is obese.  Cardiovascular:     Rate and Rhythm: Normal rate.  Pulmonary:     Effort: Pulmonary effort is normal. No respiratory distress.  Musculoskeletal: Normal range of motion.  Skin:  General: Skin is warm and dry.  Neurological:     Mental Status: She is alert and oriented to person, place, and time.  Psychiatric:        Mood and Affect: Mood normal.        Behavior: Behavior normal.        Thought Content: Thought content does not include homicidal or suicidal ideation.     RECENT LABS AND TESTS: BMET    Component Value Date/Time   NA 140 11/11/2018 0902   K 3.9 11/11/2018 0902   CL 100 11/11/2018 0902   CO2 25 11/11/2018 0902   GLUCOSE 81 11/11/2018 0902   GLUCOSE 103 (H) 12/31/2017 0926   GLUCOSE 83 10/08/2006 1418   BUN 12 11/11/2018 0902   CREATININE 0.87 11/11/2018 0902   CALCIUM 9.5 11/11/2018 0902   GFRNONAA 84 11/11/2018 0902   GFRAA 96 11/11/2018 0902   Lab Results  Component Value Date   HGBA1C 5.7 (H) 11/11/2018   HGBA1C 5.7 (H) 07/28/2018   HGBA1C 5.8 (H) 02/26/2018   HGBA1C 5.3 03/31/2015   HGBA1C 6.0 04/20/2011   Lab Results  Component Value Date   INSULIN 26.3 (H) 11/11/2018   INSULIN 14.8 07/28/2018   INSULIN 26.4 (H) 02/26/2018   CBC    Component Value Date/Time   WBC 5.4 02/26/2018 0936   WBC 5.7 12/31/2017 0926   RBC 4.74 02/26/2018 0936   RBC 4.82 12/31/2017 0926   HGB 13.6 02/26/2018 0936   HCT 42.2 02/26/2018 0936   PLT 281.0 12/31/2017 0926   MCV 89 02/26/2018 0936   MCH 28.7 02/26/2018 0936   MCH 29.3 02/04/2017 1851   MCHC 32.2 02/26/2018 0936   MCHC 33.8 12/31/2017 0926   RDW 14.7 02/26/2018 0936   LYMPHSABS 2.1 02/26/2018 0936   MONOABS 0.3 12/31/2017 0926   EOSABS 0.1 02/26/2018 0936   BASOSABS 0.0 02/26/2018 0936   Iron/TIBC/Ferritin/ %Sat No results found for: IRON, TIBC, FERRITIN, IRONPCTSAT Lipid Panel     Component Value  Date/Time   CHOL 215 (H) 11/11/2018 0902   TRIG 84 11/11/2018 0902   HDL 55 11/11/2018 0902   CHOLHDL 4 12/31/2017 0926   VLDL 17.2 12/31/2017 0926   LDLCALC 143 (H) 11/11/2018 0902   LDLDIRECT 149.4 05/24/2010 0904   Hepatic Function Panel     Component Value Date/Time   PROT 7.8 11/11/2018 0902   ALBUMIN 4.3 11/11/2018 0902   AST 18 11/11/2018 0902   ALT 16 11/11/2018 0902   ALKPHOS 65 11/11/2018 0902   BILITOT 0.4 11/11/2018 0902   BILIDIR 0.0 12/31/2017 0926      Component Value Date/Time   TSH 2.540 02/26/2018 0936   TSH 1.78 12/31/2017 0926   TSH 1.12 10/01/2013 1056     Ref. Range 11/11/2018 09:02  Vitamin D, 25-Hydroxy Latest Ref Range: 30.0 - 100.0 ng/mL 21.6 (L)     OBESITY BEHAVIORAL INTERVENTION VISIT  Today's visit was # 20   Starting weight: 378 lbs Starting date: 02/26/2018 Today's weight : 360 lbs  Today's date: 12/29/2018 Total lbs lost to date: 118   ASK: We discussed the diagnosis of obesity with Renn L Plaisted today and Trany agreed to give us permission to discuss obesity behavioral modification therapy today.  ASSESS: Jia has the diagnosis of obesity and her BMI today is 58.13 Bralee is in the action stage of change   ADVISE: Alynn was educated on the multiple health risks of obesity as well as the benefit of  weight loss to improve her health. She was advised of the need for long term treatment and the importance of lifestyle modifications to improve her current health and to decrease her risk of future health problems.  AGREE: Multiple dietary modification options and treatment options were discussed and  Marchella agreed to follow the recommendations documented in the above note.  ARRANGE: Alanya was educated on the importance of frequent visits to treat obesity as outlined per CMS and USPSTF guidelines and agreed to schedule her next follow up appointment today.  Cristi Loron, am acting as Energy manager for [employer]. Manson Passey,  DO  I have reviewed the above documentation for accuracy and completeness, and I agree with the above. -Corinna Capra, DO

## 2019-01-12 ENCOUNTER — Ambulatory Visit (INDEPENDENT_AMBULATORY_CARE_PROVIDER_SITE_OTHER): Payer: 59 | Admitting: Bariatrics

## 2019-01-12 ENCOUNTER — Encounter (INDEPENDENT_AMBULATORY_CARE_PROVIDER_SITE_OTHER): Payer: Self-pay | Admitting: Bariatrics

## 2019-01-12 VITALS — BP 146/88 | HR 67 | Temp 98.2°F | Ht 66.0 in | Wt 366.0 lb

## 2019-01-12 DIAGNOSIS — I10 Essential (primary) hypertension: Secondary | ICD-10-CM

## 2019-01-12 DIAGNOSIS — Z9189 Other specified personal risk factors, not elsewhere classified: Secondary | ICD-10-CM | POA: Diagnosis not present

## 2019-01-12 DIAGNOSIS — E559 Vitamin D deficiency, unspecified: Secondary | ICD-10-CM

## 2019-01-12 DIAGNOSIS — Z6841 Body Mass Index (BMI) 40.0 and over, adult: Secondary | ICD-10-CM

## 2019-01-12 MED ORDER — AMLODIPINE BESYLATE 5 MG PO TABS: 5.0000 mg | ORAL_TABLET | Freq: Every day | ORAL | 0 refills | Status: DC

## 2019-01-13 NOTE — Progress Notes (Signed)
Office: [phone]  /  Fax: [phone]   HPI:   Chief Complaint: OBESITY Kim Barnes is here to discuss her progress with her obesity treatment plan. She is on the Category 2 plan and is following her eating plan approximately 50 % of the time. She states she is exercising 0 minutes 0 times per week. Kim Barnes states that she has not been doing well and she does not know why. Kim Barnes is under more stress. Her weight is (!) 366 lb (166 kg) today and has had a weight gain of 6 pounds over a period of 2 weeks since her last visit. She has lost 12 lbs since starting treatment with us.  Vitamin D deficiency Shamila has a diagnosis of vitamin D deficiency. She is currently taking vit D and denies nausea, vomiting or muscle weakness.  Hypertension Kim Barnes is a 43 y.o. female with hypertension. Kim Barnes is currently on Amlodipine, Bystolic and Triamterene-HCTZ. She has not been taking her medications for the last few days. Kim Barnes denies chest pain or shortness of breath on exertion. She is working weight loss to help control her blood pressure with the goal of decreasing her risk of heart attack and stroke. [name] blood pressure is not well controlled.  At risk for cardiovascular disease Kim Barnes is at a higher than average risk for cardiovascular disease due to obesity and hypertension. She currently denies any chest pain.  ASSESSMENT AND PLAN:  Vitamin D deficiency  Essential hypertension - Plan: amLODipine (NORVASC) 5 MG tablet  At risk for heart disease  Class 3 severe obesity with serious comorbidity and body mass index (BMI) of 50.0 to 59.9 in adult, unspecified obesity type (HCC)  PLAN:  Vitamin D Deficiency Kim Barnes was informed that low vitamin D levels contributes to fatigue and are associated with obesity, breast, and colon cancer. She agrees to continue to take prescription Vit D @50 ,000 IU every week #4 with no refills and will follow up for routine testing of vitamin D,  at least 2-3 times per year. She was informed of the risk of over-replacement of vitamin D and agrees to not increase her dose unless she discusses this with us first. Kim Barnes agrees to follow up as directed.  Hypertension We discussed sodium restriction, working on healthy weight loss, and a regular exercise program as the means to achieve improved blood pressure control. Kim Barnes agreed with this plan and agreed to follow up as directed. We will continue to monitor her blood pressure as well as her progress with the above lifestyle modifications. She agrees to continue Amlodipine 5 mg once daily #30 with no refills and she will take her blood pressure medications consistently. Kim Barnes will watch for signs of hypotension as she continues her lifestyle modifications.  Cardiovascular risk counselling Kim Barnes was given extended (15 minutes) coronary artery disease prevention counseling today. She is 43 y.o. female and has risk factors for heart disease including obesity and hypertension. We discussed intensive lifestyle modifications today with an emphasis on specific weight loss instructions and strategies. Pt was also informed of the importance of increasing exercise and decreasing saturated fats to help prevent heart disease.  Obesity Kim Barnes is currently in the action stage of change. As such, her goal is to continue with weight loss efforts She has agreed to follow the Category 3 plan Kim Barnes has been instructed to work up to a goal of 150 minutes of combined cardio and strengthening exercise per week for weight loss and overall health benefits. We discussed  the following Behavioral Modification Strategies today: increase H2O intake, keeping healthy foods in the home, increasing lean protein intake, decreasing simple carbohydrates, increasing vegetables and work on meal planning (more preparation) Category 2 grocery list was provided to patient today.  Kim Barnes has agreed to follow up with our clinic in 2  weeks. She was informed of the importance of frequent follow up visits to maximize her success with intensive lifestyle modifications for her multiple health conditions.  ALLERGIES: No Known Allergies  MEDICATIONS: Current Outpatient Medications on File Prior to Visit  Medication Sig Dispense Refill  . ALBUTEROL IN Inhale into the lungs.    . benzonatate (TESSALON) 200 MG capsule Take 1 capsule (200 mg total) by mouth 2 (two) times daily as needed for cough. 30 capsule 1  . budesonide-formoterol (SYMBICORT) 160-4.5 MCG/ACT inhaler Inhale 2 puffs into the lungs 2 (two) times daily. 3 Inhaler 3  . buPROPion (WELLBUTRIN SR) 200 MG 12 hr tablet Take 1 tablet (200 mg total) by mouth daily. 30 tablet 0  . ibuprofen (ADVIL,MOTRIN) 200 MG tablet Take 1,200 mg by mouth daily as needed for moderate pain (tooth pain).    . meloxicam (MOBIC) 15 MG tablet Take 1 tablet (15 mg total) by mouth daily as needed for pain. 30 tablet 1  . metFORMIN (GLUCOPHAGE) 500 MG tablet Take 1 tablet (500 mg total) by mouth daily with breakfast. 30 tablet 0  . methylPREDNISolone (MEDROL DOSEPAK) 4 MG TBPK tablet As directed 21 tablet 0  . Nebivolol HCl (BYSTOLIC) 20 MG TABS Take 1 tablet (20 mg total) by mouth daily. 30 tablet 11  . promethazine-codeine (PHENERGAN WITH CODEINE) 6.25-10 MG/5ML syrup Take 5 mLs by mouth every 4 (four) hours as needed. 300 mL 0  . triamterene-hydrochlorothiazide (MAXZIDE-25) 37.5-25 MG tablet Take 1 tablet by mouth daily. 30 tablet 11  . Vitamin D, Ergocalciferol, (DRISDOL) 1.25 MG (50000 UT) CAPS capsule TAKE ONE CAPSULE BY MOUTH EVERY 7 DAYS 4 capsule 0   No current facility-administered medications on file prior to visit.     PAST MEDICAL HISTORY: Past Medical History:  Diagnosis Date  . Anxiety   . Asthma    prn inhaler  . Back pain   . Bell's palsy   . Carpal tunnel syndrome of left wrist 01/2013  . Chest pain   . Cold hands and feet   . Cough   . Depression   . Dry mouth   .  Dry skin   . Fatigue   . GERD (gastroesophageal reflux disease)    occasional; no current med.  [name]. History of brain tumor 1996   no neurologic deficits  . HTN (hypertension)    under control with med., has been on med. x 5 yr.  . Joint pain   . Obesity   . Obstructive sleep apnea   . PCOS (polycystic ovarian syndrome)   . Polycystic ovarian syndrome    no current med.; irregular periods  . Shortness of breath   . Stress   . Swelling of extremity   . Wheezing     PAST SURGICAL HISTORY: Past Surgical History:  Procedure Laterality Date  . BRAIN SURGERY  1996   exc. tumor  . CARPAL TUNNEL RELEASE Left 02/10/2013   Procedure: CARPAL TUNNEL RELEASE;  Surgeon: Nicki ReaperGary R Kuzma, MD;  Location: [facility];  Service: Orthopedics;  Laterality: Left;  ANESTHESIA: IV REGIONAL FAB  . HYSTEROSCOPY W/D&C  02/21/2006  . HYSTEROSCOPY W/D&C  12/17/2011   Procedure:  DILATATION AND CURETTAGE /HYSTEROSCOPY;  Surgeon: Meriel Pica, MD;  Location: [facility];  Service: Gynecology;  Laterality: N/A;  . LEEP  01/07/2004  . TONSILLECTOMY  09/21/2002    SOCIAL HISTORY: Social History   Tobacco Use  . Smoking status: Never Smoker  . Smokeless tobacco: Never Used  Substance Use Topics  . Alcohol use: Yes    Comment: rare; socially  . Drug use: No    FAMILY HISTORY: Family History  Problem Relation Age of Onset  . Hypertension Other   . Lung cancer Father        Smoker  . Cancer Father        lung/ smoker  . Heart disease Father   . Alcoholism Father   . Breast cancer Maternal Grandmother   . Cancer Maternal Grandmother        breast  . Emphysema Paternal Grandmother   . Asthma Mother   . Hyperlipidemia Mother   . Hypertension Mother   . Sleep apnea Mother   . Asthma Maternal Aunt   . Breast cancer Maternal Aunt        brain   . Breast cancer Paternal Aunt        prostate    ROS: Review of Systems  Constitutional: Negative for weight loss.  Respiratory: Negative for  shortness of breath (on exertion).   Cardiovascular: Negative for chest pain.  Gastrointestinal: Negative for nausea and vomiting.  Musculoskeletal:       Negative for muscle weakness  Psychiatric/Behavioral:       Positive for Stress    PHYSICAL EXAM: Blood pressure (!) 146/88, pulse 67, temperature 98.2 F (36.8 C), temperature source Oral, height 5\' 6"  (1.676 m), weight (!) 366 lb (166 kg), SpO2 98 %. Body mass index is 59.07 kg/m. Physical Exam Vitals signs reviewed.  Constitutional:      Appearance: Normal appearance. She is well-developed. She is obese.  Cardiovascular:     Rate and Rhythm: Normal rate.  Pulmonary:     Effort: Pulmonary effort is normal.  Musculoskeletal: Normal range of motion.  Skin:    General: Skin is warm and dry.  Neurological:     Mental Status: She is alert and oriented to person, place, and time.  Psychiatric:        Mood and Affect: Mood normal.        Behavior: Behavior normal.     RECENT LABS AND TESTS: BMET    Component Value Date/Time   NA 140 11/11/2018 0902   K 3.9 11/11/2018 0902   CL 100 11/11/2018 0902   CO2 25 11/11/2018 0902   GLUCOSE 81 11/11/2018 0902   GLUCOSE 103 (H) 12/31/2017 0926   GLUCOSE 83 10/08/2006 1418   BUN 12 11/11/2018 0902   CREATININE 0.87 11/11/2018 0902   CALCIUM 9.5 11/11/2018 0902   GFRNONAA 84 11/11/2018 0902   GFRAA 96 11/11/2018 0902   Lab Results  Component Value Date   HGBA1C 5.7 (H) 11/11/2018   HGBA1C 5.7 (H) 07/28/2018   HGBA1C 5.8 (H) 02/26/2018   HGBA1C 5.3 03/31/2015   HGBA1C 6.0 04/20/2011   Lab Results  Component Value Date   INSULIN 26.3 (H) 11/11/2018   INSULIN 14.8 07/28/2018   INSULIN 26.4 (H) 02/26/2018   CBC    Component Value Date/Time   WBC 5.4 02/26/2018 0936   WBC 5.7 12/31/2017 0926   RBC 4.74 02/26/2018 0936   RBC 4.82 12/31/2017 0926   HGB 13.6 02/26/2018 0936  HCT 42.2 02/26/2018 0936   PLT 281.0 12/31/2017 0926   MCV 89 02/26/2018 0936   MCH 28.7  02/26/2018 0936   MCH 29.3 02/04/2017 1851   MCHC 32.2 02/26/2018 0936   MCHC 33.8 12/31/2017 0926   RDW 14.7 02/26/2018 0936   LYMPHSABS 2.1 02/26/2018 0936   MONOABS 0.3 12/31/2017 0926   EOSABS 0.1 02/26/2018 0936   BASOSABS 0.0 02/26/2018 0936   Iron/TIBC/Ferritin/ %Sat No results found for: IRON, TIBC, FERRITIN, IRONPCTSAT Lipid Panel     Component Value Date/Time   CHOL 215 (H) 11/11/2018 0902   TRIG 84 11/11/2018 0902   HDL 55 11/11/2018 0902   CHOLHDL 4 12/31/2017 0926   VLDL 17.2 12/31/2017 0926   LDLCALC 143 (H) 11/11/2018 0902   LDLDIRECT 149.4 05/24/2010 0904   Hepatic Function Panel     Component Value Date/Time   PROT 7.8 11/11/2018 0902   ALBUMIN 4.3 11/11/2018 0902   AST 18 11/11/2018 0902   ALT 16 11/11/2018 0902   ALKPHOS 65 11/11/2018 0902   BILITOT 0.4 11/11/2018 0902   BILIDIR 0.0 12/31/2017 0926      Component Value Date/Time   TSH 2.540 02/26/2018 0936   TSH 1.78 12/31/2017 0926   TSH 1.12 10/01/2013 1056     Ref. Range 11/11/2018 09:02  Vitamin D, 25-Hydroxy Latest Ref Range: 30.0 - 100.0 ng/mL 21.6 (L)     OBESITY BEHAVIORAL INTERVENTION VISIT  Today's visit was # 21   Starting weight: 378 lbs Starting date: 02/26/2018 Today's weight : 366 lbs Today's date: 01/12/2019 Total lbs lost to date: 12   ASK: We discussed the diagnosis of obesity with Kim Barnes today and Kim Barnes agreed to give [name] permission to discuss obesity behavioral modification therapy today.  ASSESS: Kim Barnes has the diagnosis of obesity and her BMI today is 59.1 Kim Barnes is in the action stage of change   ADVISE: Kim Barnes was educated on the multiple health risks of obesity as well as the benefit of weight loss to improve her health. She was advised of the need for long term treatment and the importance of lifestyle modifications to improve her current health and to decrease her risk of future health problems.  AGREE: Multiple dietary modification options and  treatment options were discussed and  Kim Barnes agreed to follow the recommendations documented in the above note.  ARRANGE: Kim Barnes was educated on the importance of frequent visits to treat obesity as outlined per CMS and USPSTF guidelines and agreed to schedule her next follow up appointment today.  Cristi Loron, am acting as Energy manager for [employer]. Manson Passey, DO  I have reviewed the above documentation for accuracy and completeness, and I agree with the above. -Corinna Capra, DO

## 2019-01-27 ENCOUNTER — Ambulatory Visit (INDEPENDENT_AMBULATORY_CARE_PROVIDER_SITE_OTHER): Payer: 59 | Admitting: Family Medicine

## 2019-01-27 ENCOUNTER — Encounter (INDEPENDENT_AMBULATORY_CARE_PROVIDER_SITE_OTHER): Payer: Self-pay | Admitting: Family Medicine

## 2019-01-27 VITALS — BP 149/84 | HR 66 | Temp 97.9°F | Ht 66.0 in | Wt 365.0 lb

## 2019-01-27 DIAGNOSIS — I10 Essential (primary) hypertension: Secondary | ICD-10-CM | POA: Diagnosis not present

## 2019-01-27 DIAGNOSIS — R7303 Prediabetes: Secondary | ICD-10-CM | POA: Diagnosis not present

## 2019-01-27 DIAGNOSIS — E66813 Obesity, class 3: Secondary | ICD-10-CM

## 2019-01-27 DIAGNOSIS — Z6841 Body Mass Index (BMI) 40.0 and over, adult: Secondary | ICD-10-CM | POA: Diagnosis not present

## 2019-01-27 NOTE — Progress Notes (Signed)
Office: [phone]  /  Fax: [phone]   HPI:   Chief Complaint: OBESITY [name] is here to discuss her progress with her obesity treatment plan. She is on the Category 2 plan and is following her eating plan approximately 50% of the time. She states she is exercising 0 minutes 0 times per week. Kim Barnes stuck to the plan better when she first started and wants to get back on track. Journaling did not work well for her in the past. She reports she has not exercised in a while.  Her weight is (!) 365 lb (165.6 kg) today and has had a weight loss of 1 pound over a period of 2 weeks since her last visit. She has lost 13 lbs since starting treatment with [name].  Pre-Diabetes Kim Barnes has a diagnosis of prediabetes based on her last elevated Hgb A1c of 5.7 on 11/11/2018 and was informed this puts her at greater risk of developing diabetes. She is taking metformin currently and continues to work on diet and exercise to decrease risk of diabetes. She denies polyphagia.  Hypertension Kim Barnes is a 43 y.o. female with hypertension.  Kim Barnes denies chest pain or shortness of breath. She is working weight loss to help control her blood pressure with the goal of decreasing her risk of heart attack and stroke. Kim Barnes's blood pressure is increased today and reports she is not always compliant with her medications.  ASSESSMENT AND PLAN:  Prediabetes  Essential hypertension  Class 3 severe obesity with serious comorbidity and body mass index (BMI) of 50.0 to 59.9 in adult, unspecified obesity type (HCC)  PLAN:  Pre-Diabetes Kim Barnes will continue to work on weight loss, exercise, and decreasing simple carbohydrates in her diet to help decrease the risk of diabetes. Kim Barnes will continue on metformin for now and a prescription was not written today. Kim Barnes agreed to follow-up with [name] as directed to monitor her progress.  Hypertension We discussed end organ damage from uncontrolled  hypertension. Pier agreed to be compliant with her medications and will watch for signs of hypotension as she continues her lifestyle modifications.   Obesity Kim Barnes is currently in the action stage of change. As such, her goal is to continue with weight loss efforts. She has agreed to keep a food journal with 1100-1200 calories and 80-85 grams of protein daily. Kim Barnes has been instructed to go to the gym twice weekly and do a combination of cardio and resistance.  We discussed the following Behavioral Modification Strategies today: increasing lean protein intake, no skipping meals, planning for success, and keeping a strict food journal. Kim Barnes will go to the gym 2 times per week and do cardio/resistance.  Kim Barnes has agreed to follow up with our clinic in 2-3 weeks. She was informed of the importance of frequent follow up visits to maximize her success with intensive lifestyle modifications for her multiple health conditions.  ALLERGIES: No Known Allergies  MEDICATIONS: Current Outpatient Medications on File Prior to Visit  Medication Sig Dispense Refill  . ALBUTEROL IN Inhale into the lungs.    [name] amLODipine (NORVASC) 5 MG tablet Take 1 tablet (5 mg total) by mouth daily. 30 tablet 0  . benzonatate (TESSALON) 200 MG capsule Take 1 capsule (200 mg total) by mouth 2 (two) times daily as needed for cough. 30 capsule 1  . budesonide-formoterol (SYMBICORT) 160-4.5 MCG/ACT inhaler Inhale 2 puffs into the lungs 2 (two) times daily. 3 Inhaler 3  . buPROPion (WELLBUTRIN SR) 200 MG 12  hr tablet Take 1 tablet (200 mg total) by mouth daily. 30 tablet 0  . ibuprofen (ADVIL,MOTRIN) 200 MG tablet Take 1,200 mg by mouth daily as needed for moderate pain (tooth pain).    . meloxicam (MOBIC) 15 MG tablet Take 1 tablet (15 mg total) by mouth daily as needed for pain. 30 tablet 1  . metFORMIN (GLUCOPHAGE) 500 MG tablet Take 1 tablet (500 mg total) by mouth daily with breakfast. 30 tablet 0  .  methylPREDNISolone (MEDROL DOSEPAK) 4 MG TBPK tablet As directed 21 tablet 0  . Nebivolol HCl (BYSTOLIC) 20 MG TABS Take 1 tablet (20 mg total) by mouth daily. 30 tablet 11  . promethazine-codeine (PHENERGAN WITH CODEINE) 6.25-10 MG/5ML syrup Take 5 mLs by mouth every 4 (four) hours as needed. 300 mL 0  . triamterene-hydrochlorothiazide (MAXZIDE-25) 37.5-25 MG tablet Take 1 tablet by mouth daily. 30 tablet 11  . Vitamin D, Ergocalciferol, (DRISDOL) 1.25 MG (50000 UT) CAPS capsule TAKE ONE CAPSULE BY MOUTH EVERY 7 DAYS 4 capsule 0   No current facility-administered medications on file prior to visit.     PAST MEDICAL HISTORY: Past Medical History:  Diagnosis Date  . Anxiety   . Asthma    prn inhaler  . Back pain   . Bell's palsy   . Carpal tunnel syndrome of left wrist 01/2013  . Chest pain   . Cold hands and feet   . Cough   . Depression   . Dry mouth   . Dry skin   . Fatigue   . GERD (gastroesophageal reflux disease)    occasional; no current med.  [name] History of brain tumor 1996   no neurologic deficits  . HTN (hypertension)    under control with med., has been on med. x 5 yr.  . Joint pain   . Obesity   . Obstructive sleep apnea   . PCOS (polycystic ovarian syndrome)   . Polycystic ovarian syndrome    no current med.; irregular periods  . Shortness of breath   . Stress   . Swelling of extremity   . Wheezing     PAST SURGICAL HISTORY: Past Surgical History:  Procedure Laterality Date  . BRAIN SURGERY  1996   exc. tumor  . CARPAL TUNNEL RELEASE Left 02/10/2013   Procedure: CARPAL TUNNEL RELEASE;  Surgeon: Nicki Reaper, MD;  Location: [facility];  Service: Orthopedics;  Laterality: Left;  ANESTHESIA: IV REGIONAL FAB  . HYSTEROSCOPY W/D&C  02/21/2006  . HYSTEROSCOPY W/D&C  12/17/2011   Procedure: DILATATION AND CURETTAGE /HYSTEROSCOPY;  Surgeon: Meriel Pica, MD;  Location: [facility];  Service: Gynecology;  Laterality: N/A;  . LEEP  01/07/2004  .  TONSILLECTOMY  09/21/2002    SOCIAL HISTORY: Social History   Tobacco Use  . Smoking status: Never Smoker  . Smokeless tobacco: Never Used  Substance Use Topics  . Alcohol use: Yes    Comment: rare; socially  . Drug use: No    FAMILY HISTORY: Family History  Problem Relation Age of Onset  . Hypertension Other   . Lung cancer Father        Smoker  . Cancer Father        lung/ smoker  . Heart disease Father   . Alcoholism Father   . Breast cancer Maternal Grandmother   . Cancer Maternal Grandmother        breast  . Emphysema Paternal Grandmother   . Asthma Mother   .  Hyperlipidemia Mother   . Hypertension Mother   . Sleep apnea Mother   . Asthma Maternal Aunt   . Breast cancer Maternal Aunt        brain   . Breast cancer Paternal Aunt        prostate   ROS: Review of Systems  Constitutional: Positive for weight loss.  Respiratory: Negative for shortness of breath.   Cardiovascular: Negative for chest pain.  Endo/Heme/Allergies:       Negative for polyphagia. Negative for hypoglycemia.   PHYSICAL EXAM: Blood pressure (!) 149/84, pulse 66, temperature 97.9 F (36.6 C), temperature source Oral, height 5\' 6"  (1.676 m), weight (!) 365 lb (165.6 kg), SpO2 96 %. Body mass index is 58.91 kg/m. Physical Exam Vitals signs reviewed.  Constitutional:      Appearance: Normal appearance. She is obese.  Cardiovascular:     Rate and Rhythm: Normal rate.     Pulses: Normal pulses.  Pulmonary:     Effort: Pulmonary effort is normal.     Breath sounds: Normal breath sounds.  Musculoskeletal: Normal range of motion.  Skin:    General: Skin is warm and dry.  Neurological:     Mental Status: She is alert and oriented to person, place, and time.  Psychiatric:        Behavior: Behavior normal.   RECENT LABS AND TESTS: BMET    Component Value Date/Time   NA 140 11/11/2018 0902   K 3.9 11/11/2018 0902   CL 100 11/11/2018 0902   CO2 25 11/11/2018 0902   GLUCOSE 81  11/11/2018 0902   GLUCOSE 103 (H) 12/31/2017 0926   GLUCOSE 83 10/08/2006 1418   BUN 12 11/11/2018 0902   CREATININE 0.87 11/11/2018 0902   CALCIUM 9.5 11/11/2018 0902   GFRNONAA 84 11/11/2018 0902   GFRAA 96 11/11/2018 0902   Lab Results  Component Value Date   HGBA1C 5.7 (H) 11/11/2018   HGBA1C 5.7 (H) 07/28/2018   HGBA1C 5.8 (H) 02/26/2018   HGBA1C 5.3 03/31/2015   HGBA1C 6.0 04/20/2011   Lab Results  Component Value Date   INSULIN 26.3 (H) 11/11/2018   INSULIN 14.8 07/28/2018   INSULIN 26.4 (H) 02/26/2018   CBC    Component Value Date/Time   WBC 5.4 02/26/2018 0936   WBC 5.7 12/31/2017 0926   RBC 4.74 02/26/2018 0936   RBC 4.82 12/31/2017 0926   HGB 13.6 02/26/2018 0936   HCT 42.2 02/26/2018 0936   PLT 281.0 12/31/2017 0926   MCV 89 02/26/2018 0936   MCH 28.7 02/26/2018 0936   MCH 29.3 02/04/2017 1851   MCHC 32.2 02/26/2018 0936   MCHC 33.8 12/31/2017 0926   RDW 14.7 02/26/2018 0936   LYMPHSABS 2.1 02/26/2018 0936   MONOABS 0.3 12/31/2017 0926   EOSABS 0.1 02/26/2018 0936   BASOSABS 0.0 02/26/2018 0936   Iron/TIBC/Ferritin/ %Sat No results found for: IRON, TIBC, FERRITIN, IRONPCTSAT Lipid Panel     Component Value Date/Time   CHOL 215 (H) 11/11/2018 0902   TRIG 84 11/11/2018 0902   HDL 55 11/11/2018 0902   CHOLHDL 4 12/31/2017 0926   VLDL 17.2 12/31/2017 0926   LDLCALC 143 (H) 11/11/2018 0902   LDLDIRECT 149.4 05/24/2010 0904   Hepatic Function Panel     Component Value Date/Time   PROT 7.8 11/11/2018 0902   ALBUMIN 4.3 11/11/2018 0902   AST 18 11/11/2018 0902   ALT 16 11/11/2018 0902   ALKPHOS 65 11/11/2018 0902   BILITOT 0.4  11/11/2018 0902   BILIDIR 0.0 12/31/2017 0926      Component Value Date/Time   TSH 2.540 02/26/2018 0936   TSH 1.78 12/31/2017 0926   TSH 1.12 10/01/2013 1056    Ref. Range 11/11/2018 09:02  Vitamin D, 25-Hydroxy Latest Ref Range: 30.0 - 100.0 ng/mL 21.6 (L)   OBESITY BEHAVIORAL INTERVENTION VISIT  Today's visit  was #22  Starting weight: 378 lbs Starting date: 02/26/2018 Today's weight: 365 lbs  Today's date: 01/27/2019 Total lbs lost to date: 13  ASK: We discussed the diagnosis of obesity with Kim Barnes today and Kim Barnes agreed to give [name] permission to discuss obesity behavioral modification therapy today.  ASSESS: Ryah has the diagnosis of obesity and her BMI today is 58.91. Jennavecia is in the action stage of change.   ADVISE: Tiani was educated on the multiple health risks of obesity as well as the benefit of weight loss to improve her health. She was advised of the need for long term treatment and the importance of lifestyle modifications to improve her current health and to decrease her risk of future health problems.  AGREE: Multiple dietary modification options and treatment options were discussed and  Randye agreed to follow the recommendations documented in the above note.  ARRANGE: Lester was educated on the importance of frequent visits to treat obesity as outlined per CMS and USPSTF guidelines and agreed to schedule her next follow up appointment today.  IMarianna Payment, am acting as Energy manager for [employer], FNP-C.  I have reviewed the above documentation for accuracy and completeness, and I agree with the above.  -  , FNP-C.

## 2019-01-29 ENCOUNTER — Encounter (INDEPENDENT_AMBULATORY_CARE_PROVIDER_SITE_OTHER): Payer: Self-pay | Admitting: Family Medicine

## 2019-02-10 ENCOUNTER — Ambulatory Visit (INDEPENDENT_AMBULATORY_CARE_PROVIDER_SITE_OTHER): Payer: 59 | Admitting: Family Medicine

## 2019-02-10 VITALS — BP 130/72 | HR 70 | Temp 97.9°F | Ht 66.0 in | Wt 372.0 lb

## 2019-02-10 DIAGNOSIS — Z9189 Other specified personal risk factors, not elsewhere classified: Secondary | ICD-10-CM | POA: Diagnosis not present

## 2019-02-10 DIAGNOSIS — R7303 Prediabetes: Secondary | ICD-10-CM | POA: Diagnosis not present

## 2019-02-10 DIAGNOSIS — E559 Vitamin D deficiency, unspecified: Secondary | ICD-10-CM | POA: Diagnosis not present

## 2019-02-10 DIAGNOSIS — Z6841 Body Mass Index (BMI) 40.0 and over, adult: Secondary | ICD-10-CM

## 2019-02-10 MED ORDER — VITAMIN D (ERGOCALCIFEROL) 1.25 MG (50000 UNIT) PO CAPS: ORAL_CAPSULE | ORAL | 0 refills | Status: DC

## 2019-02-10 NOTE — Progress Notes (Signed)
Office: [phone]  /  Fax: [phone]   HPI:   Chief Complaint: OBESITY Kim Barnes is here to discuss her progress with her obesity treatment plan. She is on the keep a food journal with 1100-1200 calories and 80-85 grams of protein daily and is following her eating plan approximately 15 % of the time. She states she is exercising 0 minutes 0 times per week. Kim Barnes felt she was "all over the place" over the past few weeks with her meal plan. She has been skipping meals, usually lunch.  Her weight is (!) 372 lb (168.7 kg) today and has gained 7 pounds since her last visit. She has lost 6 lbs since starting treatment with [name].  Vitamin D Deficiency Kim Barnes has a diagnosis of vitamin D deficiency. She is currently taking prescription Vit D, but level is not at goal. Last Vit D level was 21.6 on 11/11/18. She denies nausea, vomiting or muscle weakness.  Pre-Diabetes Kim Barnes has a diagnosis of pre-diabetes based on her elevated Hgb A1c at 5.7 and was informed this puts her at greater risk of developing diabetes. She is taking metformin currently and continues to work on diet and exercise to decrease risk of diabetes. She denies polyphagia or hypoglycemia.  At risk for diabetes Kim Barnes is at higher than average risk for developing diabetes due to her obesity and pre-diabetes. She currently denies polyuria or polydipsia.  ASSESSMENT AND PLAN:  Vitamin D deficiency - Plan: Vitamin D, Ergocalciferol, (DRISDOL) 1.25 MG (50000 UT) CAPS capsule  Prediabetes  At risk for diabetes mellitus  Class 3 severe obesity with serious comorbidity and body mass index (BMI) of 60.0 to 69.9 in adult, unspecified obesity type (HCC)  PLAN:  Vitamin D Deficiency [name] was informed that low vitamin D levels contributes to fatigue and are associated with obesity, breast, and colon cancer. [name] agrees to continue taking prescription Vit D ,000 IU every week #4 and we will refill for 1 month. She will follow up  for routine testing of vitamin D, at least 2-3 times per year. She was informed of the risk of over-replacement of vitamin D and agrees to not increase her dose unless she discusses this with [name] first. Kallista agrees to follow up with our clinic in 3 weeks.  Pre-Diabetes Kim Barnes will continue to work on weight loss, exercise, and decreasing simple carbohydrates in her diet to help decrease the risk of diabetes.  Kim Barnes agrees to continue taking metformin, and she agrees to follow up with our clinic in 3 weeks as directed to monitor her progress.  Diabetes risk counseling Kim Barnes was given extended (15 minutes) diabetes prevention counseling today. She is 43 y.o. female and has risk factors for diabetes including obesity and pre-diabetes. We discussed intensive lifestyle modifications today with an emphasis on weight loss as well as increasing exercise and decreasing simple carbohydrates in her diet.  Obesity Kim Barnes is currently in the action stage of change. As such, her goal is to continue with weight loss efforts She has agreed to keep a food journal with 1100-1200 calories and 80-85 grams of protein daily or follow the Category 2 plan Kim Barnes has not been prescribed exercise at this time. We discussed the following Behavioral Modification Strategies today: increasing lean protein intake, work on meal planning and easy cooking plans, planning for success, and keep a strict food journal   Kim Barnes has agreed to follow up with our clinic in 3 weeks. She was informed of the importance of frequent follow up visits  to maximize her success with intensive lifestyle modifications for her multiple health conditions.  ALLERGIES: No Known Allergies  MEDICATIONS: Current Outpatient Medications on File Prior to Visit  Medication Sig Dispense Refill  . ALBUTEROL IN Inhale into the lungs.    Kim Barnes. amLODipine (NORVASC) 5 MG tablet Take 1 tablet (5 mg total) by mouth daily. 30 tablet 0  . benzonatate (TESSALON) 200  MG capsule Take 1 capsule (200 mg total) by mouth 2 (two) times daily as needed for cough. 30 capsule 1  . budesonide-formoterol (SYMBICORT) 160-4.5 MCG/ACT inhaler Inhale 2 puffs into the lungs 2 (two) times daily. 3 Inhaler 3  . buPROPion (WELLBUTRIN SR) 200 MG 12 hr tablet Take 1 tablet (200 mg total) by mouth daily. 30 tablet 0  . ibuprofen (ADVIL,MOTRIN) 200 MG tablet Take 1,200 mg by mouth daily as needed for moderate pain (tooth pain).    . meloxicam (MOBIC) 15 MG tablet Take 1 tablet (15 mg total) by mouth daily as needed for pain. 30 tablet 1  . metFORMIN (GLUCOPHAGE) 500 MG tablet Take 1 tablet (500 mg total) by mouth daily with breakfast. 30 tablet 0  . methylPREDNISolone (MEDROL DOSEPAK) 4 MG TBPK tablet As directed 21 tablet 0  . Nebivolol HCl (BYSTOLIC) 20 MG TABS Take 1 tablet (20 mg total) by mouth daily. 30 tablet 11  . promethazine-codeine (PHENERGAN WITH CODEINE) 6.25-10 MG/5ML syrup Take 5 mLs by mouth every 4 (four) hours as needed. 300 mL 0  . triamterene-hydrochlorothiazide (MAXZIDE-25) 37.5-25 MG tablet Take 1 tablet by mouth daily. 30 tablet 11   No current facility-administered medications on file prior to visit.     PAST MEDICAL HISTORY: Past Medical History:  Diagnosis Date  . Anxiety   . Asthma    prn inhaler  . Back pain   . Bell's palsy   . Carpal tunnel syndrome of left wrist 01/2013  . Chest pain   . Cold hands and feet   . Cough   . Depression   . Dry mouth   . Dry skin   . Fatigue   . GERD (gastroesophageal reflux disease)    occasional; no current med.  Kim Barnes. History of brain tumor 1996   no neurologic deficits  . HTN (hypertension)    under control with med., has been on med. x 5 yr.  . Joint pain   . Obesity   . Obstructive sleep apnea   . PCOS (polycystic ovarian syndrome)   . Polycystic ovarian syndrome    no current med.; irregular periods  . Shortness of breath   . Stress   . Swelling of extremity   . Wheezing     PAST SURGICAL  HISTORY: Past Surgical History:  Procedure Laterality Date  . BRAIN SURGERY  1996   exc. tumor  . CARPAL TUNNEL RELEASE Left 02/10/2013   Procedure: CARPAL TUNNEL RELEASE;  Surgeon: Nicki ReaperGary R Kuzma, MD;  Location: [facility];  Service: Orthopedics;  Laterality: Left;  ANESTHESIA: IV REGIONAL FAB  . HYSTEROSCOPY W/D&C  02/21/2006  . HYSTEROSCOPY W/D&C  12/17/2011   Procedure: DILATATION AND CURETTAGE /HYSTEROSCOPY;  Surgeon: Meriel Picaichard M Holland, MD;  Location: [facility];  Service: Gynecology;  Laterality: N/A;  . LEEP  01/07/2004  . TONSILLECTOMY  09/21/2002    SOCIAL HISTORY: Social History   Tobacco Use  . Smoking status: Never Smoker  . Smokeless tobacco: Never Used  Substance Use Topics  . Alcohol use: Yes    Comment: rare; socially  .  Drug use: No    FAMILY HISTORY: Family History  Problem Relation Age of Onset  . Hypertension Other   . Lung cancer Father        Smoker  . Cancer Father        lung/ smoker  . Heart disease Father   . Alcoholism Father   . Breast cancer Maternal Grandmother   . Cancer Maternal Grandmother        breast  . Emphysema Paternal Grandmother   . Asthma Mother   . Hyperlipidemia Mother   . Hypertension Mother   . Sleep apnea Mother   . Asthma Maternal Aunt   . Breast cancer Maternal Aunt        brain   . Breast cancer Paternal Aunt        prostate    ROS: Review of Systems  Constitutional: Negative for weight loss.  Gastrointestinal: Negative for nausea and vomiting.  Genitourinary: Negative for frequency.  Musculoskeletal:       Negative muscle weakness  Endo/Heme/Allergies: Negative for polydipsia.       Negative polyphagia Negative hypoglycemia    PHYSICAL EXAM: Blood pressure 130/72, pulse 70, temperature 97.9 F (36.6 C), temperature source Oral, height 5\' 6"  (1.676 m), weight (!) 372 lb (168.7 kg), last menstrual period 01/13/2019, SpO2 98 %. Body mass index is 60.04 kg/m. Physical Exam Vitals signs reviewed.    Constitutional:      Appearance: Normal appearance. She is obese.  Cardiovascular:     Rate and Rhythm: Normal rate.     Pulses: Normal pulses.  Pulmonary:     Effort: Pulmonary effort is normal.     Breath sounds: Normal breath sounds.  Musculoskeletal: Normal range of motion.  Skin:    General: Skin is warm and dry.  Neurological:     Mental Status: She is alert and oriented to person, place, and time.  Psychiatric:        Mood and Affect: Mood normal.        Behavior: Behavior normal.     RECENT LABS AND TESTS: BMET    Component Value Date/Time   NA 140 11/11/2018 0902   K 3.9 11/11/2018 0902   CL 100 11/11/2018 0902   CO2 25 11/11/2018 0902   GLUCOSE 81 11/11/2018 0902   GLUCOSE 103 (H) 12/31/2017 0926   GLUCOSE 83 10/08/2006 1418   BUN 12 11/11/2018 0902   CREATININE 0.87 11/11/2018 0902   CALCIUM 9.5 11/11/2018 0902   GFRNONAA 84 11/11/2018 0902   GFRAA 96 11/11/2018 0902   Lab Results  Component Value Date   HGBA1C 5.7 (H) 11/11/2018   HGBA1C 5.7 (H) 07/28/2018   HGBA1C 5.8 (H) 02/26/2018   HGBA1C 5.3 03/31/2015   HGBA1C 6.0 04/20/2011   Lab Results  Component Value Date   INSULIN 26.3 (H) 11/11/2018   INSULIN 14.8 07/28/2018   INSULIN 26.4 (H) 02/26/2018   CBC    Component Value Date/Time   WBC 5.4 02/26/2018 0936   WBC 5.7 12/31/2017 0926   RBC 4.74 02/26/2018 0936   RBC 4.82 12/31/2017 0926   HGB 13.6 02/26/2018 0936   HCT 42.2 02/26/2018 0936   PLT 281.0 12/31/2017 0926   MCV 89 02/26/2018 0936   MCH 28.7 02/26/2018 0936   MCH 29.3 02/04/2017 1851   MCHC 32.2 02/26/2018 0936   MCHC 33.8 12/31/2017 0926   RDW 14.7 02/26/2018 0936   LYMPHSABS 2.1 02/26/2018 0936   MONOABS 0.3 12/31/2017 0926  EOSABS 0.1 02/26/2018 0936   BASOSABS 0.0 02/26/2018 0936   Iron/TIBC/Ferritin/ %Sat No results found for: IRON, TIBC, FERRITIN, IRONPCTSAT Lipid Panel     Component Value Date/Time   CHOL 215 (H) 11/11/2018 0902   TRIG 84 11/11/2018 0902    HDL 55 11/11/2018 0902   CHOLHDL 4 12/31/2017 0926   VLDL 17.2 12/31/2017 0926   LDLCALC 143 (H) 11/11/2018 0902   LDLDIRECT 149.4 05/24/2010 0904   Hepatic Function Panel     Component Value Date/Time   PROT 7.8 11/11/2018 0902   ALBUMIN 4.3 11/11/2018 0902   AST 18 11/11/2018 0902   ALT 16 11/11/2018 0902   ALKPHOS 65 11/11/2018 0902   BILITOT 0.4 11/11/2018 0902   BILIDIR 0.0 12/31/2017 0926      Component Value Date/Time   TSH 2.540 02/26/2018 0936   TSH 1.78 12/31/2017 0926   TSH 1.12 10/01/2013 1056      OBESITY BEHAVIORAL INTERVENTION VISIT  Today's visit was # 23   Starting weight: 378 lbs Starting date: 02/26/18 Today's weight : 372 lbs  Today's date: 02/10/2019 Total lbs lost to date: 6    02/10/2019  Height 5\' 6"  (1.676 m)  Weight 372 lb (168.7 kg) (A)  BMI (Calculated) 60.07  BLOOD PRESSURE - SYSTOLIC 130  BLOOD PRESSURE - DIASTOLIC 72   Body Fat % 58.2 %     ASK: We discussed the diagnosis of obesity with Zhara L Som today and Jaklyn agreed to give [name] permission to discuss obesity behavioral modification therapy today.  ASSESS: Maili has the diagnosis of obesity and her BMI today is 60.07 Gibson is in the action stage of change   ADVISE: Jisele was educated on the multiple health risks of obesity as well as the benefit of weight loss to improve her health. She was advised of the need for long term treatment and the importance of lifestyle modifications to improve her current health and to decrease her risk of future health problems.  AGREE: Multiple dietary modification options and treatment options were discussed and  Lyndel agreed to follow the recommendations documented in the above note.  ARRANGE: Valincia was educated on the importance of frequent visits to treat obesity as outlined per CMS and USPSTF guidelines and agreed to schedule her next follow up appointment today.  Trude Mcburney, am acting as Energy manager for [employer], FNP-C.  I have reviewed the above documentation for accuracy and completeness, and I agree with the above.  - Breeonna Mone, FNP-C.

## 2019-02-11 ENCOUNTER — Encounter (INDEPENDENT_AMBULATORY_CARE_PROVIDER_SITE_OTHER): Payer: Self-pay | Admitting: Family Medicine

## 2019-02-26 ENCOUNTER — Other Ambulatory Visit: Payer: Self-pay | Admitting: Internal Medicine

## 2019-03-02 ENCOUNTER — Encounter (INDEPENDENT_AMBULATORY_CARE_PROVIDER_SITE_OTHER): Payer: Self-pay

## 2019-03-03 ENCOUNTER — Other Ambulatory Visit: Payer: Self-pay

## 2019-03-03 ENCOUNTER — Ambulatory Visit (INDEPENDENT_AMBULATORY_CARE_PROVIDER_SITE_OTHER): Payer: 59 | Admitting: Family Medicine

## 2019-03-03 ENCOUNTER — Encounter (INDEPENDENT_AMBULATORY_CARE_PROVIDER_SITE_OTHER): Payer: Self-pay

## 2019-03-03 ENCOUNTER — Encounter (INDEPENDENT_AMBULATORY_CARE_PROVIDER_SITE_OTHER): Payer: Self-pay | Admitting: Family Medicine

## 2019-03-03 DIAGNOSIS — R7303 Prediabetes: Secondary | ICD-10-CM | POA: Diagnosis not present

## 2019-03-03 DIAGNOSIS — E559 Vitamin D deficiency, unspecified: Secondary | ICD-10-CM | POA: Diagnosis not present

## 2019-03-03 DIAGNOSIS — Z6841 Body Mass Index (BMI) 40.0 and over, adult: Secondary | ICD-10-CM

## 2019-03-03 DIAGNOSIS — E7849 Other hyperlipidemia: Secondary | ICD-10-CM

## 2019-03-03 MED ORDER — VITAMIN D (ERGOCALCIFEROL) 1.25 MG (50000 UNIT) PO CAPS: ORAL_CAPSULE | ORAL | 0 refills | Status: DC

## 2019-03-03 NOTE — Progress Notes (Addendum)
Office: [phone]  /  Fax: [phone] TeleHealth Visit:  Kim Barnes has consented to this TeleHealth visit today via telephone call. The patient is located at home, the provider is located at the [facility] office. The participants in this visit include the listed provider and patient and any and all parties involved.   HPI:   Chief Complaint: OBESITY Kim Barnes is here to discuss her progress with her obesity treatment plan. She is on the Category 2 plan and is following her eating plan approximately 50 % of the time. She states she is exercising at the gym and walking on the treadmill for 60 minutes 3 days per week. Kim Barnes  We were unable to weight the patient today for this TeleHealth visit. Kim Barnes's weight at home today was 369.4 lbs. She has had a weight loss of 3 pounds since her last visit. She has lost 9 lbs since starting treatment with [name].  Vitamin D deficiency Kim Barnes has a diagnosis of vitamin D deficiency. Her last vitamin D level was at 21.6 on 11/11/18 and is not at goal. Kim Barnes is currently taking vit D and she denies nausea, vomiting, muscle weakness or fatigue.  Pre-Diabetes Kim Barnes has a diagnosis of prediabetes based on her elevated Hgb A1c and was informed this puts her at greater risk of developing diabetes. She is taking metformin currently and continues to work on diet and exercise to decrease risk of diabetes. She admits to cravings and denies polyphagia or hypoglycemia. Lab Results  Component Value Date   HGBA1C 5.7 (H) 11/11/2018    Hyperlipidemia Kim Barnes has hyperlipidemia and she is not on statin. She has been trying to improve her cholesterol levels with intensive lifestyle modification including a low saturated fat diet, exercise and weight loss. She denies any chest pain, claudication or myalgias. The 10-year ASCVD risk score Kim George DC Montez Hageman., Kim al., Kim Barnes) is: 1.7%   Values used to calculate the score:     Age: 43 years     Sex: Female  Is Non-Hispanic African American: Yes     Diabetic: No     Tobacco smoker: No     Systolic Blood Pressure: 130 mmHg     Is BP treated: Yes     HDL Cholesterol: 55 mg/dL     Total Cholesterol: 215 mg/dL Lab Results  Component Value Date   CHOL 215 (H) 11/11/2018   HDL 55 11/11/2018   LDLCALC 143 (H) 11/11/2018   LDLDIRECT 149.4 05/24/2010   TRIG 84 11/11/2018   CHOLHDL 4 12/31/2017    ASSESSMENT AND PLAN:  Vitamin D deficiency - Plan: VITAMIN D 25 Hydroxy (Vit-D Deficiency, Fractures), TSH, Vitamin D, Ergocalciferol, (DRISDOL) 1.25 MG (50000 UT) CAPS capsule  Prediabetes - Plan: Comprehensive metabolic panel, Hemoglobin A1c, Insulin, random, TSH  Other hyperlipidemia - Plan: Comprehensive metabolic panel, Lipid Panel With LDL/HDL Ratio, TSH  Class 3 severe obesity with serious comorbidity and body mass index (BMI) of 50.0 to 59.9 in adult, unspecified obesity type (HCC)  PLAN:  Vitamin D Deficiency Kim Barnes was informed that low vitamin D levels contributes to fatigue and are associated with obesity, breast, and colon cancer. She agrees to continue to take prescription Vit D ,000 IU every week #4 with no refills and will follow up for routine testing of vitamin D, at least 2-3 times per year. She was informed of the risk of over-replacement of vitamin D and agrees to not increase her dose unless she discusses this with [name] first.  We will check vitamin D level today and Kim Barnes agrees to follow up as directed.  Pre-Diabetes Kim Barnes will continue to work on weight loss, exercise, and decreasing simple carbohydrates in her diet to help decrease the risk of diabetes. We dicussed metformin including benefits and risks. She was informed that eating too many simple carbohydrates or too many calories at one sitting increases the likelihood of GI side effects. Kim Barnes will metformin for now and a prescription was not written today. We will check fasting insulin/glucose and A1c today. Kim Barnes  agreed to follow up with [name] as directed to monitor her progress.  Hyperlipidemia Kim Barnes was informed of the American Heart Association Guidelines emphasizing intensive lifestyle modifications as the first line treatment for hyperlipidemia. We discussed many lifestyle modifications today in depth, and Kim Barnes will continue to work on decreasing saturated fats such as fatty red meat, butter and many fried foods. She will also increase vegetables and lean protein in her diet and continue to work on exercise and weight loss efforts. We will check fasting lipid panel. Statin not recommended to pt since ASCVD risk score is 1.7%.  Obesity Kim Barnes is currently in the action stage of change. As such, her goal is to continue with weight loss efforts She has agreed to follow the Category 2 plan Kim Barnes will continue her current exercise regimen for weight loss and overall health benefits. Kim Barnes plans on buying dumb bells to use. We discussed the following Behavioral Modification Strategies today: planning for success, keeping healthy foods in the home, increasing lean protein intake and decreasing simple carbohydrates   We discussed substitutions for bread.  Kim Barnes has agreed to follow up with our clinic in 2 weeks. She was informed of the importance of frequent follow up visits to maximize her success with intensive lifestyle modifications for her multiple health conditions.  ALLERGIES: No Known Allergies  MEDICATIONS: Current Outpatient Medications on File Prior to Visit  Medication Sig Dispense Refill   ALBUTEROL IN Inhale into the lungs.     amLODipine (NORVASC) 5 MG tablet Take 1 tablet (5 mg total) by mouth daily. 30 tablet 0   benzonatate (TESSALON) 200 MG capsule Take 1 capsule (200 mg total) by mouth 2 (two) times daily as needed for cough. 30 capsule 1   budesonide-formoterol (SYMBICORT) 160-4.5 MCG/ACT inhaler Inhale 2 puffs into the lungs 2 (two) times daily. 3 Inhaler 3   buPROPion  (WELLBUTRIN SR) 200 MG 12 hr tablet Take 1 tablet (200 mg total) by mouth daily. 30 tablet 0   BYSTOLIC 20 MG TABS TAKE 1 TABLET BY MOUTH EVERY DAY 30 tablet 11   ibuprofen (ADVIL,MOTRIN) 200 MG tablet Take 1,200 mg by mouth daily as needed for moderate pain (tooth pain).     metFORMIN (GLUCOPHAGE) 500 MG tablet Take 1 tablet (500 mg total) by mouth daily with breakfast. 30 tablet 0   triamterene-hydrochlorothiazide (MAXZIDE-25) 37.5-25 MG tablet Take 1 tablet by mouth daily. 30 tablet 11   No current facility-administered medications on file prior to visit.     PAST MEDICAL HISTORY: Past Medical History:  Diagnosis Date   Anxiety    Asthma    prn inhaler   Back pain    Bell's palsy    Carpal tunnel syndrome of left wrist 01/2013   Chest pain    Cold hands and feet    Cough    Depression    Dry mouth    Dry skin    Fatigue    GERD (gastroesophageal  reflux disease)    occasional; no current med.   History of brain tumor 1996   no neurologic deficits   HTN (hypertension)    under control with med., has been on med. x 5 yr.   Joint pain    Obesity    Obstructive sleep apnea    PCOS (polycystic ovarian syndrome)    Polycystic ovarian syndrome    no current med.; irregular periods   Shortness of breath    Stress    Swelling of extremity    Wheezing     PAST SURGICAL HISTORY: Past Surgical History:  Procedure Laterality Date   BRAIN SURGERY  1996   exc. tumor   CARPAL TUNNEL RELEASE Left 02/10/2013   Procedure: CARPAL TUNNEL RELEASE;  Surgeon: Nicki Reaper, MD;  Location: [facility];  Service: Orthopedics;  Laterality: Left;  ANESTHESIA: IV REGIONAL FAB   HYSTEROSCOPY W/D&C  02/21/2006   HYSTEROSCOPY W/D&C  1/7/Kim Barnes   Procedure: DILATATION AND CURETTAGE /HYSTEROSCOPY;  Surgeon: Meriel Pica, MD;  Location: [facility];  Service: Gynecology;  Laterality: N/A;   LEEP  01/07/2004   TONSILLECTOMY  09/21/2002    SOCIAL  HISTORY: Social History   Tobacco Use   Smoking status: Never Smoker   Smokeless tobacco: Never Used  Substance Use Topics   Alcohol use: Yes    Comment: rare; socially   Drug use: No    FAMILY HISTORY: Family History  Problem Relation Age of Onset   Hypertension Other    Lung cancer Father        Smoker   Cancer Father        lung/ smoker   Heart disease Father    Alcoholism Father    Breast cancer Maternal Grandmother    Cancer Maternal Grandmother        breast   Emphysema Paternal Grandmother    Asthma Mother    Hyperlipidemia Mother    Hypertension Mother    Sleep apnea Mother    Asthma Maternal Aunt    Breast cancer Maternal Aunt        brain    Breast cancer Paternal Aunt        prostate    ROS: Review of Systems  Constitutional: Positive for weight loss.  Respiratory: Negative for shortness of breath (on exertion).   Cardiovascular: Negative for chest pain.  Gastrointestinal: Negative for nausea and vomiting.  Musculoskeletal:       Negative for muscle weakness  Endo/Heme/Allergies:       Positive for cravings Negative for polyphagia Negative for hypoglycemia    PHYSICAL EXAM: Pt in no acute distress  RECENT LABS AND TESTS: BMET    Component Value Date/Time   NA 140 11/11/2018 0902   K 3.9 11/11/2018 0902   CL 100 11/11/2018 0902   CO2 25 11/11/2018 0902   GLUCOSE 81 11/11/2018 0902   GLUCOSE 103 (H) 12/31/2017 0926   GLUCOSE 83 10/08/2006 1418   BUN 12 11/11/2018 0902   CREATININE 0.87 11/11/2018 0902   CALCIUM 9.5 11/11/2018 0902   GFRNONAA 84 11/11/2018 0902   GFRAA 96 11/11/2018 0902   Lab Results  Component Value Date   HGBA1C 5.7 (H) 11/11/2018   HGBA1C 5.7 (H) 07/28/2018   HGBA1C 5.8 (H) 02/26/2018   HGBA1C 5.3 03/31/2015   HGBA1C 6.0 04/20/2011   Lab Results  Component Value Date   INSULIN 26.3 (H) 11/11/2018   INSULIN 14.8 07/28/2018   INSULIN 26.4 (H) 02/26/2018  CBC    Component Value  Date/Time   WBC 5.4 02/26/2018 0936   WBC 5.7 12/31/2017 0926   RBC 4.74 02/26/2018 0936   RBC 4.82 12/31/2017 0926   HGB 13.6 02/26/2018 0936   HCT 42.2 02/26/2018 0936   PLT 281.0 12/31/2017 0926   MCV 89 02/26/2018 0936   MCH 28.7 02/26/2018 0936   MCH 29.3 02/04/2017 1851   MCHC 32.2 02/26/2018 0936   MCHC 33.8 12/31/2017 0926   RDW 14.7 02/26/2018 0936   LYMPHSABS 2.1 02/26/2018 0936   MONOABS 0.3 12/31/2017 0926   EOSABS 0.1 02/26/2018 0936   BASOSABS 0.0 02/26/2018 0936   Iron/TIBC/Ferritin/ %Sat No results found for: IRON, TIBC, FERRITIN, IRONPCTSAT Lipid Panel     Component Value Date/Time   CHOL 215 (H) 11/11/2018 0902   TRIG 84 11/11/2018 0902   HDL 55 11/11/2018 0902   CHOLHDL 4 12/31/2017 0926   VLDL 17.2 12/31/2017 0926   LDLCALC 143 (H) 11/11/2018 0902   LDLDIRECT 149.4 05/24/2010 0904   Hepatic Function Panel     Component Value Date/Time   PROT 7.8 11/11/2018 0902   ALBUMIN 4.3 11/11/2018 0902   AST 18 11/11/2018 0902   ALT 16 11/11/2018 0902   ALKPHOS 65 11/11/2018 0902   BILITOT 0.4 11/11/2018 0902   BILIDIR 0.0 12/31/2017 0926      Component Value Date/Time   TSH 2.540 02/26/2018 0936   TSH 1.78 12/31/2017 0926   TSH 1.12 10/01/2013 1056   Results for Barnes, Kim L (MRN [id number]) as of 03/03/2019 18:05  Ref. Range 11/11/2018 09:02  Vitamin D, 25-Hydroxy Latest Ref Range: 30.0 - 100.0 ng/mL 21.6 (L)     I, Kim Barnes, am acting as Energy manager for [employer], Kim Barnes

## 2019-03-04 ENCOUNTER — Encounter (INDEPENDENT_AMBULATORY_CARE_PROVIDER_SITE_OTHER): Payer: Self-pay | Admitting: Family Medicine

## 2019-03-04 DIAGNOSIS — E7849 Other hyperlipidemia: Secondary | ICD-10-CM | POA: Insufficient documentation

## 2019-03-07 IMAGING — CT CT ABD-PELV W/ CM
2 of 4 series · 16 of 46 positions shown, 18 images · IV contrast (iopamidol)
Comparison: None.

CLINICAL DATA: 41-year-old female with intermittent left lower
quadrant pain for the past few months now persistent and more severe
over the past 2 weeks. Initial encounter.

EXAM:
CT ABDOMEN AND PELVIS WITH CONTRAST
TECHNIQUE: Multidetector CT imaging of the abdomen and pelvis was performed
using the standard protocol following bolus administration of
intravenous contrast.
CONTRAST:  100mL [id number] IOPAMIDOL ([id number]) INJECTION 61%

[Series 2: abd/pel w · axial · 0.98mm/px · z∈[-458,-54]mm · 13 of 89 slices shown, 15 images]
[im 4/89  soft-tissue]
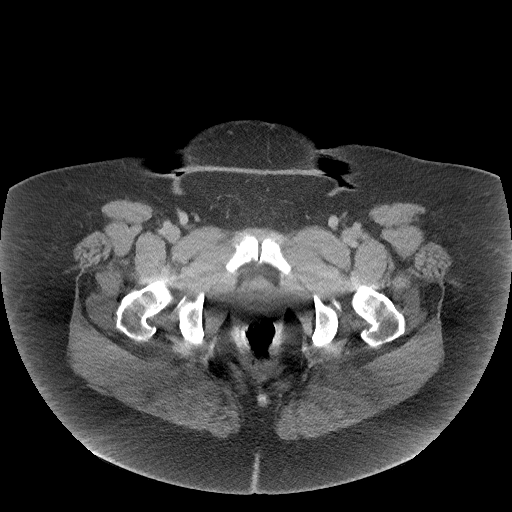
[im 4/89  bone]
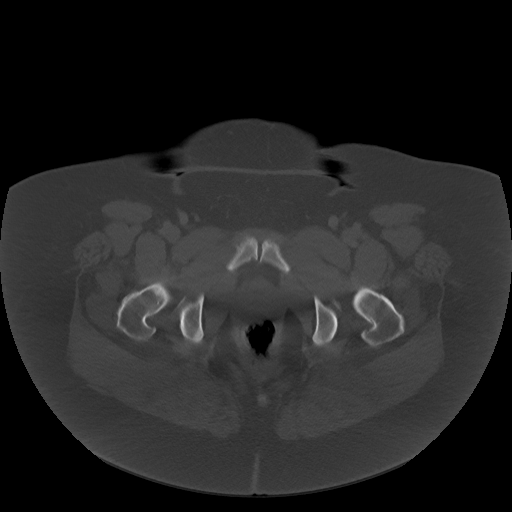
[im 12/89  soft-tissue]
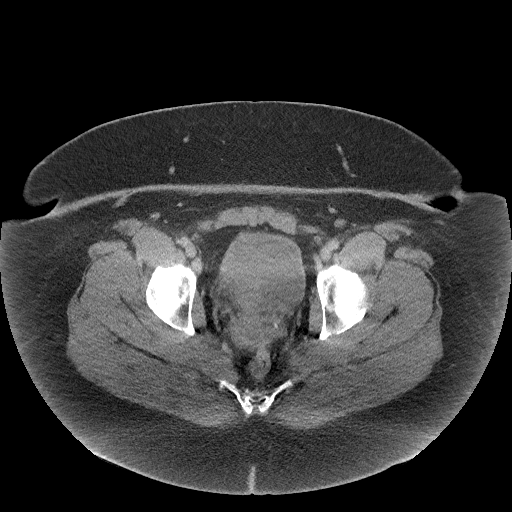
[im 19/89  soft-tissue]
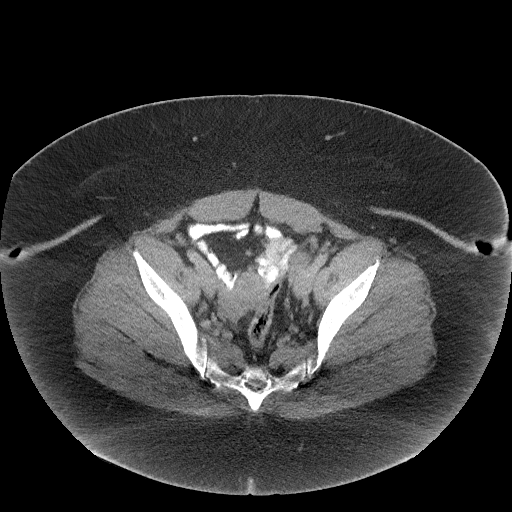
[im 26/89  soft-tissue]
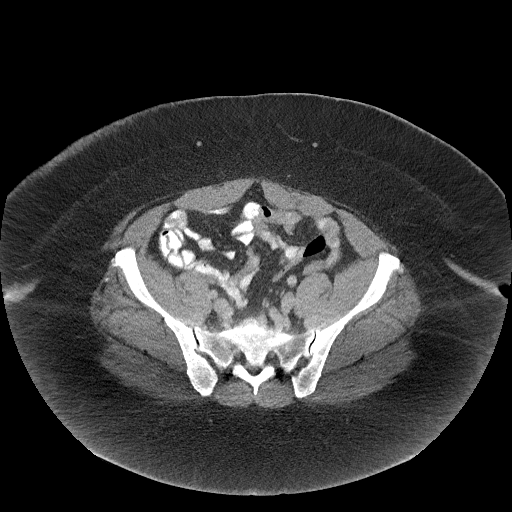
[im 30/89  soft-tissue]
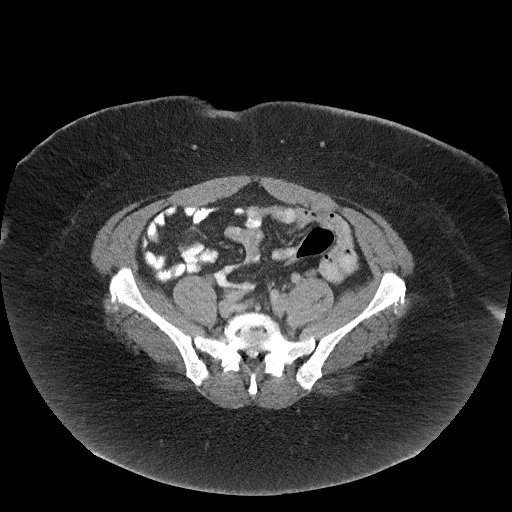
[im 37/89  soft-tissue]
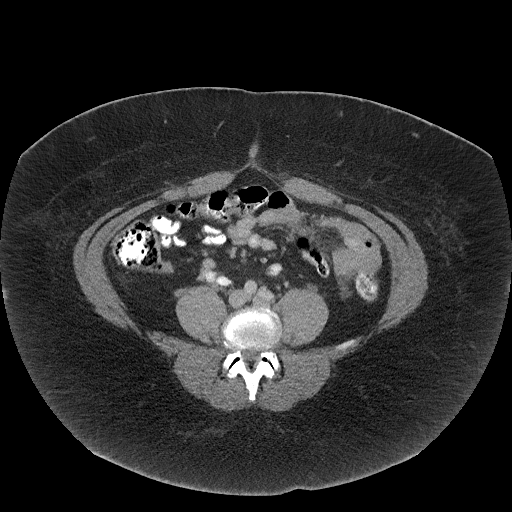
[im 45/89  soft-tissue]
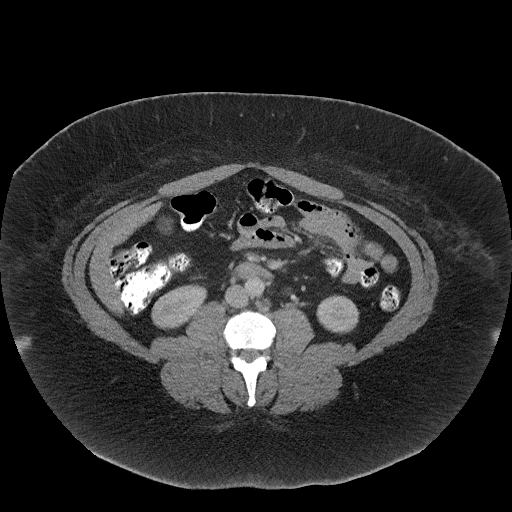
[im 52/89  soft-tissue]
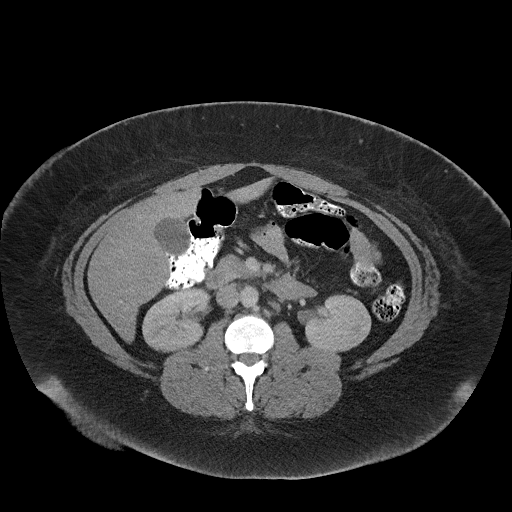
[im 59/89  soft-tissue]
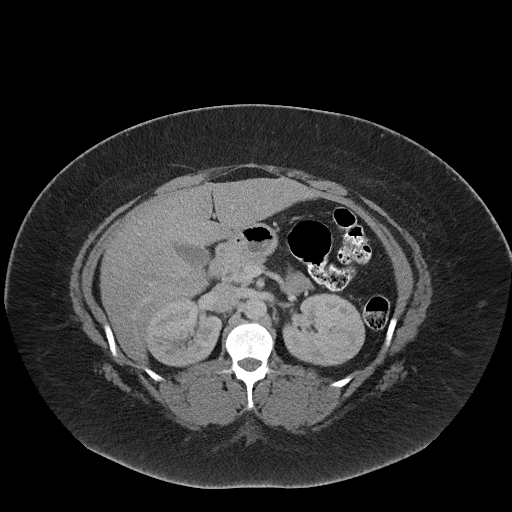
[im 59/89  bone]
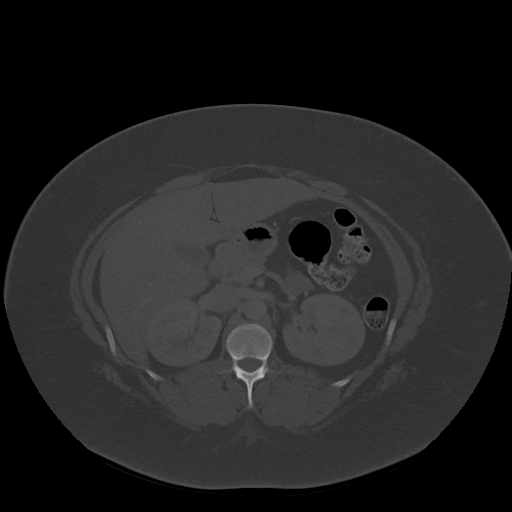
[im 63/89  soft-tissue]
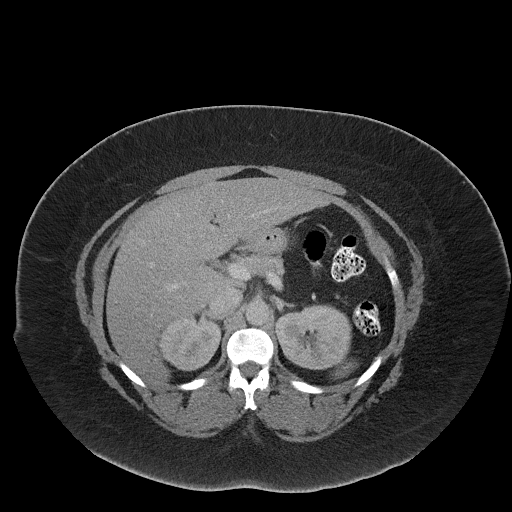
[im 70/89  soft-tissue]
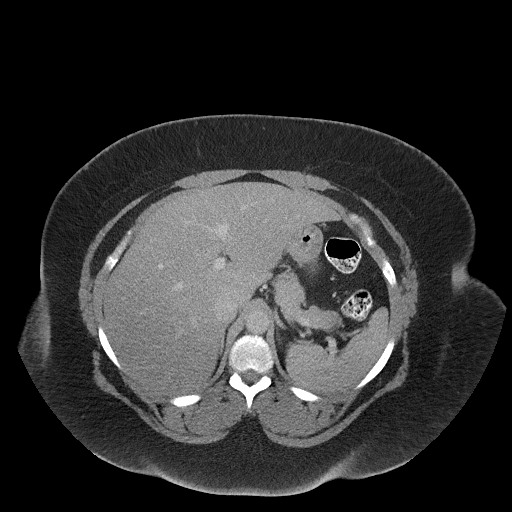
[im 78/89  soft-tissue]
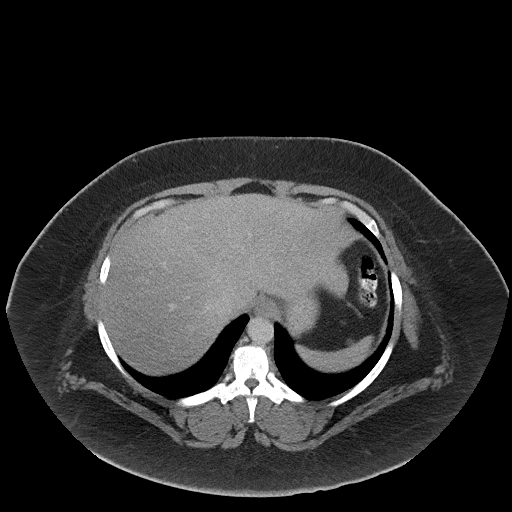
[im 85/89  soft-tissue]
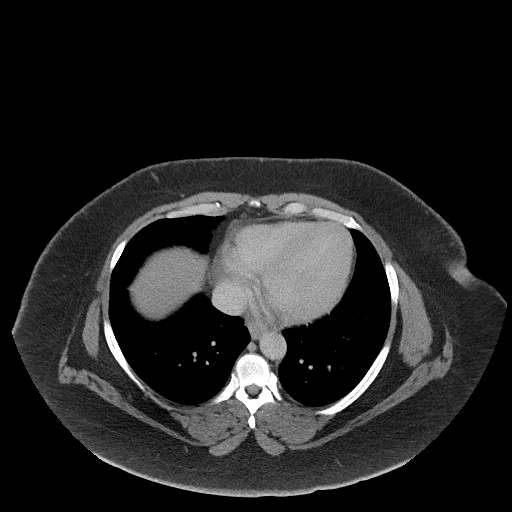

[Series 5: abd/pel w st · coronal · 0.87mm/px · 3 of 102 slices shown]
[im 34/102  soft-tissue]
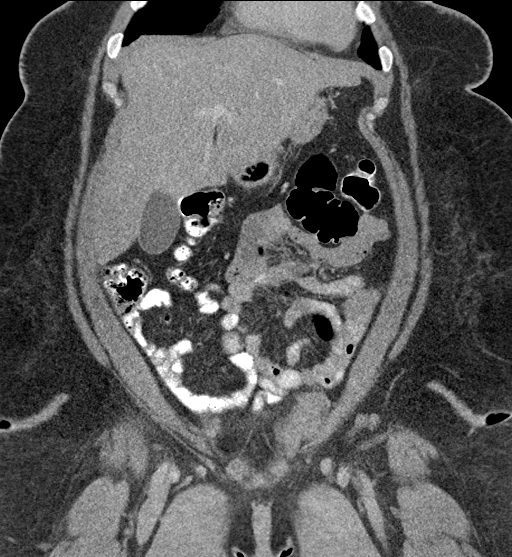
[im 45/102  soft-tissue]
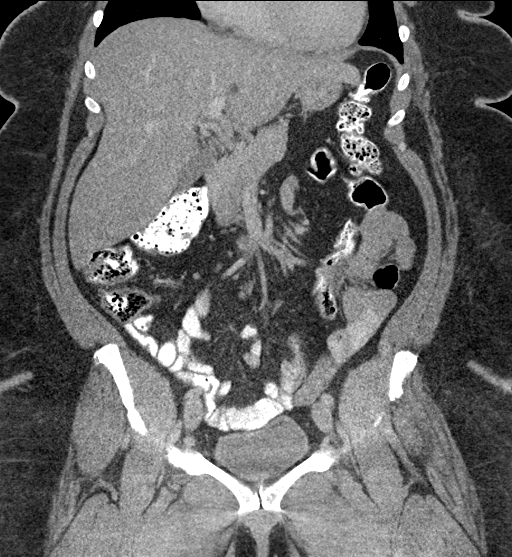
[im 57/102  soft-tissue]
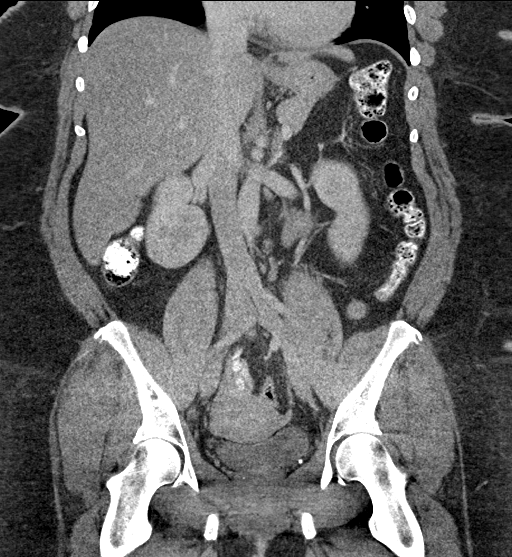

[16 of 46 positions shown; findings below may reference images not displayed]

FINDINGS: Lower chest: No worrisome lung base abnormality.  Mild cardiomegaly.

Hepatobiliary: Enlarged fatty liver spanning over 21.6 cm without
focal hepatic lesion. No calcified gallstone.

Pancreas: No pancreatic mass or inflammation.

Spleen: No splenic mass or enlargement.

Adrenals/Urinary Tract: No obstructing stone or hydronephrosis. No
renal or adrenal mass.

Noncontrast filled imaging urinary bladder without gross
abnormality. Evaluation of pelvic structures limited by artifact.

Stomach/Bowel: No extraluminal bowel inflammatory process is noted.
Appendix top-normal in size without surrounding inflammation.
Portions of the stomach and colon are under distended and evaluation
limited for detection of subtle abnormality.

Vascular/Lymphatic: No abdominal aortic aneurysm or large vessel
occlusion.

Increase number of normal/top-normal size retroperitoneal and
inguinal lymph nodes.

Reproductive: Evaluation limited by streak artifact in the pelvis.
No adnexal mass identified.

Other: No bowel containing hernia.  No free intraperitoneal air.

Musculoskeletal: Degenerative changes lower lumbar spine. No osseous
destructive lesion.
IMPRESSION: No extraluminal bowel inflammatory process.

No adnexal abnormality noted as cause of patient's discomfort.

Enlarged fatty liver.

Increase number of normal/top-normal size retroperitoneal and
inguinal lymph nodes of questionable etiology/ significance.

Degenerative changes lower lumbar spine.

Evaluation of pelvic structures slightly limited by artifact caused
by patient's habitus.

## 2019-03-11 LAB — COMPREHENSIVE METABOLIC PANEL
ALT: 17 IU/L (ref 0–32)
AST: 20 IU/L (ref 0–40)
Albumin/Globulin Ratio: 1.4 (ref 1.2–2.2)
Albumin: 4.4 g/dL (ref 3.8–4.8)
Alkaline Phosphatase: 67 IU/L (ref 39–117)
BUN/Creatinine Ratio: 15 (ref 9–23)
BUN: 12 mg/dL (ref 6–24)
Bilirubin Total: 0.4 mg/dL (ref 0.0–1.2)
CO2: 26 mmol/L (ref 20–29)
Calcium: 9.7 mg/dL (ref 8.7–10.2)
Chloride: 104 mmol/L (ref 96–106)
Creatinine, Ser: 0.79 mg/dL (ref 0.57–1.00)
GFR calc Af Amer: 108 mL/min/{1.73_m2} (ref >59)
GFR calc non Af Amer: 94 mL/min/{1.73_m2} (ref >59)
Globulin, Total: 3.2 g/dL (ref 1.5–4.5)
Glucose: 80 mg/dL (ref 65–99)
Potassium: 4.3 mmol/L (ref 3.5–5.2)
Sodium: 147 mmol/L — ABNORMAL HIGH (ref 134–144)
Total Protein: 7.6 g/dL (ref 6.0–8.5)

## 2019-03-11 LAB — LIPID PANEL WITH LDL/HDL RATIO
Cholesterol, Total: 216 mg/dL — ABNORMAL HIGH (ref 100–199)
HDL: 55 mg/dL (ref >39)
LDL Calculated: 145 mg/dL — ABNORMAL HIGH (ref 0–99)
LDl/HDL Ratio: 2.6 ratio (ref 0.0–3.2)
Triglycerides: 81 mg/dL (ref 0–149)
VLDL Cholesterol Cal: 16 mg/dL (ref 5–40)

## 2019-03-11 LAB — TSH: TSH: 3.24 u[IU]/mL (ref 0.450–4.500)

## 2019-03-11 LAB — HEMOGLOBIN A1C
Est. average glucose Bld gHb Est-mCnc: 120 mg/dL
Hgb A1c MFr Bld: 5.8 % — ABNORMAL HIGH (ref 4.8–5.6)

## 2019-03-11 LAB — VITAMIN D 25 HYDROXY (VIT D DEFICIENCY, FRACTURES): Vit D, 25-Hydroxy: 19.6 ng/mL — ABNORMAL LOW (ref 30.0–100.0)

## 2019-03-11 LAB — INSULIN, RANDOM: INSULIN: 34.6 u[IU]/mL — ABNORMAL HIGH (ref 2.6–24.9)

## 2019-03-18 ENCOUNTER — Ambulatory Visit (INDEPENDENT_AMBULATORY_CARE_PROVIDER_SITE_OTHER): Payer: 59 | Admitting: Family Medicine

## 2019-03-18 ENCOUNTER — Encounter (INDEPENDENT_AMBULATORY_CARE_PROVIDER_SITE_OTHER): Payer: Self-pay | Admitting: Family Medicine

## 2019-03-18 ENCOUNTER — Other Ambulatory Visit: Payer: Self-pay

## 2019-03-18 DIAGNOSIS — Z6841 Body Mass Index (BMI) 40.0 and over, adult: Secondary | ICD-10-CM

## 2019-03-18 DIAGNOSIS — R7303 Prediabetes: Secondary | ICD-10-CM

## 2019-03-18 DIAGNOSIS — F3289 Other specified depressive episodes: Secondary | ICD-10-CM | POA: Diagnosis not present

## 2019-03-18 DIAGNOSIS — E7849 Other hyperlipidemia: Secondary | ICD-10-CM | POA: Diagnosis not present

## 2019-03-18 DIAGNOSIS — E559 Vitamin D deficiency, unspecified: Secondary | ICD-10-CM | POA: Diagnosis not present

## 2019-03-18 MED ORDER — VITAMIN D (ERGOCALCIFEROL) 1.25 MG (50000 UNIT) PO CAPS: ORAL_CAPSULE | ORAL | 0 refills | Status: DC

## 2019-03-18 MED ORDER — BUPROPION HCL ER (SR) 200 MG PO TB12: 200.0000 mg | ORAL_TABLET | Freq: Every day | ORAL | 0 refills | Status: DC

## 2019-03-18 NOTE — Progress Notes (Signed)
Office: [phone]  /  Fax: [phone] TeleHealth Visit:  Kim Barnes has verbally consented to this TeleHealth visit today. The patient is located at home, the provider is located at the [facility] office. The participants in this visit include the listed provider and patient. The visit was conducted today via FaceTime.  HPI:   Chief Complaint: OBESITY Kim Barnes is here to discuss her progress with her obesity treatment plan. She is on the Category 2 plan and is following her eating plan approximately 30% of the time. She states she is exercising 0 minutes 0 times per week. Kim Barnes states she weighed 373.4 today reflecting a gain of a few pounds. She states she is now eating off plan quite a bit. We were unable to weigh the patient today for this TeleHealth visit. She feels as if she has gained 1 lb since her last visit. She has lost 6 lbs since starting treatment with [name].  Depression with emotional eating behaviors Kim Barnes is struggling with emotional eating and using food for comfort to the extent that it is negatively impacting her health. She often snacks when she is not hungry. Kim Barnes sometimes feels she is out of control and then feels guilty that she made poor food choices. She has been working on behavior modification techniques to help reduce her emotional eating and has not been successful. She shows no sign of suicidal or homicidal ideations. She reports not taking bupropion consistently and also reports increased stress eating recently.  Depression screen PHQ 2/9 02/26/2018  Decreased Interest 2  Down, Depressed, Hopeless 1  PHQ - 2 Score 3  Altered sleeping 2  Tired, decreased energy 2  Change in appetite 2  Feeling bad or failure about yourself  2  Trouble concentrating 1  Moving slowly or fidgety/restless 1  Suicidal thoughts 0  PHQ-9 Score 13  Difficult doing work/chores Somewhat difficult   Vitamin D deficiency Kim Barnes has a diagnosis of Vitamin D  deficiency, which is not at goal. Her last VItamin D level was reported to be 19.6 on 03/10/2019, decreased from 21.6 on 11/11/2018. She is currently taking prescription Vit D and denies nausea, vomiting, muscle weakness, or fatigue.  Pre-Diabetes Kim Barnes has a diagnosis of prediabetes based on her elevated Hgb A1c and was informed this puts her at greater risk of developing diabetes. She states she is taking metformin sporadically and continues to work on diet and exercise to decrease risk of diabetes.  Hyperlipidemia Kim Barnes has hyperlipidemia and has been trying to improve her cholesterol levels with intensive lifestyle modification including a low saturated fat diet, exercise and weight loss. Her LDL level was elevated at 774 on 03/10/2019, up from 143 on 11/11/2018. Her ASCVD risk score is 1.7%. She currently is not on a statin. The 10-year ASCVD risk score Denman George DC Montez Hageman., et al., 2013) is: 1.7%   Values used to calculate the score:     Age: 61 years     Sex: Female     Is Non-Hispanic African American: Yes     Diabetic: No     Tobacco smoker: No     Systolic Blood Pressure: 130 mmHg     Is BP treated: Yes     HDL Cholesterol: 55 mg/dL     Total Cholesterol: 216 mg/dL   ASSESSMENT AND PLAN:  Vitamin D deficiency - Plan: Vitamin D, Ergocalciferol, (DRISDOL) 1.25 MG (50000 UT) CAPS capsule  Prediabetes  Other hyperlipidemia  Other depression - with emotional eating -  Plan: buPROPion (WELLBUTRIN SR) 200 MG 12 hr tablet  Class 3 severe obesity with serious comorbidity and body mass index (BMI) of 60.0 to 69.9 in adult, unspecified obesity type (HCC)  PLAN:  Depression with Emotional Eating Behaviors We discussed behavior modification techniques today to help [name] deal with her emotional eating and depression. She was given a refill on her bupropion 200 mg QAM #30 with 0 refills and agrees to follow-up with our clinic in 2 weeks. She was instructed to take this consistently.   Vitamin D Deficiency [name] was informed that low Vitamin D levels contributes to fatigue and are associated with obesity, breast, and colon cancer. She agrees to continue to take prescription Vit D @ 50,000 IU every week #4 with 0 refills and will also add OTC Vitamin D3 2,000 IU daily. She will follow-up for routine testing of Vitamin D, at least 2-3 times per year. She was informed of the risk of over-replacement of Vitamin D and agrees to not increase her dose unless she discusses this with [name] first. [name] agrees to follow-up with our clinic in 2 weeks.  Pre-Diabetes Kim Barnes will continue to work on weight loss, exercise, and decreasing simple carbohydrates in her diet to help decrease the risk of diabetes. We dicussed metformin including benefits and risks. She was informed that eating too many simple carbohydrates or too many calories at one sitting increases the likelihood of GI side effects. Kim Barnes is currently taking metformin and a prescription was not written today. She was advised to take metformin consistently. Kim Barnes agrees to follow-up with our clinic in 2 weeks.   Hyperlipidemia [name] was informed of the American Heart Association Guidelines emphasizing intensive lifestyle modifications as the first line treatment for hyperlipidemia. We discussed many lifestyle modifications today in depth, and [name] will continue to work on decreasing saturated fats such as fatty red meat, butter and many fried foods. She will also increase vegetables and lean protein in her diet and continue to work on exercise and weight loss efforts. Statin is not indicated at this time according to ASCVD risk score.    Obesity [name] is currently in the action stage of change. As such, her goal is to continue with weight loss efforts. She has agreed to follow the Category 2 plan. [name] has been instructed to walk 30 minutes 3 times per week. We discussed the following Behavioral Modification Strategies today:  decreasing simple carbohydrates, ways to avoid boredom eating, emotional eating strategies, and planning for success.  [name] has agreed to follow-up with our clinic in 2 weeks. She was informed of the importance of frequent follow-up visits to maximize her success with intensive lifestyle modifications for her multiple health conditions.  ALLERGIES: No Known Allergies  MEDICATIONS: Current Outpatient Medications on File Prior to Visit  Medication Sig Dispense Refill  . ALBUTEROL IN Inhale into the lungs.    [name] amLODipine (NORVASC) 5 MG tablet Take 1 tablet (5 mg total) by mouth daily. 30 tablet 0  . benzonatate (TESSALON) 200 MG capsule Take 1 capsule (200 mg total) by mouth 2 (two) times daily as needed for cough. 30 capsule 1  . budesonide-formoterol (SYMBICORT) 160-4.5 MCG/ACT inhaler Inhale 2 puffs into the lungs 2 (two) times daily. 3 Inhaler 3  . BYSTOLIC 20 MG TABS TAKE 1 TABLET BY MOUTH EVERY DAY 30 tablet 11  . ibuprofen (ADVIL,MOTRIN) 200 MG tablet Take 1,200 mg by mouth daily as needed for moderate pain (tooth pain).    . metFORMIN (GLUCOPHAGE)  500 MG tablet Take 1 tablet (500 mg total) by mouth daily with breakfast. 30 tablet 0  . triamterene-hydrochlorothiazide (MAXZIDE-25) 37.5-25 MG tablet Take 1 tablet by mouth daily. 30 tablet 11   No current facility-administered medications on file prior to visit.     PAST MEDICAL HISTORY: Past Medical History:  Diagnosis Date  . Anxiety   . Asthma    prn inhaler  . Back pain   . Bell's palsy   . Carpal tunnel syndrome of left wrist 01/2013  . Chest pain   . Cold hands and feet   . Cough   . Depression   . Dry mouth   . Dry skin   . Fatigue   . GERD (gastroesophageal reflux disease)    occasional; no current med.  [name] History of brain tumor 1996   no neurologic deficits  . HTN (hypertension)    under control with med., has been on med. x 5 yr.  . Joint pain   . Obesity   . Obstructive sleep apnea   . PCOS (polycystic  ovarian syndrome)   . Polycystic ovarian syndrome    no current med.; irregular periods  . Shortness of breath   . Stress   . Swelling of extremity   . Wheezing     PAST SURGICAL HISTORY: Past Surgical History:  Procedure Laterality Date  . BRAIN SURGERY  1996   exc. tumor  . CARPAL TUNNEL RELEASE Left 02/10/2013   Procedure: CARPAL TUNNEL RELEASE;  Surgeon: Nicki Reaper, MD;  Location: [facility];  Service: Orthopedics;  Laterality: Left;  ANESTHESIA: IV REGIONAL FAB  . HYSTEROSCOPY W/D&C  02/21/2006  . HYSTEROSCOPY W/D&C  12/17/2011   Procedure: DILATATION AND CURETTAGE /HYSTEROSCOPY;  Surgeon: Meriel Pica, MD;  Location: [facility];  Service: Gynecology;  Laterality: N/A;  . LEEP  01/07/2004  . TONSILLECTOMY  09/21/2002    SOCIAL HISTORY: Social History   Tobacco Use  . Smoking status: Never Smoker  . Smokeless tobacco: Never Used  Substance Use Topics  . Alcohol use: Yes    Comment: rare; socially  . Drug use: No    FAMILY HISTORY: Family History  Problem Relation Age of Onset  . Hypertension Other   . Lung cancer Father        Smoker  . Cancer Father        lung/ smoker  . Heart disease Father   . Alcoholism Father   . Breast cancer Maternal Grandmother   . Cancer Maternal Grandmother        breast  . Emphysema Paternal Grandmother   . Asthma Mother   . Hyperlipidemia Mother   . Hypertension Mother   . Sleep apnea Mother   . Asthma Maternal Aunt   . Breast cancer Maternal Aunt        brain   . Breast cancer Paternal Aunt        prostate   ROS: Review of Systems  Constitutional: Negative for malaise/fatigue.  Gastrointestinal: Negative for nausea and vomiting.  Musculoskeletal:       Negative for muscle weakness.  Psychiatric/Behavioral: Positive for depression (emotional eating).   PHYSICAL EXAM: Pt in no acute distress  RECENT LABS AND TESTS: BMET    Component Value Date/Time   NA 147 (H) 03/10/2019 0932   K 4.3 03/10/2019  0932   CL 104 03/10/2019 0932   CO2 26 03/10/2019 0932   GLUCOSE 80 03/10/2019 0932   GLUCOSE 103 (H)  12/31/2017 0926   GLUCOSE 83 10/08/2006 1418   BUN 12 03/10/2019 0932   CREATININE 0.79 03/10/2019 0932   CALCIUM 9.7 03/10/2019 0932   GFRNONAA 94 03/10/2019 0932   GFRAA 108 03/10/2019 0932   Lab Results  Component Value Date   HGBA1C 5.8 (H) 03/10/2019   HGBA1C 5.7 (H) 11/11/2018   HGBA1C 5.7 (H) 07/28/2018   HGBA1C 5.8 (H) 02/26/2018   HGBA1C 5.3 03/31/2015   Lab Results  Component Value Date   INSULIN 34.6 (H) 03/10/2019   INSULIN 26.3 (H) 11/11/2018   INSULIN 14.8 07/28/2018   INSULIN 26.4 (H) 02/26/2018   CBC    Component Value Date/Time   WBC 5.4 02/26/2018 0936   WBC 5.7 12/31/2017 0926   RBC 4.74 02/26/2018 0936   RBC 4.82 12/31/2017 0926   HGB 13.6 02/26/2018 0936   HCT 42.2 02/26/2018 0936   PLT 281.0 12/31/2017 0926   MCV 89 02/26/2018 0936   MCH 28.7 02/26/2018 0936   MCH 29.3 02/04/2017 1851   MCHC 32.2 02/26/2018 0936   MCHC 33.8 12/31/2017 0926   RDW 14.7 02/26/2018 0936   LYMPHSABS 2.1 02/26/2018 0936   MONOABS 0.3 12/31/2017 0926   EOSABS 0.1 02/26/2018 0936   BASOSABS 0.0 02/26/2018 0936   Iron/TIBC/Ferritin/ %Sat No results found for: IRON, TIBC, FERRITIN, IRONPCTSAT Lipid Panel     Component Value Date/Time   CHOL 216 (H) 03/10/2019 0932   TRIG 81 03/10/2019 0932   HDL 55 03/10/2019 0932   CHOLHDL 4 12/31/2017 0926   VLDL 17.2 12/31/2017 0926   LDLCALC 145 (H) 03/10/2019 0932   LDLDIRECT 149.4 05/24/2010 0904   Hepatic Function Panel     Component Value Date/Time   PROT 7.6 03/10/2019 0932   ALBUMIN 4.4 03/10/2019 0932   AST 20 03/10/2019 0932   ALT 17 03/10/2019 0932   ALKPHOS 67 03/10/2019 0932   BILITOT 0.4 03/10/2019 0932   BILIDIR 0.0 12/31/2017 0926      Component Value Date/Time   TSH 3.240 03/10/2019 0932   TSH 2.540 02/26/2018 0936   TSH 1.78 12/31/2017 0926   Results for Hollingworth, Amari L (MRN [id number]) as of  03/18/2019 10:16  Ref. Range 03/10/2019 09:32  Vitamin D, 25-Hydroxy Latest Ref Range: 30.0 - 100.0 ng/mL 19.6 (L)    I, Marianna Paymentenise Haag, am acting as Energy managertranscriptionist for AshlandDawn Jozette Castrellon, FNP-C.  I have reviewed the above documentation for accuracy and completeness, and I agree with the above.  - Rayane Gallardo, FNP-C.

## 2019-03-19 ENCOUNTER — Ambulatory Visit (INDEPENDENT_AMBULATORY_CARE_PROVIDER_SITE_OTHER): Payer: 59 | Admitting: Family Medicine

## 2019-03-29 IMAGING — DX DG CHEST 2V
2 series · 2 of 2 positions shown · non-contrast
Comparison: Chest radiograph dated 06/01/2016

CLINICAL DATA: 41-year-old female with chest pain for

EXAM:
CHEST  2 VIEW

[chest pa]
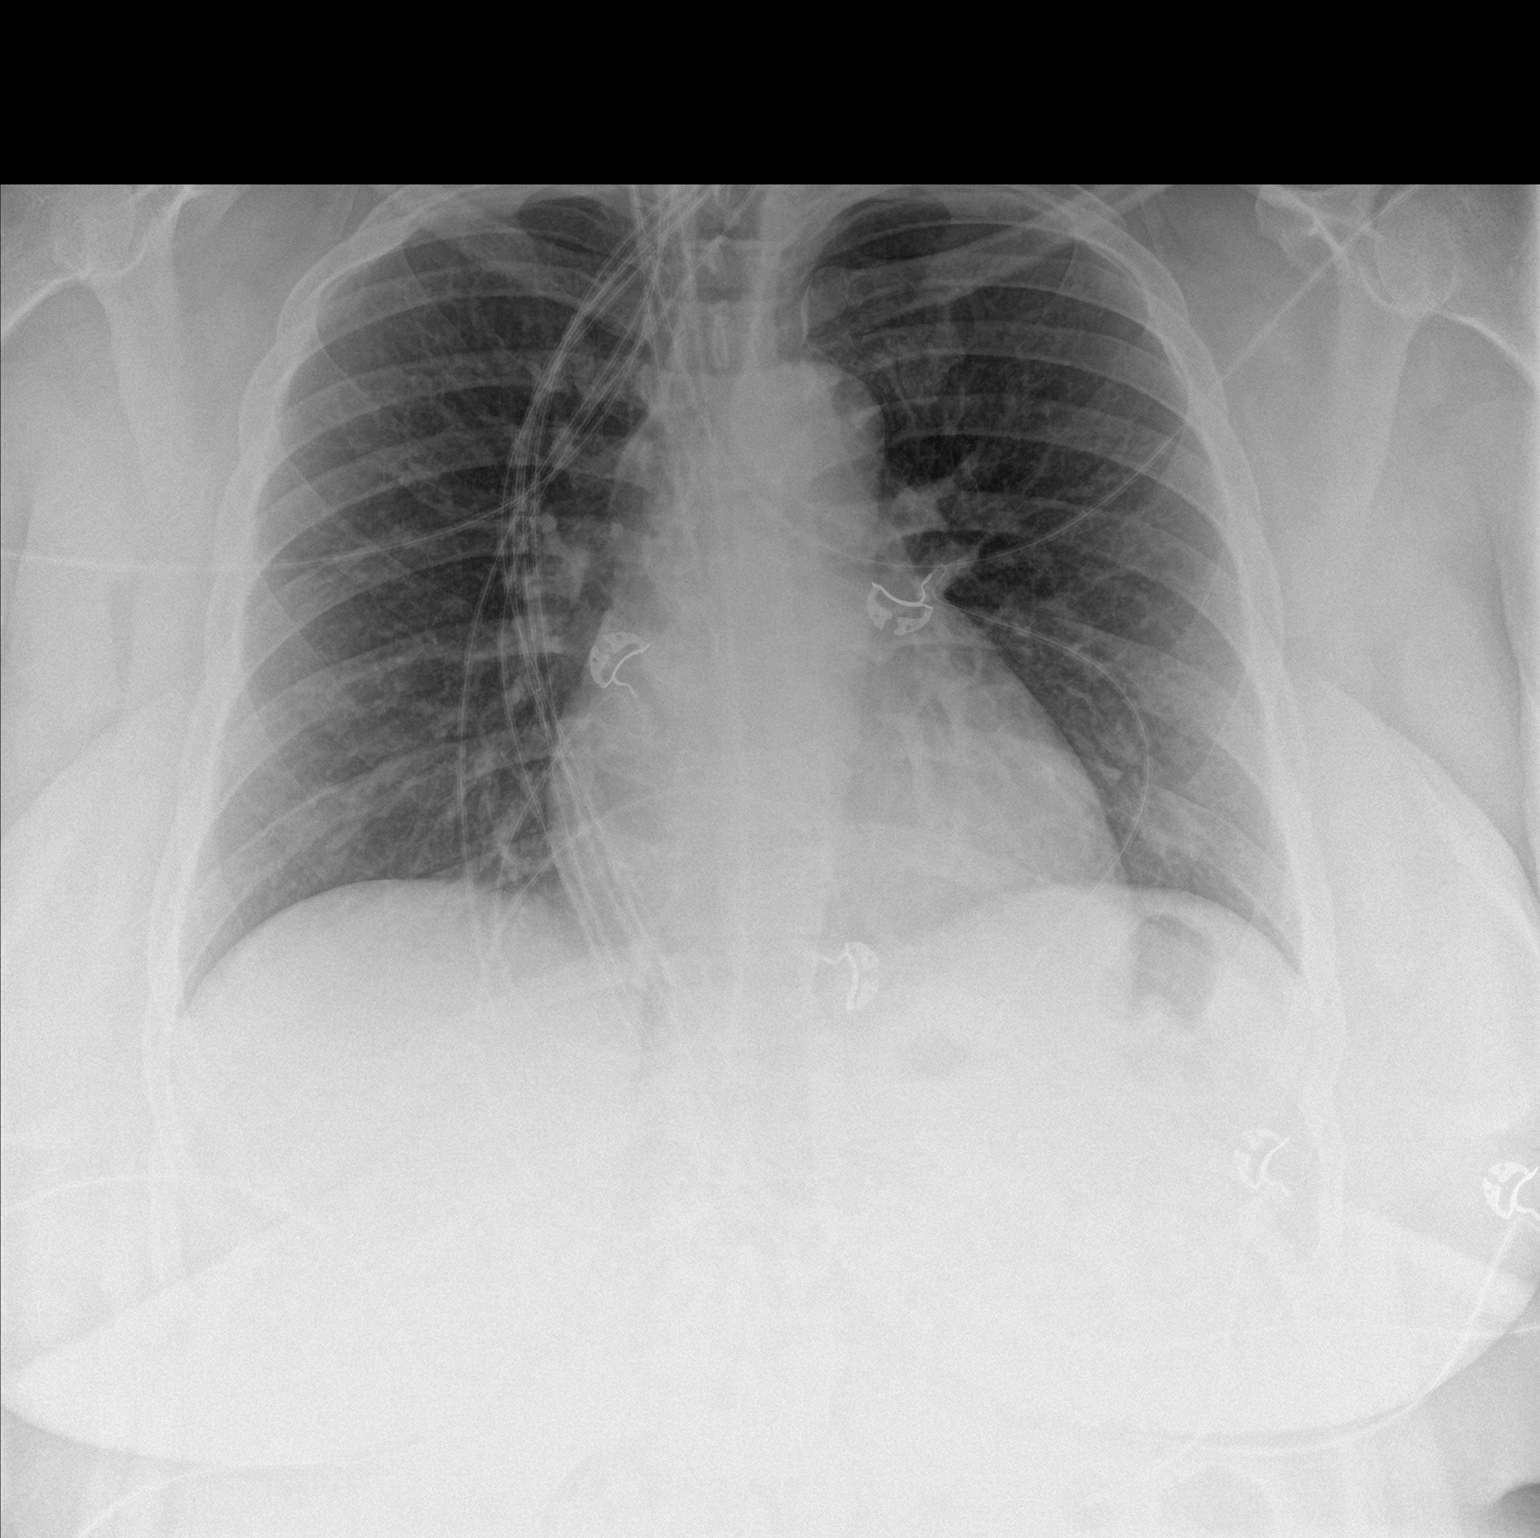

[chest lat]
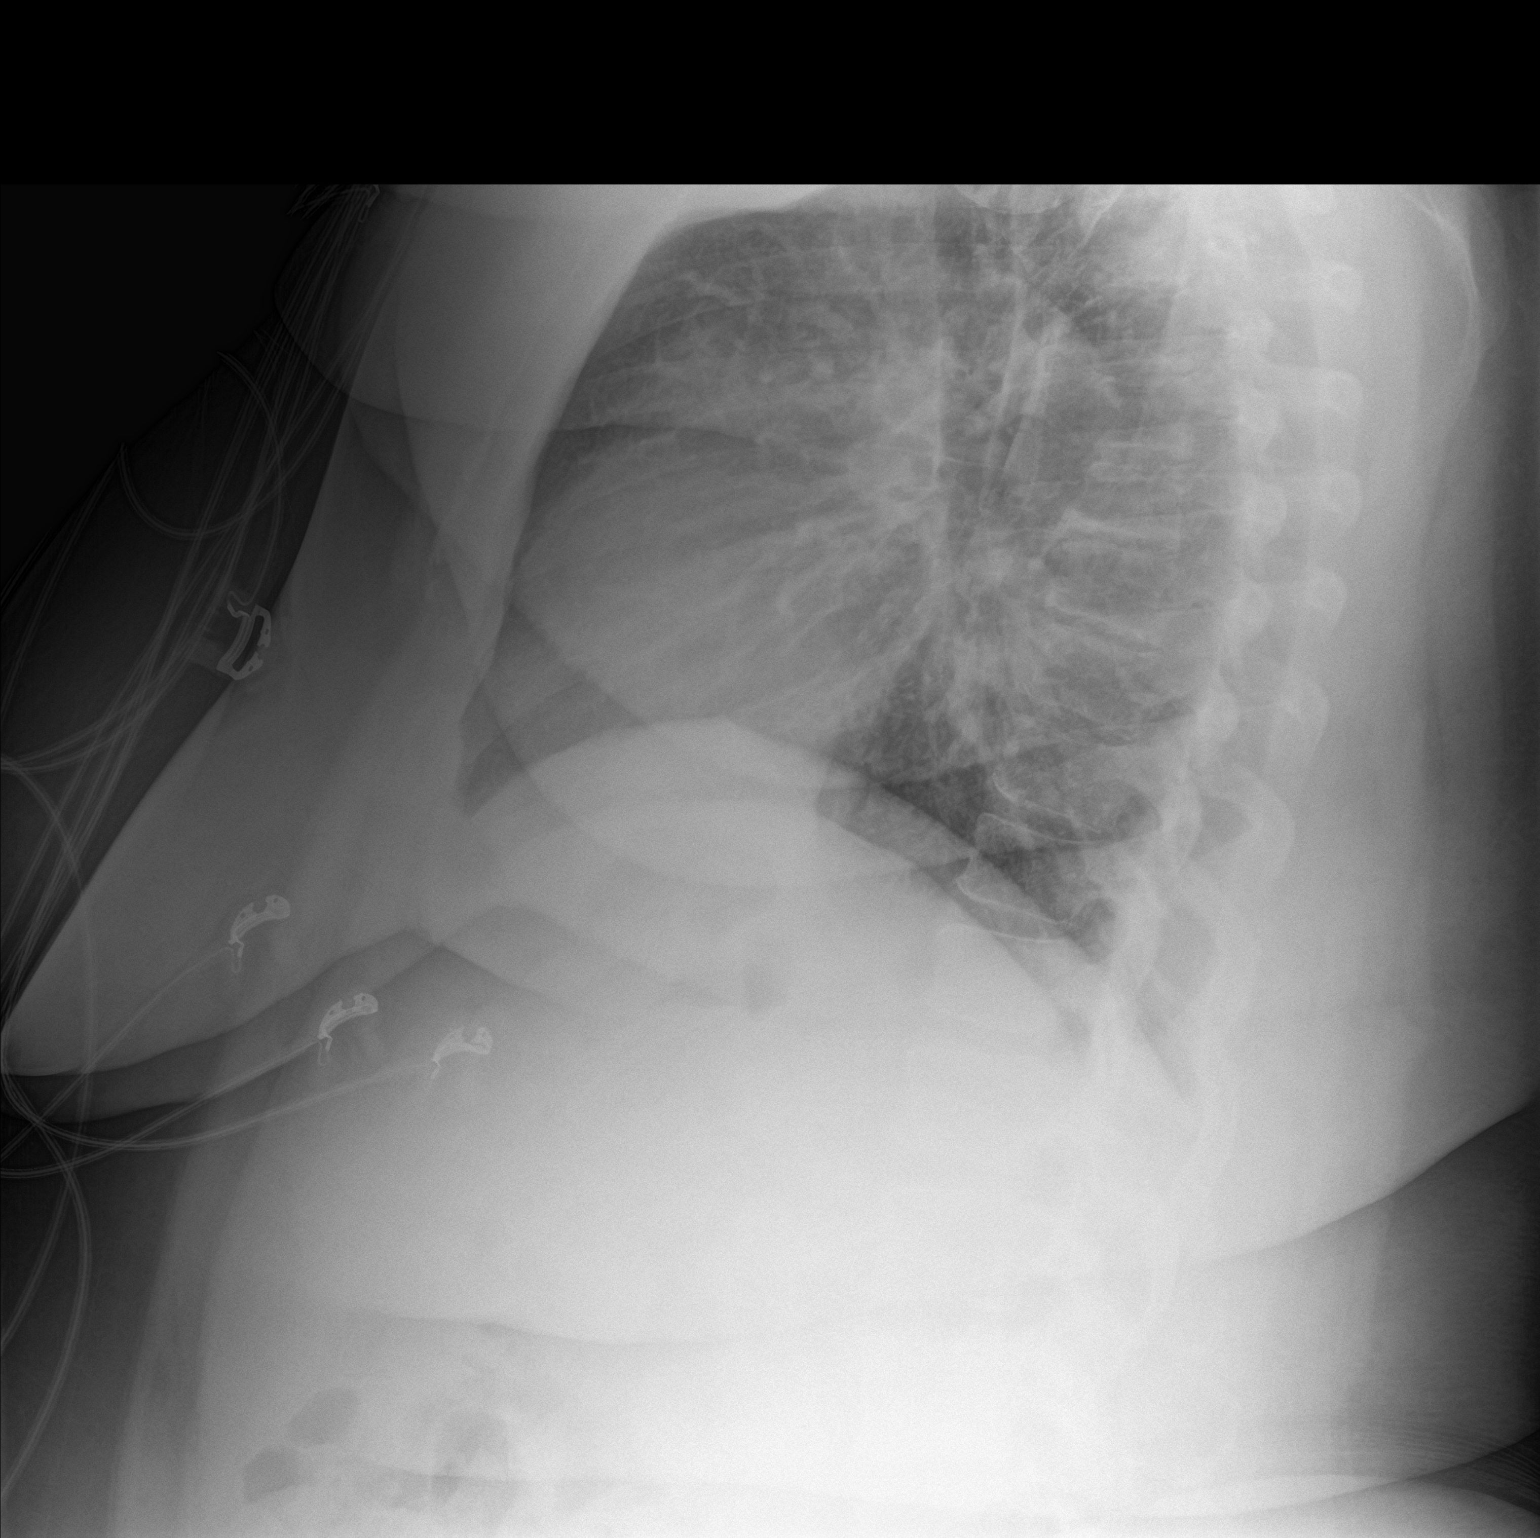

[2 of 2 positions shown; findings below may reference images not displayed]

FINDINGS: The heart size and mediastinal contours are within normal limits.
Both lungs are clear. The visualized skeletal structures are
unremarkable.
IMPRESSION: No active cardiopulmonary disease.

## 2019-04-02 ENCOUNTER — Ambulatory Visit (INDEPENDENT_AMBULATORY_CARE_PROVIDER_SITE_OTHER): Payer: 59 | Admitting: Family Medicine

## 2019-04-02 ENCOUNTER — Encounter (INDEPENDENT_AMBULATORY_CARE_PROVIDER_SITE_OTHER): Payer: Self-pay | Admitting: Family Medicine

## 2019-04-02 ENCOUNTER — Other Ambulatory Visit: Payer: Self-pay

## 2019-04-02 DIAGNOSIS — F3289 Other specified depressive episodes: Secondary | ICD-10-CM

## 2019-04-02 DIAGNOSIS — R7303 Prediabetes: Secondary | ICD-10-CM | POA: Diagnosis not present

## 2019-04-02 DIAGNOSIS — I10 Essential (primary) hypertension: Secondary | ICD-10-CM

## 2019-04-02 DIAGNOSIS — Z6841 Body Mass Index (BMI) 40.0 and over, adult: Secondary | ICD-10-CM

## 2019-04-02 MED ORDER — BUPROPION HCL ER (SR) 200 MG PO TB12: 200.0000 mg | ORAL_TABLET | Freq: Every day | ORAL | 0 refills | Status: DC

## 2019-04-02 MED ORDER — METFORMIN HCL 500 MG PO TABS: 500.0000 mg | ORAL_TABLET | Freq: Every day | ORAL | 0 refills | Status: DC

## 2019-04-02 MED ORDER — AMLODIPINE BESYLATE 5 MG PO TABS: 5.0000 mg | ORAL_TABLET | Freq: Every day | ORAL | 0 refills | Status: DC

## 2019-04-02 NOTE — Progress Notes (Signed)
Office: [phone]  /  Fax: [phone] TeleHealth Visit:  Kim Barnes has verbally consented to this TeleHealth visit today. The patient is located at home, the provider is located at the [facility] office. The participants in this visit include the listed provider and patient. The visit was conducted today via Webex.  HPI:   Chief Complaint: OBESITY Kim Barnes is here to discuss her progress with her obesity treatment plan. She is on the Category 2 plan and is following her eating plan approximately 40 % of the time. She states she is exercising 0 minutes 0 times per week. Kim Barnes has maintained weight, but is not sticking to her plan well. She is eating out more frequently and has not been active.  We were unable to weigh the patient today for this TeleHealth visit. She feels as if she has gained weight since her last visit. She has lost 6 lbs since starting treatment with us.  Hypertension Kim Barnes is a 43 y.o. female with hypertension. Kim Barnes plans to buy a blood pressure cuff. HTN has been moderately well-controlled an Maxzide and norvasc. She is working on weight loss to help control her blood pressure with the goal of decreasing her risk of heart attack and stroke. Kim Barnes denies chest pain or shortness of breath. BP Readings from Last 3 Encounters:  02/10/19 130/72  01/27/19 (!) 149/84  01/12/19 (!) 146/88    Depression with emotional eating behaviors Kim Barnes is taking bupropion more consistently. Her stress eating is better controlled with bupropion.She has been working on behavior modification techniques to help reduce her emotional eating and has been somewhat successful.  Pre-Diabetes Kim Barnes has a diagnosis of pre-diabetes based on her elevated Hgb A1c and was informed this puts her at greater risk of developing diabetes. She is taking metformin currently, but does not take it consistently. She reports GI side effects with poor diet and metformin. She  continues to work on diet and exercise to decrease risk of diabetes. She denies hypoglycemia.  Lab Results  Component Value Date   HGBA1C 5.8 (H) 03/10/2019    ASSESSMENT AND PLAN:  Essential hypertension - Plan: amLODipine (NORVASC) 5 MG tablet  Prediabetes - Plan: metFORMIN (GLUCOPHAGE) 500 MG tablet  Other depression - with emotional eating - Plan: buPROPion (WELLBUTRIN SR) 200 MG 12 hr tablet  Class 3 severe obesity with serious comorbidity and body mass index (BMI) of 60.0 to 69.9 in adult, unspecified obesity type (HCC)  PLAN:  Hypertension We discussed sodium restriction, working on healthy weight loss, and a regular exercise program as the means to achieve improved blood pressure control. We will continue to monitor her blood pressure as well as her progress with the above lifestyle modifications. She will continue her amlodipine 5 mg qd #30 with no refills and Maxzide and will watch for signs of hypotension as she continues her lifestyle modifications. Kim Barnes agreed with this plan and agreed to follow up as directed.  Pre-Diabetes Kim Barnes will continue to work on weight loss, exercise, and decreasing simple carbohydrates in her diet to help decrease the risk of diabetes. She was informed that eating too many simple carbohydrates or too many calories at one sitting increases the likelihood of GI side effects. Kim Barnes agreed to improve compliance with metformin and to limit carbs to decrease GI side effects. We will refill metformin 500 mg qAM #30 with no refills and Kim Barnes agreed to follow up with us as directed to monitor her progress in 2 weeks.  Depression with Emotional Eating Behaviors We discussed behavior modification techniques today to help Kim Barnes deal with her emotional eating and depression. She has agreed to take bupropion SR 200 mg qd #30 with no refills and agreed to follow up as directed.  Obesity Kim Barnes is currently in the action stage of change. As such, her goal  is to continue with weight loss efforts. She has agreed to follow the Category 2 plan. Kim Barnes has been instructed to do exercise videos 2 times per week. We discussed the following Behavioral Modification Strategies today: decreasing simple carbohydrates, decrease eating out, planning for success, and work on meal planning and easy cooking plans.  Kim Barnes has agreed to follow up with our clinic in 2 weeks. She was informed of the importance of frequent follow up visits to maximize her success with intensive lifestyle modifications for her multiple health conditions.  ALLERGIES: No Known Allergies  MEDICATIONS: Current Outpatient Medications on File Prior to Visit  Medication Sig Dispense Refill  . ALBUTEROL IN Inhale into the lungs.    . benzonatate (TESSALON) 200 MG capsule Take 1 capsule (200 mg total) by mouth 2 (two) times daily as needed for cough. 30 capsule 1  . budesonide-formoterol (SYMBICORT) 160-4.5 MCG/ACT inhaler Inhale 2 puffs into the lungs 2 (two) times daily. 3 Inhaler 3  . BYSTOLIC 20 MG TABS TAKE 1 TABLET BY MOUTH EVERY DAY 30 tablet 11  . ibuprofen (ADVIL,MOTRIN) 200 MG tablet Take 1,200 mg by mouth daily as needed for moderate pain (tooth pain).    . triamterene-hydrochlorothiazide (MAXZIDE-25) 37.5-25 MG tablet Take 1 tablet by mouth daily. 30 tablet 11  . Vitamin D, Ergocalciferol, (DRISDOL) 1.25 MG (50000 UT) CAPS capsule TAKE ONE CAPSULE BY MOUTH EVERY 7 DAYS 4 capsule 0   No current facility-administered medications on file prior to visit.     PAST MEDICAL HISTORY: Past Medical History:  Diagnosis Date  . Anxiety   . Asthma    prn inhaler  . Back pain   . Bell's palsy   . Carpal tunnel syndrome of left wrist 01/2013  . Chest pain   . Cold hands and feet   . Cough   . Depression   . Dry mouth   . Dry skin   . Fatigue   . GERD (gastroesophageal reflux disease)    occasional; no current med.  Kim Barnes Kitchen History of brain tumor 1996   no neurologic deficits  .  HTN (hypertension)    under control with med., has been on med. x 5 yr.  . Joint pain   . Obesity   . Obstructive sleep apnea   . PCOS (polycystic ovarian syndrome)   . Polycystic ovarian syndrome    no current med.; irregular periods  . Shortness of breath   . Stress   . Swelling of extremity   . Wheezing     PAST SURGICAL HISTORY: Past Surgical History:  Procedure Laterality Date  . BRAIN SURGERY  1996   exc. tumor  . CARPAL TUNNEL RELEASE Left 02/10/2013   Procedure: CARPAL TUNNEL RELEASE;  Surgeon: Nicki Reaper, MD;  Location: [facility];  Service: Orthopedics;  Laterality: Left;  ANESTHESIA: IV REGIONAL FAB  . HYSTEROSCOPY W/D&C  02/21/2006  . HYSTEROSCOPY W/D&C  12/17/2011   Procedure: DILATATION AND CURETTAGE /HYSTEROSCOPY;  Surgeon: Meriel Pica, MD;  Location: [facility];  Service: Gynecology;  Laterality: N/A;  . LEEP  01/07/2004  . TONSILLECTOMY  09/21/2002    SOCIAL HISTORY: Social  History   Tobacco Use  . Smoking status: Never Smoker  . Smokeless tobacco: Never Used  Substance Use Topics  . Alcohol use: Yes    Comment: rare; socially  . Drug use: No    FAMILY HISTORY: Family History  Problem Relation Age of Onset  . Hypertension Other   . Lung cancer Father        Smoker  . Cancer Father        lung/ smoker  . Heart disease Father   . Alcoholism Father   . Breast cancer Maternal Grandmother   . Cancer Maternal Grandmother        breast  . Emphysema Paternal Grandmother   . Asthma Mother   . Hyperlipidemia Mother   . Hypertension Mother   . Sleep apnea Mother   . Asthma Maternal Aunt   . Breast cancer Maternal Aunt        brain   . Breast cancer Paternal Aunt        prostate    ROS: Review of Systems  Respiratory: Negative for shortness of breath.   Cardiovascular: Negative for chest pain.  Endo/Heme/Allergies:       Negative for hypoglycemia.  Psychiatric/Behavioral: Positive for depression.    PHYSICAL EXAM: Pt in no  acute distress  RECENT LABS AND TESTS: BMET    Component Value Date/Time   NA 147 (H) 03/10/2019 0932   K 4.3 03/10/2019 0932   CL 104 03/10/2019 0932   CO2 26 03/10/2019 0932   GLUCOSE 80 03/10/2019 0932   GLUCOSE 103 (H) 12/31/2017 0926   GLUCOSE 83 10/08/2006 1418   BUN 12 03/10/2019 0932   CREATININE 0.79 03/10/2019 0932   CALCIUM 9.7 03/10/2019 0932   GFRNONAA 94 03/10/2019 0932   GFRAA 108 03/10/2019 0932   Lab Results  Component Value Date   HGBA1C 5.8 (H) 03/10/2019   HGBA1C 5.7 (H) 11/11/2018   HGBA1C 5.7 (H) 07/28/2018   HGBA1C 5.8 (H) 02/26/2018   HGBA1C 5.3 03/31/2015   Lab Results  Component Value Date   INSULIN 34.6 (H) 03/10/2019   INSULIN 26.3 (H) 11/11/2018   INSULIN 14.8 07/28/2018   INSULIN 26.4 (H) 02/26/2018   CBC    Component Value Date/Time   WBC 5.4 02/26/2018 0936   WBC 5.7 12/31/2017 0926   RBC 4.74 02/26/2018 0936   RBC 4.82 12/31/2017 0926   HGB 13.6 02/26/2018 0936   HCT 42.2 02/26/2018 0936   PLT 281.0 12/31/2017 0926   MCV 89 02/26/2018 0936   MCH 28.7 02/26/2018 0936   MCH 29.3 02/04/2017 1851   MCHC 32.2 02/26/2018 0936   MCHC 33.8 12/31/2017 0926   RDW 14.7 02/26/2018 0936   LYMPHSABS 2.1 02/26/2018 0936   MONOABS 0.3 12/31/2017 0926   EOSABS 0.1 02/26/2018 0936   BASOSABS 0.0 02/26/2018 0936   Iron/TIBC/Ferritin/ %Sat No results found for: IRON, TIBC, FERRITIN, IRONPCTSAT Lipid Panel     Component Value Date/Time   CHOL 216 (H) 03/10/2019 0932   TRIG 81 03/10/2019 0932   HDL 55 03/10/2019 0932   CHOLHDL 4 12/31/2017 0926   VLDL 17.2 12/31/2017 0926   LDLCALC 145 (H) 03/10/2019 0932   LDLDIRECT 149.4 05/24/2010 0904   Hepatic Function Panel     Component Value Date/Time   PROT 7.6 03/10/2019 0932   ALBUMIN 4.4 03/10/2019 0932   AST 20 03/10/2019 0932   ALT 17 03/10/2019 0932   ALKPHOS 67 03/10/2019 0932   BILITOT 0.4 03/10/2019 0932  BILIDIR 0.0 12/31/2017 0926      Component Value Date/Time   TSH  3.240 03/10/2019 0932   TSH 2.540 02/26/2018 0936   TSH 1.78 12/31/2017 0926    Results for MISHAEL, KRYSIAK (MRN [id number]) as of 04/02/2019 14:57  Ref. Range 03/10/2019 09:32  Vitamin D, 25-Hydroxy Latest Ref Range: 30.0 - 100.0 ng/mL 19.6 (L)    I, Kirke Corin, CMA, am acting as Energy manager for [employer], FNP-C.  I have reviewed the above documentation for accuracy and completeness, and I agree with the above.  - Khair Chasteen, FNP-C.

## 2019-04-16 ENCOUNTER — Other Ambulatory Visit: Payer: Self-pay

## 2019-04-16 ENCOUNTER — Encounter (INDEPENDENT_AMBULATORY_CARE_PROVIDER_SITE_OTHER): Payer: Self-pay | Admitting: Family Medicine

## 2019-04-16 ENCOUNTER — Ambulatory Visit (INDEPENDENT_AMBULATORY_CARE_PROVIDER_SITE_OTHER): Payer: 59 | Admitting: Family Medicine

## 2019-04-16 DIAGNOSIS — E559 Vitamin D deficiency, unspecified: Secondary | ICD-10-CM

## 2019-04-16 DIAGNOSIS — Z6841 Body Mass Index (BMI) 40.0 and over, adult: Secondary | ICD-10-CM

## 2019-04-16 DIAGNOSIS — R7303 Prediabetes: Secondary | ICD-10-CM | POA: Diagnosis not present

## 2019-04-16 NOTE — Progress Notes (Signed)
Office: [phone]  /  Fax: [phone] TeleHealth Visit:  Caro Laroche has verbally consented to this TeleHealth visit today. The patient is located at home, the provider is located at the [facility] office. The participants in this visit include the listed provider and patient. The visit was conducted today via Face Time.  HPI:   Chief Complaint: OBESITY Alfretta is here to discuss her progress with her obesity treatment plan. She is on the Category 2 plan and is following her eating plan approximately 20 % of the time. She states she is exercising 0 minutes 0 times per week. Kellee weighed 373 pounds today. She is skipping meals and she reports no polyphagia.  We were unable to weigh the patient today for this TeleHealth visit. She feels as if she has gained weight since her last visit. She has lost 6 lbs since starting treatment with [name].  Pre-Diabetes Loucille has a diagnosis of pre-diabetes based on her elevated Hgb A1c and was informed this puts her at greater risk of developing diabetes. She is on metformin every morning, but does not always take it. She does not feel that it affects her appetite. She continues to work on diet and exercise to decrease risk of diabetes. She denies polyphagia. Lab Results  Component Value Date   HGBA1C 5.8 (H) 03/10/2019    Vitamin D Deficiency Swati has a diagnosis of vitamin D deficiency. She is currently on OTC vit D 2,000 IU and prescription vit D. Her last vitamin D level was 19.6 on 03/10/19. Gabriela denies fatigue, nausea, vomiting, or muscle weakness.  ASSESSMENT AND PLAN:  Prediabetes  Vitamin D deficiency  Class 3 severe obesity with serious comorbidity and body mass index (BMI) of 60.0 to 69.9 in adult, unspecified obesity type (HCC)  PLAN:  Pre-Diabetes Bonnita will continue to work on weight loss, exercise, and decreasing simple carbohydrates in her diet to help decrease the risk of diabetes. She was informed that  eating too many simple carbohydrates or too many calories at one sitting increases the likelihood of GI side effects. Bettye agreed to continue metformin 500 mg and a prescription was not written today. Iysis agreed to follow up with [name] as directed to monitor her progress in 2 weeks.   Vitamin D Deficiency Keysha was informed that low vitamin D levels contribute to fatigue and are associated with obesity, breast, and colon cancer. Laverle agrees to continue to take prescription Vit D ,000 IU every week and OTC vit D and will follow up for routine testing of vitamin D, at least 2-3 times per year. She was informed of the risk of over-replacement of vitamin D and agrees to not increase her dose unless she discusses this with [name] first. Jla agrees to follow up in 2 weeks as directed.  I spent > than 50% of the 15 minute visit on counseling as documented in the note.  Obesity Diamonds is currently in the action stage of change. As such, her goal is to continue with weight loss efforts. She has agreed to follow the Category 2 plan. [place] has been instructed to work up to a goal of 150 minutes of combined cardio and strengthening exercise per week for weight loss and overall health benefits. We discussed the following Behavioral Modification Strategies today: increasing lean protein intake, no skipping meals, and planning for success.  Daaiyah has agreed to follow up with our clinic in 2 weeks. She was informed of the importance of frequent follow  up visits to maximize her success with intensive lifestyle modifications for her multiple health conditions.  ALLERGIES: No Known Allergies  MEDICATIONS: Current Outpatient Medications on File Prior to Visit  Medication Sig Dispense Refill   ALBUTEROL IN Inhale into the lungs.     amLODipine (NORVASC) 5 MG tablet Take 1 tablet (5 mg total) by mouth daily. 30 tablet 0   benzonatate (TESSALON) 200 MG capsule Take 1 capsule (200 mg total) by mouth 2  (two) times daily as needed for cough. 30 capsule 1   budesonide-formoterol (SYMBICORT) 160-4.5 MCG/ACT inhaler Inhale 2 puffs into the lungs 2 (two) times daily. 3 Inhaler 3   buPROPion (WELLBUTRIN SR) 200 MG 12 hr tablet Take 1 tablet (200 mg total) by mouth daily. 30 tablet 0   BYSTOLIC 20 MG TABS TAKE 1 TABLET BY MOUTH EVERY DAY 30 tablet 11   ibuprofen (ADVIL,MOTRIN) 200 MG tablet Take 1,200 mg by mouth daily as needed for moderate pain (tooth pain).     metFORMIN (GLUCOPHAGE) 500 MG tablet Take 1 tablet (500 mg total) by mouth daily with breakfast. 30 tablet 0   triamterene-hydrochlorothiazide (MAXZIDE-25) 37.5-25 MG tablet Take 1 tablet by mouth daily. 30 tablet 11   Vitamin D, Ergocalciferol, (DRISDOL) 1.25 MG (50000 UT) CAPS capsule TAKE ONE CAPSULE BY MOUTH EVERY 7 DAYS 4 capsule 0   No current facility-administered medications on file prior to visit.     PAST MEDICAL HISTORY: Past Medical History:  Diagnosis Date   Anxiety    Asthma    prn inhaler   Back pain    Bell's palsy    Carpal tunnel syndrome of left wrist 01/2013   Chest pain    Cold hands and feet    Cough    Depression    Dry mouth    Dry skin    Fatigue    GERD (gastroesophageal reflux disease)    occasional; no current med.   History of brain tumor 1996   no neurologic deficits   HTN (hypertension)    under control with med., has been on med. x 5 yr.   Joint pain    Obesity    Obstructive sleep apnea    PCOS (polycystic ovarian syndrome)    Polycystic ovarian syndrome    no current med.; irregular periods   Shortness of breath    Stress    Swelling of extremity    Wheezing     PAST SURGICAL HISTORY: Past Surgical History:  Procedure Laterality Date   BRAIN SURGERY  1996   exc. tumor   CARPAL TUNNEL RELEASE Left 02/10/2013   Procedure: CARPAL TUNNEL RELEASE;  Surgeon: Nicki Reaper, MD;  Location: [facility];  Service: Orthopedics;  Laterality:  Left;  ANESTHESIA: IV REGIONAL FAB   HYSTEROSCOPY W/D&C  02/21/2006   HYSTEROSCOPY W/D&C  12/17/2011   Procedure: DILATATION AND CURETTAGE /HYSTEROSCOPY;  Surgeon: Meriel Pica, MD;  Location: [facility];  Service: Gynecology;  Laterality: N/A;   LEEP  01/07/2004   TONSILLECTOMY  09/21/2002    SOCIAL HISTORY: Social History   Tobacco Use   Smoking status: Never Smoker   Smokeless tobacco: Never Used  Substance Use Topics   Alcohol use: Yes    Comment: rare; socially   Drug use: No    FAMILY HISTORY: Family History  Problem Relation Age of Onset   Hypertension Other    Lung cancer Father        Smoker   Cancer  Father        lung/ smoker   Heart disease Father    Alcoholism Father    Breast cancer Maternal Grandmother    Cancer Maternal Grandmother        breast   Emphysema Paternal Grandmother    Asthma Mother    Hyperlipidemia Mother    Hypertension Mother    Sleep apnea Mother    Asthma Maternal Aunt    Breast cancer Maternal Aunt        brain    Breast cancer Paternal Aunt        prostate    ROS: Review of Systems  Constitutional: Negative for malaise/fatigue.  Gastrointestinal: Negative for nausea and vomiting.  Musculoskeletal:       Negative for muscle weakness.  Endo/Heme/Allergies:       Negative for polyphagia.    PHYSICAL EXAM: Pt in no acute distress  RECENT LABS AND TESTS: BMET    Component Value Date/Time   NA 147 (H) 03/10/2019 0932   K 4.3 03/10/2019 0932   CL 104 03/10/2019 0932   CO2 26 03/10/2019 0932   GLUCOSE 80 03/10/2019 0932   GLUCOSE 103 (H) 12/31/2017 0926   GLUCOSE 83 10/08/2006 1418   BUN 12 03/10/2019 0932   CREATININE 0.79 03/10/2019 0932   CALCIUM 9.7 03/10/2019 0932   GFRNONAA 94 03/10/2019 0932   GFRAA 108 03/10/2019 0932   Lab Results  Component Value Date   HGBA1C 5.8 (H) 03/10/2019   HGBA1C 5.7 (H) 11/11/2018   HGBA1C 5.7 (H) 07/28/2018   HGBA1C 5.8 (H) 02/26/2018   HGBA1C 5.3  03/31/2015   Lab Results  Component Value Date   INSULIN 34.6 (H) 03/10/2019   INSULIN 26.3 (H) 11/11/2018   INSULIN 14.8 07/28/2018   INSULIN 26.4 (H) 02/26/2018   CBC    Component Value Date/Time   WBC 5.4 02/26/2018 0936   WBC 5.7 12/31/2017 0926   RBC 4.74 02/26/2018 0936   RBC 4.82 12/31/2017 0926   HGB 13.6 02/26/2018 0936   HCT 42.2 02/26/2018 0936   PLT 281.0 12/31/2017 0926   MCV 89 02/26/2018 0936   MCH 28.7 02/26/2018 0936   MCH 29.3 02/04/2017 1851   MCHC 32.2 02/26/2018 0936   MCHC 33.8 12/31/2017 0926   RDW 14.7 02/26/2018 0936   LYMPHSABS 2.1 02/26/2018 0936   MONOABS 0.3 12/31/2017 0926   EOSABS 0.1 02/26/2018 0936   BASOSABS 0.0 02/26/2018 0936   Iron/TIBC/Ferritin/ %Sat No results found for: IRON, TIBC, FERRITIN, IRONPCTSAT Lipid Panel     Component Value Date/Time   CHOL 216 (H) 03/10/2019 0932   TRIG 81 03/10/2019 0932   HDL 55 03/10/2019 0932   CHOLHDL 4 12/31/2017 0926   VLDL 17.2 12/31/2017 0926   LDLCALC 145 (H) 03/10/2019 0932   LDLDIRECT 149.4 05/24/2010 0904   Hepatic Function Panel     Component Value Date/Time   PROT 7.6 03/10/2019 0932   ALBUMIN 4.4 03/10/2019 0932   AST 20 03/10/2019 0932   ALT 17 03/10/2019 0932   ALKPHOS 67 03/10/2019 0932   BILITOT 0.4 03/10/2019 0932   BILIDIR 0.0 12/31/2017 0926      Component Value Date/Time   TSH 3.240 03/10/2019 0932   TSH 2.540 02/26/2018 0936   TSH 1.78 12/31/2017 0926    Results for Tejera, Tanairi L (MRN [id number]) as of 04/16/2019 13:06  Ref. Range 03/10/2019 09:32  Vitamin D, 25-Hydroxy Latest Ref Range: 30.0 - 100.0 ng/mL 19.6 (L)  Launa Flight, CMA, am acting as Energy manager for [employer], FNP-C.  I have reviewed the above documentation for accuracy and completeness, and I agree with the above.  - Iverna Hammac, FNP-C.

## 2019-04-20 ENCOUNTER — Encounter (INDEPENDENT_AMBULATORY_CARE_PROVIDER_SITE_OTHER): Payer: Self-pay | Admitting: Family Medicine

## 2019-04-30 ENCOUNTER — Ambulatory Visit (INDEPENDENT_AMBULATORY_CARE_PROVIDER_SITE_OTHER): Payer: 59 | Admitting: Family Medicine

## 2019-04-30 ENCOUNTER — Other Ambulatory Visit: Payer: Self-pay

## 2019-04-30 ENCOUNTER — Encounter (INDEPENDENT_AMBULATORY_CARE_PROVIDER_SITE_OTHER): Payer: Self-pay | Admitting: Family Medicine

## 2019-04-30 DIAGNOSIS — Z6841 Body Mass Index (BMI) 40.0 and over, adult: Secondary | ICD-10-CM | POA: Diagnosis not present

## 2019-04-30 DIAGNOSIS — F3289 Other specified depressive episodes: Secondary | ICD-10-CM

## 2019-04-30 NOTE — Progress Notes (Signed)
Office: [phone]  /  Fax: [phone] TeleHealth Visit:  Kim Barnes has verbally consented to this TeleHealth visit today. The patient is located at home, the provider is located at the [facility] office. The participants in this visit include the listed provider and patient. The visit was conducted today via Webex.  HPI:   Chief Complaint: OBESITY Kim Barnes is here to discuss her progress with her obesity treatment plan. She is on the Category 2 plan and is following her eating plan approximately 0% of the time. She states she is exercising 0 minutes 0 times per week. Kim Barnes states her weight today is 376.4 reflecting a 3 lb weight gain. She has been off the plan completely. She feels she is motivated to lose weight but is eating from boredom. She has healthy food available but has been eating out much more frequently. Her boyfriend is very supportive of her weight loss efforts. She is not skipping meals. We were unable to weigh the patient today for this TeleHealth visit. She states her weight today is 376.4 lbs. She has lost 6 lbs since starting treatment with [name].  Depression with emotional eating behaviors Kim Barnes has increased boredom eating recently and overall feels less motivated to stick to the plan. She is on bupropion currently. Her boyfriend  has been asking her to walk with him. She shows no sign of suicidal or homicidal ideations.  Depression screen PHQ 2/9 02/26/2018  Decreased Interest 2  Down, Depressed, Hopeless 1  PHQ - 2 Score 3  Altered sleeping 2  Tired, decreased energy 2  Change in appetite 2  Feeling bad or failure about yourself  2  Trouble concentrating 1  Moving slowly or fidgety/restless 1  Suicidal thoughts 0  PHQ-9 Score 13  Difficult doing work/chores Somewhat difficult   ASSESSMENT AND PLAN:  Other depression  Class 3 severe obesity with serious comorbidity and body mass index (BMI) of 60.0 to 69.9 in adult, unspecified obesity  type (HCC)  PLAN:  Depression with Emotional Eating Behaviors We discussed behavior modification techniques today to help Kim Barnes deal with her emotional eating and depression. Zarai will walk two times per week with her boyfriend. She will continue bupropion and will follow-up as directed.  I spent > than 50% of the 15 minute visit on counseling as documented in the note.  Obesity Kim Barnes is currently in the action stage of change. As such, her goal is to continue with weight loss efforts. She has agreed to follow the Category 2 plan. Kim Barnes has been instructed to walk with her boyfriend 2 days a week for weight loss and overall health benefits. We discussed the following Behavioral Modification Strategies today: decrease eating out, keeping healthy foods in the home, better snacking choices, emotional eating strategies, avoiding temptations, and planning for success.  Kim Barnes has agreed to follow-up with our clinic in 2 weeks. She was informed of the importance of frequent follow-up visits to maximize her success with intensive lifestyle modifications for her multiple health conditions.  ALLERGIES: No Known Allergies  MEDICATIONS: Current Outpatient Medications on File Prior to Visit  Medication Sig Dispense Refill  . ALBUTEROL IN Inhale into the lungs.    [name] amLODipine (NORVASC) 5 MG tablet Take 1 tablet (5 mg total) by mouth daily. 30 tablet 0  . benzonatate (TESSALON) 200 MG capsule Take 1 capsule (200 mg total) by mouth 2 (two) times daily as needed for cough. 30 capsule 1  . budesonide-formoterol (SYMBICORT) 160-4.5 MCG/ACT  inhaler Inhale 2 puffs into the lungs 2 (two) times daily. 3 Inhaler 3  . buPROPion (WELLBUTRIN SR) 200 MG 12 hr tablet Take 1 tablet (200 mg total) by mouth daily. 30 tablet 0  . BYSTOLIC 20 MG TABS TAKE 1 TABLET BY MOUTH EVERY DAY 30 tablet 11  . ibuprofen (ADVIL,MOTRIN) 200 MG tablet Take 1,200 mg by mouth daily as needed for moderate pain (tooth pain).    .  metFORMIN (GLUCOPHAGE) 500 MG tablet Take 1 tablet (500 mg total) by mouth daily with breakfast. 30 tablet 0  . triamterene-hydrochlorothiazide (MAXZIDE-25) 37.5-25 MG tablet Take 1 tablet by mouth daily. 30 tablet 11  . Vitamin D, Ergocalciferol, (DRISDOL) 1.25 MG (50000 UT) CAPS capsule TAKE ONE CAPSULE BY MOUTH EVERY 7 DAYS 4 capsule 0   No current facility-administered medications on file prior to visit.     PAST MEDICAL HISTORY: Past Medical History:  Diagnosis Date  . Anxiety   . Asthma    prn inhaler  . Back pain   . Bell's palsy   . Carpal tunnel syndrome of left wrist 01/2013  . Chest pain   . Cold hands and feet   . Cough   . Depression   . Dry mouth   . Dry skin   . Fatigue   . GERD (gastroesophageal reflux disease)    occasional; no current med.  [name]. History of brain tumor 1996   no neurologic deficits  . HTN (hypertension)    under control with med., has been on med. x 5 yr.  . Joint pain   . Obesity   . Obstructive sleep apnea   . PCOS (polycystic ovarian syndrome)   . Polycystic ovarian syndrome    no current med.; irregular periods  . Shortness of breath   . Stress   . Swelling of extremity   . Wheezing     PAST SURGICAL HISTORY: Past Surgical History:  Procedure Laterality Date  . BRAIN SURGERY  1996   exc. tumor  . CARPAL TUNNEL RELEASE Left 02/10/2013   Procedure: CARPAL TUNNEL RELEASE;  Surgeon: Nicki ReaperGary R Kuzma, MD;  Location: [facility];  Service: Orthopedics;  Laterality: Left;  ANESTHESIA: IV REGIONAL FAB  . HYSTEROSCOPY W/D&C  02/21/2006  . HYSTEROSCOPY W/D&C  12/17/2011   Procedure: DILATATION AND CURETTAGE /HYSTEROSCOPY;  Surgeon: Meriel Picaichard M Holland, MD;  Location: [facility];  Service: Gynecology;  Laterality: N/A;  . LEEP  01/07/2004  . TONSILLECTOMY  09/21/2002    SOCIAL HISTORY: Social History   Tobacco Use  . Smoking status: Never Smoker  . Smokeless tobacco: Never Used  Substance Use Topics  . Alcohol use: Yes    Comment:  rare; socially  . Drug use: No    FAMILY HISTORY: Family History  Problem Relation Age of Onset  . Hypertension Other   . Lung cancer Father        Smoker  . Cancer Father        lung/ smoker  . Heart disease Father   . Alcoholism Father   . Breast cancer Maternal Grandmother   . Cancer Maternal Grandmother        breast  . Emphysema Paternal Grandmother   . Asthma Mother   . Hyperlipidemia Mother   . Hypertension Mother   . Sleep apnea Mother   . Asthma Maternal Aunt   . Breast cancer Maternal Aunt        brain   . Breast cancer Paternal Aunt  prostate   ROS: Review of Systems  Psychiatric/Behavioral: Positive for depression (emotional eating). Negative for suicidal ideas.       Negative for homicidal ideas.   PHYSICAL EXAM: Pt in no acute distress  RECENT LABS AND TESTS: BMET    Component Value Date/Time   NA 147 (H) 03/10/2019 0932   K 4.3 03/10/2019 0932   CL 104 03/10/2019 0932   CO2 26 03/10/2019 0932   GLUCOSE 80 03/10/2019 0932   GLUCOSE 103 (H) 12/31/2017 0926   GLUCOSE 83 10/08/2006 1418   BUN 12 03/10/2019 0932   CREATININE 0.79 03/10/2019 0932   CALCIUM 9.7 03/10/2019 0932   GFRNONAA 94 03/10/2019 0932   GFRAA 108 03/10/2019 0932   Lab Results  Component Value Date   HGBA1C 5.8 (H) 03/10/2019   HGBA1C 5.7 (H) 11/11/2018   HGBA1C 5.7 (H) 07/28/2018   HGBA1C 5.8 (H) 02/26/2018   HGBA1C 5.3 03/31/2015   Lab Results  Component Value Date   INSULIN 34.6 (H) 03/10/2019   INSULIN 26.3 (H) 11/11/2018   INSULIN 14.8 07/28/2018   INSULIN 26.4 (H) 02/26/2018   CBC    Component Value Date/Time   WBC 5.4 02/26/2018 0936   WBC 5.7 12/31/2017 0926   RBC 4.74 02/26/2018 0936   RBC 4.82 12/31/2017 0926   HGB 13.6 02/26/2018 0936   HCT 42.2 02/26/2018 0936   PLT 281.0 12/31/2017 0926   MCV 89 02/26/2018 0936   MCH 28.7 02/26/2018 0936   MCH 29.3 02/04/2017 1851   MCHC 32.2 02/26/2018 0936   MCHC 33.8 12/31/2017 0926   RDW 14.7  02/26/2018 0936   LYMPHSABS 2.1 02/26/2018 0936   MONOABS 0.3 12/31/2017 0926   EOSABS 0.1 02/26/2018 0936   BASOSABS 0.0 02/26/2018 0936   Iron/TIBC/Ferritin/ %Sat No results found for: IRON, TIBC, FERRITIN, IRONPCTSAT Lipid Panel     Component Value Date/Time   CHOL 216 (H) 03/10/2019 0932   TRIG 81 03/10/2019 0932   HDL 55 03/10/2019 0932   CHOLHDL 4 12/31/2017 0926   VLDL 17.2 12/31/2017 0926   LDLCALC 145 (H) 03/10/2019 0932   LDLDIRECT 149.4 05/24/2010 0904   Hepatic Function Panel     Component Value Date/Time   PROT 7.6 03/10/2019 0932   ALBUMIN 4.4 03/10/2019 0932   AST 20 03/10/2019 0932   ALT 17 03/10/2019 0932   ALKPHOS 67 03/10/2019 0932   BILITOT 0.4 03/10/2019 0932   BILIDIR 0.0 12/31/2017 0926      Component Value Date/Time   TSH 3.240 03/10/2019 0932   TSH 2.540 02/26/2018 0936   TSH 1.78 12/31/2017 0926   Results for Gervais, [name] L (MRN [id number]) as of 04/30/2019 13:42  Ref. Range 03/10/2019 09:32  Vitamin D, 25-Hydroxy Latest Ref Range: 30.0 - 100.0 ng/mL 19.6 (L)    I, Marianna Payment, am acting as Energy manager for [place], FNP-C.  I have reviewed the above documentation for accuracy and completeness, and I agree with the above.  - Ricca Melgarejo, FNP-C.

## 2019-05-05 ENCOUNTER — Encounter (INDEPENDENT_AMBULATORY_CARE_PROVIDER_SITE_OTHER): Payer: Self-pay | Admitting: Family Medicine

## 2019-05-06 ENCOUNTER — Other Ambulatory Visit: Payer: Self-pay | Admitting: Internal Medicine

## 2019-05-06 MED ORDER — ALPRAZOLAM 0.5 MG PO TBDP: 0.5000 mg | ORAL_TABLET | Freq: Two times a day (BID) | ORAL | 0 refills | Status: DC | PRN

## 2019-05-12 ENCOUNTER — Encounter (INDEPENDENT_AMBULATORY_CARE_PROVIDER_SITE_OTHER): Payer: Self-pay | Admitting: Family Medicine

## 2019-05-12 ENCOUNTER — Other Ambulatory Visit: Payer: Self-pay

## 2019-05-12 ENCOUNTER — Ambulatory Visit (INDEPENDENT_AMBULATORY_CARE_PROVIDER_SITE_OTHER): Payer: 59 | Admitting: Family Medicine

## 2019-05-12 DIAGNOSIS — E559 Vitamin D deficiency, unspecified: Secondary | ICD-10-CM | POA: Diagnosis not present

## 2019-05-12 DIAGNOSIS — I10 Essential (primary) hypertension: Secondary | ICD-10-CM

## 2019-05-12 DIAGNOSIS — Z6841 Body Mass Index (BMI) 40.0 and over, adult: Secondary | ICD-10-CM

## 2019-05-12 MED ORDER — VITAMIN D (ERGOCALCIFEROL) 1.25 MG (50000 UNIT) PO CAPS: ORAL_CAPSULE | ORAL | 0 refills | Status: DC

## 2019-05-12 MED ORDER — AMLODIPINE BESYLATE 5 MG PO TABS: 5.0000 mg | ORAL_TABLET | Freq: Every day | ORAL | 0 refills | Status: DC

## 2019-05-12 NOTE — Progress Notes (Signed)
Office: [phone]  /  Fax: [phone] TeleHealth Visit:  Kim Barnes has verbally consented to this TeleHealth visit today. The patient is located at home, the provider is located at the [facility] office. The participants in this visit include the listed provider and patient. The visit was conducted today via Webex.  HPI:   Chief Complaint: OBESITY Leeba is here to discuss her progress with her obesity treatment plan. She is on the Category 2 plan and is following her eating plan approximately 30% of the time. She states she is walking 30 minutes 2 times per week. Cookie states she is eating out less and is cooking more. She has been walking with her boyfriend. She feels she needs to increase her vegetables. She reports snacking on popcorn, eating some extra sweets and drinking sodas. We were unable to weigh the patient today for this TeleHealth visit. She reports her weight at home today to be 377 lbs. She has lost 6 lbs since starting treatment with [name].  Hypertension Kinslie L Mihalic is a 43 y.o. female with hypertension, which is moderately well controlled at her most recent office visits.  Jendaya L Wahlen denies chest pain or shortness of breath on exertion. She is working weight loss to help control her blood pressure with the goal of decreasing her risk of heart attack and stroke. Kenedy reports not checking her blood pressure at home. BP Readings from Last 3 Encounters:  02/10/19 130/72  01/27/19 (!) 149/84  01/12/19 (!) 146/88    Vitamin D deficiency Korianna has a diagnosis of Vitamin D deficiency, which is not at goal. Her last Vitamin D level was reported to be 19.6 on 03/10/2019. She is currently taking prescription Vit D and denies nausea, vomiting or muscle weakness.  ASSESSMENT AND PLAN:  Essential hypertension - Plan: amLODipine (NORVASC) 5 MG tablet  Vitamin D deficiency - Plan: Vitamin D, Ergocalciferol, (DRISDOL) 1.25 MG (50000 UT) CAPS capsule   Class 3 severe obesity with serious comorbidity and body mass index (BMI) of 60.0 to 69.9 in adult, unspecified obesity type (HCC)  PLAN:  Hypertension We discussed sodium restriction, working on healthy weight loss, and a regular exercise program as the means to achieve improved blood pressure control. Jenavee agreed with this plan and agreed to follow up as directed. We will continue to monitor her blood pressure as well as her progress with the above lifestyle modifications. Destyni was given a refill on her amlodipine 5 mg QD #30 with 0 refills and she agrees to follow-up with our clinic in 2 weeks. She plans to buy a blood pressure cuff this weekend. She will watch for signs of hypotension as she continues her lifestyle modifications.  Vitamin D Deficiency Xee was informed that low Vitamin D levels contributes to fatigue and are associated with obesity, breast, and colon cancer. She agrees to continue to take prescription Vit D @ 50,000 IU every week #4 with 0 refills and will follow-up for routine testing of Vitamin D, at least 2-3 times per year. She was informed of the risk of over-replacement of Vitamin D and agrees to not increase her dose unless she discusses this with [name] first. Analeise agrees to follow-up with our clinic in 2 weeks.  Obesity Ginevra is currently in the action stage of change. As such, her goal is to continue with weight loss efforts. She has agreed to follow the Category 2 plan. Cienna has been instructed to continue her current exercise regimen for  weight loss and overall health benefits. We discussed the following Behavioral Modification Strategies today: decreasing simple carbohydrates, increasing vegetables, decrease liquid calories, better snacking choices, and planning for success.  Labresha has agreed to follow-up with our clinic in 2 weeks. She was informed of the importance of frequent follow-up visits to maximize her success with intensive lifestyle modifications  for her multiple health conditions.  ALLERGIES: No Known Allergies  MEDICATIONS: Current Outpatient Medications on File Prior to Visit  Medication Sig Dispense Refill  . ALBUTEROL IN Inhale into the lungs.    . ALPRAZolam (NIRAVAM) 0.5 MG dissolvable tablet Take 1 tablet (0.5 mg total) by mouth 2 (two) times daily as needed for anxiety. 10 tablet 0  . benzonatate (TESSALON) 200 MG capsule Take 1 capsule (200 mg total) by mouth 2 (two) times daily as needed for cough. 30 capsule 1  . budesonide-formoterol (SYMBICORT) 160-4.5 MCG/ACT inhaler Inhale 2 puffs into the lungs 2 (two) times daily. 3 Inhaler 3  . buPROPion (WELLBUTRIN SR) 200 MG 12 hr tablet Take 1 tablet (200 mg total) by mouth daily. 30 tablet 0  . BYSTOLIC 20 MG TABS TAKE 1 TABLET BY MOUTH EVERY DAY 30 tablet 11  . ibuprofen (ADVIL,MOTRIN) 200 MG tablet Take 1,200 mg by mouth daily as needed for moderate pain (tooth pain).    . metFORMIN (GLUCOPHAGE) 500 MG tablet Take 1 tablet (500 mg total) by mouth daily with breakfast. 30 tablet 0  . triamterene-hydrochlorothiazide (MAXZIDE-25) 37.5-25 MG tablet Take 1 tablet by mouth daily. 30 tablet 11   No current facility-administered medications on file prior to visit.     PAST MEDICAL HISTORY: Past Medical History:  Diagnosis Date  . Anxiety   . Asthma    prn inhaler  . Back pain   . Bell's palsy   . Carpal tunnel syndrome of left wrist 01/2013  . Chest pain   . Cold hands and feet   . Cough   . Depression   . Dry mouth   . Dry skin   . Fatigue   . GERD (gastroesophageal reflux disease)    occasional; no current med.  [name] History of brain tumor 1996   no neurologic deficits  . HTN (hypertension)    under control with med., has been on med. x 5 yr.  . Joint pain   . Obesity   . Obstructive sleep apnea   . PCOS (polycystic ovarian syndrome)   . Polycystic ovarian syndrome    no current med.; irregular periods  . Shortness of breath   . Stress   . Swelling of  extremity   . Wheezing     PAST SURGICAL HISTORY: Past Surgical History:  Procedure Laterality Date  . BRAIN SURGERY  1996   exc. tumor  . CARPAL TUNNEL RELEASE Left 02/10/2013   Procedure: CARPAL TUNNEL RELEASE;  Surgeon: Nicki Reaper, MD;  Location: [facility];  Service: Orthopedics;  Laterality: Left;  ANESTHESIA: IV REGIONAL FAB  . HYSTEROSCOPY W/D&C  02/21/2006  . HYSTEROSCOPY W/D&C  12/17/2011   Procedure: DILATATION AND CURETTAGE /HYSTEROSCOPY;  Surgeon: Meriel Pica, MD;  Location: [facility];  Service: Gynecology;  Laterality: N/A;  . LEEP  01/07/2004  . TONSILLECTOMY  09/21/2002    SOCIAL HISTORY: Social History   Tobacco Use  . Smoking status: Never Smoker  . Smokeless tobacco: Never Used  Substance Use Topics  . Alcohol use: Yes    Comment: rare; socially  . Drug use: No  FAMILY HISTORY: Family History  Problem Relation Age of Onset  . Hypertension Other   . Lung cancer Father        Smoker  . Cancer Father        lung/ smoker  . Heart disease Father   . Alcoholism Father   . Breast cancer Maternal Grandmother   . Cancer Maternal Grandmother        breast  . Emphysema Paternal Grandmother   . Asthma Mother   . Hyperlipidemia Mother   . Hypertension Mother   . Sleep apnea Mother   . Asthma Maternal Aunt   . Breast cancer Maternal Aunt        brain   . Breast cancer Paternal Aunt        prostate   ROS: Review of Systems  Respiratory: Negative for shortness of breath.   Cardiovascular: Negative for chest pain.  Gastrointestinal: Negative for nausea and vomiting.  Musculoskeletal:       Negative for muscle weakness.   PHYSICAL EXAM: Pt in no acute distress  RECENT LABS AND TESTS: BMET    Component Value Date/Time   NA 147 (H) 03/10/2019 0932   K 4.3 03/10/2019 0932   CL 104 03/10/2019 0932   CO2 26 03/10/2019 0932   GLUCOSE 80 03/10/2019 0932   GLUCOSE 103 (H) 12/31/2017 0926   GLUCOSE 83 10/08/2006 1418   BUN 12  03/10/2019 0932   CREATININE 0.79 03/10/2019 0932   CALCIUM 9.7 03/10/2019 0932   GFRNONAA 94 03/10/2019 0932   GFRAA 108 03/10/2019 0932   Lab Results  Component Value Date   HGBA1C 5.8 (H) 03/10/2019   HGBA1C 5.7 (H) 11/11/2018   HGBA1C 5.7 (H) 07/28/2018   HGBA1C 5.8 (H) 02/26/2018   HGBA1C 5.3 03/31/2015   Lab Results  Component Value Date   INSULIN 34.6 (H) 03/10/2019   INSULIN 26.3 (H) 11/11/2018   INSULIN 14.8 07/28/2018   INSULIN 26.4 (H) 02/26/2018   CBC    Component Value Date/Time   WBC 5.4 02/26/2018 0936   WBC 5.7 12/31/2017 0926   RBC 4.74 02/26/2018 0936   RBC 4.82 12/31/2017 0926   HGB 13.6 02/26/2018 0936   HCT 42.2 02/26/2018 0936   PLT 281.0 12/31/2017 0926   MCV 89 02/26/2018 0936   MCH 28.7 02/26/2018 0936   MCH 29.3 02/04/2017 1851   MCHC 32.2 02/26/2018 0936   MCHC 33.8 12/31/2017 0926   RDW 14.7 02/26/2018 0936   LYMPHSABS 2.1 02/26/2018 0936   MONOABS 0.3 12/31/2017 0926   EOSABS 0.1 02/26/2018 0936   BASOSABS 0.0 02/26/2018 0936   Iron/TIBC/Ferritin/ %Sat No results found for: IRON, TIBC, FERRITIN, IRONPCTSAT Lipid Panel     Component Value Date/Time   CHOL 216 (H) 03/10/2019 0932   TRIG 81 03/10/2019 0932   HDL 55 03/10/2019 0932   CHOLHDL 4 12/31/2017 0926   VLDL 17.2 12/31/2017 0926   LDLCALC 145 (H) 03/10/2019 0932   LDLDIRECT 149.4 05/24/2010 0904   Hepatic Function Panel     Component Value Date/Time   PROT 7.6 03/10/2019 0932   ALBUMIN 4.4 03/10/2019 0932   AST 20 03/10/2019 0932   ALT 17 03/10/2019 0932   ALKPHOS 67 03/10/2019 0932   BILITOT 0.4 03/10/2019 0932   BILIDIR 0.0 12/31/2017 0926      Component Value Date/Time   TSH 3.240 03/10/2019 0932   TSH 2.540 02/26/2018 0936   TSH 1.78 12/31/2017 0926   Results for Alvarez, Yailine L (  MRN [id number]) as of 05/12/2019 15:48  Ref. Range 03/10/2019 09:32  Vitamin D, 25-Hydroxy Latest Ref Range: 30.0 - 100.0 ng/mL 19.6 (L)   I, Marianna Paymentenise Haag, am acting as  Energy managertranscriptionist for [place]wn Login [place], FNP-C.  I have reviewed the above documentation for accuracy and completeness, and I agree with the above.  - Jesiel Garate, FNP-C.

## 2019-05-13 ENCOUNTER — Encounter (INDEPENDENT_AMBULATORY_CARE_PROVIDER_SITE_OTHER): Payer: Self-pay | Admitting: Family Medicine

## 2019-05-25 ENCOUNTER — Other Ambulatory Visit: Payer: Self-pay

## 2019-05-25 ENCOUNTER — Ambulatory Visit (INDEPENDENT_AMBULATORY_CARE_PROVIDER_SITE_OTHER): Payer: 59 | Admitting: Family Medicine

## 2019-05-25 ENCOUNTER — Encounter (INDEPENDENT_AMBULATORY_CARE_PROVIDER_SITE_OTHER): Payer: Self-pay | Admitting: Family Medicine

## 2019-05-25 DIAGNOSIS — E66813 Obesity, class 3: Secondary | ICD-10-CM

## 2019-05-25 DIAGNOSIS — G4733 Obstructive sleep apnea (adult) (pediatric): Secondary | ICD-10-CM

## 2019-05-25 DIAGNOSIS — Z6841 Body Mass Index (BMI) 40.0 and over, adult: Secondary | ICD-10-CM | POA: Diagnosis not present

## 2019-05-26 NOTE — Progress Notes (Signed)
Office: [phone]  /  Fax: [phone] TeleHealth Visit:  Kim Barnes has verbally consented to this TeleHealth visit today. The patient is located at home, the provider is located at the [facility] office. The participants in this visit include the listed provider and patient. The visit was conducted today via webex.  HPI:   Chief Complaint: OBESITY Kim Barnes is here to discuss her progress with her obesity treatment plan. She is on the Category 2 plan and is following her eating plan approximately 30 % of the time. She states she is exercising 0 minutes 0 times per week. Kim Barnes has gained weight, she is up 1 lb. She is having off the plan meals such as spaghetti. She is drinking SSBS (tea and soda). She is unable to stay motivated and is unsure why she lacks motivation.  We were unable to weigh the patient today for this TeleHealth visit. She feels as if she has gained 1 lb since her last visit. She has lost 6 lbs since starting treatment with [name].  Obstructive Sleep Apnea Kim Barnes has a diagnosis of obstructive sleep apnea. She is not using CPAP. She states she not used her CPAP in >1 year.  ASSESSMENT AND PLAN:  OSA (obstructive sleep apnea)  Class 3 severe obesity with serious comorbidity and body mass index (BMI) of 60.0 to 69.9 in adult, unspecified obesity type (Kim Barnes)  PLAN:  Obstructive Sleep Apnea Kim Barnes will recommit to using her CPAP, and she agrees to follow up with our clinic in 2 weeks.  I spent > than 50% of the 15 minute visit on counseling as documented in the note.  Obesity Kim Barnes is currently in the action stage of change. As such, her goal is to continue with weight loss efforts She has agreed to follow the Category 2 plan  Kim Barnes is to stick to the plan 5 days a week, and cut out SSBS. We discussed reasons to rededicate to plan. Kim Barnes has been instructed to  will walk at least 2 times per week for weight loss and overall health benefits. We  discussed health reasons for weight loss to assist with her motivation.  We discussed the following Behavioral Modification Strategies today: increasing lean protein intake, decrease liquid calories, and planning for success   Kim Barnes has agreed to follow up with our clinic in 2 weeks. She was informed of the importance of frequent follow up visits to maximize her success with intensive lifestyle modifications for her multiple health conditions.  ALLERGIES: No Known Allergies  MEDICATIONS: Current Outpatient Medications on File Prior to Visit  Medication Sig Dispense Refill  . ALBUTEROL IN Inhale into the lungs.    . ALPRAZolam (NIRAVAM) 0.5 MG dissolvable tablet Take 1 tablet (0.5 mg total) by mouth 2 (two) times daily as needed for anxiety. 10 tablet 0  . amLODipine (NORVASC) 5 MG tablet Take 1 tablet (5 mg total) by mouth daily. 30 tablet 0  . benzonatate (TESSALON) 200 MG capsule Take 1 capsule (200 mg total) by mouth 2 (two) times daily as needed for cough. 30 capsule 1  . budesonide-formoterol (SYMBICORT) 160-4.5 MCG/ACT inhaler Inhale 2 puffs into the lungs 2 (two) times daily. 3 Inhaler 3  . buPROPion (WELLBUTRIN SR) 200 MG 12 hr tablet Take 1 tablet (200 mg total) by mouth daily. 30 tablet 0  . BYSTOLIC 20 MG TABS TAKE 1 TABLET BY MOUTH EVERY DAY 30 tablet 11  . ibuprofen (ADVIL,MOTRIN) 200 MG tablet Take 1,200 mg by  mouth daily as needed for moderate pain (tooth pain).    . metFORMIN (GLUCOPHAGE) 500 MG tablet Take 1 tablet (500 mg total) by mouth daily with breakfast. 30 tablet 0  . triamterene-hydrochlorothiazide (MAXZIDE-25) 37.5-25 MG tablet Take 1 tablet by mouth daily. 30 tablet 11  . Vitamin D, Ergocalciferol, (DRISDOL) 1.25 MG (50000 UT) CAPS capsule TAKE ONE CAPSULE BY MOUTH EVERY 7 DAYS 4 capsule 0   No current facility-administered medications on file prior to visit.     PAST MEDICAL HISTORY: Past Medical History:  Diagnosis Date  . Anxiety   . Asthma    prn  inhaler  . Back pain   . Bell's palsy   . Carpal tunnel syndrome of left wrist 01/2013  . Chest pain   . Cold hands and feet   . Cough   . Depression   . Dry mouth   . Dry skin   . Fatigue   . GERD (gastroesophageal reflux disease)    occasional; no current med.  [name]. History of brain tumor 1996   no neurologic deficits  . HTN (hypertension)    under control with med., has been on med. x 5 yr.  . Joint pain   . Obesity   . Obstructive sleep apnea   . PCOS (polycystic ovarian syndrome)   . Polycystic ovarian syndrome    no current med.; irregular periods  . Shortness of breath   . Stress   . Swelling of extremity   . Wheezing     PAST SURGICAL HISTORY: Past Surgical History:  Procedure Laterality Date  . BRAIN SURGERY  1996   exc. tumor  . CARPAL TUNNEL RELEASE Left 02/10/2013   Procedure: CARPAL TUNNEL RELEASE;  Surgeon: Nicki ReaperGary R Kuzma, MD;  Location: [facility];  Service: Orthopedics;  Laterality: Left;  ANESTHESIA: IV REGIONAL FAB  . HYSTEROSCOPY W/D&C  02/21/2006  . HYSTEROSCOPY W/D&C  12/17/2011   Procedure: DILATATION AND CURETTAGE /HYSTEROSCOPY;  Surgeon: Meriel Picaichard M Holland, MD;  Location: [facility];  Service: Gynecology;  Laterality: N/A;  . LEEP  01/07/2004  . TONSILLECTOMY  09/21/2002    SOCIAL HISTORY: Social History   Tobacco Use  . Smoking status: Never Smoker  . Smokeless tobacco: Never Used  Substance Use Topics  . Alcohol use: Yes    Comment: rare; socially  . Drug use: No    FAMILY HISTORY: Family History  Problem Relation Age of Onset  . Hypertension Other   . Lung cancer Father        Smoker  . Cancer Father        lung/ smoker  . Heart disease Father   . Alcoholism Father   . Breast cancer Maternal Grandmother   . Cancer Maternal Grandmother        breast  . Emphysema Paternal Grandmother   . Asthma Mother   . Hyperlipidemia Mother   . Hypertension Mother   . Sleep apnea Mother   . Asthma Maternal Aunt   . Breast cancer  Maternal Aunt        brain   . Breast cancer Paternal Aunt        prostate    ROS: Review of Systems  Constitutional: Negative for weight loss.    PHYSICAL EXAM: Pt in no acute distress  RECENT LABS AND TESTS: BMET    Component Value Date/Time   NA 147 (H) 03/10/2019 0932   K 4.3 03/10/2019 0932   CL 104 03/10/2019 0932  CO2 26 03/10/2019 0932   GLUCOSE 80 03/10/2019 0932   GLUCOSE 103 (H) 12/31/2017 0926   GLUCOSE 83 10/08/2006 1418   BUN 12 03/10/2019 0932   CREATININE 0.79 03/10/2019 0932   CALCIUM 9.7 03/10/2019 0932   GFRNONAA 94 03/10/2019 0932   GFRAA 108 03/10/2019 0932   Lab Results  Component Value Date   HGBA1C 5.8 (H) 03/10/2019   HGBA1C 5.7 (H) 11/11/2018   HGBA1C 5.7 (H) 07/28/2018   HGBA1C 5.8 (H) 02/26/2018   HGBA1C 5.3 03/31/2015   Lab Results  Component Value Date   INSULIN 34.6 (H) 03/10/2019   INSULIN 26.3 (H) 11/11/2018   INSULIN 14.8 07/28/2018   INSULIN 26.4 (H) 02/26/2018   CBC    Component Value Date/Time   WBC 5.4 02/26/2018 0936   WBC 5.7 12/31/2017 0926   RBC 4.74 02/26/2018 0936   RBC 4.82 12/31/2017 0926   HGB 13.6 02/26/2018 0936   HCT 42.2 02/26/2018 0936   PLT 281.0 12/31/2017 0926   MCV 89 02/26/2018 0936   MCH 28.7 02/26/2018 0936   MCH 29.3 02/04/2017 1851   MCHC 32.2 02/26/2018 0936   MCHC 33.8 12/31/2017 0926   RDW 14.7 02/26/2018 0936   LYMPHSABS 2.1 02/26/2018 0936   MONOABS 0.3 12/31/2017 0926   EOSABS 0.1 02/26/2018 0936   BASOSABS 0.0 02/26/2018 0936   Iron/TIBC/Ferritin/ %Sat No results found for: IRON, TIBC, FERRITIN, IRONPCTSAT Lipid Panel     Component Value Date/Time   CHOL 216 (H) 03/10/2019 0932   TRIG 81 03/10/2019 0932   HDL 55 03/10/2019 0932   CHOLHDL 4 12/31/2017 0926   VLDL 17.2 12/31/2017 0926   LDLCALC 145 (H) 03/10/2019 0932   LDLDIRECT 149.4 05/24/2010 0904   Hepatic Function Panel     Component Value Date/Time   PROT 7.6 03/10/2019 0932   ALBUMIN 4.4 03/10/2019 0932   AST  20 03/10/2019 0932   ALT 17 03/10/2019 0932   ALKPHOS 67 03/10/2019 0932   BILITOT 0.4 03/10/2019 0932   BILIDIR 0.0 12/31/2017 0926      Component Value Date/Time   TSH 3.240 03/10/2019 0932   TSH 2.540 02/26/2018 0936   TSH 1.78 12/31/2017 0926      I, Burt KnackSharon Martin, am acting as transcriptionist for AshlandDawn Demeisha Geraghty, FNP-C  I have reviewed the above documentation for accuracy and completeness, and I agree with the above.  - Jadi Deyarmin, FNP-C.

## 2019-06-01 ENCOUNTER — Encounter (INDEPENDENT_AMBULATORY_CARE_PROVIDER_SITE_OTHER): Payer: Self-pay | Admitting: Family Medicine

## 2019-06-03 ENCOUNTER — Other Ambulatory Visit: Payer: Self-pay | Admitting: Internal Medicine

## 2019-06-03 MED ORDER — ALBUTEROL SULFATE HFA 108 (90 BASE) MCG/ACT IN AERS: 2.0000 | INHALATION_SPRAY | RESPIRATORY_TRACT | 3 refills | Status: DC | PRN

## 2019-06-09 ENCOUNTER — Telehealth (INDEPENDENT_AMBULATORY_CARE_PROVIDER_SITE_OTHER): Payer: 59 | Admitting: Family Medicine

## 2019-06-09 ENCOUNTER — Other Ambulatory Visit: Payer: Self-pay

## 2019-06-09 DIAGNOSIS — F3289 Other specified depressive episodes: Secondary | ICD-10-CM | POA: Diagnosis not present

## 2019-06-09 DIAGNOSIS — Z6841 Body Mass Index (BMI) 40.0 and over, adult: Secondary | ICD-10-CM

## 2019-06-09 DIAGNOSIS — Z9989 Dependence on other enabling machines and devices: Secondary | ICD-10-CM

## 2019-06-09 DIAGNOSIS — G4733 Obstructive sleep apnea (adult) (pediatric): Secondary | ICD-10-CM | POA: Diagnosis not present

## 2019-06-09 DIAGNOSIS — E559 Vitamin D deficiency, unspecified: Secondary | ICD-10-CM

## 2019-06-09 MED ORDER — VITAMIN D (ERGOCALCIFEROL) 1.25 MG (50000 UNIT) PO CAPS: ORAL_CAPSULE | ORAL | 0 refills | Status: DC

## 2019-06-10 NOTE — Progress Notes (Signed)
Office: [phone]  /  Fax: [phone] TeleHealth Visit:  Kim Barnes has verbally consented to this TeleHealth visit today. The patient is located at home, the provider is located at the [facility]. The participants in this visit include the listed provider and patient. The visit was conducted today via Webex.  HPI:   Chief Complaint: OBESITY Kim Barnes is here to discuss her progress with her obesity treatment plan. She is on the Category 2 plan and is following her eating plan approximately 50% of the time. She states she is exercising 0 minutes 0 times per week. Kim Barnes has recommitted to sticking to the plan this week and stopped sugar sweetened beverages 2-3 days ago. She denies polyphagia the last few days. We were unable to weigh the patient today for this TeleHealth visit. She states her weight was 377.8 lbs this morning. She has lost 6 lbs since starting treatment with us.  Vitamin D deficiency Kim Barnes has a diagnosis of Vitamin D deficiency, which is not at goal. Her last Vitamin D level was reported to be 19.6 on 03/10/2019. She is currently taking prescription Vit D and denies nausea, vomiting or muscle weakness.  Obstructive Sleep Apnea Kim Barnes states she is still not using her CPAP. She is in the process of moving and is trying to pack so she does not want to unpack her CPAP. She does report fatigue.  Depression with emotional eating behaviors [name] is struggling with emotional eating and using food for comfort to the extent that it is negatively impacting her health. She often snacks when she is not hungry. Kim Barnes sometimes feels she is out of control and then feels guilty that she made poor food choices. She has been working on behavior modification techniques to help reduce her emotional eating and has been somewhat successful. Kim Barnes reports taking bupropion sporadically and states she has not had many food cravings recently. She shows no sign of  suicidal or homicidal ideations.  Depression screen PHQ 2/9 02/26/2018  Decreased Interest 2  Down, Depressed, Hopeless 1  PHQ - 2 Score 3  Altered sleeping 2  Tired, decreased energy 2  Change in appetite 2  Feeling bad or failure about yourself  2  Trouble concentrating 1  Moving slowly or fidgety/restless 1  Suicidal thoughts 0  PHQ-9 Score 13  Difficult doing work/chores Somewhat difficult   ASSESSMENT AND PLAN:  Vitamin D deficiency - Plan: Vitamin D, Ergocalciferol, (DRISDOL) 1.25 MG (50000 UT) CAPS capsule  OSA on CPAP  Other depression - with emotionl eating  Class 3 severe obesity with serious comorbidity and body mass index (BMI) of 60.0 to 69.9 in adult, unspecified obesity type (HCC)  PLAN:  Vitamin D Deficiency [name] was informed that low Vitamin D levels contributes to fatigue and are associated with obesity, breast, and colon cancer. She agrees to continue to take prescription Vit D @ 50,000 IU every week #4 with 0 refills and will follow-up for routine testing of Vitamin D, at least 2-3 times per year. She was informed of the risk of over-replacement of Vitamin D and agrees to not increase her dose unless she discusses this with us first. [name] agrees to follow-up with our clinic in 2 weeks.  Obstructive Sleep Apnea Kim Barnes will start using her CPAP after her move.  Depression with Emotional Eating Behaviors We discussed behavior modification techniques today to help Kim Barnes deal with her emotional eating and depression. [name] was advised to take bupropion daily and will  follow-up as directed.  Obesity Kim Barnes is currently in the action stage of change. As such, her goal is to continue with weight loss efforts. She has agreed to follow the Category 2 plan. Kim Barnes has not been prescribed exercise at this time.  We discussed the following Behavioral Modification Strategies today: increasing lean protein intake, decreasing simple carbohydrates, decrease liquid  calories, and planning for success.  Kim Barnes has agreed to follow-up with our clinic in 2 weeks. She was informed of the importance of frequent follow-up visits to maximize her success with intensive lifestyle modifications for her multiple health conditions.  ALLERGIES: No Known Allergies  MEDICATIONS: Current Outpatient Medications on File Prior to Visit  Medication Sig Dispense Refill  . albuterol (PROAIR HFA) 108 (90 Base) MCG/ACT inhaler Inhale 2 puffs into the lungs every 4 (four) hours as needed for wheezing or shortness of breath. 18 g 3  . ALPRAZolam (NIRAVAM) 0.5 MG dissolvable tablet Take 1 tablet (0.5 mg total) by mouth 2 (two) times daily as needed for anxiety. 10 tablet 0  . amLODipine (NORVASC) 5 MG tablet Take 1 tablet (5 mg total) by mouth daily. 30 tablet 0  . benzonatate (TESSALON) 200 MG capsule Take 1 capsule (200 mg total) by mouth 2 (two) times daily as needed for cough. 30 capsule 1  . budesonide-formoterol (SYMBICORT) 160-4.5 MCG/ACT inhaler Inhale 2 puffs into the lungs 2 (two) times daily. 3 Inhaler 3  . buPROPion (WELLBUTRIN SR) 200 MG 12 hr tablet Take 1 tablet (200 mg total) by mouth daily. 30 tablet 0  . BYSTOLIC 20 MG TABS TAKE 1 TABLET BY MOUTH EVERY DAY 30 tablet 11  . ibuprofen (ADVIL,MOTRIN) 200 MG tablet Take 1,200 mg by mouth daily as needed for moderate pain (tooth pain).    . metFORMIN (GLUCOPHAGE) 500 MG tablet Take 1 tablet (500 mg total) by mouth daily with breakfast. 30 tablet 0  . triamterene-hydrochlorothiazide (MAXZIDE-25) 37.5-25 MG tablet Take 1 tablet by mouth daily. 30 tablet 11   No current facility-administered medications on file prior to visit.     PAST MEDICAL HISTORY: Past Medical History:  Diagnosis Date  . Anxiety   . Asthma    prn inhaler  . Back pain   . Bell's palsy   . Carpal tunnel syndrome of left wrist 01/2013  . Chest pain   . Cold hands and feet   . Cough   . Depression   . Dry mouth   . Dry skin   . Fatigue   .  GERD (gastroesophageal reflux disease)    occasional; no current med.  [name] History of brain tumor 1996   no neurologic deficits  . HTN (hypertension)    under control with med., has been on med. x 5 yr.  . Joint pain   . Obesity   . Obstructive sleep apnea   . PCOS (polycystic ovarian syndrome)   . Polycystic ovarian syndrome    no current med.; irregular periods  . Shortness of breath   . Stress   . Swelling of extremity   . Wheezing     PAST SURGICAL HISTORY: Past Surgical History:  Procedure Laterality Date  . BRAIN SURGERY  1996   exc. tumor  . CARPAL TUNNEL RELEASE Left 02/10/2013   Procedure: CARPAL TUNNEL RELEASE;  Surgeon: Wynonia Sours, MD;  Location: [place];  Service: Orthopedics;  Laterality: Left;  ANESTHESIA: IV REGIONAL FAB  . HYSTEROSCOPY W/D&C  02/21/2006  . HYSTEROSCOPY W/D&C  12/17/2011   Procedure: DILATATION AND CURETTAGE /HYSTEROSCOPY;  Surgeon: Meriel Picaichard M Holland, MD;  Location: [facility];  Service: Gynecology;  Laterality: N/A;  . LEEP  01/07/2004  . TONSILLECTOMY  09/21/2002    SOCIAL HISTORY: Social History   Tobacco Use  . Smoking status: Never Smoker  . Smokeless tobacco: Never Used  Substance Use Topics  . Alcohol use: Yes    Comment: rare; socially  . Drug use: No    FAMILY HISTORY: Family History  Problem Relation Age of Onset  . Hypertension Other   . Lung cancer Father        Smoker  . Cancer Father        lung/ smoker  . Heart disease Father   . Alcoholism Father   . Breast cancer Maternal Grandmother   . Cancer Maternal Grandmother        breast  . Emphysema Paternal Grandmother   . Asthma Mother   . Hyperlipidemia Mother   . Hypertension Mother   . Sleep apnea Mother   . Asthma Maternal Aunt   . Breast cancer Maternal Aunt        brain   . Breast cancer Paternal Aunt        prostate   ROS: Review of Systems  Constitutional: Positive for malaise/fatigue.  Respiratory:       Positive for obstructive sleep  apnea.  Gastrointestinal: Negative for nausea and vomiting.  Musculoskeletal:       Negative for muscle weakness.  Psychiatric/Behavioral: Positive for depression (emotional eating). Negative for suicidal ideas.       Negative for homicidal ideas.   PHYSICAL EXAM: Pt in no acute distress  RECENT LABS AND TESTS: BMET    Component Value Date/Time   NA 147 (H) 03/10/2019 0932   K 4.3 03/10/2019 0932   CL 104 03/10/2019 0932   CO2 26 03/10/2019 0932   GLUCOSE 80 03/10/2019 0932   GLUCOSE 103 (H) 12/31/2017 0926   GLUCOSE 83 10/08/2006 1418   BUN 12 03/10/2019 0932   CREATININE 0.79 03/10/2019 0932   CALCIUM 9.7 03/10/2019 0932   GFRNONAA 94 03/10/2019 0932   GFRAA 108 03/10/2019 0932   Lab Results  Component Value Date   HGBA1C 5.8 (H) 03/10/2019   HGBA1C 5.7 (H) 11/11/2018   HGBA1C 5.7 (H) 07/28/2018   HGBA1C 5.8 (H) 02/26/2018   HGBA1C 5.3 03/31/2015   Lab Results  Component Value Date   INSULIN 34.6 (H) 03/10/2019   INSULIN 26.3 (H) 11/11/2018   INSULIN 14.8 07/28/2018   INSULIN 26.4 (H) 02/26/2018   CBC    Component Value Date/Time   WBC 5.4 02/26/2018 0936   WBC 5.7 12/31/2017 0926   RBC 4.74 02/26/2018 0936   RBC 4.82 12/31/2017 0926   HGB 13.6 02/26/2018 0936   HCT 42.2 02/26/2018 0936   PLT 281.0 12/31/2017 0926   MCV 89 02/26/2018 0936   MCH 28.7 02/26/2018 0936   MCH 29.3 02/04/2017 1851   MCHC 32.2 02/26/2018 0936   MCHC 33.8 12/31/2017 0926   RDW 14.7 02/26/2018 0936   LYMPHSABS 2.1 02/26/2018 0936   MONOABS 0.3 12/31/2017 0926   EOSABS 0.1 02/26/2018 0936   BASOSABS 0.0 02/26/2018 0936   Iron/TIBC/Ferritin/ %Sat No results found for: IRON, TIBC, FERRITIN, IRONPCTSAT Lipid Panel     Component Value Date/Time   CHOL 216 (H) 03/10/2019 0932   TRIG 81 03/10/2019 0932   HDL 55 03/10/2019 0932   CHOLHDL 4 12/31/2017 0926  VLDL 17.2 12/31/2017 0926   LDLCALC 145 (H) 03/10/2019 0932   LDLDIRECT 149.4 05/24/2010 0904   Hepatic Function  Panel     Component Value Date/Time   PROT 7.6 03/10/2019 0932   ALBUMIN 4.4 03/10/2019 0932   AST 20 03/10/2019 0932   ALT 17 03/10/2019 0932   ALKPHOS 67 03/10/2019 0932   BILITOT 0.4 03/10/2019 0932   BILIDIR 0.0 12/31/2017 0926      Component Value Date/Time   TSH 3.240 03/10/2019 0932   TSH 2.540 02/26/2018 0936   TSH 1.78 12/31/2017 0926   Results for Kim LarocheMCRAE, Stamatia L (MRN [id number]) as of 06/10/2019 10:37  Ref. Range 03/10/2019 09:32  Vitamin D, 25-Hydroxy Latest Ref Range: 30.0 - 100.0 ng/mL 19.6 (L)   I, Marianna Paymentenise Haag, am acting as Energy managertranscriptionist for AshlandDawn Destinae Neubecker, FNP  I have reviewed the above documentation for accuracy and completeness, and I agree with the above.  - Carlin Attridge, FNP-C.

## 2019-06-23 ENCOUNTER — Other Ambulatory Visit: Payer: Self-pay

## 2019-06-23 ENCOUNTER — Encounter (INDEPENDENT_AMBULATORY_CARE_PROVIDER_SITE_OTHER): Payer: Self-pay | Admitting: Bariatrics

## 2019-06-23 ENCOUNTER — Telehealth (INDEPENDENT_AMBULATORY_CARE_PROVIDER_SITE_OTHER): Payer: 59 | Admitting: Bariatrics

## 2019-06-23 DIAGNOSIS — Z6841 Body Mass Index (BMI) 40.0 and over, adult: Secondary | ICD-10-CM | POA: Diagnosis not present

## 2019-06-23 DIAGNOSIS — F3289 Other specified depressive episodes: Secondary | ICD-10-CM

## 2019-06-23 DIAGNOSIS — E559 Vitamin D deficiency, unspecified: Secondary | ICD-10-CM

## 2019-06-24 NOTE — Progress Notes (Signed)
Office: [phone]  /  Fax: [phone] TeleHealth Visit:  Kim Barnes has verbally consented to this TeleHealth visit today. The patient is located at home, the provider is located at the [facility] office. The participants in this visit include the listed provider and patient. The visit was conducted today via Webex.  HPI:   Chief Complaint: OBESITY Kim Barnes is here to discuss her progress with her obesity treatment plan. She is on the Category 2 plan and is following her eating plan approximately 30% of the time. She states she is exercising 0 minutes 0 times per week. Kim Barnes states that she has gained 2-3 lbs. She has not done well with celebrations and moving. She is doing better with her protein. We were unable to weigh the patient today for this TeleHealth visit. She feels as if she has gained 2-3 lbs since her last visit. She has lost 6 lbs since starting treatment with Kim Barnes.  Vitamin D deficiency Kim Barnes has a diagnosis of Vitamin D deficiency. Her last Vitamin D level was 19.6 on 03/10/2019. She is currently taking Vit D and denies nausea, vomiting or muscle weakness.  Depression with emotional eating behaviors Kim Barnes is struggling with emotional eating and using food for comfort to the extent that it is negatively impacting her health. She often snacks when she is not hungry. Kim Barnes sometimes feels she is out of control and then feels guilty that she made poor food choices. She has been working on behavior modification techniques to help reduce her emotional eating and has been somewhat successful. Kim Barnes is taking Wellbutrin and shows no sign of suicidal or homicidal ideations.  Depression screen PHQ 2/9 02/26/2018  Decreased Interest 2  Down, Depressed, Hopeless 1  PHQ - 2 Score 3  Altered sleeping 2  Tired, decreased energy 2  Change in appetite 2  Feeling bad or failure about yourself  2  Trouble concentrating 1  Moving slowly or fidgety/restless 1   Suicidal thoughts 0  PHQ-9 Score 13  Difficult doing work/chores Somewhat difficult   ASSESSMENT AND PLAN:  Vitamin D deficiency  Other depression  Class 3 severe obesity with serious comorbidity and body mass index (BMI) of 60.0 to 69.9 in adult, unspecified obesity type (HCC)  PLAN:  Vitamin D Deficiency Kim Barnes was informed that low Vitamin D levels contributes to fatigue and are associated with obesity, breast, and colon cancer. She agrees to continue taking Vit D and will follow-up for routine testing of Vitamin D, at least 2-3 times per year. She was informed of the risk of over-replacement of Vitamin D and agrees to not increase her dose unless she discusses this with Kim Barnes first. Kim Barnes agrees to follow-up with our clinic in 2 weeks.  Depression with Emotional Eating Behaviors We discussed behavior modification techniques today to help Kim Barnes deal with her emotional eating and depression. Kim Barnes will continue Wellbutrin and will take at breakfast. She will follow-up as directed to monitor her progress.  Obesity Kim Barnes is currently in the action stage of change. As such, her goal is to continue with weight loss efforts. She has agreed to follow the Category 2 plan. Kim Barnes will work on meal planning, will weigh herself until she returns to the office, and will get back on track. Kim Barnes has been instructed to work up to a goal of 150 minutes of combined cardio and strengthening exercise per week for weight loss and overall health benefits. We discussed the following Behavioral Modification Strategies today: increasing lean  protein intake, decreasing simple carbohydrates, increasing vegetables, increase H20 intake, decrease eating out, no skipping meals, work on meal planning and easy cooking plans, and keeping healthy foods in the home.  Kim Barnes has agreed to follow up with our clinic in 2 weeks. She was informed of the importance of frequent follow up visits to maximize her success with  intensive lifestyle modifications for her multiple health conditions.  ALLERGIES: No Known Allergies  MEDICATIONS: Current Outpatient Medications on File Prior to Visit  Medication Sig Dispense Refill  . albuterol (PROAIR HFA) 108 (90 Base) MCG/ACT inhaler Inhale 2 puffs into the lungs every 4 (four) hours as needed for wheezing or shortness of breath. 18 g 3  . ALPRAZolam (NIRAVAM) 0.5 MG dissolvable tablet Take 1 tablet (0.5 mg total) by mouth 2 (two) times daily as needed for anxiety. 10 tablet 0  . amLODipine (NORVASC) 5 MG tablet Take 1 tablet (5 mg total) by mouth daily. 30 tablet 0  . benzonatate (TESSALON) 200 MG capsule Take 1 capsule (200 mg total) by mouth 2 (two) times daily as needed for cough. 30 capsule 1  . budesonide-formoterol (SYMBICORT) 160-4.5 MCG/ACT inhaler Inhale 2 puffs into the lungs 2 (two) times daily. 3 Inhaler 3  . buPROPion (WELLBUTRIN SR) 200 MG 12 hr tablet Take 1 tablet (200 mg total) by mouth daily. 30 tablet 0  . BYSTOLIC 20 MG TABS TAKE 1 TABLET BY MOUTH EVERY DAY 30 tablet 11  . ibuprofen (ADVIL,MOTRIN) 200 MG tablet Take 1,200 mg by mouth daily as needed for moderate pain (tooth pain).    . metFORMIN (GLUCOPHAGE) 500 MG tablet Take 1 tablet (500 mg total) by mouth daily with breakfast. 30 tablet 0  . triamterene-hydrochlorothiazide (MAXZIDE-25) 37.5-25 MG tablet Take 1 tablet by mouth daily. 30 tablet 11  . Vitamin D, Ergocalciferol, (DRISDOL) 1.25 MG (50000 UT) CAPS capsule TAKE ONE CAPSULE BY MOUTH EVERY 7 DAYS 4 capsule 0   No current facility-administered medications on file prior to visit.     PAST MEDICAL HISTORY: Past Medical History:  Diagnosis Date  . Anxiety   . Asthma    prn inhaler  . Back pain   . Bell's palsy   . Carpal tunnel syndrome of left wrist 01/2013  . Chest pain   . Cold hands and feet   . Cough   . Depression   . Dry mouth   . Dry skin   . Fatigue   . GERD (gastroesophageal reflux disease)    occasional; no current  med.  [name]. History of brain tumor 1996   no neurologic deficits  . HTN (hypertension)    under control with med., has been on med. x 5 yr.  . Joint pain   . Obesity   . Obstructive sleep apnea   . PCOS (polycystic ovarian syndrome)   . Polycystic ovarian syndrome    no current med.; irregular periods  . Shortness of breath   . Stress   . Swelling of extremity   . Wheezing     PAST SURGICAL HISTORY: Past Surgical History:  Procedure Laterality Date  . BRAIN SURGERY  1996   exc. tumor  . CARPAL TUNNEL RELEASE Left 02/10/2013   Procedure: CARPAL TUNNEL RELEASE;  Surgeon: Nicki ReaperGary R Kuzma, MD;  Location: [facility];  Service: Orthopedics;  Laterality: Left;  ANESTHESIA: IV REGIONAL FAB  . HYSTEROSCOPY W/D&C  02/21/2006  . HYSTEROSCOPY W/D&C  12/17/2011   Procedure: DILATATION AND CURETTAGE /HYSTEROSCOPY;  Surgeon:  Meriel Picaichard M Holland, MD;  Location: [facility];  Service: Gynecology;  Laterality: N/A;  . LEEP  01/07/2004  . TONSILLECTOMY  09/21/2002    SOCIAL HISTORY: Social History   Tobacco Use  . Smoking status: Never Smoker  . Smokeless tobacco: Never Used  Substance Use Topics  . Alcohol use: Yes    Comment: rare; socially  . Drug use: No    FAMILY HISTORY: Family History  Problem Relation Age of Onset  . Hypertension Other   . Lung cancer Father        Smoker  . Cancer Father        lung/ smoker  . Heart disease Father   . Alcoholism Father   . Breast cancer Maternal Grandmother   . Cancer Maternal Grandmother        breast  . Emphysema Paternal Grandmother   . Asthma Mother   . Hyperlipidemia Mother   . Hypertension Mother   . Sleep apnea Mother   . Asthma Maternal Aunt   . Breast cancer Maternal Aunt        brain   . Breast cancer Paternal Aunt        prostate   ROS: Review of Systems  Gastrointestinal: Negative for nausea and vomiting.  Musculoskeletal:       Negative for muscle weakness.  Psychiatric/Behavioral: Positive for depression  (emotional eating). Negative for suicidal ideas.       Negative for homicidal ideas.   PHYSICAL EXAM: Pt in no acute distress  RECENT LABS AND TESTS: BMET    Component Value Date/Time   NA 147 (H) 03/10/2019 0932   K 4.3 03/10/2019 0932   CL 104 03/10/2019 0932   CO2 26 03/10/2019 0932   GLUCOSE 80 03/10/2019 0932   GLUCOSE 103 (H) 12/31/2017 0926   GLUCOSE 83 10/08/2006 1418   BUN 12 03/10/2019 0932   CREATININE 0.79 03/10/2019 0932   CALCIUM 9.7 03/10/2019 0932   GFRNONAA 94 03/10/2019 0932   GFRAA 108 03/10/2019 0932   Lab Results  Component Value Date   HGBA1C 5.8 (H) 03/10/2019   HGBA1C 5.7 (H) 11/11/2018   HGBA1C 5.7 (H) 07/28/2018   HGBA1C 5.8 (H) 02/26/2018   HGBA1C 5.3 03/31/2015   Lab Results  Component Value Date   INSULIN 34.6 (H) 03/10/2019   INSULIN 26.3 (H) 11/11/2018   INSULIN 14.8 07/28/2018   INSULIN 26.4 (H) 02/26/2018   CBC    Component Value Date/Time   WBC 5.4 02/26/2018 0936   WBC 5.7 12/31/2017 0926   RBC 4.74 02/26/2018 0936   RBC 4.82 12/31/2017 0926   HGB 13.6 02/26/2018 0936   HCT 42.2 02/26/2018 0936   PLT 281.0 12/31/2017 0926   MCV 89 02/26/2018 0936   MCH 28.7 02/26/2018 0936   MCH 29.3 02/04/2017 1851   MCHC 32.2 02/26/2018 0936   MCHC 33.8 12/31/2017 0926   RDW 14.7 02/26/2018 0936   LYMPHSABS 2.1 02/26/2018 0936   MONOABS 0.3 12/31/2017 0926   EOSABS 0.1 02/26/2018 0936   BASOSABS 0.0 02/26/2018 0936   Iron/TIBC/Ferritin/ %Sat No results found for: IRON, TIBC, FERRITIN, IRONPCTSAT Lipid Panel     Component Value Date/Time   CHOL 216 (H) 03/10/2019 0932   TRIG 81 03/10/2019 0932   HDL 55 03/10/2019 0932   CHOLHDL 4 12/31/2017 0926   VLDL 17.2 12/31/2017 0926   LDLCALC 145 (H) 03/10/2019 0932   LDLDIRECT 149.4 05/24/2010 0904   Hepatic Function Panel     Component  Value Date/Time   PROT 7.6 03/10/2019 0932   ALBUMIN 4.4 03/10/2019 0932   AST 20 03/10/2019 0932   ALT 17 03/10/2019 0932   ALKPHOS 67  03/10/2019 0932   BILITOT 0.4 03/10/2019 0932   BILIDIR 0.0 12/31/2017 0926      Component Value Date/Time   TSH 3.240 03/10/2019 0932   TSH 2.540 02/26/2018 0936   TSH 1.78 12/31/2017 0926   Results for EDLYN, ROSENBURG (MRN [id number]) as of 06/24/2019 08:27  Ref. Range 03/10/2019 09:32  Vitamin D, 25-Hydroxy Latest Ref Range: 30.0 - 100.0 ng/mL 19.6 (L)    I, Michaelene Song, am acting as Location manager for [employer], DO  I have reviewed the above documentation for accuracy and completeness, and I agree with the above. -Jearld Lesch, DO

## 2019-07-07 ENCOUNTER — Encounter (INDEPENDENT_AMBULATORY_CARE_PROVIDER_SITE_OTHER): Payer: Self-pay | Admitting: Bariatrics

## 2019-07-07 ENCOUNTER — Telehealth (INDEPENDENT_AMBULATORY_CARE_PROVIDER_SITE_OTHER): Payer: 59 | Admitting: Bariatrics

## 2019-07-07 ENCOUNTER — Other Ambulatory Visit: Payer: Self-pay

## 2019-07-07 DIAGNOSIS — I10 Essential (primary) hypertension: Secondary | ICD-10-CM

## 2019-07-07 DIAGNOSIS — Z6841 Body Mass Index (BMI) 40.0 and over, adult: Secondary | ICD-10-CM | POA: Diagnosis not present

## 2019-07-07 DIAGNOSIS — R7303 Prediabetes: Secondary | ICD-10-CM | POA: Diagnosis not present

## 2019-07-07 MED ORDER — METFORMIN HCL 500 MG PO TABS: 500.0000 mg | ORAL_TABLET | Freq: Every day | ORAL | 0 refills | Status: DC

## 2019-07-07 NOTE — Progress Notes (Signed)
Office: [phone]  /  Fax: [phone] TeleHealth Visit:  Kim Barnes has verbally consented to this TeleHealth visit today. The patient is driving her car, the provider is located at the [facility]. The participants in this visit include the listed provider and patient. The visit was conducted today via telephone call.  HPI:   Chief Complaint: OBESITY Kim Barnes is here to discuss her progress with her obesity treatment plan. She is on the Category 2 plan and is following her eating plan approximately 50% of the time. She states she is exercising 0 minutes 0 times per week. Kim Barnes states that her weight remains the same. She is doing well with her water intake. She does state she struggles some with her meal planning. We were unable to weigh the patient today for this TeleHealth visit. She feels as if she has maintained her weight since her last visit. She has lost 6 lbs since starting treatment with [name].  Pre-Diabetes Kim Barnes has a diagnosis of prediabetes based on her elevated Hgb A1c and was informed this puts her at greater risk of developing diabetes. Her last A1c was 5.8 on 03/10/2019 and her insulin was 34.6. She is taking metformin currently and continues to work on diet and exercise to decrease risk of diabetes. She denies nausea or hypoglycemia.  Hypertension Kim Barnes is a 43 y.o. female with hypertension and is taking Norvasc.  Kim Barnes denies chest pain or shortness of breath on exertion. She is working weight loss to help control her blood pressure with the goal of decreasing her risk of heart attack and stroke.  Vitamin D deficiency Kim Barnes has a diagnosis of Vitamin D deficiency. She is currently taking Vit D and denies nausea, vomiting or muscle weakness.  ASSESSMENT AND PLAN:  Essential hypertension  Prediabetes - Plan: metFORMIN (GLUCOPHAGE) 500 MG tablet  BMI 60.0-69.9, adult ([place])  PLAN:  Pre-Diabetes Kim Barnes will continue to  work on weight loss, exercise, and decreasing simple carbohydrates in her diet to help decrease the risk of diabetes. We dicussed metformin including benefits and risks. She was informed that eating too many simple carbohydrates or too many calories at one sitting increases the likelihood of GI side effects. Kim Barnes was given a prescription for metformin 500 mg 1 in the a.m. #30 with 0 refills. She agrees to follow-up with our clinic in 2 weeks.  Hypertension We discussed sodium restriction, working on healthy weight loss, and a regular exercise program as the means to achieve improved blood pressure control. Kim Barnes agreed with this plan and agreed to follow up as directed. We will continue to monitor her blood pressure as well as her progress with the above lifestyle modifications. She will continue her medications as prescribed and will watch for signs of hypotension as she continues her lifestyle modifications.  Vitamin D Deficiency Kim Barnes was informed that low Vitamin D levels contributes to fatigue and are associated with obesity, breast, and colon cancer. She agrees to continue taking Vit D and will follow-up for routine testing of Vitamin D, at least 2-3 times per year. She was informed of the risk of over-replacement of Vitamin D and agrees to not increase her dose unless she discusses this with [name] first. Kim Barnes agrees to follow-up with our clinic in 2 weeks.  Obesity Kim Barnes is currently in the action stage of change. As such, her goal is to continue with weight loss efforts. She has agreed to follow the Category 2 plan. Kim Barnes will  work on meal planning and intentional eating. Kim Barnes has been instructed to start walking for weight loss and overall health benefits. We discussed the following Behavioral Modification Strategies today: increasing lean protein intake, decreasing simple carbohydrates, increasing vegetables, increase H20 intake, decrease eating out, no skipping meals, work on meal  planning and easy cooking plans, keeping healthy foods in the home, and planning for success.  Kim Barnes has agreed to follow-up with our clinic in 2 weeks. She was informed of the importance of frequent follow-up visits to maximize her success with intensive lifestyle modifications for her multiple health conditions.  ALLERGIES: No Known Allergies  MEDICATIONS: Current Outpatient Medications on File Prior to Visit  Medication Sig Dispense Refill  . albuterol (PROAIR HFA) 108 (90 Base) MCG/ACT inhaler Inhale 2 puffs into the lungs every 4 (four) hours as needed for wheezing or shortness of breath. 18 g 3  . ALPRAZolam (NIRAVAM) 0.5 MG dissolvable tablet Take 1 tablet (0.5 mg total) by mouth 2 (two) times daily as needed for anxiety. 10 tablet 0  . amLODipine (NORVASC) 5 MG tablet Take 1 tablet (5 mg total) by mouth daily. 30 tablet 0  . benzonatate (TESSALON) 200 MG capsule Take 1 capsule (200 mg total) by mouth 2 (two) times daily as needed for cough. 30 capsule 1  . budesonide-formoterol (SYMBICORT) 160-4.5 MCG/ACT inhaler Inhale 2 puffs into the lungs 2 (two) times daily. 3 Inhaler 3  . buPROPion (WELLBUTRIN SR) 200 MG 12 hr tablet Take 1 tablet (200 mg total) by mouth daily. 30 tablet 0  . BYSTOLIC 20 MG TABS TAKE 1 TABLET BY MOUTH EVERY DAY 30 tablet 11  . ibuprofen (ADVIL,MOTRIN) 200 MG tablet Take 1,200 mg by mouth daily as needed for moderate pain (tooth pain).    . triamterene-hydrochlorothiazide (MAXZIDE-25) 37.5-25 MG tablet Take 1 tablet by mouth daily. 30 tablet 11  . Vitamin D, Ergocalciferol, (DRISDOL) 1.25 MG (50000 UT) CAPS capsule TAKE ONE CAPSULE BY MOUTH EVERY 7 DAYS 4 capsule 0   No current facility-administered medications on file prior to visit.     PAST MEDICAL HISTORY: Past Medical History:  Diagnosis Date  . Anxiety   . Asthma    prn inhaler  . Back pain   . Bell's palsy   . Carpal tunnel syndrome of left wrist 01/2013  . Chest pain   . Cold hands and feet   .  Cough   . Depression   . Dry mouth   . Dry skin   . Fatigue   . GERD (gastroesophageal reflux disease)    occasional; no current med.  [name]. History of brain tumor 1996   no neurologic deficits  . HTN (hypertension)    under control with med., has been on med. x 5 yr.  . Joint pain   . Obesity   . Obstructive sleep apnea   . PCOS (polycystic ovarian syndrome)   . Polycystic ovarian syndrome    no current med.; irregular periods  . Shortness of breath   . Stress   . Swelling of extremity   . Wheezing     PAST SURGICAL HISTORY: Past Surgical History:  Procedure Laterality Date  . BRAIN SURGERY  1996   exc. tumor  . CARPAL TUNNEL RELEASE Left 02/10/2013   Procedure: CARPAL TUNNEL RELEASE;  Surgeon: Nicki ReaperGary R Kuzma, MD;  Location: [facility];  Service: Orthopedics;  Laterality: Left;  ANESTHESIA: IV REGIONAL FAB  . HYSTEROSCOPY W/D&C  02/21/2006  . HYSTEROSCOPY W/D&C  12/17/2011   Procedure: DILATATION AND CURETTAGE /HYSTEROSCOPY;  Surgeon: Meriel Picaichard M Holland, MD;  Location: [facility];  Service: Gynecology;  Laterality: N/A;  . LEEP  01/07/2004  . TONSILLECTOMY  09/21/2002    SOCIAL HISTORY: Social History   Tobacco Use  . Smoking status: Never Smoker  . Smokeless tobacco: Never Used  Substance Use Topics  . Alcohol use: Yes    Comment: rare; socially  . Drug use: No    FAMILY HISTORY: Family History  Problem Relation Age of Onset  . Hypertension Other   . Lung cancer Father        Smoker  . Cancer Father        lung/ smoker  . Heart disease Father   . Alcoholism Father   . Breast cancer Maternal Grandmother   . Cancer Maternal Grandmother        breast  . Emphysema Paternal Grandmother   . Asthma Mother   . Hyperlipidemia Mother   . Hypertension Mother   . Sleep apnea Mother   . Asthma Maternal Aunt   . Breast cancer Maternal Aunt        brain   . Breast cancer Paternal Aunt        prostate   ROS: Review of Systems  Gastrointestinal: Negative for  nausea and vomiting.  Musculoskeletal:       Negative for muscle weakness.   PHYSICAL EXAM: Pt in no acute distress  RECENT LABS AND TESTS: BMET    Component Value Date/Time   NA 147 (H) 03/10/2019 0932   K 4.3 03/10/2019 0932   CL 104 03/10/2019 0932   CO2 26 03/10/2019 0932   GLUCOSE 80 03/10/2019 0932   GLUCOSE 103 (H) 12/31/2017 0926   GLUCOSE 83 10/08/2006 1418   BUN 12 03/10/2019 0932   CREATININE 0.79 03/10/2019 0932   CALCIUM 9.7 03/10/2019 0932   GFRNONAA 94 03/10/2019 0932   GFRAA 108 03/10/2019 0932   Lab Results  Component Value Date   HGBA1C 5.8 (H) 03/10/2019   HGBA1C 5.7 (H) 11/11/2018   HGBA1C 5.7 (H) 07/28/2018   HGBA1C 5.8 (H) 02/26/2018   HGBA1C 5.3 03/31/2015   Lab Results  Component Value Date   INSULIN 34.6 (H) 03/10/2019   INSULIN 26.3 (H) 11/11/2018   INSULIN 14.8 07/28/2018   INSULIN 26.4 (H) 02/26/2018   CBC    Component Value Date/Time   WBC 5.4 02/26/2018 0936   WBC 5.7 12/31/2017 0926   RBC 4.74 02/26/2018 0936   RBC 4.82 12/31/2017 0926   HGB 13.6 02/26/2018 0936   HCT 42.2 02/26/2018 0936   PLT 281.0 12/31/2017 0926   MCV 89 02/26/2018 0936   MCH 28.7 02/26/2018 0936   MCH 29.3 02/04/2017 1851   MCHC 32.2 02/26/2018 0936   MCHC 33.8 12/31/2017 0926   RDW 14.7 02/26/2018 0936   LYMPHSABS 2.1 02/26/2018 0936   MONOABS 0.3 12/31/2017 0926   EOSABS 0.1 02/26/2018 0936   BASOSABS 0.0 02/26/2018 0936   Iron/TIBC/Ferritin/ %Sat No results found for: IRON, TIBC, FERRITIN, IRONPCTSAT Lipid Panel     Component Value Date/Time   CHOL 216 (H) 03/10/2019 0932   TRIG 81 03/10/2019 0932   HDL 55 03/10/2019 0932   CHOLHDL 4 12/31/2017 0926   VLDL 17.2 12/31/2017 0926   LDLCALC 145 (H) 03/10/2019 0932   LDLDIRECT 149.4 05/24/2010 0904   Hepatic Function Panel     Component Value Date/Time   PROT 7.6 03/10/2019 0932   ALBUMIN  4.4 03/10/2019 0932   AST 20 03/10/2019 0932   ALT 17 03/10/2019 0932   ALKPHOS 67 03/10/2019 0932    BILITOT 0.4 03/10/2019 0932   BILIDIR 0.0 12/31/2017 0926      Component Value Date/Time   TSH 3.240 03/10/2019 0932   TSH 2.540 02/26/2018 0936   TSH 1.78 12/31/2017 0926   Results for Kim Barnes, Kim Barnes (MRN [id number]) as of 07/07/2019 15:45  Ref. Range 03/10/2019 09:32  Vitamin D, 25-Hydroxy Latest Ref Range: 30.0 - 100.0 ng/mL 19.6 (Barn[place])   I, Marianna Paymentenise Haag, am acting as Energy managertranscriptionist for [employer] , DO  I have reviewed the above documentation for accuracy and completeness, and I agree with the above. -Corinna CapraAngel , DO

## 2019-07-21 ENCOUNTER — Telehealth (INDEPENDENT_AMBULATORY_CARE_PROVIDER_SITE_OTHER): Payer: 59 | Admitting: Bariatrics

## 2019-07-21 ENCOUNTER — Encounter (INDEPENDENT_AMBULATORY_CARE_PROVIDER_SITE_OTHER): Payer: Self-pay | Admitting: Bariatrics

## 2019-07-21 ENCOUNTER — Other Ambulatory Visit: Payer: Self-pay

## 2019-07-21 DIAGNOSIS — I10 Essential (primary) hypertension: Secondary | ICD-10-CM

## 2019-07-21 DIAGNOSIS — R7303 Prediabetes: Secondary | ICD-10-CM

## 2019-07-21 DIAGNOSIS — Z6841 Body Mass Index (BMI) 40.0 and over, adult: Secondary | ICD-10-CM

## 2019-07-22 NOTE — Progress Notes (Signed)
Office: [phone]  /  Fax: [phone] TeleHealth Visit:  Kim Barnes has verbally consented to this TeleHealth visit today. The patient is located at home, the provider is located at the [facility] office. The participants in this visit include the listed provider and patient and any and all parties involved. The visit was conducted today via WebEx.  HPI:   Chief Complaint: OBESITY Kim Barnes is here to discuss her progress with her obesity treatment plan. She is on the Category 2 plan and is following her eating plan approximately 30 % of the time. She states she is exercising 0 minutes 0 times per week. [name] states that she may have gained two to three pounds. She has not weighed herself. She was eating things that she should not. She is doing well with water. We were unable to weigh the patient today for this TeleHealth visit. She feels as if she has gained weight since her last visit. She has lost 6 lbs since starting treatment with us.  Hypertension Kim Barnes is a [age] y.o. female with hypertension. Jaquelinne L Tienda denies chest pain or shortness of breath on exertion. She is working weight loss to help control her blood pressure with the goal of decreasing her risk of heart attack and stroke. [name] blood pressure is well controlled.  Pre-Diabetes Kim Barnes has a diagnosis of prediabetes based on her elevated Hgb A1c and was informed this puts her at greater risk of developing diabetes. Her last A1c was at 5.8 and last insulin level was at 34.6. She is taking metformin currently and continues to work on diet and exercise to decrease risk of diabetes. She denies nausea or hypoglycemia.  ASSESSMENT AND PLAN:  Essential hypertension  Prediabetes  BMI 60.0-69.9, adult (HCC)  PLAN:  Hypertension We discussed sodium restriction, working on healthy weight loss, and a regular exercise program as the means to achieve improved blood pressure control. Rodney agreed  with this plan and agreed to follow up as directed. We will continue to monitor her blood pressure as well as her progress with the above lifestyle modifications. She will continue her medications as prescribed and will watch for signs of hypotension as she continues her lifestyle modifications.  Pre-Diabetes Kim Barnes will continue to work on weight loss, increasing exercise, increasing lean protein and decreasing simple carbohydrates in her diet to help decrease the risk of diabetes. We dicussed metformin including benefits and risks. She was informed that eating too many simple carbohydrates or too many calories at one sitting increases the likelihood of GI side effects. [name] will continue metformin for now and a prescription was not written today. Cortne agreed to follow up with us as directed to monitor her progress.  Obesity Kim Barnes is currently in the action stage of change. As such, her goal is to continue with weight loss efforts She has agreed to follow the Category 2 plan Kim Barnes has been instructed to work up to a goal of 150 minutes of combined cardio and strengthening exercise per week for weight loss and overall health benefits. We discussed the following Behavioral Modification Strategies today: increase H2O intake, no skipping meals, keeping healthy foods in the home, increasing lean protein intake, decreasing simple carbohydrates, increasing vegetables, decrease eating out and work on meal planning and intentional eating Kim Barnes will decrease her carbs (sweets) gradually, and she will have good snacks on hand.  Kim Barnes has agreed to follow up with our clinic in 2 weeks. She was informed of the  importance of frequent follow up visits to maximize her success with intensive lifestyle modifications for her multiple health conditions.  ALLERGIES: No Known Allergies  MEDICATIONS: Current Outpatient Medications on File Prior to Visit  Medication Sig Dispense Refill  . albuterol (PROAIR  HFA) 108 (90 Base) MCG/ACT inhaler Inhale 2 puffs into the lungs every 4 (four) hours as needed for wheezing or shortness of breath. 18 g 3  . ALPRAZolam (NIRAVAM) 0.5 MG dissolvable tablet Take 1 tablet (0.5 mg total) by mouth 2 (two) times daily as needed for anxiety. 10 tablet 0  . amLODipine (NORVASC) 5 MG tablet Take 1 tablet (5 mg total) by mouth daily. 30 tablet 0  . benzonatate (TESSALON) 200 MG capsule Take 1 capsule (200 mg total) by mouth 2 (two) times daily as needed for cough. 30 capsule 1  . budesonide-formoterol (SYMBICORT) 160-4.5 MCG/ACT inhaler Inhale 2 puffs into the lungs 2 (two) times daily. 3 Inhaler 3  . buPROPion (WELLBUTRIN SR) 200 MG 12 hr tablet Take 1 tablet (200 mg total) by mouth daily. 30 tablet 0  . BYSTOLIC 20 MG TABS TAKE 1 TABLET BY MOUTH EVERY DAY 30 tablet 11  . ibuprofen (ADVIL,MOTRIN) 200 MG tablet Take 1,200 mg by mouth daily as needed for moderate pain (tooth pain).    . metFORMIN (GLUCOPHAGE) 500 MG tablet Take 1 tablet (500 mg total) by mouth daily with breakfast. 30 tablet 0  . triamterene-hydrochlorothiazide (MAXZIDE-25) 37.5-25 MG tablet Take 1 tablet by mouth daily. 30 tablet 11  . Vitamin D, Ergocalciferol, (DRISDOL) 1.25 MG (50000 UT) CAPS capsule TAKE ONE CAPSULE BY MOUTH EVERY 7 DAYS 4 capsule 0   No current facility-administered medications on file prior to visit.     PAST MEDICAL HISTORY: Past Medical History:  Diagnosis Date  . Anxiety   . Asthma    prn inhaler  . Back pain   . Bell's palsy   . Carpal tunnel syndrome of left wrist 01/2013  . Chest pain   . Cold hands and feet   . Cough   . Depression   . Dry mouth   . Dry skin   . Fatigue   . GERD (gastroesophageal reflux disease)    occasional; no current med.  [name]. History of brain tumor 1996   no neurologic deficits  . HTN (hypertension)    under control with med., has been on med. x 5 yr.  . Joint pain   . Obesity   . Obstructive sleep apnea   . PCOS (polycystic ovarian  syndrome)   . Polycystic ovarian syndrome    no current med.; irregular periods  . Shortness of breath   . Stress   . Swelling of extremity   . Wheezing     PAST SURGICAL HISTORY: Past Surgical History:  Procedure Laterality Date  . BRAIN SURGERY  1996   exc. tumor  . CARPAL TUNNEL RELEASE Left 02/10/2013   Procedure: CARPAL TUNNEL RELEASE;  Surgeon: Nicki ReaperGary R Kuzma, MD;  Location: [facility];  Service: Orthopedics;  Laterality: Left;  ANESTHESIA: IV REGIONAL FAB  . HYSTEROSCOPY W/D&C  02/21/2006  . HYSTEROSCOPY W/D&C  12/17/2011   Procedure: DILATATION AND CURETTAGE /HYSTEROSCOPY;  Surgeon: Meriel Picaichard M Holland, MD;  Location: [facility];  Service: Gynecology;  Laterality: N/A;  . LEEP  01/07/2004  . TONSILLECTOMY  09/21/2002    SOCIAL HISTORY: Social History   Tobacco Use  . Smoking status: Never Smoker  . Smokeless tobacco: Never Used  Substance  Use Topics  . Alcohol use: Yes    Comment: rare; socially  . Drug use: No    FAMILY HISTORY: Family History  Problem Relation Age of Onset  . Hypertension Other   . Lung cancer Father        Smoker  . Cancer Father        lung/ smoker  . Heart disease Father   . Alcoholism Father   . Breast cancer Maternal Grandmother   . Cancer Maternal Grandmother        breast  . Emphysema Paternal Grandmother   . Asthma Mother   . Hyperlipidemia Mother   . Hypertension Mother   . Sleep apnea Mother   . Asthma Maternal Aunt   . Breast cancer Maternal Aunt        brain   . Breast cancer Paternal Aunt        prostate    ROS: Review of Systems  Constitutional: Negative for weight loss.  Respiratory: Negative for shortness of breath (on exertion).   Cardiovascular: Negative for chest pain.  Gastrointestinal: Negative for nausea.  Endo/Heme/Allergies:       Negative for hypoglycemia    PHYSICAL EXAM: Pt in no acute distress  RECENT LABS AND TESTS: BMET    Component Value Date/Time   NA 147 (H) 03/10/2019 0932   K  4.3 03/10/2019 0932   CL 104 03/10/2019 0932   CO2 26 03/10/2019 0932   GLUCOSE 80 03/10/2019 0932   GLUCOSE 103 (H) 12/31/2017 0926   GLUCOSE 83 10/08/2006 1418   BUN 12 03/10/2019 0932   CREATININE 0.79 03/10/2019 0932   CALCIUM 9.7 03/10/2019 0932   GFRNONAA 94 03/10/2019 0932   GFRAA 108 03/10/2019 0932   Lab Results  Component Value Date   HGBA1C 5.8 (H) 03/10/2019   HGBA1C 5.7 (H) 11/11/2018   HGBA1C 5.7 (H) 07/28/2018   HGBA1C 5.8 (H) 02/26/2018   HGBA1C 5.3 03/31/2015   Lab Results  Component Value Date   INSULIN 34.6 (H) 03/10/2019   INSULIN 26.3 (H) 11/11/2018   INSULIN 14.8 07/28/2018   INSULIN 26.4 (H) 02/26/2018   CBC    Component Value Date/Time   WBC 5.4 02/26/2018 0936   WBC 5.7 12/31/2017 0926   RBC 4.74 02/26/2018 0936   RBC 4.82 12/31/2017 0926   HGB 13.6 02/26/2018 0936   HCT 42.2 02/26/2018 0936   PLT 281.0 12/31/2017 0926   MCV 89 02/26/2018 0936   MCH 28.7 02/26/2018 0936   MCH 29.3 02/04/2017 1851   MCHC 32.2 02/26/2018 0936   MCHC 33.8 12/31/2017 0926   RDW 14.7 02/26/2018 0936   LYMPHSABS 2.1 02/26/2018 0936   MONOABS 0.3 12/31/2017 0926   EOSABS 0.1 02/26/2018 0936   BASOSABS 0.0 02/26/2018 0936   Iron/TIBC/Ferritin/ %Sat No results found for: IRON, TIBC, FERRITIN, IRONPCTSAT Lipid Panel     Component Value Date/Time   CHOL 216 (H) 03/10/2019 0932   TRIG 81 03/10/2019 0932   HDL 55 03/10/2019 0932   CHOLHDL 4 12/31/2017 0926   VLDL 17.2 12/31/2017 0926   LDLCALC 145 (H) 03/10/2019 0932   LDLDIRECT 149.4 05/24/2010 0904   Hepatic Function Panel     Component Value Date/Time   PROT 7.6 03/10/2019 0932   ALBUMIN 4.4 03/10/2019 0932   AST 20 03/10/2019 0932   ALT 17 03/10/2019 0932   ALKPHOS 67 03/10/2019 0932   BILITOT 0.4 03/10/2019 0932   BILIDIR 0.0 12/31/2017 [id number]  Component Value Date/Time   TSH 3.240 03/10/2019 0932   TSH 2.540 02/26/2018 0936   TSH 1.78 12/31/2017 0926     Ref. Range 03/10/2019 09:32   Vitamin D, 25-Hydroxy Latest Ref Range: 30.0 - 100.0 ng/mL 19.6 (L)    I, Doreene Nest, am acting as Location manager for [employer]. Owens Shark, DO  I have reviewed the above documentation for accuracy and completeness, and I agree with the above. -Jearld Lesch, DO

## 2019-08-04 ENCOUNTER — Telehealth (INDEPENDENT_AMBULATORY_CARE_PROVIDER_SITE_OTHER): Payer: 59 | Admitting: Bariatrics

## 2019-08-04 ENCOUNTER — Encounter (INDEPENDENT_AMBULATORY_CARE_PROVIDER_SITE_OTHER): Payer: Self-pay | Admitting: Bariatrics

## 2019-08-04 ENCOUNTER — Other Ambulatory Visit: Payer: Self-pay

## 2019-08-04 DIAGNOSIS — E559 Vitamin D deficiency, unspecified: Secondary | ICD-10-CM

## 2019-08-04 DIAGNOSIS — I10 Essential (primary) hypertension: Secondary | ICD-10-CM

## 2019-08-04 DIAGNOSIS — Z6841 Body Mass Index (BMI) 40.0 and over, adult: Secondary | ICD-10-CM | POA: Diagnosis not present

## 2019-08-04 MED ORDER — AMLODIPINE BESYLATE 5 MG PO TABS: 5.0000 mg | ORAL_TABLET | Freq: Every day | ORAL | 0 refills | Status: DC

## 2019-08-04 MED ORDER — VITAMIN D (ERGOCALCIFEROL) 1.25 MG (50000 UNIT) PO CAPS: ORAL_CAPSULE | ORAL | 0 refills | Status: DC

## 2019-08-06 NOTE — Progress Notes (Signed)
Office: [phone]  /  Fax: [phone] TeleHealth Visit:  Kim Barnes has verbally consented to this TeleHealth visit today. The patient is located at home, the provider is located at the [facility] office. The participants in this visit include the listed provider and patient and any and all parties involved. The visit was conducted today via WebEx.  HPI:   Chief Complaint: OBESITY Kim Barnes is here to discuss her progress with her obesity treatment plan. She is on the Category 2 plan and is following her eating plan approximately 50 % of the time. She states she is exercising 0 minutes 0 times per week. Kim Barnes states that her weight is the same. She is doing better with her meal planning and she is doing well with water. We were unable to weigh the patient today for this TeleHealth visit. She feels as if she has maintained weight since her last visit (weight not reported). She has lost 6 lbs since starting treatment with us.  Vitamin D deficiency Kim Barnes has a diagnosis of vitamin D deficiency. She is currently taking vit D and denies nausea, vomiting or muscle weakness.  Hypertension Kim Barnes is a 43 y.o. female with hypertension. Kim Barnes denies chest pain or shortness of breath on exertion. She is working weight loss to help control her blood pressure with the goal of decreasing her risk of heart attack and stroke. [name] blood pressure is well controlled.  ASSESSMENT AND PLAN:  Class 3 severe obesity with serious comorbidity and body mass index (BMI) of 50.0 to 59.9 in adult, unspecified obesity type (HCC)  Vitamin D deficiency - Plan: Vitamin D, Ergocalciferol, (DRISDOL) 1.25 MG (50000 UT) CAPS capsule  Essential hypertension - Plan: amLODipine (NORVASC) 5 MG tablet  PLAN:  Vitamin D Deficiency [name] was informed that low vitamin D levels contributes to fatigue and are associated with obesity, breast, and colon cancer. She agrees to continue to  take prescription Vit D @50 ,000 IU every week #4 with no refills and will follow up for routine testing of vitamin D, at least 2-3 times per year. She was informed of the risk of over-replacement of vitamin D and agrees to not increase her dose unless she discusses this with us first.  Hypertension We discussed sodium restriction, working on healthy weight loss, and a regular exercise program as the means to achieve improved blood pressure control. Kim Barnes agreed with this plan and agreed to follow up as directed. We will continue to monitor her blood pressure as well as her progress with the above lifestyle modifications. Kim Barnes agrees to take Amlodipine 5 mg once daily #30 with no refills and she will watch for signs of hypotension as she continues her lifestyle modifications.  Obesity Kim Barnes is currently in the action stage of change. As such, her goal is to continue with weight loss efforts She has agreed to follow the Category 2 plan Kim Barnes will go to the track for weight loss and overall health benefits. We discussed the following Behavioral Modification Strategies today: planning for success, increase H2O intake, will not skip meals (will boil eggs the night before), keeping healthy foods in the home, increasing lean protein intake, decreasing simple carbohydrates, increasing vegetables, decrease eating out, work on meal planning and intentional eating and decrease liquid calories  Kim Barnes has agreed to follow up with our clinic in 2 weeks. She was informed of the importance of frequent follow up visits to maximize her success with intensive lifestyle modifications for  her multiple health conditions.  ALLERGIES: No Known Allergies  MEDICATIONS: Current Outpatient Medications on File Prior to Visit  Medication Sig Dispense Refill   albuterol (PROAIR HFA) 108 (90 Base) MCG/ACT inhaler Inhale 2 puffs into the lungs every 4 (four) hours as needed for wheezing or shortness of breath. 18 g 3    ALPRAZolam (NIRAVAM) 0.5 MG dissolvable tablet Take 1 tablet (0.5 mg total) by mouth 2 (two) times daily as needed for anxiety. 10 tablet 0   benzonatate (TESSALON) 200 MG capsule Take 1 capsule (200 mg total) by mouth 2 (two) times daily as needed for cough. 30 capsule 1   budesonide-formoterol (SYMBICORT) 160-4.5 MCG/ACT inhaler Inhale 2 puffs into the lungs 2 (two) times daily. 3 Inhaler 3   buPROPion (WELLBUTRIN SR) 200 MG 12 hr tablet Take 1 tablet (200 mg total) by mouth daily. 30 tablet 0   BYSTOLIC 20 MG TABS TAKE 1 TABLET BY MOUTH EVERY DAY 30 tablet 11   ibuprofen (ADVIL,MOTRIN) 200 MG tablet Take 1,200 mg by mouth daily as needed for moderate pain (tooth pain).     metFORMIN (GLUCOPHAGE) 500 MG tablet Take 1 tablet (500 mg total) by mouth daily with breakfast. 30 tablet 0   triamterene-hydrochlorothiazide (MAXZIDE-25) 37.5-25 MG tablet Take 1 tablet by mouth daily. 30 tablet 11   No current facility-administered medications on file prior to visit.     PAST MEDICAL HISTORY: Past Medical History:  Diagnosis Date   Anxiety    Asthma    prn inhaler   Back pain    Bell's palsy    Carpal tunnel syndrome of left wrist 01/2013   Chest pain    Cold hands and feet    Cough    Depression    Dry mouth    Dry skin    Fatigue    GERD (gastroesophageal reflux disease)    occasional; no current med.   History of brain tumor 1996   no neurologic deficits   HTN (hypertension)    under control with med., has been on med. x 5 yr.   Joint pain    Obesity    Obstructive sleep apnea    PCOS (polycystic ovarian syndrome)    Polycystic ovarian syndrome    no current med.; irregular periods   Shortness of breath    Stress    Swelling of extremity    Wheezing     PAST SURGICAL HISTORY: Past Surgical History:  Procedure Laterality Date   BRAIN SURGERY  1996   exc. tumor   CARPAL TUNNEL RELEASE Left 02/10/2013   Procedure: CARPAL TUNNEL RELEASE;   Surgeon: Nicki Reaper, MD;  Location: [facility];  Service: Orthopedics;  Laterality: Left;  ANESTHESIA: IV REGIONAL FAB   HYSTEROSCOPY W/D&C  02/21/2006   HYSTEROSCOPY W/D&C  12/17/2011   Procedure: DILATATION AND CURETTAGE /HYSTEROSCOPY;  Surgeon: Meriel Pica, MD;  Location: [facility];  Service: Gynecology;  Laterality: N/A;   LEEP  01/07/2004   TONSILLECTOMY  09/21/2002    SOCIAL HISTORY: Social History   Tobacco Use   Smoking status: Never Smoker   Smokeless tobacco: Never Used  Substance Use Topics   Alcohol use: Yes    Comment: rare; socially   Drug use: No    FAMILY HISTORY: Family History  Problem Relation Age of Onset   Hypertension Other    Lung cancer Father        Smoker   Cancer Father  lung/ smoker   Heart disease Father    Alcoholism Father    Breast cancer Maternal Grandmother    Cancer Maternal Grandmother        breast   Emphysema Paternal Grandmother    Asthma Mother    Hyperlipidemia Mother    Hypertension Mother    Sleep apnea Mother    Asthma Maternal Aunt    Breast cancer Maternal Aunt        brain    Breast cancer Paternal Aunt        prostate    ROS: Review of Systems  Constitutional: Negative for weight loss.  Respiratory: Negative for shortness of breath (on exertion).   Cardiovascular: Negative for chest pain.  Gastrointestinal: Negative for nausea and vomiting.  Musculoskeletal:       Negative for muscle weakness    PHYSICAL EXAM: Pt in no acute distress  RECENT LABS AND TESTS: BMET    Component Value Date/Time   NA 147 (H) 03/10/2019 0932   K 4.3 03/10/2019 0932   CL 104 03/10/2019 0932   CO2 26 03/10/2019 0932   GLUCOSE 80 03/10/2019 0932   GLUCOSE 103 (H) 12/31/2017 0926   GLUCOSE 83 10/08/2006 1418   BUN 12 03/10/2019 0932   CREATININE 0.79 03/10/2019 0932   CALCIUM 9.7 03/10/2019 0932   GFRNONAA 94 03/10/2019 0932   GFRAA 108 03/10/2019 0932   Lab Results  Component  Value Date   HGBA1C 5.8 (H) 03/10/2019   HGBA1C 5.7 (H) 11/11/2018   HGBA1C 5.7 (H) 07/28/2018   HGBA1C 5.8 (H) 02/26/2018   HGBA1C 5.3 03/31/2015   Lab Results  Component Value Date   INSULIN 34.6 (H) 03/10/2019   INSULIN 26.3 (H) 11/11/2018   INSULIN 14.8 07/28/2018   INSULIN 26.4 (H) 02/26/2018   CBC    Component Value Date/Time   WBC 5.4 02/26/2018 0936   WBC 5.7 12/31/2017 0926   RBC 4.74 02/26/2018 0936   RBC 4.82 12/31/2017 0926   HGB 13.6 02/26/2018 0936   HCT 42.2 02/26/2018 0936   PLT 281.0 12/31/2017 0926   MCV 89 02/26/2018 0936   MCH 28.7 02/26/2018 0936   MCH 29.3 02/04/2017 1851   MCHC 32.2 02/26/2018 0936   MCHC 33.8 12/31/2017 0926   RDW 14.7 02/26/2018 0936   LYMPHSABS 2.1 02/26/2018 0936   MONOABS 0.3 12/31/2017 0926   EOSABS 0.1 02/26/2018 0936   BASOSABS 0.0 02/26/2018 0936   Iron/TIBC/Ferritin/ %Sat No results found for: IRON, TIBC, FERRITIN, IRONPCTSAT Lipid Panel     Component Value Date/Time   CHOL 216 (H) 03/10/2019 0932   TRIG 81 03/10/2019 0932   HDL 55 03/10/2019 0932   CHOLHDL 4 12/31/2017 0926   VLDL 17.2 12/31/2017 0926   LDLCALC 145 (H) 03/10/2019 0932   LDLDIRECT 149.4 05/24/2010 0904   Hepatic Function Panel     Component Value Date/Time   PROT 7.6 03/10/2019 0932   ALBUMIN 4.4 03/10/2019 0932   AST 20 03/10/2019 0932   ALT 17 03/10/2019 0932   ALKPHOS 67 03/10/2019 0932   BILITOT 0.4 03/10/2019 0932   BILIDIR 0.0 12/31/2017 0926      Component Value Date/Time   TSH 3.240 03/10/2019 0932   TSH 2.540 02/26/2018 0936   TSH 1.78 12/31/2017 0926     Ref. Range 03/10/2019 09:32  Vitamin D, 25-Hydroxy Latest Ref Range: 30.0 - 100.0 ng/mL 19.6 (L)    I, Doreene Nest, am acting as Location manager for [employer]. Owens Shark, DO  I have reviewed the above documentation for accuracy and completeness, and I agree with the above. -Jearld Lesch, DO

## 2019-08-14 ENCOUNTER — Other Ambulatory Visit (INDEPENDENT_AMBULATORY_CARE_PROVIDER_SITE_OTHER): Payer: Self-pay | Admitting: Bariatrics

## 2019-08-14 DIAGNOSIS — R7303 Prediabetes: Secondary | ICD-10-CM

## 2019-08-22 IMAGING — DX DG LUMBAR SPINE 2-3V
3 series · 3 of 3 positions shown · non-contrast
Comparison: 11/11/2017

CLINICAL DATA: Low back pain, no known injury, initial encounter

EXAM:
LUMBAR SPINE - 2-3 VIEW

[l-spine ap]
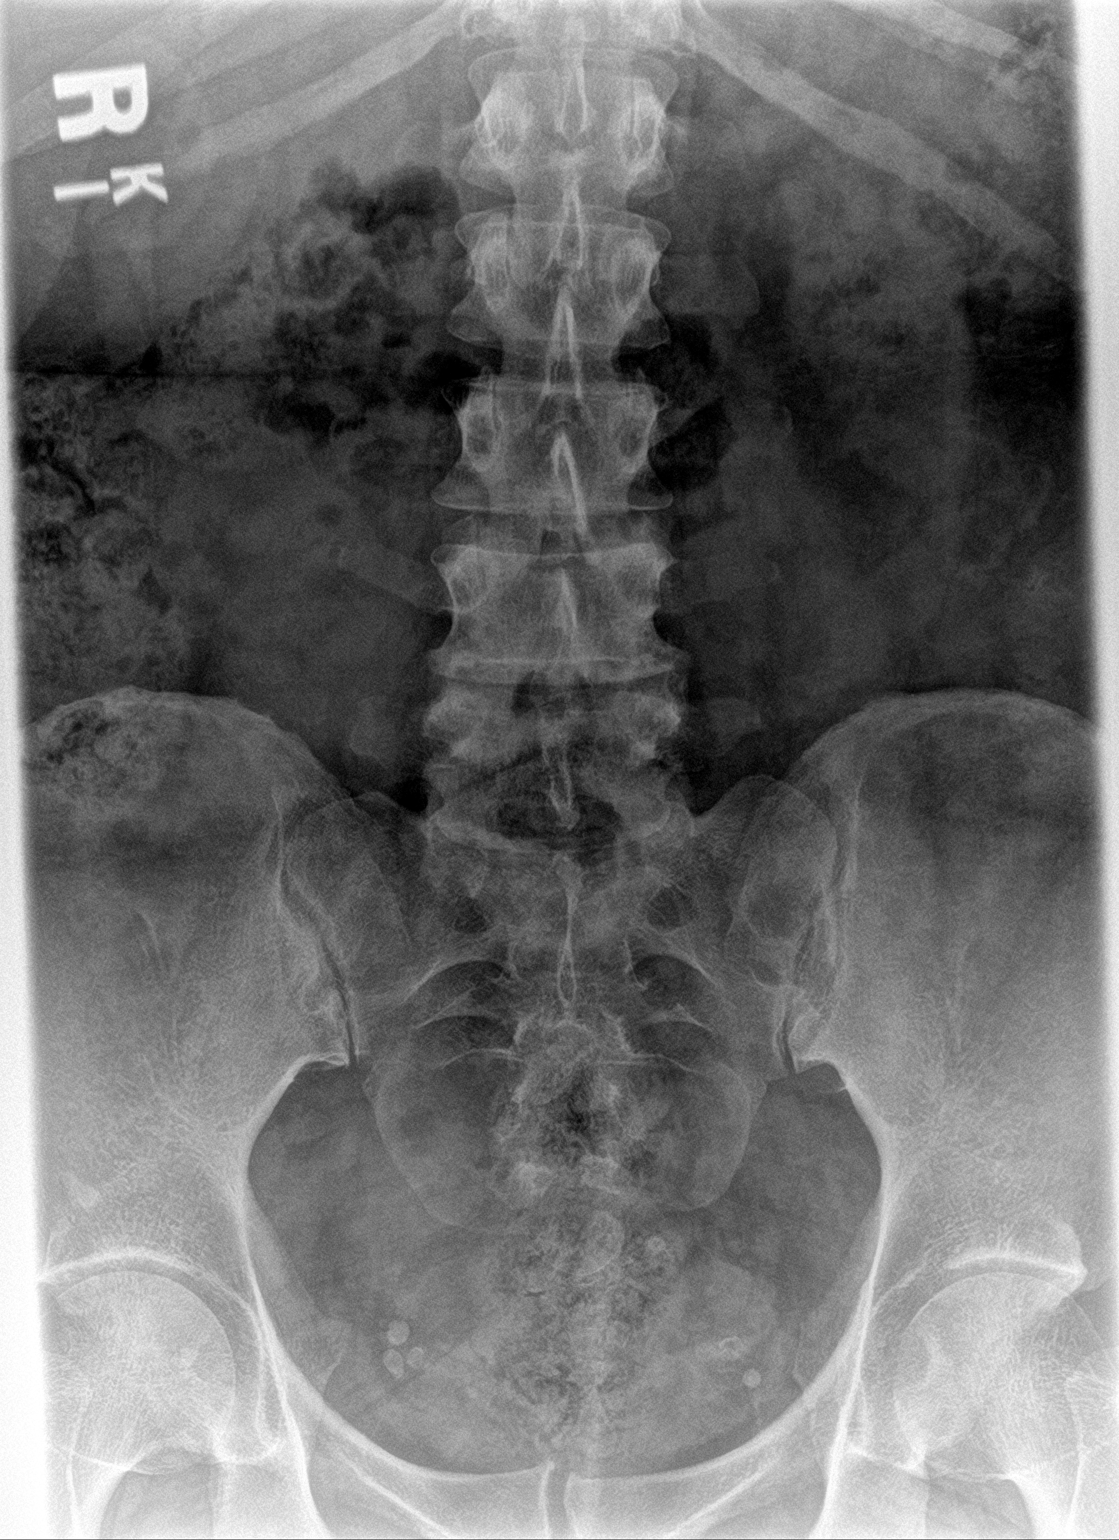

[l-spine lat]
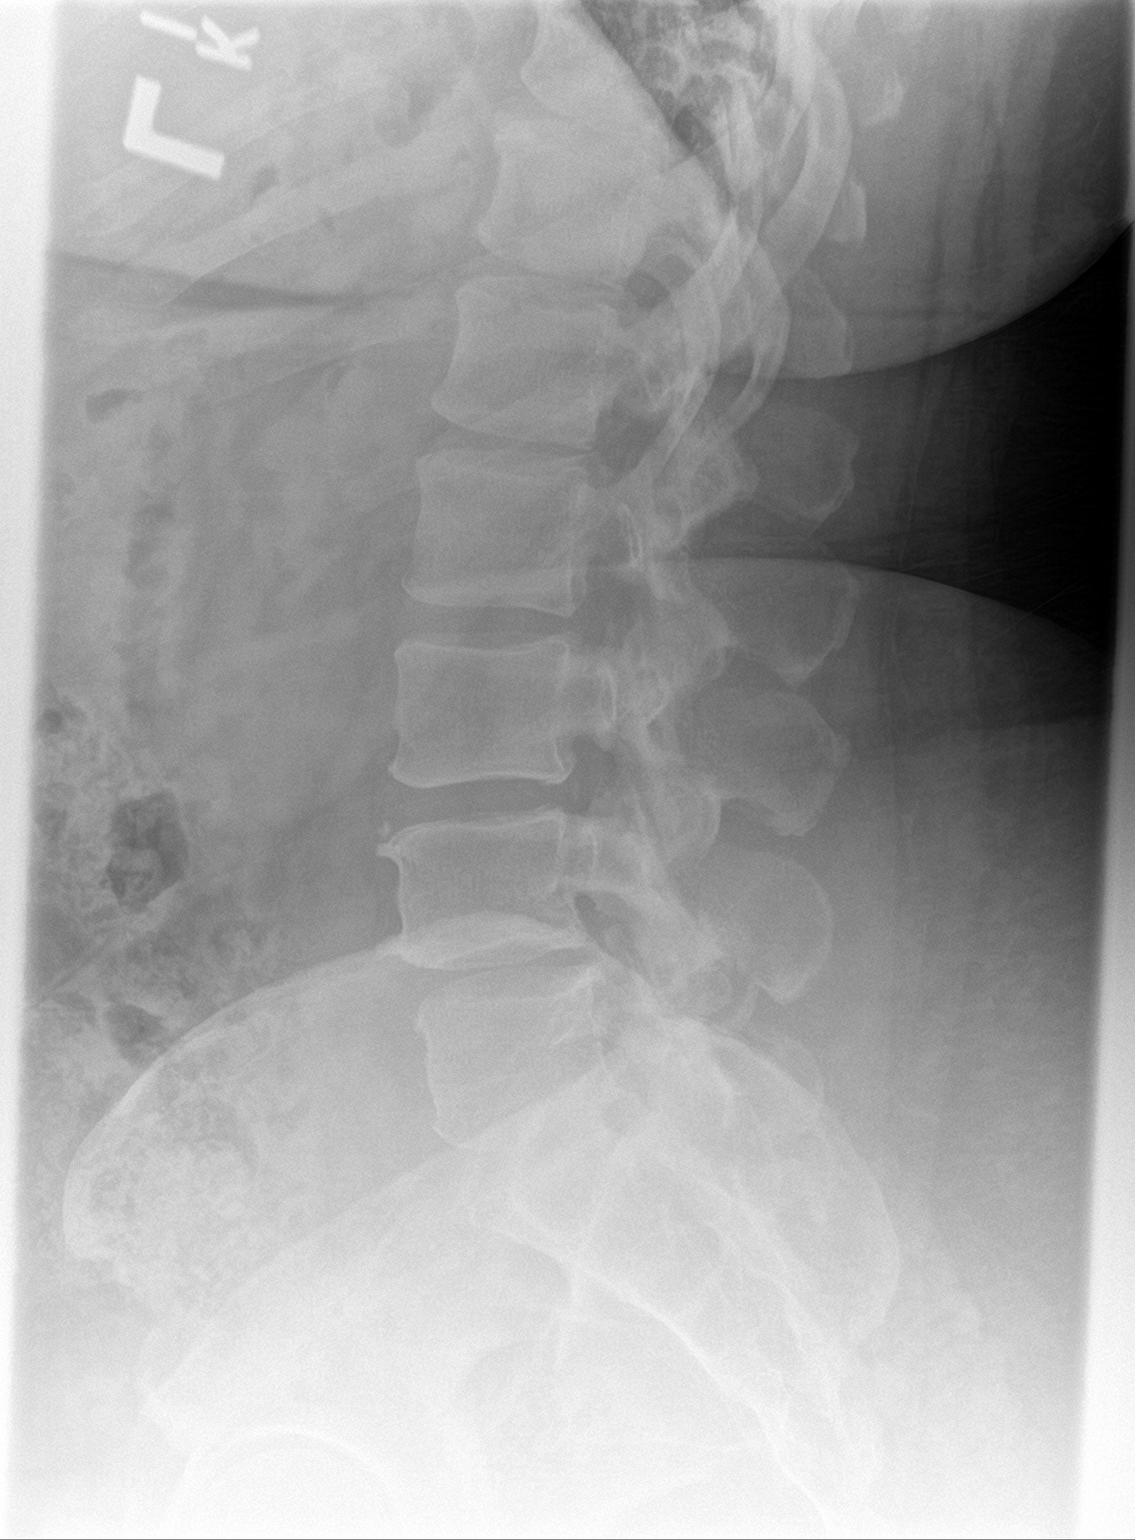

[l-spine spot]
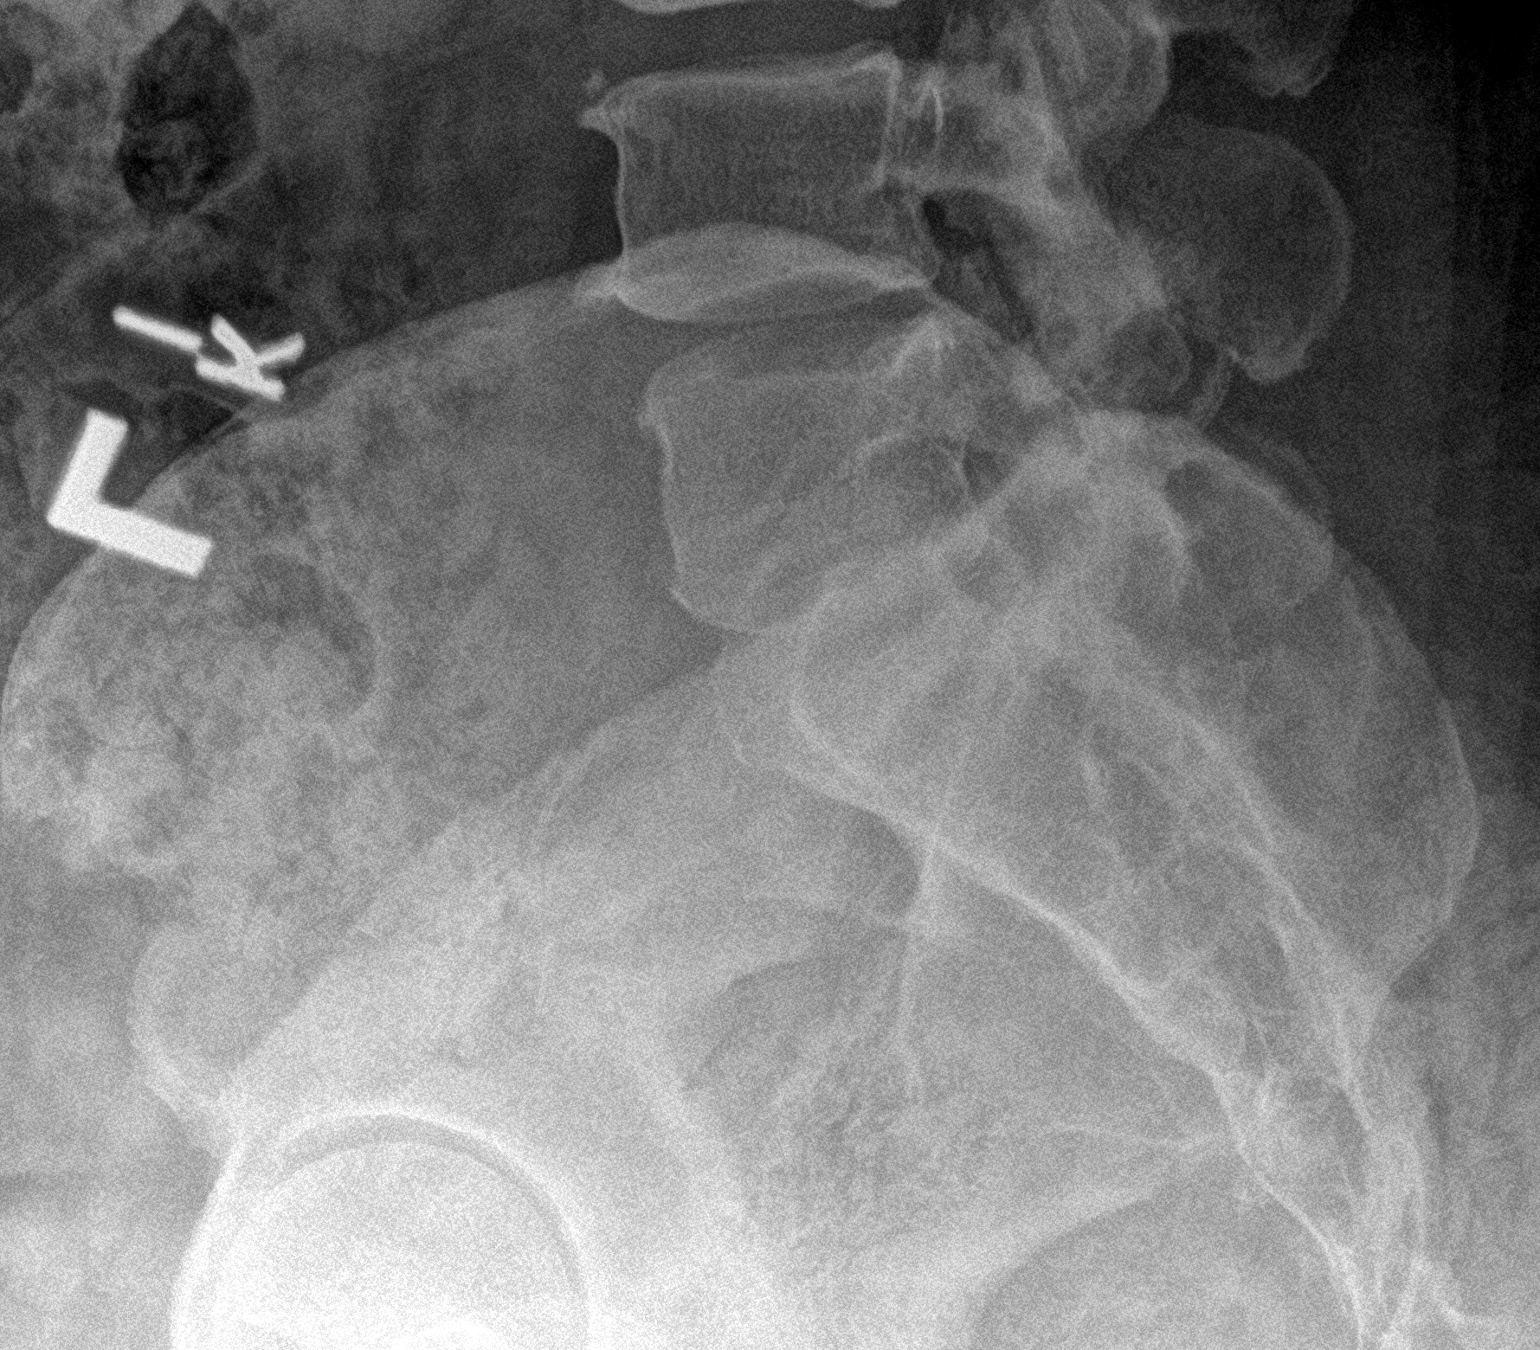

[3 of 3 positions shown; findings below may reference images not displayed]

FINDINGS: Five lumbar type vertebral bodies are well visualized. Vertebral
body height is well maintained. Mild osteophytic changes are noted.
Disc space narrowing is seen at L4-5 with very minimal
anterolisthesis of L4 on L5. No soft tissue changes are noted.
IMPRESSION: Mild degenerative change without acute abnormality.

## 2019-08-22 IMAGING — DX DG THORACIC SPINE 2V
3 series · 3 of 3 positions shown · non-contrast
Comparison: Chest radiograph 12/03/2017

CLINICAL DATA: 41-year-old with chronic bilateral thoracic pain.

EXAM:
THORACIC SPINE 2 VIEWS

[t-spine ap]
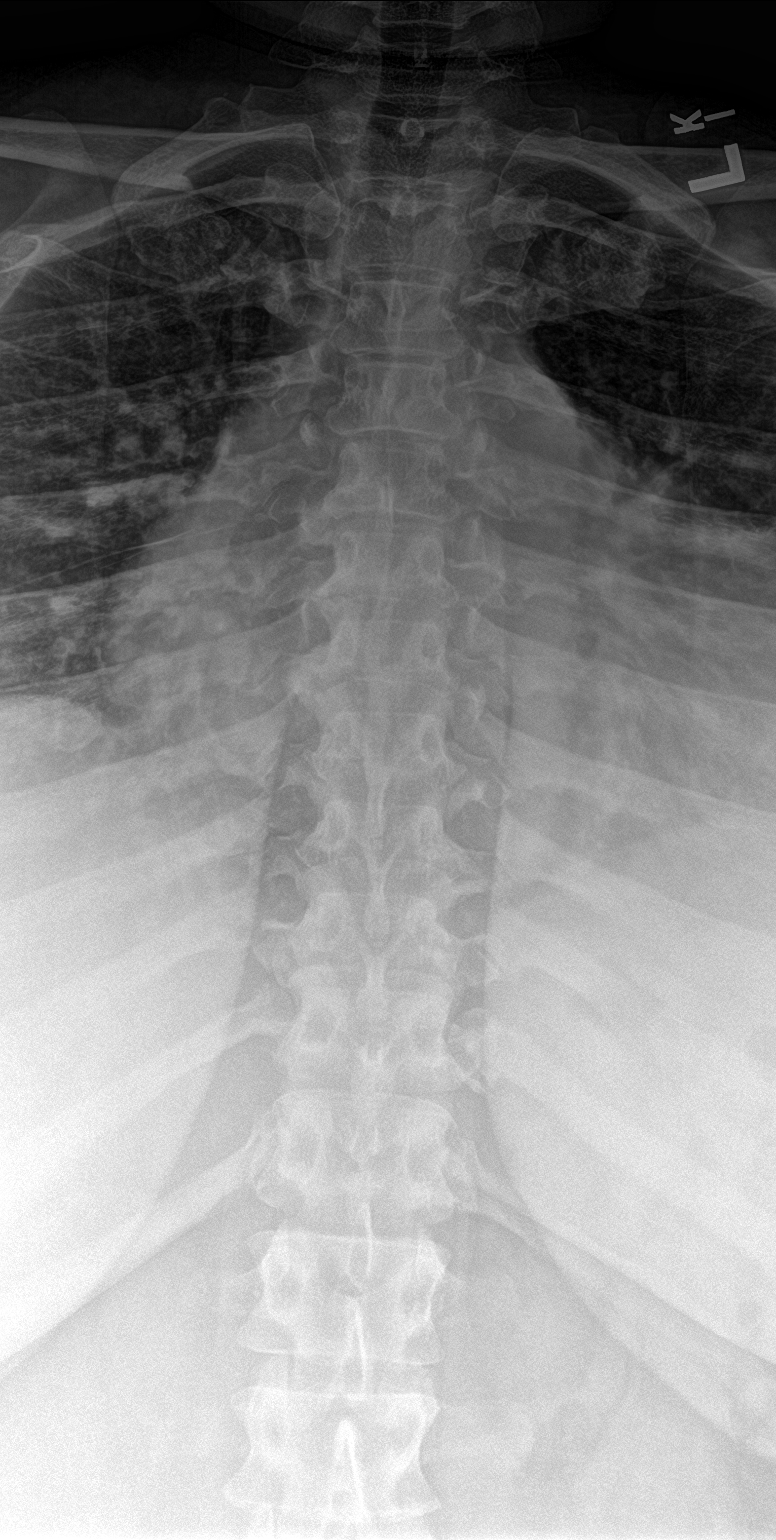

[t-spine lat]
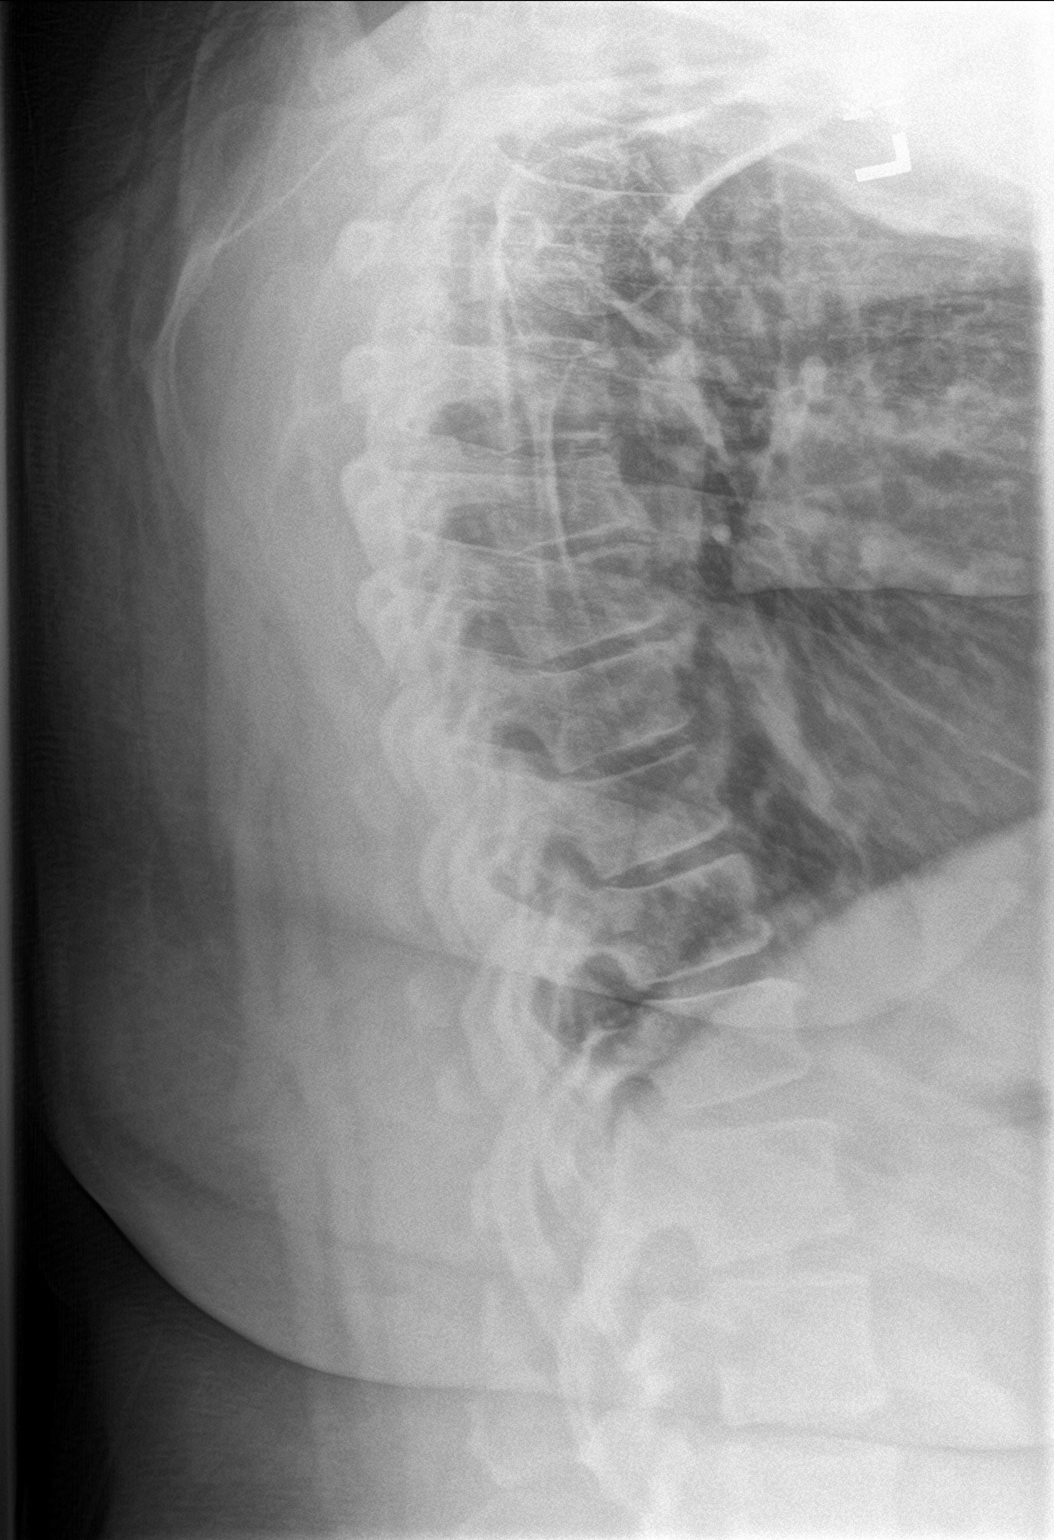

[swimmer]
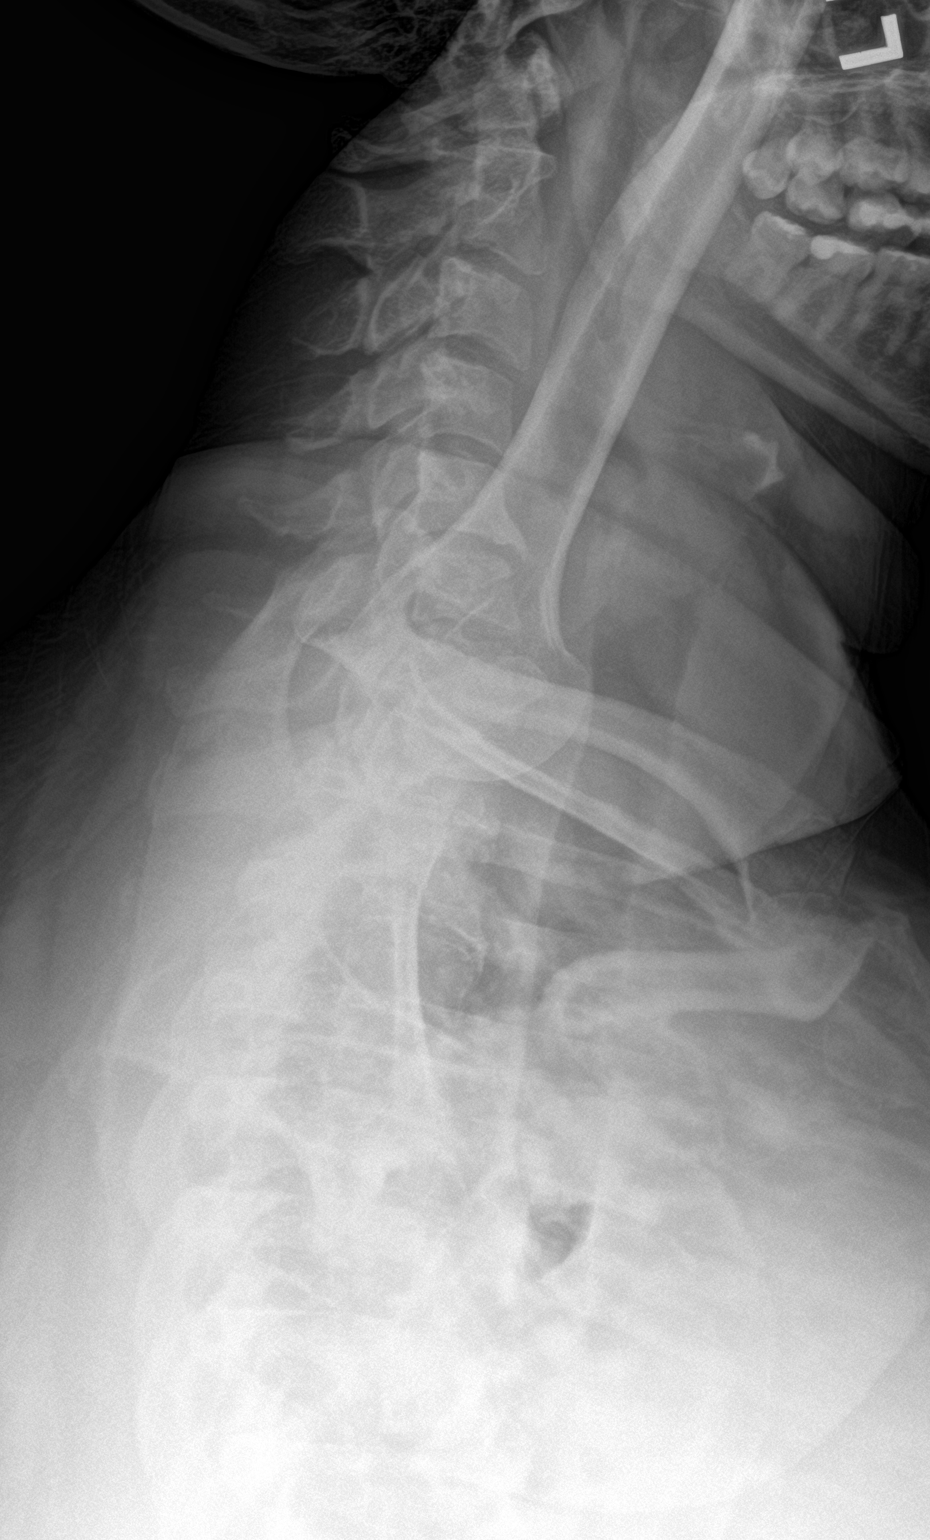

[3 of 3 positions shown; findings below may reference images not displayed]

FINDINGS: Mild degenerative disc and endplate disease at C5-C6. Normal
alignment at the cervicothoracic junction. Thoracic vertebral body
heights are maintained. Normal alignment in the thoracic spine.
Visualized ribs are intact.
IMPRESSION: No acute abnormality to the thoracic spine.

Mild degenerative disease in lower cervical spine.

## 2019-08-25 ENCOUNTER — Telehealth (INDEPENDENT_AMBULATORY_CARE_PROVIDER_SITE_OTHER): Payer: 59 | Admitting: Bariatrics

## 2019-08-25 ENCOUNTER — Other Ambulatory Visit: Payer: Self-pay

## 2019-08-25 ENCOUNTER — Encounter (INDEPENDENT_AMBULATORY_CARE_PROVIDER_SITE_OTHER): Payer: Self-pay | Admitting: Bariatrics

## 2019-08-25 DIAGNOSIS — I10 Essential (primary) hypertension: Secondary | ICD-10-CM | POA: Diagnosis not present

## 2019-08-25 DIAGNOSIS — Z6841 Body Mass Index (BMI) 40.0 and over, adult: Secondary | ICD-10-CM | POA: Diagnosis not present

## 2019-08-25 DIAGNOSIS — E559 Vitamin D deficiency, unspecified: Secondary | ICD-10-CM

## 2019-08-26 NOTE — Progress Notes (Signed)
Office: [phone]  /  Fax: [phone] TeleHealth Visit:  Kim Barnes has verbally consented to this TeleHealth visit today. The patient is located at home, the provider is located at the [facility] office. The participants in this visit include the listed provider and patient and any and all parties involved. The visit was conducted today via WebEx.  HPI:   Chief Complaint: OBESITY Kim Barnes is here to discuss her progress with her obesity treatment plan. She is on the Category 2 plan and is following her eating plan approximately 50 % of the time. She states she is exercising 0 minutes 0 times per week. Kim Barnes states that her weight remains the same. She is doing well with her water. Kim Barnes is doing some snacking. We were unable to weigh the patient today for this TeleHealth visit. She feels as if she has maintained weight since her last visit (weight not reported). She has lost 6 lbs since starting treatment with us.  Hypertension Kim Barnes is a 43 y.o. female with hypertension. She is taking Norvasc and Bystolic. Kim Barnes denies chest pain. She is working weight loss to help control her blood pressure with the goal of decreasing her risk of heart attack and stroke. Kim Barnes blood pressure has been well controlled.  Vitamin D deficiency Kim Barnes has a diagnosis of vitamin D deficiency. Kim Barnes is currently taking vit D and she denies nausea, vomiting or muscle weakness.  Pre-Diabetes Kim Barnes has a diagnosis of prediabetes based on her elevated Hgb A1c and was informed this puts her at greater risk of developing diabetes. Her last A1c was at 5.8 She is taking metformin currently and continues to work on diet and exercise to decrease risk of diabetes. She denies polyphagia.  ASSESSMENT AND PLAN:  Essential hypertension  Vitamin D deficiency  Class 3 severe obesity with serious comorbidity and body mass index (BMI) of 40.0 to 44.9 in adult, unspecified obesity  type (HCC)  PLAN:  Hypertension We discussed sodium restriction, working on healthy weight loss, and a regular exercise program as the means to achieve improved blood pressure control. Kim Barnes agreed with this plan and agreed to follow up as directed. We will continue to monitor her blood pressure as well as her progress with the above lifestyle modifications. She will continue her medications as prescribed and will watch for signs of hypotension as she continues her lifestyle modifications.  Vitamin D Deficiency Kim Barnes was informed that low vitamin D levels contributes to fatigue and are associated with obesity, breast, and colon cancer. She agrees to continue to take prescription Vit D @50 ,000 IU every week and will follow up for routine testing of vitamin D, at least 2-3 times per year. She was informed of the risk of over-replacement of vitamin D and agrees to not increase her dose unless she discusses this with us first.  Pre-Diabetes Kim Barnes will continue to work on weight loss, exercise, and decreasing simple carbohydrates in her diet to help decrease the risk of diabetes. We dicussed metformin including benefits and risks. She was informed that eating too many simple carbohydrates or too many calories at one sitting increases the likelihood of GI side effects. Kim Barnes will continue metformin for now and a prescription was not written today. Kim Barnes agreed to follow up with us as directed to monitor her progress.  Obesity Kim Barnes is currently in the action stage of change. As such, her goal is to continue with weight loss efforts She has agreed to follow  the Category 2 plan Kim Barnes will walk in the neighborhood for weight loss and overall health benefits. We discussed the following Behavioral Modification Strategies today: planning for success, increase H2O intake, no skipping meals, keeping healthy foods in the home, increasing lean protein intake, decreasing simple carbohydrates, increasing  vegetables, decrease eating out and work on meal planning and intentional eating Kim Barnes will adhere more to the plan and she will get 100 calorie snacks.  Kim Barnes has agreed to follow up with our clinic in 2 weeks. She was informed of the importance of frequent follow up visits to maximize her success with intensive lifestyle modifications for her multiple health conditions.  ALLERGIES: No Known Allergies  MEDICATIONS: Current Outpatient Medications on File Prior to Visit  Medication Sig Dispense Refill  . albuterol (PROAIR HFA) 108 (90 Base) MCG/ACT inhaler Inhale 2 puffs into the lungs every 4 (four) hours as needed for wheezing or shortness of breath. 18 g 3  . ALPRAZolam (NIRAVAM) 0.5 MG dissolvable tablet Take 1 tablet (0.5 mg total) by mouth 2 (two) times daily as needed for anxiety. 10 tablet 0  . amLODipine (NORVASC) 5 MG tablet Take 1 tablet (5 mg total) by mouth daily. 30 tablet 0  . benzonatate (TESSALON) 200 MG capsule Take 1 capsule (200 mg total) by mouth 2 (two) times daily as needed for cough. 30 capsule 1  . budesonide-formoterol (SYMBICORT) 160-4.5 MCG/ACT inhaler Inhale 2 puffs into the lungs 2 (two) times daily. 3 Inhaler 3  . buPROPion (WELLBUTRIN SR) 200 MG 12 hr tablet Take 1 tablet (200 mg total) by mouth daily. 30 tablet 0  . BYSTOLIC 20 MG TABS TAKE 1 TABLET BY MOUTH EVERY DAY 30 tablet 11  . ibuprofen (ADVIL,MOTRIN) 200 MG tablet Take 1,200 mg by mouth daily as needed for moderate pain (tooth pain).    . metFORMIN (GLUCOPHAGE) 500 MG tablet Take 1 tablet (500 mg total) by mouth daily with breakfast. 30 tablet 0  . triamterene-hydrochlorothiazide (MAXZIDE-25) 37.5-25 MG tablet Take 1 tablet by mouth daily. 30 tablet 11  . Vitamin D, Ergocalciferol, (DRISDOL) 1.25 MG (50000 UT) CAPS capsule TAKE ONE CAPSULE BY MOUTH EVERY 7 DAYS 4 capsule 0   No current facility-administered medications on file prior to visit.     PAST MEDICAL HISTORY: Past Medical History:   Diagnosis Date  . Anxiety   . Asthma    prn inhaler  . Back pain   . Bell's palsy   . Carpal tunnel syndrome of left wrist 01/2013  . Chest pain   . Cold hands and feet   . Cough   . Depression   . Dry mouth   . Dry skin   . Fatigue   . GERD (gastroesophageal reflux disease)    occasional; no current med.  [name] History of brain tumor 1996   no neurologic deficits  . HTN (hypertension)    under control with med., has been on med. x 5 yr.  . Joint pain   . Obesity   . Obstructive sleep apnea   . PCOS (polycystic ovarian syndrome)   . Polycystic ovarian syndrome    no current med.; irregular periods  . Shortness of breath   . Stress   . Swelling of extremity   . Wheezing     PAST SURGICAL HISTORY: Past Surgical History:  Procedure Laterality Date  . BRAIN SURGERY  1996   exc. tumor  . CARPAL TUNNEL RELEASE Left 02/10/2013   Procedure: CARPAL TUNNEL RELEASE;  Surgeon: Nicki ReaperGary R Kuzma, MD;  Location: [facility];  Service: Orthopedics;  Laterality: Left;  ANESTHESIA: IV REGIONAL FAB  . HYSTEROSCOPY W/D&C  02/21/2006  . HYSTEROSCOPY W/D&C  12/17/2011   Procedure: DILATATION AND CURETTAGE /HYSTEROSCOPY;  Surgeon: Meriel Picaichard M Holland, MD;  Location: [facility];  Service: Gynecology;  Laterality: N/A;  . LEEP  01/07/2004  . TONSILLECTOMY  09/21/2002    SOCIAL HISTORY: Social History   Tobacco Use  . Smoking status: Never Smoker  . Smokeless tobacco: Never Used  Substance Use Topics  . Alcohol use: Yes    Comment: rare; socially  . Drug use: No    FAMILY HISTORY: Family History  Problem Relation Age of Onset  . Hypertension Other   . Lung cancer Father        Smoker  . Cancer Father        lung/ smoker  . Heart disease Father   . Alcoholism Father   . Breast cancer Maternal Grandmother   . Cancer Maternal Grandmother        breast  . Emphysema Paternal Grandmother   . Asthma Mother   . Hyperlipidemia Mother   . Hypertension Mother   . Sleep apnea Mother    . Asthma Maternal Aunt   . Breast cancer Maternal Aunt        brain   . Breast cancer Paternal Aunt        prostate    ROS: Review of Systems  Constitutional: Negative for weight loss.  Cardiovascular: Negative for chest pain.  Gastrointestinal: Negative for nausea and vomiting.  Musculoskeletal:       Negative for muscle weakness  Endo/Heme/Allergies:       Negative for polyphagia    PHYSICAL EXAM: Pt in no acute distress  RECENT LABS AND TESTS: BMET    Component Value Date/Time   NA 147 (H) 03/10/2019 0932   K 4.3 03/10/2019 0932   CL 104 03/10/2019 0932   CO2 26 03/10/2019 0932   GLUCOSE 80 03/10/2019 0932   GLUCOSE 103 (H) 12/31/2017 0926   GLUCOSE 83 10/08/2006 1418   BUN 12 03/10/2019 0932   CREATININE 0.79 03/10/2019 0932   CALCIUM 9.7 03/10/2019 0932   GFRNONAA 94 03/10/2019 0932   GFRAA 108 03/10/2019 0932   Lab Results  Component Value Date   HGBA1C 5.8 (H) 03/10/2019   HGBA1C 5.7 (H) 11/11/2018   HGBA1C 5.7 (H) 07/28/2018   HGBA1C 5.8 (H) 02/26/2018   HGBA1C 5.3 03/31/2015   Lab Results  Component Value Date   INSULIN 34.6 (H) 03/10/2019   INSULIN 26.3 (H) 11/11/2018   INSULIN 14.8 07/28/2018   INSULIN 26.4 (H) 02/26/2018   CBC    Component Value Date/Time   WBC 5.4 02/26/2018 0936   WBC 5.7 12/31/2017 0926   RBC 4.74 02/26/2018 0936   RBC 4.82 12/31/2017 0926   HGB 13.6 02/26/2018 0936   HCT 42.2 02/26/2018 0936   PLT 281.0 12/31/2017 0926   MCV 89 02/26/2018 0936   MCH 28.7 02/26/2018 0936   MCH 29.3 02/04/2017 1851   MCHC 32.2 02/26/2018 0936   MCHC 33.8 12/31/2017 0926   RDW 14.7 02/26/2018 0936   LYMPHSABS 2.1 02/26/2018 0936   MONOABS 0.3 12/31/2017 0926   EOSABS 0.1 02/26/2018 0936   BASOSABS 0.0 02/26/2018 0936   Iron/TIBC/Ferritin/ %Sat No results found for: IRON, TIBC, FERRITIN, IRONPCTSAT Lipid Panel     Component Value Date/Time   CHOL 216 (H) 03/10/2019 0932  TRIG 81 03/10/2019 0932   HDL 55 03/10/2019 0932    CHOLHDL 4 12/31/2017 0926   VLDL 17.2 12/31/2017 0926   LDLCALC 145 (H) 03/10/2019 0932   LDLDIRECT 149.4 05/24/2010 0904   Hepatic Function Panel     Component Value Date/Time   PROT 7.6 03/10/2019 0932   ALBUMIN 4.4 03/10/2019 0932   AST 20 03/10/2019 0932   ALT 17 03/10/2019 0932   ALKPHOS 67 03/10/2019 0932   BILITOT 0.4 03/10/2019 0932   BILIDIR 0.0 12/31/2017 0926      Component Value Date/Time   TSH 3.240 03/10/2019 0932   TSH 2.540 02/26/2018 0936   TSH 1.78 12/31/2017 0926     Ref. Range 03/10/2019 09:32  Vitamin D, 25-Hydroxy Latest Ref Range: 30.0 - 100.0 ng/mL 19.6 (L)    I, Nevada Crane, am acting as Energy manager for [employer]. Manson Passey, DO  I have reviewed the above documentation for accuracy and completeness, and I agree with the above. -Corinna Capra, DO

## 2019-09-03 ENCOUNTER — Other Ambulatory Visit (INDEPENDENT_AMBULATORY_CARE_PROVIDER_SITE_OTHER): Payer: Self-pay | Admitting: Bariatrics

## 2019-09-03 DIAGNOSIS — I10 Essential (primary) hypertension: Secondary | ICD-10-CM

## 2019-09-08 ENCOUNTER — Encounter (INDEPENDENT_AMBULATORY_CARE_PROVIDER_SITE_OTHER): Payer: Self-pay | Admitting: Bariatrics

## 2019-09-08 ENCOUNTER — Ambulatory Visit (INDEPENDENT_AMBULATORY_CARE_PROVIDER_SITE_OTHER): Payer: 59 | Admitting: Bariatrics

## 2019-09-08 ENCOUNTER — Other Ambulatory Visit: Payer: Self-pay

## 2019-09-08 VITALS — BP 148/84 | HR 70 | Temp 98.3°F | Ht 66.0 in | Wt 376.0 lb

## 2019-09-08 DIAGNOSIS — I10 Essential (primary) hypertension: Secondary | ICD-10-CM

## 2019-09-08 DIAGNOSIS — R7303 Prediabetes: Secondary | ICD-10-CM

## 2019-09-08 DIAGNOSIS — E66813 Obesity, class 3: Secondary | ICD-10-CM

## 2019-09-08 DIAGNOSIS — Z6841 Body Mass Index (BMI) 40.0 and over, adult: Secondary | ICD-10-CM

## 2019-09-08 NOTE — Progress Notes (Signed)
Office: [phone]  /  Fax: [phone]   HPI:   Chief Complaint: OBESITY Kim Barnes is here to discuss her progress with her obesity treatment plan. She is on the Category 2 plan and is following her eating plan approximately 30% of the time. She states she is exercising 0 minutes 0 times per week. Kim Barnes is up 4 lbs. She is considering Roux-en-Y gastric bypass surgery and has a consultation with [facility] (Dr. Beckey Rutter). She states she is doing well with her water intake. Her weight is (!) 376 lb (170.6 kg) today and has had a weight gain of 4 lbs since her last visit. She has lost 2 lbs since starting treatment with [name].  Hypertension Kim Barnes is a 43 y.o. female with hypertension and is taking Norvasc and Maxzide.  Kim Barnes denies chest pain or shortness of breath on exertion. She is working weight loss to help control her blood pressure with the goal of decreasing her risk of heart attack and stroke. Kim Barnes's blood pressure is well controlled.  Pre-Diabetes Kim Barnes has a diagnosis of prediabetes based on her elevated Hgb A1c and was informed this puts her at greater risk of developing diabetes. She is taking metformin currently and continues to work on diet and exercise to decrease risk of diabetes. She denies nausea or hypoglycemia.  ASSESSMENT AND PLAN:  Essential hypertension  Prediabetes  Class 3 severe obesity with serious comorbidity and body mass index (BMI) of 60.0 to 69.9 in adult, unspecified obesity type (HCC)  PLAN:  Hypertension We discussed sodium restriction, working on healthy weight loss, and a regular exercise program as the means to achieve improved blood pressure control. Sefora agreed with this plan and agreed to follow up as directed. We will continue to monitor her blood pressure as well as her progress with the above lifestyle modifications. She will continue her medications as prescribed and will watch for signs of hypotension as she continues  her lifestyle modifications.  Pre-Diabetes Kim Barnes will continue to work on weight loss, exercise, and decreasing simple carbohydrates in her diet to help decrease the risk of diabetes. We dicussed metformin including benefits and risks. She was informed that eating too many simple carbohydrates or too many calories at one sitting increases the likelihood of GI side effects. Kim Barnes will continue medications and follow-up with [name] as directed to monitor her progress.  I spent > than 50% of the 15 minute visit on counseling as documented in the note.  Obesity Kim Barnes is currently in the action stage of change. As such, her goal is to continue with weight loss efforts. She has agreed to follow the Category 2 plan. Kim Barnes will work on meal planning, intentional eating, increasing protein and vegetables, and will not skip meals. Kim Barnes has been instructed to work up to a goal of 150 minutes of combined cardio and strengthening exercise per week for weight loss and overall health benefits. We discussed the following Behavioral Modification Strategies today: increasing lean protein intake, decreasing simple carbohydrates, increasing vegetables, increase H20 intake, decrease eating out, no skipping meals, work on meal planning and easy cooking plans, keeping healthy foods in the home, and planning for success.  Kim Barnes has agreed to follow-up with our clinic in 4-6 weeks - planning bariatric surgery. She was informed of the importance of frequent follow-up visits to maximize her success with intensive lifestyle modifications for her multiple health conditions.  ALLERGIES: No Known Allergies  MEDICATIONS: Current Outpatient Medications on File Prior to Visit  Medication Sig Dispense Refill  . albuterol (PROAIR HFA) 108 (90 Base) MCG/ACT inhaler Inhale 2 puffs into the lungs every 4 (four) hours as needed for wheezing or shortness of breath. 18 g 3  . ALPRAZolam (NIRAVAM) 0.5 MG dissolvable tablet Take 1  tablet (0.5 mg total) by mouth 2 (two) times daily as needed for anxiety. 10 tablet 0  . amLODipine (NORVASC) 5 MG tablet TAKE 1 TABLET BY MOUTH EVERY DAY 30 tablet 0  . benzonatate (TESSALON) 200 MG capsule Take 1 capsule (200 mg total) by mouth 2 (two) times daily as needed for cough. 30 capsule 1  . budesonide-formoterol (SYMBICORT) 160-4.5 MCG/ACT inhaler Inhale 2 puffs into the lungs 2 (two) times daily. 3 Inhaler 3  . buPROPion (WELLBUTRIN SR) 200 MG 12 hr tablet Take 1 tablet (200 mg total) by mouth daily. 30 tablet 0  . BYSTOLIC 20 MG TABS TAKE 1 TABLET BY MOUTH EVERY DAY 30 tablet 11  . ibuprofen (ADVIL,MOTRIN) 200 MG tablet Take 1,200 mg by mouth daily as needed for moderate pain (tooth pain).    . metFORMIN (GLUCOPHAGE) 500 MG tablet Take 1 tablet (500 mg total) by mouth daily with breakfast. 30 tablet 0  . Vitamin D, Ergocalciferol, (DRISDOL) 1.25 MG (50000 UT) CAPS capsule TAKE ONE CAPSULE BY MOUTH EVERY 7 DAYS 4 capsule 0  . triamterene-hydrochlorothiazide (MAXZIDE-25) 37.5-25 MG tablet Take 1 tablet by mouth daily. 30 tablet 11   No current facility-administered medications on file prior to visit.     PAST MEDICAL HISTORY: Past Medical History:  Diagnosis Date  . Anxiety   . Asthma    prn inhaler  . Back pain   . Bell's palsy   . Carpal tunnel syndrome of left wrist 01/2013  . Chest pain   . Cold hands and feet   . Cough   . Depression   . Dry mouth   . Dry skin   . Fatigue   . GERD (gastroesophageal reflux disease)    occasional; no current med.  [name]. History of brain tumor 1996   no neurologic deficits  . HTN (hypertension)    under control with med., has been on med. x 5 yr.  . Joint pain   . Obesity   . Obstructive sleep apnea   . PCOS (polycystic ovarian syndrome)   . Polycystic ovarian syndrome    no current med.; irregular periods  . Shortness of breath   . Stress   . Swelling of extremity   . Wheezing     PAST SURGICAL HISTORY: Past Surgical  History:  Procedure Laterality Date  . BRAIN SURGERY  1996   exc. tumor  . CARPAL TUNNEL RELEASE Left 02/10/2013   Procedure: CARPAL TUNNEL RELEASE;  Surgeon: Nicki ReaperGary R Kuzma, MD;  Location: [facility];  Service: Orthopedics;  Laterality: Left;  ANESTHESIA: IV REGIONAL FAB  . HYSTEROSCOPY W/D&C  02/21/2006  . HYSTEROSCOPY W/D&C  12/17/2011   Procedure: DILATATION AND CURETTAGE /HYSTEROSCOPY;  Surgeon: Meriel Picaichard M Holland, MD;  Location: [facility];  Service: Gynecology;  Laterality: N/A;  . LEEP  01/07/2004  . TONSILLECTOMY  09/21/2002    SOCIAL HISTORY: Social History   Tobacco Use  . Smoking status: Never Smoker  . Smokeless tobacco: Never Used  Substance Use Topics  . Alcohol use: Yes    Comment: rare; socially  . Drug use: No    FAMILY HISTORY: Family History  Problem Relation Age of Onset  . Hypertension Other   .  Lung cancer Father        Smoker  . Cancer Father        lung/ smoker  . Heart disease Father   . Alcoholism Father   . Breast cancer Maternal Grandmother   . Cancer Maternal Grandmother        breast  . Emphysema Paternal Grandmother   . Asthma Mother   . Hyperlipidemia Mother   . Hypertension Mother   . Sleep apnea Mother   . Asthma Maternal Aunt   . Breast cancer Maternal Aunt        brain   . Breast cancer Paternal Aunt        prostate   ROS: Review of Systems  Respiratory: Negative for shortness of breath.   Cardiovascular: Negative for chest pain.  Gastrointestinal: Negative for nausea.  Endo/Heme/Allergies:       Negative for hypoglycemia.   PHYSICAL EXAM: Blood pressure (!) 148/84, pulse 70, temperature 98.3 F (36.8 C), temperature source Oral, height 5\' 6"  (1.676 m), weight (!) 376 lb (170.6 kg), SpO2 96 %. Body mass index is 60.69 kg/m. Physical Exam Vitals signs reviewed.  Constitutional:      Appearance: Normal appearance. She is obese.  Cardiovascular:     Rate and Rhythm: Normal rate.     Pulses: Normal pulses.   Pulmonary:     Effort: Pulmonary effort is normal.     Breath sounds: Normal breath sounds.  Musculoskeletal: Normal range of motion.  Skin:    General: Skin is warm and dry.  Neurological:     Mental Status: She is alert and oriented to person, place, and time.  Psychiatric:        Behavior: Behavior normal.   RECENT LABS AND TESTS: BMET    Component Value Date/Time   NA 147 (H) 03/10/2019 0932   K 4.3 03/10/2019 0932   CL 104 03/10/2019 0932   CO2 26 03/10/2019 0932   GLUCOSE 80 03/10/2019 0932   GLUCOSE 103 (H) 12/31/2017 0926   GLUCOSE 83 10/08/2006 1418   BUN 12 03/10/2019 0932   CREATININE 0.79 03/10/2019 0932   CALCIUM 9.7 03/10/2019 0932   GFRNONAA 94 03/10/2019 0932   GFRAA 108 03/10/2019 0932   Lab Results  Component Value Date   HGBA1C 5.8 (H) 03/10/2019   HGBA1C 5.7 (H) 11/11/2018   HGBA1C 5.7 (H) 07/28/2018   HGBA1C 5.8 (H) 02/26/2018   HGBA1C 5.3 03/31/2015   Lab Results  Component Value Date   INSULIN 34.6 (H) 03/10/2019   INSULIN 26.3 (H) 11/11/2018   INSULIN 14.8 07/28/2018   INSULIN 26.4 (H) 02/26/2018   CBC    Component Value Date/Time   WBC 5.4 02/26/2018 0936   WBC 5.7 12/31/2017 0926   RBC 4.74 02/26/2018 0936   RBC 4.82 12/31/2017 0926   HGB 13.6 02/26/2018 0936   HCT 42.2 02/26/2018 0936   PLT 281.0 12/31/2017 0926   MCV 89 02/26/2018 0936   MCH 28.7 02/26/2018 0936   MCH 29.3 02/04/2017 1851   MCHC 32.2 02/26/2018 0936   MCHC 33.8 12/31/2017 0926   RDW 14.7 02/26/2018 0936   LYMPHSABS 2.1 02/26/2018 0936   MONOABS 0.3 12/31/2017 0926   EOSABS 0.1 02/26/2018 0936   BASOSABS 0.0 02/26/2018 0936   Iron/TIBC/Ferritin/ %Sat No results found for: IRON, TIBC, FERRITIN, IRONPCTSAT Lipid Panel     Component Value Date/Time   CHOL 216 (H) 03/10/2019 0932   TRIG 81 03/10/2019 0932   HDL 55 03/10/2019  0932   CHOLHDL 4 12/31/2017 0926   VLDL 17.2 12/31/2017 0926   LDLCALC 145 (H) 03/10/2019 0932   LDLDIRECT 149.4 05/24/2010 0904    Hepatic Function Panel     Component Value Date/Time   PROT 7.6 03/10/2019 0932   ALBUMIN 4.4 03/10/2019 0932   AST 20 03/10/2019 0932   ALT 17 03/10/2019 0932   ALKPHOS 67 03/10/2019 0932   BILITOT 0.4 03/10/2019 0932   BILIDIR 0.0 12/31/2017 0926      Component Value Date/Time   TSH 3.240 03/10/2019 0932   TSH 2.540 02/26/2018 0936   TSH 1.78 12/31/2017 0926   Results for AVEYA, BEAL (MRN [id number]) as of 09/08/2019 12:28  Ref. Range 03/10/2019 09:32  Vitamin D, 25-Hydroxy Latest Ref Range: 30.0 - 100.0 ng/mL 19.6 (L)   OBESITY BEHAVIORAL INTERVENTION VISIT  Today's visit was #37  Starting weight: 378 lbs Starting date: 02/26/2018 Today's weight: 376 lbs  Today's date: 09/08/2019 Total lbs lost to date: 2    09/08/2019  Height 5\' 6"  (1.676 m)  Weight 376 lb (170.6 kg) (A)  BMI (Calculated) 60.72  BLOOD PRESSURE - SYSTOLIC 148  BLOOD PRESSURE - DIASTOLIC 84   Body Fat % 58.6 %   ASK: We discussed the diagnosis of obesity with Kim Barnes today and Kim Barnes agreed to give permission to discuss obesity behavioral modification therapy today.  ASSESS: Kim Barnes has the diagnosis of obesity and her BMI today is 60.8. Kim Barnes is in the action stage of change.   ADVISE: Kim Barnes was educated on the multiple health risks of obesity as well as the benefit of weight loss to improve her health. She was advised of the need for long term treatment and the importance of lifestyle modifications to improve her current health and to decrease her risk of future health problems.  AGREE: Multiple dietary modification options and treatment options were discussed and  Shelie agreed to follow the recommendations documented in the above note.  ARRANGE: Kim Barnes was educated on the importance of frequent visits to treat obesity as outlined per CMS and USPSTF guidelines and agreed to schedule her next follow up appointment today.  Jimmey Ralph, am acting as Fernanda Drum for Energy manager, DO  I have reviewed the above documentation for accuracy and completeness, and I agree with the above. -[employer], DO

## 2019-09-28 ENCOUNTER — Encounter: Payer: 59 | Admitting: Obstetrics & Gynecology

## 2019-09-29 ENCOUNTER — Ambulatory Visit
Admission: RE | Admit: 2019-09-29 | Discharge: 2019-09-29 | Disposition: A | Discharge status: Home / Self Care | Payer: 59 | Source: Ambulatory Visit | Attending: Obstetrics & Gynecology | Admitting: Obstetrics & Gynecology

## 2019-09-29 ENCOUNTER — Other Ambulatory Visit: Payer: Self-pay

## 2019-09-29 ENCOUNTER — Other Ambulatory Visit: Payer: Self-pay | Admitting: Obstetrics & Gynecology

## 2019-09-29 DIAGNOSIS — Z1231 Encounter for screening mammogram for malignant neoplasm of breast: Secondary | ICD-10-CM

## 2019-09-30 ENCOUNTER — Other Ambulatory Visit: Payer: Self-pay | Admitting: Obstetrics & Gynecology

## 2019-09-30 DIAGNOSIS — R928 Other abnormal and inconclusive findings on diagnostic imaging of breast: Secondary | ICD-10-CM

## 2019-10-02 ENCOUNTER — Other Ambulatory Visit: Payer: Self-pay

## 2019-10-02 ENCOUNTER — Ambulatory Visit
Admission: RE | Admit: 2019-10-02 | Discharge: 2019-10-02 | Disposition: A | Discharge status: Home / Self Care | Payer: 59 | Source: Ambulatory Visit | Attending: Obstetrics & Gynecology | Admitting: Obstetrics & Gynecology

## 2019-10-02 ENCOUNTER — Other Ambulatory Visit: Payer: Self-pay | Admitting: Obstetrics & Gynecology

## 2019-10-02 DIAGNOSIS — R928 Other abnormal and inconclusive findings on diagnostic imaging of breast: Secondary | ICD-10-CM

## 2019-10-02 DIAGNOSIS — N6310 Unspecified lump in the right breast, unspecified quadrant: Secondary | ICD-10-CM

## 2019-10-06 ENCOUNTER — Encounter (INDEPENDENT_AMBULATORY_CARE_PROVIDER_SITE_OTHER): Payer: Self-pay | Admitting: Bariatrics

## 2019-10-06 ENCOUNTER — Other Ambulatory Visit: Payer: Self-pay

## 2019-10-06 ENCOUNTER — Ambulatory Visit (INDEPENDENT_AMBULATORY_CARE_PROVIDER_SITE_OTHER): Payer: 59 | Admitting: Bariatrics

## 2019-10-06 VITALS — BP 127/84 | HR 70 | Temp 98.7°F | Ht 66.0 in | Wt 373.0 lb

## 2019-10-06 DIAGNOSIS — Z6841 Body Mass Index (BMI) 40.0 and over, adult: Secondary | ICD-10-CM

## 2019-10-06 DIAGNOSIS — Z9189 Other specified personal risk factors, not elsewhere classified: Secondary | ICD-10-CM

## 2019-10-06 DIAGNOSIS — R7303 Prediabetes: Secondary | ICD-10-CM

## 2019-10-06 DIAGNOSIS — E559 Vitamin D deficiency, unspecified: Secondary | ICD-10-CM

## 2019-10-06 DIAGNOSIS — I10 Essential (primary) hypertension: Secondary | ICD-10-CM | POA: Diagnosis not present

## 2019-10-06 MED ORDER — AMLODIPINE BESYLATE 5 MG PO TABS: 5.0000 mg | ORAL_TABLET | Freq: Every day | ORAL | 0 refills | Status: DC

## 2019-10-06 MED ORDER — VITAMIN D (ERGOCALCIFEROL) 1.25 MG (50000 UNIT) PO CAPS: ORAL_CAPSULE | ORAL | 0 refills | Status: DC

## 2019-10-06 NOTE — Progress Notes (Signed)
Office: [phone]  /  Fax: [phone]   HPI:   Chief Complaint: OBESITY Kim Barnes is here to discuss her progress with her obesity treatment plan. She is on the Category 2 plan and is following her eating plan approximately 0% of the time. She states she is walking/weights 30 minutes 5 times per week. Kim Barnes is down 3 lbs. She is doing well with water and protein.  Her weight is (!) 373 lb (169.2 kg) today and has had a weight loss of 3 pounds over a period of 4 weeks since her last visit. She has lost 5 lbs since starting treatment with Kim Barnes.  Pre-Diabetes Kim Barnes has a diagnosis of prediabetes based on her elevated Hgb A1c and was informed this puts her at greater risk of developing diabetes. Last A1c 5.8 on 03/10/2019 with an insulin of 34.6. She is taking metformin currently and continues to work on diet and exercise to decrease risk of diabetes. She denies nausea or hypoglycemia. No polyphagia.  At risk for diabetes Kim Barnes is at higher than average risk for developing diabetes due to her obesity. She currently denies polyuria or polydipsia.  Hypertension Kim Barnes is a 43 y.o. female with hypertension. Kim Barnes denies chest pain or shortness of breath on exertion. She is working weight loss to help control her blood pressure with the goal of decreasing her risk of heart attack and stroke. Kim Barnes's blood pressure is reasonably well controlled.  Vitamin D deficiency Kim Barnes has a diagnosis of Vitamin D deficiency. Last Vitamin D 19.6 on 03/10/2019. She is currently taking prescription Vit D and denies nausea, vomiting or muscle weakness.  ASSESSMENT AND PLAN:  Prediabetes  Essential hypertension - Plan: amLODipine (NORVASC) 5 MG tablet  Vitamin D deficiency - Plan: Vitamin D, Ergocalciferol, (DRISDOL) 1.25 MG (50000 UT) CAPS capsule  At risk for diabetes mellitus  Class 3 severe obesity with serious comorbidity and body mass index (BMI) of 60.0 to 69.9 in adult,  unspecified obesity type (HCC)  PLAN:  Pre-Diabetes Kim Barnes will continue to work on weight loss, exercise, and decreasing simple carbohydrates in her diet to help decrease the risk of diabetes. We dicussed metformin including benefits and risks. She was informed that eating too many simple carbohydrates or too many calories at one sitting increases the likelihood of GI side effects. Kim Barnes was instructed to decrease carbohydrates, increase protein, and increase activity. She will continue metformin and follow-up with Kim Barnes as directed to monitor her progress.  Diabetes risk counseling Kim Barnes was given extended (15 minutes) diabetes prevention counseling today. She is 43 y.o. female and has risk factors for diabetes including obesity. We discussed intensive lifestyle modifications today with an emphasis on weight loss as well as increasing exercise and decreasing simple carbohydrates in her diet.   Hypertension We discussed sodium restriction, working on healthy weight loss, and a regular exercise program as the means to achieve improved blood pressure control. Sole agreed with this plan and agreed to follow up as directed. We will continue to monitor her blood pressure as well as her progress with the above lifestyle modifications. Kim Barnes was given a prescription for amlodipine 5 mg 1 QD #30 with 0 refills and agrees to follow-up with our clinic in 2-3 weeks. She will watch for signs of hypotension as she continues her lifestyle modifications.  Vitamin D Deficiency Kim Barnes was informed that low Vitamin D levels contributes to fatigue and are associated with obesity, breast, and colon cancer. She agrees to continue to take  prescription Vit D @ 50,000 IU every week #4 with 0 refills and will follow-up for routine testing of Vitamin D, at least 2-3 times per year. She was informed of the risk of over-replacement of Vitamin D and agrees to not increase her dose unless she discusses this with us first. Kim Barnes  agrees to follow-up with our clinic in 2-3 weeks.  Obesity Kim Barnes is currently in the action stage of change. As such, her goal is to continue with weight loss efforts. She has agreed to follow the Category 2 plan. Kim Barnes will work on meal planning. She will cook at home more and increase her water intake. Kim Barnes has been instructed to continue her current exercise regimen for weight loss and overall health benefits. We discussed the following Behavioral Modification Strategies today: increasing lean protein intake, decreasing simple carbohydrates, increasing vegetables, increase H20 intake, increase high fiber foods, decrease eating out, no skipping meals, work on meal planning and easy cooking plans, keeping healthy foods in the home, and planning for success.  Kim Barnes has agreed to follow-up with our clinic in 2-3 weeks. She was informed of the importance of frequent follow-up visits to maximize her success with intensive lifestyle modifications for her multiple health conditions.  ALLERGIES: No Known Allergies  MEDICATIONS: Current Outpatient Medications on File Prior to Visit  Medication Sig Dispense Refill   albuterol (PROAIR HFA) 108 (90 Base) MCG/ACT inhaler Inhale 2 puffs into the lungs every 4 (four) hours as needed for wheezing or shortness of breath. 18 g 3   ALPRAZolam (NIRAVAM) 0.5 MG dissolvable tablet Take 1 tablet (0.5 mg total) by mouth 2 (two) times daily as needed for anxiety. 10 tablet 0   amLODipine (NORVASC) 5 MG tablet TAKE 1 TABLET BY MOUTH EVERY DAY 30 tablet 0   benzonatate (TESSALON) 200 MG capsule Take 1 capsule (200 mg total) by mouth 2 (two) times daily as needed for cough. 30 capsule 1   budesonide-formoterol (SYMBICORT) 160-4.5 MCG/ACT inhaler Inhale 2 puffs into the lungs 2 (two) times daily. 3 Inhaler 3   buPROPion (WELLBUTRIN SR) 200 MG 12 hr tablet Take 1 tablet (200 mg total) by mouth daily. 30 tablet 0   BYSTOLIC 20 MG TABS TAKE 1 TABLET BY MOUTH  EVERY DAY 30 tablet 11   ibuprofen (ADVIL,MOTRIN) 200 MG tablet Take 1,200 mg by mouth daily as needed for moderate pain (tooth pain).     metFORMIN (GLUCOPHAGE) 500 MG tablet Take 1 tablet (500 mg total) by mouth daily with breakfast. 30 tablet 0   Vitamin D, Ergocalciferol, (DRISDOL) 1.25 MG (50000 UT) CAPS capsule TAKE ONE CAPSULE BY MOUTH EVERY 7 DAYS 4 capsule 0   triamterene-hydrochlorothiazide (MAXZIDE-25) 37.5-25 MG tablet Take 1 tablet by mouth daily. 30 tablet 11   No current facility-administered medications on file prior to visit.     PAST MEDICAL HISTORY: Past Medical History:  Diagnosis Date   Anxiety    Asthma    prn inhaler   Back pain    Bell's palsy    Carpal tunnel syndrome of left wrist 01/2013   Chest pain    Cold hands and feet    Cough    Depression    Dry mouth    Dry skin    Fatigue    GERD (gastroesophageal reflux disease)    occasional; no current med.   History of brain tumor 1996   no neurologic deficits   HTN (hypertension)    under control with med., has been  on med. x 5 yr.   Joint pain    Obesity    Obstructive sleep apnea    PCOS (polycystic ovarian syndrome)    Polycystic ovarian syndrome    no current med.; irregular periods   Shortness of breath    Stress    Swelling of extremity    Wheezing     PAST SURGICAL HISTORY: Past Surgical History:  Procedure Laterality Date   BRAIN SURGERY  1996   exc. tumor   CARPAL TUNNEL RELEASE Left 02/10/2013   Procedure: CARPAL TUNNEL RELEASE;  Surgeon: Nicki Reaper, MD;  Location: [facility];  Service: Orthopedics;  Laterality: Left;  ANESTHESIA: IV REGIONAL FAB   HYSTEROSCOPY W/D&C  02/21/2006   HYSTEROSCOPY W/D&C  12/17/2011   Procedure: DILATATION AND CURETTAGE /HYSTEROSCOPY;  Surgeon: Meriel Pica, MD;  Location: [facility];  Service: Gynecology;  Laterality: N/A;   LEEP  01/07/2004   TONSILLECTOMY  09/21/2002    SOCIAL HISTORY: Social History    Tobacco Use   Smoking status: Never Smoker   Smokeless tobacco: Never Used  Substance Use Topics   Alcohol use: Yes    Comment: rare; socially   Drug use: No    FAMILY HISTORY: Family History  Problem Relation Age of Onset   Hypertension Other    Lung cancer Father        Smoker   Cancer Father        lung/ smoker   Heart disease Father    Alcoholism Father    Breast cancer Maternal Grandmother    Cancer Maternal Grandmother        breast   Emphysema Paternal Grandmother    Asthma Mother    Hyperlipidemia Mother    Hypertension Mother    Sleep apnea Mother    Asthma Maternal Aunt    Cancer Maternal Aunt        liver   Cancer Paternal Uncle    Cancer Maternal Uncle        prostate   ROS: Review of Systems  Respiratory: Negative for shortness of breath.   Cardiovascular: Negative for chest pain.  Gastrointestinal: Negative for nausea and vomiting.  Musculoskeletal:       Negative for muscle weakness.  Endo/Heme/Allergies:       Negative for hypoglycemia. Negative for polyphagia.   PHYSICAL EXAM: Blood pressure 127/84, pulse 70, temperature 98.7 F (37.1 C), height  (1.676 m), weight (!) 373 lb (169.2 kg), SpO2 97 %. Body mass index is 60.2 kg/m. Physical Exam Vitals signs reviewed.  Constitutional:      Appearance: Normal appearance. She is obese.  Cardiovascular:     Rate and Rhythm: Normal rate.     Pulses: Normal pulses.  Pulmonary:     Effort: Pulmonary effort is normal.     Breath sounds: Normal breath sounds.  Musculoskeletal: Normal range of motion.  Skin:    General: Skin is warm and dry.  Neurological:     Mental Status: She is alert and oriented to person, place, and time.  Psychiatric:        Behavior: Behavior normal.   RECENT LABS AND TESTS: BMET    Component Value Date/Time   NA 147 (H) 03/10/2019 0932   K 4.3 03/10/2019 0932   CL 104 03/10/2019 0932   CO2 26 03/10/2019 0932   GLUCOSE 80 03/10/2019  0932   GLUCOSE 103 (H) 12/31/2017 0926   GLUCOSE 83 10/08/2006 1418   BUN 12  03/10/2019 0932   CREATININE 0.79 03/10/2019 0932   CALCIUM 9.7 03/10/2019 0932   GFRNONAA 94 03/10/2019 0932   GFRAA 108 03/10/2019 0932   Lab Results  Component Value Date   HGBA1C 5.8 (H) 03/10/2019   HGBA1C 5.7 (H) 11/11/2018   HGBA1C 5.7 (H) 07/28/2018   HGBA1C 5.8 (H) 02/26/2018   HGBA1C 5.3 03/31/2015   Lab Results  Component Value Date   INSULIN 34.6 (H) 03/10/2019   INSULIN 26.3 (H) 11/11/2018   INSULIN 14.8 07/28/2018   INSULIN 26.4 (H) 02/26/2018   CBC    Component Value Date/Time   WBC 5.4 02/26/2018 0936   WBC 5.7 12/31/2017 0926   RBC 4.74 02/26/2018 0936   RBC 4.82 12/31/2017 0926   HGB 13.6 02/26/2018 0936   HCT 42.2 02/26/2018 0936   PLT 281.0 12/31/2017 0926   MCV 89 02/26/2018 0936   MCH 28.7 02/26/2018 0936   MCH 29.3 02/04/2017 1851   MCHC 32.2 02/26/2018 0936   MCHC 33.8 12/31/2017 0926   RDW 14.7 02/26/2018 0936   LYMPHSABS 2.1 02/26/2018 0936   MONOABS 0.3 12/31/2017 0926   EOSABS 0.1 02/26/2018 0936   BASOSABS 0.0 02/26/2018 0936   Iron/TIBC/Ferritin/ %Sat No results found for: IRON, TIBC, FERRITIN, IRONPCTSAT Lipid Panel     Component Value Date/Time   CHOL 216 (H) 03/10/2019 0932   TRIG 81 03/10/2019 0932   HDL 55 03/10/2019 0932   CHOLHDL 4 12/31/2017 0926   VLDL 17.2 12/31/2017 0926   LDLCALC 145 (H) 03/10/2019 0932   LDLDIRECT 149.4 05/24/2010 0904   Hepatic Function Panel     Component Value Date/Time   PROT 7.6 03/10/2019 0932   ALBUMIN 4.4 03/10/2019 0932   AST 20 03/10/2019 0932   ALT 17 03/10/2019 0932   ALKPHOS 67 03/10/2019 0932   BILITOT 0.4 03/10/2019 0932   BILIDIR 0.0 12/31/2017 0926      Component Value Date/Time   TSH 3.240 03/10/2019 0932   TSH 2.540 02/26/2018 0936   TSH 1.78 12/31/2017 0926   Results for Buehrle, Jajaira L (MRN [id number]) as of 10/06/2019 09:07  Ref. Range 03/10/2019 09:32  Vitamin D, 25-Hydroxy Latest Ref  Range: 30.0 - 100.0 ng/mL 19.6 (L)   OBESITY BEHAVIORAL INTERVENTION VISIT  Today's visit was #38  Starting weight: 378 lbs Starting date: 02/26/2018 Today's weight: 373 lbs Today's date: 10/06/2019 Total lbs lost to date: 5    10/06/2019  Height 5\' 6"  (1.676 m)  Weight 373 lb (169.2 kg) (A)  BMI (Calculated) 60.23  BLOOD PRESSURE - SYSTOLIC 401  BLOOD PRESSURE - DIASTOLIC 84   Body Fat % 02.7 %   ASK: We discussed the diagnosis of obesity with Roslynn L Hodgkin today and Addysen agreed to give Kim Barnes permission to discuss obesity behavioral modification therapy today.  ASSESS: Bernadetta has the diagnosis of obesity and her BMI today is 60.3. Breuna is in the action stage of change.   ADVISE: Vinette was educated on the multiple health risks of obesity as well as the benefit of weight loss to improve her health. She was advised of the need for long term treatment and the importance of lifestyle modifications to improve her current health and to decrease her risk of future health problems.  AGREE: Multiple dietary modification options and treatment options were discussed and  Nemesis agreed to follow the recommendations documented in the above note.  ARRANGE: Shajuan was educated on the importance of frequent visits to treat obesity as outlined per  CMS and USPSTF guidelines and agreed to schedule her next follow up appointment today.  Fernanda Drum, am acting as Energy manager for [employer], DO  I have reviewed the above documentation for accuracy and completeness, and I agree with the above. -Corinna Capra, DO

## 2019-10-13 ENCOUNTER — Encounter: Payer: Self-pay | Admitting: Internal Medicine

## 2019-10-13 ENCOUNTER — Other Ambulatory Visit: Payer: Self-pay

## 2019-10-13 ENCOUNTER — Ambulatory Visit (INDEPENDENT_AMBULATORY_CARE_PROVIDER_SITE_OTHER): Payer: 59 | Admitting: Internal Medicine

## 2019-10-13 ENCOUNTER — Ambulatory Visit (INDEPENDENT_AMBULATORY_CARE_PROVIDER_SITE_OTHER): Payer: 59

## 2019-10-13 DIAGNOSIS — R05 Cough: Secondary | ICD-10-CM | POA: Diagnosis not present

## 2019-10-13 DIAGNOSIS — R058 Other specified cough: Secondary | ICD-10-CM

## 2019-10-13 MED ORDER — FAMOTIDINE 20 MG PO TABS: ORAL_TABLET | ORAL | 11 refills | Status: DC

## 2019-10-13 MED ORDER — TRAMADOL HCL 50 MG PO TABS: 50.0000 mg | ORAL_TABLET | ORAL | 0 refills | Status: AC | PRN

## 2019-10-13 MED ORDER — PANTOPRAZOLE SODIUM 40 MG PO TBEC: 40.0000 mg | DELAYED_RELEASE_TABLET | Freq: Every day | ORAL | 2 refills | Status: DC

## 2019-10-13 MED ORDER — PREDNISONE 10 MG PO TABS: ORAL_TABLET | ORAL | 0 refills | Status: DC

## 2019-10-13 NOTE — Assessment & Plan Note (Signed)
-   cyclical cough regimen 0/27/25 > resolved - onset Aug 3664 > repeat cyclical cough rx 40/02/4741   Of the three most common causes of  Sub-acute / recurrent or chronic cough, only one (GERD)  can actually contribute to/ trigger  the other two (asthma and post nasal drip syndrome)  and perpetuate the cylce of cough.  While not intuitively obvious, many patients with chronic low grade reflux do not cough until there is a primary insult that disturbs the protective epithelial barrier and exposes sensitive nerve endings.   This is typically viral but can due to PNDS and  either may apply here.   The point is that once this occurs, it is difficult to eliminate the cycle  using anything but a maximally effective acid suppression regimen at least in the short run, accompanied by an appropriate diet to address non acid GERD and control / eliminate the cough itself for at least 3 days with tramadol. Also added 6 days of Prednisone in case of component of Th-2 driven upper or lower airways inflammation (if cough responds short term only to relapse befor return while will on rx for uacs that would point to allergic rhinitis/ asthma or eos bronchitis)

## 2019-10-13 NOTE — Progress Notes (Signed)
Caro Larochearisa L Fink, female    DOB: 11/10/1976,     MRN: [id number]   Brief patient profile:  [age] yobf  Never smoker with asthma as child on prn saba eval by Dr Maple HudsonYoung on shots in elementary school  by HS gone but by her 6520s would note cough esp in winter typically resolved with pred and cough suppression evaluated 05/2014 rec rx for UACS with gerd rx and cyclical cough  but better  For months including  From Jan 2020 until Aug 2020 s maint rx of any kind  then worse since then  rx with cough drops and saba so self referred back 10/13/2019       History of Present Illness  10/13/2019  Pulmonary/ 1st office eval/Yassmine Tamm   Chief Complaint  Patient presents with  . Pulmonary Consult    Last seen here in 2015. Pt c/o increased cough since Sept 2020.  Cough is non prod and she also c/o wheezing. She is using her albuterol inhaler at least 2 x per day.   Dyspnea:  MMRC1 = can walk nl pace, flat grade, can't hurry or go uphills or steps s sob   Cough: esp evening better p sleeping no am flare / feels like something's stuck in throat but min actual mucoid sputum produced  Sleep: on side bed flat ok  SABA use: 2-4 x per day does seem to help  No obvious day to day or daytime variability or assoc excess/ purulent sputum or mucus plugs or hemoptysis or cp or chest tightness, subjective wheeze or overt sinus or hb symptoms.   Sleeping as above without nocturnal  or early am exacerbation  of respiratory  c/o's or need for noct saba. Also denies any obvious fluctuation of symptoms with weather or environmental changes or other aggravating or alleviating factors except as outlined above   No unusual exposure hx or h/o childhood pna or knowledge of premature birth.  Current Allergies, Complete Past Medical History, Past Surgical History, Family History, and Social History were reviewed in [facility] electronic medical record.  ROS  The following are not active complaints unless bolded Hoarseness, sore throat,  dysphagia, dental problems, itching, sneezing,  nasal congestion or discharge of excess mucus or purulent secretions, ear ache,   fever, chills, sweats, unintended wt loss or wt gain, classically pleuritic or exertional cp,  orthopnea pnd or arm/hand swelling  or leg swelling, presyncope, palpitations, abdominal pain, anorexia, nausea, vomiting, diarrhea  or change in bowel habits or change in bladder habits, change in stools or change in urine, dysuria, hematuria,  rash, arthralgias, visual complaints, headache, numbness, weakness or ataxia or problems with walking or coordination,  change in mood or  memory.             Past Medical History:  Diagnosis Date  . Anxiety   . Asthma    prn inhaler  . Back pain   . Bell's palsy   . Carpal tunnel syndrome of left wrist 01/2013  . Chest pain   . Cold hands and feet   . Cough   . Depression   . Dry mouth   . Dry skin   . Fatigue   . GERD (gastroesophageal reflux disease)    occasional; no current med.  [name]. History of brain tumor 1996   no neurologic deficits  . HTN (hypertension)    under control with med., has been on med. x 5 yr.  . Joint pain   . Obesity   .  Obstructive sleep apnea   . PCOS (polycystic ovarian syndrome)   . Polycystic ovarian syndrome    no current med.; irregular periods  . Shortness of breath   . Stress   . Swelling of extremity   . Wheezing     Outpatient Medications Prior to Visit  Medication Sig Dispense Refill  . albuterol (PROAIR HFA) 108 (90 Base) MCG/ACT inhaler Inhale 2 puffs into the lungs every 4 (four) hours as needed for wheezing or shortness of breath. 18 g 3  . ALPRAZolam (NIRAVAM) 0.5 MG dissolvable tablet Take 1 tablet (0.5 mg total) by mouth 2 (two) times daily as needed for anxiety. 10 tablet 0  . amLODipine (NORVASC) 5 MG tablet Take 1 tablet (5 mg total) by mouth daily. 30 tablet 0  . benzonatate (TESSALON) 200 MG capsule Take 1 capsule (200 mg total) by mouth 2 (two) times daily as needed  for cough. 30 capsule 1  . buPROPion (WELLBUTRIN SR) 200 MG 12 hr tablet Take 1 tablet (200 mg total) by mouth daily. 30 tablet 0  . BYSTOLIC 20 MG TABS TAKE 1 TABLET BY MOUTH EVERY DAY 30 tablet 11  . ibuprofen (ADVIL,MOTRIN) 200 MG tablet Take 1,200 mg by mouth daily as needed for moderate pain (tooth pain).    . metFORMIN (GLUCOPHAGE) 500 MG tablet Take 1 tablet (500 mg total) by mouth daily with breakfast. 30 tablet 0  . Vitamin D, Ergocalciferol, (DRISDOL) 1.25 MG (50000 UT) CAPS capsule TAKE ONE CAPSULE BY MOUTH EVERY 7 DAYS 4 capsule 0  . triamterene-hydrochlorothiazide (MAXZIDE-25) 37.5-25 MG tablet Take 1 tablet by mouth daily. 30 tablet 11  .           Objective:     BP 138/86 (BP Location: Left Arm, Cuff Size: Large)   Pulse 70   Temp (!) 97.1 F (36.2 C) (Temporal)   Ht 5\' 6"  (1.676 m)   Wt (!) 387 lb (175.5 kg)   SpO2 99% Comment: on RA  BMI 62.46 kg/m   SpO2: 99 %(on RA)   amb  MO (by bmi) bf nad   Wt Readings from Last 3 Encounters:  10/13/19 (!) 387 lb (175.5 kg)  10/06/19 (!) 373 lb (169.2 kg)  09/08/19 (!) 376 lb (170.6 kg)     up   HEENT : pt wearing mask not removed for exam due to covid -19 concerns.    NECK :  without JVD/Nodes/TM/ nl carotid upstrokes bilaterally   LUNGS: no acc muscle use,  Nl contour chest which is clear to A and P bilaterally without cough on insp or exp maneuvers   CV:  RRR  no s3 or murmur or increase in P2, and no edema   ABD:  soft and nontender with nl inspiratory excursion in the supine position. No bruits or organomegaly appreciated, bowel sounds nl  MS:  Nl gait/ ext warm without deformities, calf tenderness, cyanosis or clubbing No obvious joint restrictions   SKIN: warm and dry without lesions    NEURO:  alert, approp, nl sensorium with  no motor or cerebellar deficits apparent.    CXR PA and Lateral:   10/13/2019 :    I personally reviewed images - impression as follows:   slt reduced lung vol, no acute  findings         Assessment   Upper airway cough syndrome - cyclical cough regimen 03/21/86 > resolved - onset Aug 8676 > repeat cyclical cough rx 72/0/9470   Of the  three most common causes of  Sub-acute / recurrent or chronic cough, only one (GERD)  can actually contribute to/ trigger  the other two (asthma and post nasal drip syndrome)  and perpetuate the cylce of cough.  While not intuitively obvious, many patients with chronic low grade reflux do not cough until there is a primary insult that disturbs the protective epithelial barrier and exposes sensitive nerve endings.   This is typically viral but can due to PNDS and  either may apply here.   The point is that once this occurs, it is difficult to eliminate the cycle  using anything but a maximally effective acid suppression regimen at least in the short run, accompanied by an appropriate diet to address non acid GERD and control / eliminate the cough itself for at least 3 days with tramadol. Also added 6 days of Prednisone in case of component of Th-2 driven upper or lower airways inflammation (if cough responds short term only to relapse befor return while will on rx for uacs that would point to allergic rhinitis/ asthma or eos bronchitis)         Morbid obesity due to excess calories (HCC) Body mass index is 62.46 kg/m.  -  trending up Lab Results  Component Value Date   TSH 3.240 03/10/2019    Contributing to gerd risk/ doe/reviewed the need and the process to achieve and maintain neg calorie balance > defer f/u primary care including intermittently monitoring thyroid status.      Total time devoted to counseling  > 50 % of initial 60 min office visit:  review case with pt/ discussion of options/alternatives/ personally creating written customized instructions  in presence of pt  then going over those specific  Instructions directly with the pt including how to use all of the meds but in particular covering each new  medication in detail and the difference between the maintenance= "automatic" meds and the prns using an action plan format for the latter (If this problem/symptom => do that organization reading Left to right).  Please see AVS from this visit for a full list of these instructions which I personally wrote for this pt and  are unique to this visit.       Sandrea Hughs, MD 10/13/2019

## 2019-10-13 NOTE — Patient Instructions (Addendum)
The key to effective treatment for your cough is eliminating the non-stop cycle of cough you're stuck in long enough to let your airway heal completely and then see if there is anything still making you cough once you stop the cough suppression, but this should take no more than 5 days to figure out  First take delsym two tsp every 12 hours and supplement if needed with  tramadol 50 mg up to 1 every 4 hours to suppress the urge to cough at all or even clear your throat. Swallowing water or using ice chips/non mint and menthol containing candies (such as lifesavers or sugarless jolly ranchers) are also effective.  You should rest your voice and avoid activities that you know make you cough.  Once you have eliminated the cough for 3 straight days try reducing the tramadol first,  then the delsym as tolerated.    Prednisone 10 mg take  4 each am x 2 days,   2 each am x 2 days,  1 each am x 2 days and stop (this is to eliminate allergies and inflammation from coughing)  Protonix (pantoprazole) Take 30-60 min before first meal of the day and Pepcid 20 mg one after supper nightly  GERD (REFLUX)  is an extremely common cause of respiratory symptoms, many times with no significant heartburn at all.    It can be treated with medication, but also with lifestyle changes including avoidance of late meals, excessive alcohol, smoking cessation, and avoid fatty foods, chocolate, peppermint, colas, red wine, and acidic juices such as orange juice.  NO MINT OR MENTHOL PRODUCTS SO NO COUGH DROPS   USE HARD CANDY INSTEAD (jolley ranchers or Stover's or Lifesavers (all available in sugarless versions) NO OIL BASED VITAMINS - use powdered substitutes.  Please remember to go to the  x-ray department  for your tests - we will call you with the results when they are available    Please schedule a follow up office visit in 4 weeks, sooner if needed

## 2019-10-13 NOTE — Assessment & Plan Note (Signed)
Body mass index is 62.46 kg/m.  -  trending up Lab Results  Component Value Date   TSH 3.240 03/10/2019     Contributing to gerd risk/ doe/reviewed the need and the process to achieve and maintain neg calorie balance > defer f/u primary care including intermittently monitoring thyroid status.   Total time devoted to counseling  > 50 % of initial 60 min office visit:  review case with pt/ discussion of options/alternatives/ personally creating written customized instructions  in presence of pt  then going over those specific  Instructions directly with the pt including how to use all of the meds but in particular covering each new medication in detail and the difference between the maintenance= "automatic" meds and the prns using an action plan format for the latter (If this problem/symptom => do that organization reading Left to right).  Please see AVS from this visit for a full list of these instructions which I personally wrote for this pt and  are unique to this visit.

## 2019-10-14 NOTE — Progress Notes (Signed)
Spoke with pt and notified of results per Dr. Wert. Pt verbalized understanding and denied any questions. 

## 2019-10-15 ENCOUNTER — Other Ambulatory Visit: Payer: Self-pay

## 2019-10-16 ENCOUNTER — Ambulatory Visit (INDEPENDENT_AMBULATORY_CARE_PROVIDER_SITE_OTHER): Payer: 59 | Admitting: Obstetrics & Gynecology

## 2019-10-16 ENCOUNTER — Encounter: Payer: Self-pay | Admitting: Obstetrics & Gynecology

## 2019-10-16 ENCOUNTER — Other Ambulatory Visit: Payer: Self-pay

## 2019-10-16 VITALS — BP 138/86 | Ht 66.0 in | Wt 384.0 lb

## 2019-10-16 DIAGNOSIS — Z01419 Encounter for gynecological examination (general) (routine) without abnormal findings: Secondary | ICD-10-CM

## 2019-10-16 DIAGNOSIS — Z6841 Body Mass Index (BMI) 40.0 and over, adult: Secondary | ICD-10-CM

## 2019-10-16 DIAGNOSIS — Z3009 Encounter for other general counseling and advice on contraception: Secondary | ICD-10-CM

## 2019-10-16 NOTE — Progress Notes (Signed)
Kim Barnes 1976/03/30 [id number]   History:    43 y.o. G2P0A2 Stable boyfriend x 7 yrs  RP:  Established patient presenting for annual gyn exam   HPI: Menses every 1-2 months, normal flow, no BTB.  No pelvic pain.  Would like to conceive.  BMI 61.98.  Seen by Bariatric Surgeon this AM.  Health labs with family physician.  Past medical history,surgical history, family history and social history were all reviewed and documented in the EPIC chart.  Gynecologic History Patient's last menstrual period was 08/29/2019. Contraception: none Last Pap: 09/2018. Results were: Negative/HPV HR Negative Last mammogram: 09/2019. Results were: Lt negative.  Rt probably benign.  Rt Dx mammo/US in 6 months Bone Density: Never Colonoscopy: Never  Obstetric History OB History  Gravida Para Term Preterm AB Living  2       2 0  SAB TAB Ectopic Multiple Live Births  1 1     0    # Outcome Date GA Lbr Len/2nd Weight Sex Delivery Anes PTL Lv  2 SAB           1 TAB              ROS: A ROS was performed and pertinent positives and negatives are included in the history.  GENERAL: No fevers or chills. HEENT: No change in vision, no earache, sore throat or sinus congestion. NECK: No pain or stiffness. CARDIOVASCULAR: No chest pain or pressure. No palpitations. PULMONARY: No shortness of breath, cough or wheeze. GASTROINTESTINAL: No abdominal pain, nausea, vomiting or diarrhea, melena or bright red blood per rectum. GENITOURINARY: No urinary frequency, urgency, hesitancy or dysuria. MUSCULOSKELETAL: No joint or muscle pain, no back pain, no recent trauma. DERMATOLOGIC: No rash, no itching, no lesions. ENDOCRINE: No polyuria, polydipsia, no heat or cold intolerance. No recent change in weight. HEMATOLOGICAL: No anemia or easy bruising or bleeding. NEUROLOGIC: No headache, seizures, numbness, tingling or weakness. PSYCHIATRIC: No depression, no loss of interest in normal activity or change in sleep pattern.     Exam:   BP 138/86   Ht 5\' 6"  (1.676 m)   Wt (!) 384 lb (174.2 kg)   LMP 08/29/2019   BMI 61.98 kg/m   Body mass index is 61.98 kg/m.  General appearance : Well developed well nourished female. No acute distress HEENT: Eyes: no retinal hemorrhage or exudates,  Neck supple, trachea midline, no carotid bruits, no thyroidmegaly Lungs: Clear to auscultation, no rhonchi or wheezes, or rib retractions  Heart: Regular rate and rhythm, no murmurs or gallops Breast:Examined in sitting and supine position were symmetrical in appearance, no palpable masses or tenderness,  no skin retraction, no nipple inversion, no nipple discharge, no skin discoloration, no axillary or supraclavicular lymphadenopathy Abdomen: no palpable masses or tenderness, no rebound or guarding Extremities: no edema or skin discoloration or tenderness  Pelvic: Vulva: Normal             Vagina: No gross lesions or discharge  Cervix: No gross lesions or discharge.  Pap reflex done.  Uterus  AV, normal size, shape and consistency, non-tender and mobile  Adnexa  Without masses or tenderness  Anus: Normal   Assessment/Plan:  43 y.o. female for annual exam   1. Encounter for routine gynecological examination with Papanicolaou smear of cervix Normal gynecologic exam.  Pap reflex done.  Breast exam normal.  Screening mammogram October 2020 was negative on the left and probably benign on the right.  Will have a  right diagnostic mammogram and ultrasound in 6 months.  Health labs with family physician.  2. Encounter for other general counseling or advice on contraception Declines contraception.  Would like to conceive.  3. Class 3 severe obesity due to excess calories with serious comorbidity and body mass index (BMI) of 50.0 to 59.9 in adult [facility]) Morbid obesity.  Consulted with bariatric surgeon.  Planning to proceed with bariatric surgery.  Currently on the low caloric diet.  Encouraged to increase physical activities.   [name] MD, 1:04 PM 10/16/2019

## 2019-10-17 ENCOUNTER — Encounter: Payer: Self-pay | Admitting: Obstetrics & Gynecology

## 2019-10-17 NOTE — Patient Instructions (Signed)
1. Encounter for routine gynecological examination with Papanicolaou smear of cervix Normal gynecologic exam.  Pap reflex done.  Breast exam normal.  Screening mammogram October 2020 was negative on the left and probably benign on the right.  Will have a right diagnostic mammogram and ultrasound in 6 months.  Health labs with family physician.  2. Encounter for other general counseling or advice on contraception Declines contraception.  Would like to conceive.  3. Class 3 severe obesity due to excess calories with serious comorbidity and body mass index (BMI) of 50.0 to 59.9 in adult [facility]) Morbid obesity.  Consulted with bariatric surgeon.  Planning to proceed with bariatric surgery.  Currently on the low caloric diet.  Encouraged to increase physical activities.  Kim Barnes, it was a pleasure seeing you today!  I will inform you of your results as soon as they are available.

## 2019-10-19 LAB — PAP IG W/ RFLX HPV ASCU

## 2019-10-27 ENCOUNTER — Ambulatory Visit (INDEPENDENT_AMBULATORY_CARE_PROVIDER_SITE_OTHER): Payer: 59 | Admitting: Bariatrics

## 2019-10-27 ENCOUNTER — Other Ambulatory Visit (INDEPENDENT_AMBULATORY_CARE_PROVIDER_SITE_OTHER): Payer: Self-pay | Admitting: Internal Medicine

## 2019-10-27 DIAGNOSIS — I10 Essential (primary) hypertension: Secondary | ICD-10-CM

## 2019-11-11 ENCOUNTER — Ambulatory Visit (INDEPENDENT_AMBULATORY_CARE_PROVIDER_SITE_OTHER): Payer: 59 | Admitting: Internal Medicine

## 2019-11-11 ENCOUNTER — Other Ambulatory Visit: Payer: Self-pay

## 2019-11-11 ENCOUNTER — Encounter: Payer: Self-pay | Admitting: Internal Medicine

## 2019-11-11 DIAGNOSIS — R058 Other specified cough: Secondary | ICD-10-CM

## 2019-11-11 DIAGNOSIS — R05 Cough: Secondary | ICD-10-CM | POA: Diagnosis not present

## 2019-11-11 NOTE — Patient Instructions (Signed)
No change in meds x 6 weeks and if all better after the holidays ok to stop am dose of protonix   Please schedule a follow up visit in 3 months but call sooner if needed

## 2019-11-11 NOTE — Assessment & Plan Note (Signed)
-   cyclical cough regimen 3/64/68 > resolved - onset Aug 0321 > repeat cyclical cough rx 22/03/8249 > cough resolved so rec 3 months gerd rx then f/u   Upper airway cough syndrome (previously labeled PNDS),  is so named because it's frequently impossible to sort out how much is  CR/sinusitis with freq throat clearing (which can be related to primary GERD)   vs  causing  secondary (" extra esophageal")  GERD from wide swings in gastric pressure that occur with throat clearing, often  promoting self use of mint and menthol lozenges that reduce the lower esophageal sphincter tone and exacerbate the problem further in a cyclical fashion.   These are the same pts (now being labeled as having "irritable larynx syndrome" by some cough centers) who not infrequently have a history of having failed to tolerate ace inhibitors,  dry powder inhalers or biphosphonates or report having atypical/extraesophageal reflux symptoms that don't respond to standard doses of PPI  and are easily confused as having aecopd or asthma flares by even experienced allergists/ pulmonologists (myself included).   Much better p cyclical cough regimen but risk of gerd related to obesity   Discussed the recent press about ppi's in the context of a statistically significant (but questionably clinically relevant) increase in CRI in pts on ppi vs h2's > bottom line is the lowest dose of ppi that controls   gerd is the right dose and if that dose is zero that's fine esp since h2's are cheaper so p 6 weeks try off ppi and just on pepcid x another 6 weeks then regroup in office

## 2019-11-11 NOTE — Assessment & Plan Note (Addendum)
Body mass index is 61.98 kg/m.  -  trending = no change  Lab Results  Component Value Date   TSH 3.240 03/10/2019     Contributing to gerd risk/ doe/reviewed the need and the process to achieve and maintain neg calorie balance > defer f/u primary care including intermittently monitoring thyroid status .   I had an extended discussion with the patient reviewing all relevant studies completed to date and  lasting 15 to 20 minutes of a 25 minute visit    Each maintenance medication was reviewed in detail including most importantly the difference between maintenance and prns and under what circumstances the prns are to be triggered using an action plan format that is not reflected in the computer generated alphabetically organized AVS.     Please see AVS for specific instructions unique to this visit that I personally wrote and verbalized to the the pt in detail and then reviewed with pt  by my nurse highlighting any  changes in therapy recommended at today's visit to their plan of care.

## 2019-11-11 NOTE — Progress Notes (Signed)
Kim Barnes, female    DOB: [birth year],     MRN: [id number]   Brief patient profile:  74 yobf  Never smoker with asthma as child on prn saba eval by Dr Maple Hudson on shots in elementary school  by HS gone but by her 28s would note cough esp in winter typically resolved with pred and cough suppression evaluated 05/2014 rec rx for UACS with gerd rx and cyclical cough  but better  For months including  From Jan 2020 until Aug 2020 s maint rx of any kind  then worse since then  rx with cough drops and saba so self referred back 10/13/2019       History of Present Illness  10/13/2019  Pulmonary/ 1st office eval/   Chief Complaint  Patient presents with  . Pulmonary Consult    Last seen here in 2015. Pt c/o increased cough since Sept 2020.  Cough is non prod and she also c/o wheezing. She is using her albuterol inhaler at least 2 x per day.   Dyspnea:  MMRC1 = can walk nl pace, flat grade, can't hurry or go uphills or steps s sob   Cough: esp evening better p sleeping no am flare / feels like something's stuck in throat but min actual mucoid sputum produced  Sleep: on side bed flat ok  SABA use: 2-4 x per day does seem to help rec First take delsym two tsp every 12 hours and supplement if needed with  tramadol 50 mg up to 1 every 4 hours to suppress the urge to cough at all or even clear your throat Prednisone 10 mg take  4 each am x 2 days,   2 each am x 2 days,  1 each am x 2 days and stop (this is to eliminate allergies and inflammation from coughing) Protonix (pantoprazole) Take 30-60 min before first meal of the day and Pepcid 20 mg one after supper nightly GERD diet    11/11/2019  f/u ov/ re: uacs  Resolved / maint just on gerd rx  Chief Complaint  Patient presents with  . Follow-up    Cough is much improved. She has only used her albuterol once since the last visit.   Dyspnea:  Not limited by breathing from desired activities   Cough: gone/ still occ pnds sensation  Sleeping: bed  is flat / no resp symptoms  SABA use: rarely  02: none     No obvious day to day or daytime variability or assoc excess/ purulent sputum or mucus plugs or hemoptysis or cp or chest tightness, subjective wheeze or overt sinus or hb symptoms.   Sleeping as above  without nocturnal  or early am exacerbation  of respiratory  c/o's or need for noct saba. Also denies any obvious fluctuation of symptoms with weather or environmental changes or other aggravating or alleviating factors except as outlined above   No unusual exposure hx or h/o childhood pna or knowledge of premature birth.  Current Allergies, Complete Past Medical History, Past Surgical History, Family History, and Social History were reviewed in [employer] record.  ROS  The following are not active complaints unless bolded Hoarseness, sore throat, dysphagia, dental problems, itching, sneezing,  nasal congestion or discharge of excess mucus or purulent secretions, ear ache,   fever, chills, sweats, unintended wt loss or wt gain, classically pleuritic or exertional cp,  orthopnea pnd or arm/hand swelling  or leg swelling, presyncope, palpitations, abdominal pain, anorexia, nausea,  vomiting, diarrhea  or change in bowel habits or change in bladder habits, change in stools or change in urine, dysuria, hematuria,  rash, arthralgias, visual complaints, headache, numbness, weakness or ataxia or problems with walking or coordination,  change in mood or  memory.        Current Meds  Medication Sig  . albuterol (PROAIR HFA) 108 (90 Base) MCG/ACT inhaler Inhale 2 puffs into the lungs every 4 (four) hours as needed for wheezing or shortness of breath.  . ALPRAZolam (NIRAVAM) 0.5 MG dissolvable tablet Take 1 tablet (0.5 mg total) by mouth 2 (two) times daily as needed for anxiety.  [name]. amLODipine (NORVASC) 5 MG tablet Take 1 tablet (5 mg total) by mouth daily.  [name]. buPROPion (WELLBUTRIN SR) 200 MG 12 hr tablet Take 1 tablet (200  mg total) by mouth daily.  [name]. BYSTOLIC 20 MG TABS TAKE 1 TABLET BY MOUTH EVERY DAY  . famotidine (PEPCID) 20 MG tablet One after supper  . ibuprofen (ADVIL,MOTRIN) 200 MG tablet Take 1,200 mg by mouth daily as needed for moderate pain (tooth pain).  . metFORMIN (GLUCOPHAGE) 500 MG tablet Take 1 tablet (500 mg total) by mouth daily with breakfast.  . pantoprazole (PROTONIX) 40 MG tablet Take 1 tablet (40 mg total) by mouth daily. Take 30-60 min before first meal of the day  . triamterene-hydrochlorothiazide (MAXZIDE-25) 37.5-25 MG tablet Take 1 tablet by mouth daily. Annual appt due in January must see provider for future refills  . Vitamin D, Ergocalciferol, (DRISDOL) 1.25 MG (50000 UT) CAPS capsule TAKE ONE CAPSULE BY MOUTH EVERY 7 DAYS                     Past Medical History:  Diagnosis Date  . Anxiety   . Asthma    prn inhaler  . Back pain   . Bell's palsy   . Carpal tunnel syndrome of left wrist 01/2013  . Chest pain   . Cold hands and feet   . Cough   . Depression   . Dry mouth   . Dry skin   . Fatigue   . GERD (gastroesophageal reflux disease)    occasional; no current med.  [name]. History of brain tumor 1996   no neurologic deficits  . HTN (hypertension)    under control with med., has been on med. x 5 yr.  . Joint pain   . Obesity   . Obstructive sleep apnea   . PCOS (polycystic ovarian syndrome)   . Polycystic ovarian syndrome    no current med.; irregular periods  . Shortness of breath   . Stress   . Swelling of extremity   . Wheezing        Objective:      obese pleasant bf   11/11/2019           384   10/13/19 (!) 387 lb (175.5 kg)  10/06/19 (!) 373 lb (169.2 kg)  09/08/19 (!) 376 lb (170.6 kg)     Vital signs reviewed - Note on arrival 02 sats  99% on RA     HEENT : pt wearing mask not removed for exam due to covid -19 concerns.    NECK :  without JVD/Nodes/TM/ nl carotid upstrokes bilaterally   LUNGS: no acc muscle use,  Nl contour chest  which is clear to A and P bilaterally without cough on insp or exp maneuvers   CV:  RRR  no s3 or murmur or  increase in P2, and no edema   ABD:  Massively obese but soft and nontender with nl inspiratory excursion in the supine position. No bruits or organomegaly appreciated, bowel sounds nl  MS:  Nl gait/ ext warm without deformities, calf tenderness, cyanosis or clubbing No obvious joint restrictions   SKIN: warm and dry without lesions    NEURO:  alert, approp, nl sensorium with  no motor or cerebellar deficits apparent.       Assessment

## 2019-12-11 HISTORY — PX: GASTRIC BYPASS: SHX52

## 2020-01-05 ENCOUNTER — Other Ambulatory Visit: Payer: Self-pay | Admitting: Internal Medicine

## 2020-01-05 ENCOUNTER — Encounter: Payer: Self-pay | Admitting: Internal Medicine

## 2020-01-05 ENCOUNTER — Ambulatory Visit (INDEPENDENT_AMBULATORY_CARE_PROVIDER_SITE_OTHER): Payer: 59 | Admitting: Internal Medicine

## 2020-01-05 ENCOUNTER — Other Ambulatory Visit: Payer: Self-pay

## 2020-01-05 VITALS — BP 132/98 | HR 60 | Temp 98.5°F | Ht 66.0 in | Wt 384.0 lb

## 2020-01-05 DIAGNOSIS — R7303 Prediabetes: Secondary | ICD-10-CM | POA: Diagnosis not present

## 2020-01-05 DIAGNOSIS — I10 Essential (primary) hypertension: Secondary | ICD-10-CM

## 2020-01-05 DIAGNOSIS — F419 Anxiety disorder, unspecified: Secondary | ICD-10-CM

## 2020-01-05 DIAGNOSIS — R739 Hyperglycemia, unspecified: Secondary | ICD-10-CM | POA: Diagnosis not present

## 2020-01-05 DIAGNOSIS — Z0000 Encounter for general adult medical examination without abnormal findings: Secondary | ICD-10-CM

## 2020-01-05 LAB — BASIC METABOLIC PANEL
BUN: 13 mg/dL (ref 6–23)
CO2: 27 mEq/L (ref 19–32)
Calcium: 9.7 mg/dL (ref 8.4–10.5)
Chloride: 102 mEq/L (ref 96–112)
Creatinine, Ser: 0.87 mg/dL (ref 0.40–1.20)
GFR: 86.58 mL/min (ref >60.00)
Glucose, Bld: 87 mg/dL (ref 70–99)
Potassium: 3.4 mEq/L — ABNORMAL LOW (ref 3.5–5.1)
Sodium: 136 mEq/L (ref 135–145)

## 2020-01-05 LAB — LIPID PANEL
Cholesterol: 210 mg/dL — ABNORMAL HIGH (ref 0–200)
HDL: 52.9 mg/dL (ref >39.00)
LDL Cholesterol: 139 mg/dL — ABNORMAL HIGH (ref 0–99)
NonHDL: 156.64
Total CHOL/HDL Ratio: 4
Triglycerides: 86 mg/dL (ref 0.0–149.0)
VLDL: 17.2 mg/dL (ref 0.0–40.0)

## 2020-01-05 LAB — URINALYSIS
Bilirubin Urine: NEGATIVE
Ketones, ur: NEGATIVE
Leukocytes,Ua: NEGATIVE
Nitrite: NEGATIVE
Specific Gravity, Urine: 1.02 (ref 1.000–1.030)
Total Protein, Urine: NEGATIVE
Urine Glucose: NEGATIVE
Urobilinogen, UA: 0.2 (ref 0.0–1.0)
pH: 7 (ref 5.0–8.0)

## 2020-01-05 LAB — CBC WITH DIFFERENTIAL/PLATELET
Basophils Absolute: 0 10*3/uL (ref 0.0–0.1)
Basophils Relative: 0.7 % (ref 0.0–3.0)
Eosinophils Absolute: 0.1 10*3/uL (ref 0.0–0.7)
Eosinophils Relative: 1.2 % (ref 0.0–5.0)
HCT: 41.5 % (ref 36.0–46.0)
Hemoglobin: 13.9 g/dL (ref 12.0–15.0)
Lymphocytes Relative: 34.3 % (ref 12.0–46.0)
Lymphs Abs: 1.8 10*3/uL (ref 0.7–4.0)
MCHC: 33.5 g/dL (ref 30.0–36.0)
MCV: 89.2 fl (ref 78.0–100.0)
Monocytes Absolute: 0.3 10*3/uL (ref 0.1–1.0)
Monocytes Relative: 5.6 % (ref 3.0–12.0)
Neutro Abs: 3.1 10*3/uL (ref 1.4–7.7)
Neutrophils Relative %: 58.2 % (ref 43.0–77.0)
Platelets: 295 10*3/uL (ref 150.0–400.0)
RBC: 4.65 Mil/uL (ref 3.87–5.11)
RDW: 15 % (ref 11.5–15.5)
WBC: 5.3 10*3/uL (ref 4.0–10.5)

## 2020-01-05 LAB — HEPATIC FUNCTION PANEL
ALT: 19 U/L (ref 0–35)
AST: 25 U/L (ref 0–37)
Albumin: 4.2 g/dL (ref 3.5–5.2)
Alkaline Phosphatase: 62 U/L (ref 39–117)
Bilirubin, Direct: 0.1 mg/dL (ref 0.0–0.3)
Total Bilirubin: 0.6 mg/dL (ref 0.2–1.2)
Total Protein: 8 g/dL (ref 6.0–8.3)

## 2020-01-05 LAB — HEMOGLOBIN A1C: Hgb A1c MFr Bld: 6 % (ref 4.6–6.5)

## 2020-01-05 LAB — TSH: TSH: 2.13 u[IU]/mL (ref 0.35–4.50)

## 2020-01-05 MED ORDER — BYSTOLIC 20 MG PO TABS: 1.0000 | ORAL_TABLET | Freq: Every day | ORAL | 3 refills | Status: DC

## 2020-01-05 MED ORDER — AMLODIPINE BESYLATE 5 MG PO TABS: 5.0000 mg | ORAL_TABLET | Freq: Every day | ORAL | 3 refills | Status: DC

## 2020-01-05 MED ORDER — POTASSIUM CHLORIDE ER 10 MEQ PO TBCR: 10.0000 meq | EXTENDED_RELEASE_TABLET | Freq: Every day | ORAL | 3 refills | Status: DC

## 2020-01-05 MED ORDER — TRIAMTERENE-HCTZ 37.5-25 MG PO TABS: 1.0000 | ORAL_TABLET | Freq: Every day | ORAL | 3 refills | Status: DC

## 2020-01-05 MED ORDER — METFORMIN HCL 500 MG PO TABS: 500.0000 mg | ORAL_TABLET | Freq: Every day | ORAL | 3 refills | Status: DC

## 2020-01-05 NOTE — Assessment & Plan Note (Signed)
We discussed age appropriate health related issues, including available/recomended screening tests and vaccinations. We discussed a need for adhering to healthy diet and exercise. Labs were ordered to be later reviewed . All questions were answered.  Planning gastric bypass in 2-3 months

## 2020-01-05 NOTE — Assessment & Plan Note (Signed)
Bystolic Triamt-HCT

## 2020-01-05 NOTE — Progress Notes (Signed)
Subjective:  Patient ID: Kim Barnes, female    DOB: 05-Jun-1976  Age: 44 y.o. MRN: [id number]  CC: No chief complaint on file.   HPI Kim Barnes presents for a well exam F/u HTN  Outpatient Medications Prior to Visit  Medication Sig Dispense Refill  . albuterol (PROAIR HFA) 108 (90 Base) MCG/ACT inhaler Inhale 2 puffs into the lungs every 4 (four) hours as needed for wheezing or shortness of breath. 18 g 3  . ALPRAZolam (NIRAVAM) 0.5 MG dissolvable tablet Take 1 tablet (0.5 mg total) by mouth 2 (two) times daily as needed for anxiety. 10 tablet 0  . amLODipine (NORVASC) 5 MG tablet Take 1 tablet (5 mg total) by mouth daily. 30 tablet 0  . buPROPion (WELLBUTRIN SR) 200 MG 12 hr tablet Take 1 tablet (200 mg total) by mouth daily. 30 tablet 0  . BYSTOLIC 20 MG TABS TAKE 1 TABLET BY MOUTH EVERY DAY 30 tablet 11  . famotidine (PEPCID) 20 MG tablet One after supper 30 tablet 11  . ibuprofen (ADVIL,MOTRIN) 200 MG tablet Take 1,200 mg by mouth daily as needed for moderate pain (tooth pain).    . metFORMIN (GLUCOPHAGE) 500 MG tablet Take 1 tablet (500 mg total) by mouth daily with breakfast. 30 tablet 0  . pantoprazole (PROTONIX) 40 MG tablet Take 1 tablet (40 mg total) by mouth daily. Take 30-60 min before first meal of the day 30 tablet 2  . topiramate (TOPAMAX) 25 MG capsule Take 25 mg by mouth 2 (two) times daily.    [name] triamterene-hydrochlorothiazide (MAXZIDE-25) 37.5-25 MG tablet Take 1 tablet by mouth daily. Annual appt due in January must see provider for future refills 30 tablet 1  . Vitamin D, Ergocalciferol, (DRISDOL) 1.25 MG (50000 UT) CAPS capsule TAKE ONE CAPSULE BY MOUTH EVERY 7 DAYS 4 capsule 0   No facility-administered medications prior to visit.    ROS: Review of Systems  Constitutional: Negative for activity change, appetite change, chills, fatigue and unexpected weight change.  HENT: Negative for congestion, mouth sores and sinus pressure.   Eyes: Negative for visual  disturbance.  Respiratory: Negative for cough and chest tightness.   Gastrointestinal: Negative for abdominal pain and nausea.  Genitourinary: Negative for difficulty urinating, frequency and vaginal pain.  Musculoskeletal: Negative for back pain and gait problem.  Skin: Negative for pallor and rash.  Neurological: Negative for dizziness, tremors, weakness, numbness and headaches.  Psychiatric/Behavioral: Negative for confusion and sleep disturbance.    Objective:  BP (!) 132/98 (BP Location: Left Arm, Patient Position: Sitting, Cuff Size: Large)   Pulse 60   Temp 98.5 F (36.9 C) (Oral)   Ht 5\' 6"  (1.676 m)   Wt (!) 384 lb (174.2 kg)   BMI 61.98 kg/m   BP Readings from Last 3 Encounters:  01/05/20 (!) 132/98  11/11/19 126/84  10/16/19 138/86    Wt Readings from Last 3 Encounters:  01/05/20 (!) 384 lb (174.2 kg)  11/11/19 (!) 384 lb (174.2 kg)  10/16/19 (!) 384 lb (174.2 kg)    Physical Exam Constitutional:      General: She is not in acute distress.    Appearance: She is well-developed. She is obese.  HENT:     Head: Normocephalic.     Right Ear: External ear normal.     Left Ear: External ear normal.     Nose: Nose normal.  Eyes:     General:        Right  eye: No discharge.        Left eye: No discharge.     Conjunctiva/sclera: Conjunctivae normal.     Pupils: Pupils are equal, round, and reactive to light.  Neck:     Thyroid: No thyromegaly.     Vascular: No JVD.     Trachea: No tracheal deviation.  Cardiovascular:     Rate and Rhythm: Normal rate and regular rhythm.     Heart sounds: Normal heart sounds.  Pulmonary:     Effort: No respiratory distress.     Breath sounds: No stridor. No wheezing.  Abdominal:     General: Bowel sounds are normal. There is no distension.     Palpations: Abdomen is soft. There is no mass.     Tenderness: There is no abdominal tenderness. There is no guarding or rebound.  Musculoskeletal:        General: No tenderness.      Cervical back: Normal range of motion and neck supple.  Lymphadenopathy:     Cervical: No cervical adenopathy.  Skin:    Findings: No erythema or rash.  Neurological:     Cranial Nerves: No cranial nerve deficit.     Motor: No abnormal muscle tone.     Coordination: Coordination normal.     Deep Tendon Reflexes: Reflexes normal.  Psychiatric:        Behavior: Behavior normal.        Thought Content: Thought content normal.        Judgment: Judgment normal.     Lab Results  Component Value Date   WBC 5.4 02/26/2018   HGB 13.6 02/26/2018   HCT 42.2 02/26/2018   PLT 281.0 12/31/2017   GLUCOSE 80 03/10/2019   CHOL 216 (H) 03/10/2019   TRIG 81 03/10/2019   HDL 55 03/10/2019   LDLDIRECT 149.4 05/24/2010   LDLCALC 145 (H) 03/10/2019   ALT 17 03/10/2019   AST 20 03/10/2019   NA 147 (H) 03/10/2019   K 4.3 03/10/2019   CL 104 03/10/2019   CREATININE 0.79 03/10/2019   BUN 12 03/10/2019   CO2 26 03/10/2019   TSH 3.240 03/10/2019   INR 1.16 02/04/2017   HGBA1C 5.8 (H) 03/10/2019    US BREAST LTD UNI LEFT INC AXILLA  Result Date: 10/02/2019 CLINICAL DATA:  44 year old patient recalled from recent baseline screening mammogram for evaluation bilateral breast masses. The patient shows me a chronic sebaceous cyst on the lower inner quadrant of her left breast today. EXAM: DIGITAL DIAGNOSTIC BILATERAL MAMMOGRAM WITH CAD AND TOMO ULTRASOUND BILATERAL BREAST COMPARISON:  Baseline screening mammogram September 29, 2019 ACR Breast Density Category a: The breast tissue is almost entirely fatty. FINDINGS: Additional views of the left breast show a superficially positioned oval circumscribed approximately 0.8 cm mass in the lower inner quadrant. Additional mammographic views of the right breast show a discrete circumscribed oval approximately 0.7 cm mass in the upper slightly inner right breast. Slightly lateral to this 0.7 cm mass is a second lower density, possibly fat containing mass in the  upper central right breast that is circumscribed, oval, and measures approximately 7 mm. Mammographic images were processed with CAD. On physical exam, there is a visible sebaceous cyst in the lower inner quadrant of the left breast, 8 o'clock region. Targeted ultrasound is performed, showing a circumscribed oval hypoechoic mass within the dermis of the left breast 8 o'clock position 8 cm from the nipple measuring 1.2 x 0.3 x 0.9 cm. This correlates with the  mammographically detected mass and is consistent with a benign sebaceous cyst. Ultrasound of the right breast shows a circumscribed hypoechoic parallel oval mass at 11:30 position measuring 0.7 x 0.4 x 0.6 cm. This has an appearance suggestive of a benign fibroadenoma and shows no internal vascular flow. At 12:30 position 9 cm from the nipple is a 0.5 x 0.2 x 0.4 cm circumscribed oval mass, possibly a complicated cyst or fibroadenoma and may correlate with the low-density mass seen on the mammogram. No additional masses are identified in the superior right breast. IMPRESSION: 1. Benign sebaceous cyst 8 o'clock position left breast. 2. Probably benign mass 11:30 position right breast, favored to be a fibroadenoma. Well visualized on mammography and ultrasound. 3. Probably benign low-density mass on mammography in the upper central right breast, with possible ultrasound correlate at 12:30 position 9 cm from the nipple on ultrasound. RECOMMENDATION: Diagnostic right mammogram and possible right breast ultrasound in 6 months is recommended to evaluate the 2 probably benign masses in the right breast. I have discussed the findings and recommendations with the patient. If applicable, a reminder letter will be sent to the patient regarding the next appointment. BI-RADS CATEGORY  3: Probably benign. Electronically Signed   By: Britta Mccreedy M.D.   On: 10/02/2019 15:33   US BREAST LTD UNI RIGHT INC AXILLA  Result Date: 10/02/2019 CLINICAL DATA:  44 year old patient  recalled from recent baseline screening mammogram for evaluation bilateral breast masses. The patient shows me a chronic sebaceous cyst on the lower inner quadrant of her left breast today. EXAM: DIGITAL DIAGNOSTIC BILATERAL MAMMOGRAM WITH CAD AND TOMO ULTRASOUND BILATERAL BREAST COMPARISON:  Baseline screening mammogram September 29, 2019 ACR Breast Density Category a: The breast tissue is almost entirely fatty. FINDINGS: Additional views of the left breast show a superficially positioned oval circumscribed approximately 0.8 cm mass in the lower inner quadrant. Additional mammographic views of the right breast show a discrete circumscribed oval approximately 0.7 cm mass in the upper slightly inner right breast. Slightly lateral to this 0.7 cm mass is a second lower density, possibly fat containing mass in the upper central right breast that is circumscribed, oval, and measures approximately 7 mm. Mammographic images were processed with CAD. On physical exam, there is a visible sebaceous cyst in the lower inner quadrant of the left breast, 8 o'clock region. Targeted ultrasound is performed, showing a circumscribed oval hypoechoic mass within the dermis of the left breast 8 o'clock position 8 cm from the nipple measuring 1.2 x 0.3 x 0.9 cm. This correlates with the mammographically detected mass and is consistent with a benign sebaceous cyst. Ultrasound of the right breast shows a circumscribed hypoechoic parallel oval mass at 11:30 position measuring 0.7 x 0.4 x 0.6 cm. This has an appearance suggestive of a benign fibroadenoma and shows no internal vascular flow. At 12:30 position 9 cm from the nipple is a 0.5 x 0.2 x 0.4 cm circumscribed oval mass, possibly a complicated cyst or fibroadenoma and may correlate with the low-density mass seen on the mammogram. No additional masses are identified in the superior right breast. IMPRESSION: 1. Benign sebaceous cyst 8 o'clock position left breast. 2. Probably benign mass  11:30 position right breast, favored to be a fibroadenoma. Well visualized on mammography and ultrasound. 3. Probably benign low-density mass on mammography in the upper central right breast, with possible ultrasound correlate at 12:30 position 9 cm from the nipple on ultrasound. RECOMMENDATION: Diagnostic right mammogram and possible right breast ultrasound in  6 months is recommended to evaluate the 2 probably benign masses in the right breast. I have discussed the findings and recommendations with the patient. If applicable, a reminder letter will be sent to the patient regarding the next appointment. BI-RADS CATEGORY  3: Probably benign. Electronically Signed   By: Britta Mccreedy M.D.   On: 10/02/2019 15:33   MM DIAG BREAST TOMO BILATERAL  Result Date: 10/02/2019 CLINICAL DATA:  44 year old patient recalled from recent baseline screening mammogram for evaluation bilateral breast masses. The patient shows me a chronic sebaceous cyst on the lower inner quadrant of her left breast today. EXAM: DIGITAL DIAGNOSTIC BILATERAL MAMMOGRAM WITH CAD AND TOMO ULTRASOUND BILATERAL BREAST COMPARISON:  Baseline screening mammogram September 29, 2019 ACR Breast Density Category a: The breast tissue is almost entirely fatty. FINDINGS: Additional views of the left breast show a superficially positioned oval circumscribed approximately 0.8 cm mass in the lower inner quadrant. Additional mammographic views of the right breast show a discrete circumscribed oval approximately 0.7 cm mass in the upper slightly inner right breast. Slightly lateral to this 0.7 cm mass is a second lower density, possibly fat containing mass in the upper central right breast that is circumscribed, oval, and measures approximately 7 mm. Mammographic images were processed with CAD. On physical exam, there is a visible sebaceous cyst in the lower inner quadrant of the left breast, 8 o'clock region. Targeted ultrasound is performed, showing a circumscribed  oval hypoechoic mass within the dermis of the left breast 8 o'clock position 8 cm from the nipple measuring 1.2 x 0.3 x 0.9 cm. This correlates with the mammographically detected mass and is consistent with a benign sebaceous cyst. Ultrasound of the right breast shows a circumscribed hypoechoic parallel oval mass at 11:30 position measuring 0.7 x 0.4 x 0.6 cm. This has an appearance suggestive of a benign fibroadenoma and shows no internal vascular flow. At 12:30 position 9 cm from the nipple is a 0.5 x 0.2 x 0.4 cm circumscribed oval mass, possibly a complicated cyst or fibroadenoma and may correlate with the low-density mass seen on the mammogram. No additional masses are identified in the superior right breast. IMPRESSION: 1. Benign sebaceous cyst 8 o'clock position left breast. 2. Probably benign mass 11:30 position right breast, favored to be a fibroadenoma. Well visualized on mammography and ultrasound. 3. Probably benign low-density mass on mammography in the upper central right breast, with possible ultrasound correlate at 12:30 position 9 cm from the nipple on ultrasound. RECOMMENDATION: Diagnostic right mammogram and possible right breast ultrasound in 6 months is recommended to evaluate the 2 probably benign masses in the right breast. I have discussed the findings and recommendations with the patient. If applicable, a reminder letter will be sent to the patient regarding the next appointment. BI-RADS CATEGORY  3: Probably benign. Electronically Signed   By: Britta Mccreedy M.D.   On: 10/02/2019 15:33    Assessment & Plan:   There are no diagnoses linked to this encounter.   No orders of the defined types were placed in this encounter.    Follow-up: No follow-ups on file.  Sonda Primes, MD

## 2020-01-05 NOTE — Assessment & Plan Note (Signed)
  Planning gastric bypass in 2-3 months 

## 2020-01-05 NOTE — Assessment & Plan Note (Signed)
Xanax prn 

## 2020-01-05 NOTE — Assessment & Plan Note (Signed)
  Planning gastric bypass in 2-3 months

## 2020-01-12 ENCOUNTER — Telehealth: Payer: Self-pay | Admitting: Internal Medicine

## 2020-01-12 NOTE — Telephone Encounter (Signed)
    Pt c/o medication issue:  1. Name of Medication: Nebivolol HCl (BYSTOLIC) 20 MG TABS  2. How are you currently taking this medication (dosage and times per day)? N/A  3. Are you having a reaction (difficulty breathing--STAT)? NO  4. What is your medication issue? Patient states medication no longer covered on her insurance plan. Requesting new RX be sent to [facility] on [address]  No longer uses [facility]

## 2020-01-13 MED ORDER — CARVEDILOL 25 MG PO TABS: 25.0000 mg | ORAL_TABLET | Freq: Two times a day (BID) | ORAL | 3 refills | Status: DC

## 2020-01-13 NOTE — Telephone Encounter (Signed)
Noted.  We will have to switch to Coreg.  Prescription emailed - thanks

## 2020-01-14 NOTE — Telephone Encounter (Signed)
Notified pt of med change../lmb 

## 2020-01-22 ENCOUNTER — Other Ambulatory Visit: Payer: 59

## 2020-01-27 ENCOUNTER — Ambulatory Visit: Payer: 59 | Attending: Internal Medicine

## 2020-01-27 DIAGNOSIS — Z20822 Contact with and (suspected) exposure to covid-19: Secondary | ICD-10-CM

## 2020-01-29 LAB — NOVEL CORONAVIRUS, NAA: SARS-CoV-2, NAA: NOT DETECTED

## 2020-02-09 ENCOUNTER — Other Ambulatory Visit: Payer: Self-pay

## 2020-02-09 ENCOUNTER — Encounter: Payer: Self-pay | Admitting: Internal Medicine

## 2020-02-09 ENCOUNTER — Ambulatory Visit (INDEPENDENT_AMBULATORY_CARE_PROVIDER_SITE_OTHER): Payer: 59 | Admitting: Internal Medicine

## 2020-02-09 DIAGNOSIS — R058 Other specified cough: Secondary | ICD-10-CM

## 2020-02-09 DIAGNOSIS — R05 Cough: Secondary | ICD-10-CM | POA: Diagnosis not present

## 2020-02-09 NOTE — Patient Instructions (Signed)
At the very first sign of a flare >  Protonix (pantoprazole) Take 30-60 min before first meal of the day and Pepcid 20 mg one after supper nightly  GERD (REFLUX)  is an extremely common cause of respiratory symptoms just like yours , many times with no obvious heartburn at all.    It can be treated with medication, but also with lifestyle changes including elevation of the head of your bed (ideally with 6 -8inch blocks under the headboard of your bed),  Smoking cessation, avoidance of late meals, excessive alcohol, and avoid fatty foods, chocolate, peppermint, colas, red wine, and acidic juices such as orange juice.  NO MINT OR MENTHOL PRODUCTS SO NO COUGH DROPS  USE SUGARLESS CANDY INSTEAD (Jolley ranchers or Stover's or Life Savers) or even ice chips will also do - the key is to swallow to prevent all throat clearing. NO OIL BASED VITAMINS - use powdered substitutes.  Avoid fish oil when coughing.   If you are satisfied with your treatment plan,  let your doctor know and he/she can either refill your medications or you can return here when your prescription runs out.     If in any way you are not 100% satisfied,  please tell us.  If 100% better, tell your friends!  Pulmonary follow up is as needed

## 2020-02-09 NOTE — Progress Notes (Signed)
Kim Barnes, female    DOB: [birth year],     MRN: [id number]   Brief patient profile:  61 yobf  Never smoker with asthma as child on prn saba eval by Dr Annamaria Boots on shots in elementary school  by HS gone but by her 61s would note cough esp in winter typically resolved with pred and cough suppression evaluated 05/2014 rec rx for UACS with gerd rx and cyclical cough  but better  For months including  From Jan 2020 until Aug 2020 s maint rx of any kind  then worse since then  rx with cough drops and saba so self referred back 10/13/2019       History of Present Illness  10/13/2019  Pulmonary/ 1st office eval/Kim Barnes   Chief Complaint  Patient presents with  . Pulmonary Consult    Last seen here in 2015. Pt c/o increased cough since Sept 2020.  Cough is non prod and she also c/o wheezing. She is using her albuterol inhaler at least 2 x per day.   Dyspnea:  MMRC1 = can walk nl pace, flat grade, can't hurry or go uphills or steps s sob   Cough: esp evening better p sleeping no am flare / feels like something's stuck in throat but min actual mucoid sputum produced  Sleep: on side bed flat ok  SABA use: 2-4 x per day does seem to help rec First take delsym two tsp every 12 hours and supplement if needed with  tramadol 50 mg up to 1 every 4 hours to suppress the urge to cough at all or even clear your throat Prednisone 10 mg take  4 each am x 2 days,   2 each am x 2 days,  1 each am x 2 days and stop (this is to eliminate allergies and inflammation from coughing) Protonix (pantoprazole) Take 30-60 min before first meal of the day and Pepcid 20 mg one after supper nightly GERD diet    11/11/2019  f/u ov/Kim Barnes re: uacs  Resolved / maint just on gerd rx  Chief Complaint  Patient presents with  . Follow-up    Cough is much improved. She has only used her albuterol once since the last visit.   Dyspnea:  Not limited by breathing from desired activities   Cough: gone/ still occ pnds sensation  Sleeping: bed  is flat / no resp symptoms  SABA use: rarely  02: none  rec No change in meds x 6 weeks and if all better after the holidays ok to stop am dose of protonix   02/09/2020  f/u ov/Kim Barnes re: uacs cough off gerd rx  Chief Complaint  Patient presents with  . Follow-up    Cough has resolved and no new co's.   Dyspnea:  Not limited by breathing from desired activities  But very sedentary Cough: minimal/ tickle better  Sleeping: bed is flat/ no resp symptoms SABA use: none  02: none     No obvious day to day or daytime variability or assoc excess/ purulent sputum or mucus plugs or hemoptysis or cp or chest tightness, subjective wheeze or overt sinus or hb symptoms.   Sleeping as above without nocturnal  or early am exacerbation  of respiratory  c/o's or need for noct saba. Also denies any obvious fluctuation of symptoms with weather or environmental changes or other aggravating or alleviating factors except as outlined above   No unusual exposure hx or h/o childhood pna/ or knowledge of premature birth.  Current Allergies, Complete Past Medical History, Past Surgical History, Family History, and Social History were reviewed in [employer] record.  ROS  The following are not active complaints unless bolded Hoarseness, sore throat, dysphagia, dental problems, itching, sneezing,  nasal congestion or discharge of excess mucus or purulent secretions, ear ache,   fever, chills, sweats, unintended wt loss or wt gain, classically pleuritic or exertional cp,  orthopnea pnd or arm/hand swelling  or leg swelling, presyncope, palpitations, abdominal pain, anorexia, nausea, vomiting, diarrhea  or change in bowel habits or change in bladder habits, change in stools or change in urine, dysuria, hematuria,  rash, arthralgias, visual complaints, headache, numbness, weakness or ataxia or problems with walking or coordination,  change in mood or  memory.        Current Meds  Medication Sig    . albuterol (PROAIR HFA) 108 (90 Base) MCG/ACT inhaler Inhale 2 puffs into the lungs every 4 (four) hours as needed for wheezing or shortness of breath.  . ALPRAZolam (NIRAVAM) 0.5 MG dissolvable tablet Take 1 tablet (0.5 mg total) by mouth 2 (two) times daily as needed for anxiety.  [name] amLODipine (NORVASC) 5 MG tablet Take 1 tablet (5 mg total) by mouth daily.  . carvedilol (COREG) 25 MG tablet Take 1 tablet (25 mg total) by mouth 2 (two) times daily with a meal.  . famotidine (PEPCID) 20 MG tablet One after supper  . ibuprofen (ADVIL,MOTRIN) 200 MG tablet Take 1,200 mg by mouth daily as needed for moderate pain (tooth pain).  . metFORMIN (GLUCOPHAGE) 500 MG tablet Take 1 tablet (500 mg total) by mouth daily with breakfast.  . pantoprazole (PROTONIX) 40 MG tablet Take 1 tablet (40 mg total) by mouth daily. Take 30-60 min before first meal of the day  . potassium chloride (KLOR-CON) 10 MEQ tablet Take 1 tablet (10 mEq total) by mouth daily.  [name] topiramate (TOPAMAX) 25 MG capsule Take 25 mg by mouth 2 (two) times daily.  [name] triamterene-hydrochlorothiazide (MAXZIDE-25) 37.5-25 MG tablet Take 1 tablet by mouth daily. Annual appt due in January must see provider for future refills  . Vitamin D, Ergocalciferol, (DRISDOL) 1.25 MG (50000 UT) CAPS capsule TAKE ONE CAPSULE BY MOUTH EVERY 7 DAYS                     Past Medical History:  Diagnosis Date  . Anxiety   . Asthma    prn inhaler  . Back pain   . Bell's palsy   . Carpal tunnel syndrome of left wrist 01/2013  . Chest pain   . Cold hands and feet   . Cough   . Depression   . Dry mouth   . Dry skin   . Fatigue   . GERD (gastroesophageal reflux disease)    occasional; no current med.  [name] History of brain tumor 1996   no neurologic deficits  . HTN (hypertension)    under control with med., has been on med. x 5 yr.  . Joint pain   . Obesity   . Obstructive sleep apnea   . PCOS (polycystic ovarian syndrome)   . Polycystic ovarian  syndrome    no current med.; irregular periods  . Shortness of breath   . Stress   . Swelling of extremity   . Wheezing        Objective:     Obese pleasant bf nad/ occ spont dry cough   02/09/2020  386  11/11/2019           384   10/13/19 (!) 387 lb (175.5 kg)  10/06/19 (!) 373 lb (169.2 kg)  09/08/19 (!) 376 lb (170.6 kg)    Vital signs reviewed  02/09/2020  - Note at rest 02 sats  100% on RA   HEENT : pt wearing mask not removed for exam due to covid -19 concerns.    NECK :  without JVD/Nodes/TM/ nl carotid upstrokes bilaterally   LUNGS: no acc muscle use,  Nl contour chest which is clear to A and P bilaterally without cough on insp or exp maneuvers   CV:  RRR  no s3 or murmur or increase in P2, and no edema   ABD:  soft and nontender with nl inspiratory excursion in the supine position. No bruits or organomegaly appreciated, bowel sounds nl  MS:  Nl gait/ ext warm without deformities, calf tenderness, cyanosis or clubbing No obvious joint restrictions   SKIN: warm and dry without lesions    NEURO:  alert, approp, nl sensorium with  no motor or cerebellar deficits apparent.        Assessment

## 2020-02-11 ENCOUNTER — Encounter: Payer: Self-pay | Admitting: Internal Medicine

## 2020-02-11 NOTE — Assessment & Plan Note (Signed)
-   cyclical cough regimen 05/24/14 > resolved - onset Aug 2020 > repeat cyclical cough rx 10/13/2019 > cough resolved so rec 3 months gerd rx then f/u > resolved as of 02/09/2020 off gerd rx   Of the three most common causes of  Sub-acute / recurrent or chronic cough, only one (GERD)  can actually contribute to/ trigger  the other two (asthma and post nasal drip syndrome)  and perpetuate the cylce of cough.  While not intuitively obvious, many patients with chronic low grade reflux do not cough until there is a primary insult that disturbs the protective epithelial barrier and exposes sensitive nerve endings.   This is typically viral but can due to PNDS and  either may apply here.     >>>the point is that once this occurs, it is difficult to eliminate the cycle  using anything but a maximally effective acid suppression regimen at least in the short run, accompanied by an appropriate diet to address non acid GERD and control / eliminate the cough itself for at least 3 days with non-mint / menthol hard rock candies and avoid throat clearing and f/u here prn          Each maintenance medication was reviewed in detail including emphasizing most importantly the difference between maintenance and prns and under what circumstances the prns are to be triggered using an action plan format where appropriate.  Total time for H and P, chart review, counseling, contingency planning   and generating customized AVS unique to this summery final  office visit / charting = 20 min

## 2020-04-04 ENCOUNTER — Ambulatory Visit
Admission: RE | Admit: 2020-04-04 | Discharge: 2020-04-04 | Disposition: A | Discharge status: Home / Self Care | Payer: 59 | Source: Ambulatory Visit | Attending: Obstetrics & Gynecology | Admitting: Obstetrics & Gynecology

## 2020-04-04 ENCOUNTER — Other Ambulatory Visit: Payer: Self-pay

## 2020-04-04 ENCOUNTER — Other Ambulatory Visit: Payer: Self-pay | Admitting: Obstetrics & Gynecology

## 2020-04-04 DIAGNOSIS — N6310 Unspecified lump in the right breast, unspecified quadrant: Secondary | ICD-10-CM

## 2020-10-05 ENCOUNTER — Ambulatory Visit
Admission: RE | Admit: 2020-10-05 | Discharge: 2020-10-05 | Disposition: A | Discharge status: Home / Self Care | Payer: 59 | Source: Ambulatory Visit | Attending: Obstetrics & Gynecology | Admitting: Obstetrics & Gynecology

## 2020-10-05 ENCOUNTER — Other Ambulatory Visit: Payer: Self-pay

## 2020-10-05 DIAGNOSIS — N6310 Unspecified lump in the right breast, unspecified quadrant: Secondary | ICD-10-CM

## 2020-10-17 ENCOUNTER — Other Ambulatory Visit: Payer: Self-pay

## 2020-10-17 ENCOUNTER — Encounter: Payer: Self-pay | Admitting: Obstetrics & Gynecology

## 2020-10-17 ENCOUNTER — Ambulatory Visit (INDEPENDENT_AMBULATORY_CARE_PROVIDER_SITE_OTHER): Payer: 59 | Admitting: Obstetrics & Gynecology

## 2020-10-17 VITALS — BP 140/88 | Ht 66.0 in | Wt 358.0 lb

## 2020-10-17 DIAGNOSIS — Z01419 Encounter for gynecological examination (general) (routine) without abnormal findings: Secondary | ICD-10-CM

## 2020-10-17 DIAGNOSIS — Z30011 Encounter for initial prescription of contraceptive pills: Secondary | ICD-10-CM | POA: Diagnosis not present

## 2020-10-17 MED ORDER — NORETHINDRONE 0.35 MG PO TABS: 1.0000 | ORAL_TABLET | Freq: Every day | ORAL | 4 refills | Status: DC

## 2020-10-17 NOTE — Progress Notes (Signed)
Kim Barnes 10-25-1976 [id number]   History:    44 y.o. G2P0A2 Stable boyfriend x 8 yrs  RP:  Established patient presenting for annual gyn exam   HPI: Menses every month, normal flow, no BTB.  No pelvic pain.  Would like to start with contraception.  Breasts normal.  BMI 61.98 last year, decreased to 57.78.  Seen by Bariatric Surgeon, surgery scheduled 12/06/2020. Health labs with family physician.   Past medical history,surgical history, family history and social history were all reviewed and documented in the EPIC chart.  Gynecologic History Patient's last menstrual period was 09/19/2020.  Obstetric History OB History  Gravida Para Term Preterm AB Living  2       2 0  SAB TAB Ectopic Multiple Live Births  1 1     0    # Outcome Date GA Lbr Len/2nd Weight Sex Delivery Anes PTL Lv  2 SAB           1 TAB              ROS: A ROS was performed and pertinent positives and negatives are included in the history.  GENERAL: No fevers or chills. HEENT: No change in vision, no earache, sore throat or sinus congestion. NECK: No pain or stiffness. CARDIOVASCULAR: No chest pain or pressure. No palpitations. PULMONARY: No shortness of breath, cough or wheeze. GASTROINTESTINAL: No abdominal pain, nausea, vomiting or diarrhea, melena or bright red blood per rectum. GENITOURINARY: No urinary frequency, urgency, hesitancy or dysuria. MUSCULOSKELETAL: No joint or muscle pain, no back pain, no recent trauma. DERMATOLOGIC: No rash, no itching, no lesions. ENDOCRINE: No polyuria, polydipsia, no heat or cold intolerance. No recent change in weight. HEMATOLOGICAL: No anemia or easy bruising or bleeding. NEUROLOGIC: No headache, seizures, numbness, tingling or weakness. PSYCHIATRIC: No depression, no loss of interest in normal activity or change in sleep pattern.     Exam:   BP 140/88   Ht 5\' 6"  (1.676 m)   Wt (!) 358 lb (162.4 kg)   LMP 09/19/2020 Comment: NO BIRTH CONTROL   BMI 57.78 kg/m     Body mass index is 57.78 kg/m.  General appearance : Well developed well nourished female. No acute distress HEENT: Eyes: no retinal hemorrhage or exudates,  Neck supple, trachea midline, no carotid bruits, no thyroidmegaly Lungs: Clear to auscultation, no rhonchi or wheezes, or rib retractions  Heart: Regular rate and rhythm, no murmurs or gallops Breast:Examined in sitting and supine position were symmetrical in appearance, no palpable masses or tenderness,  no skin retraction, no nipple inversion, no nipple discharge, no skin discoloration, no axillary or supraclavicular lymphadenopathy Abdomen: no palpable masses or tenderness, no rebound or guarding Extremities: no edema or skin discoloration or tenderness  Pelvic: Vulva: Normal             Vagina: No gross lesions or discharge  Cervix: No gross lesions or discharge  Uterus  AV, normal size, shape and consistency, non-tender and mobile  Adnexa  Without masses or tenderness  Anus: Normal   Assessment/Plan:  44 y.o. female for annual exam   1. Well female exam with routine gynecological exam Normal gynecologic exam.  No indication for Pap test this year.  Breast exam normal.  Bilateral diagnostic mammogram probably benign October 2021, will repeat at 1 year.  Health labs with family physician.  2. Encounter for initial prescription of contraceptive pills Counseling on contraception done.  Given that patient will have a bariatric  surgery December 28th 2021, decision to start on the progestin only birth control pill.  No contraindication.  Usage reviewed.  Prescription sent to pharmacy.  3. Morbid obesity due to excess calories (HCC) Improvement in body mass index currently at 57.78.  Patient preparing for bariatric surgery December 06, 2020.  Other orders - norethindrone (MICRONOR) 0.35 MG tablet; Take 1 tablet (0.35 mg total) by mouth daily.  [name] MD, 8:50 AM 10/17/2020

## 2020-10-18 ENCOUNTER — Encounter: Payer: 59 | Admitting: Obstetrics & Gynecology

## 2020-10-24 ENCOUNTER — Other Ambulatory Visit: Payer: 59

## 2020-10-24 DIAGNOSIS — Z20822 Contact with and (suspected) exposure to covid-19: Secondary | ICD-10-CM

## 2020-10-25 LAB — NOVEL CORONAVIRUS, NAA: SARS-CoV-2, NAA: NOT DETECTED

## 2020-10-25 LAB — SARS-COV-2, NAA 2 DAY TAT

## 2020-12-14 ENCOUNTER — Other Ambulatory Visit: Payer: 59

## 2020-12-14 DIAGNOSIS — Z20822 Contact with and (suspected) exposure to covid-19: Secondary | ICD-10-CM

## 2020-12-16 ENCOUNTER — Telehealth: Payer: Self-pay | Admitting: *Deleted

## 2020-12-16 LAB — NOVEL CORONAVIRUS, NAA: SARS-CoV-2, NAA: NOT DETECTED

## 2020-12-16 LAB — SARS-COV-2, NAA 2 DAY TAT

## 2020-12-16 MED ORDER — NORELGESTROMIN-ETH ESTRADIOL 150-35 MCG/24HR TD PTWK: 1.0000 | MEDICATED_PATCH | TRANSDERMAL | 4 refills | Status: DC

## 2020-12-16 NOTE — Telephone Encounter (Signed)
-----   Message from [name], MD sent at 12/15/2020 11:10 AM EST ----- Regarding: Prescription Please send a prescription of the Xulane patch  #12, refill x 4.

## 2021-01-14 ENCOUNTER — Other Ambulatory Visit: Payer: 59

## 2021-01-14 DIAGNOSIS — Z20822 Contact with and (suspected) exposure to covid-19: Secondary | ICD-10-CM

## 2021-01-15 LAB — SARS-COV-2, NAA 2 DAY TAT

## 2021-01-15 LAB — NOVEL CORONAVIRUS, NAA: SARS-CoV-2, NAA: NOT DETECTED

## 2021-01-22 IMAGING — MG DIGITAL SCREENING BILAT W/ TOMO W/ CAD
6 of 12 series · 6 of 36 positions shown · non-contrast
Comparison: None.

CLINICAL DATA: Screening.

EXAM:
DIGITAL SCREENING BILATERAL MAMMOGRAM WITH TOMO AND CAD

[L CC synth-2D (1 of 2)]
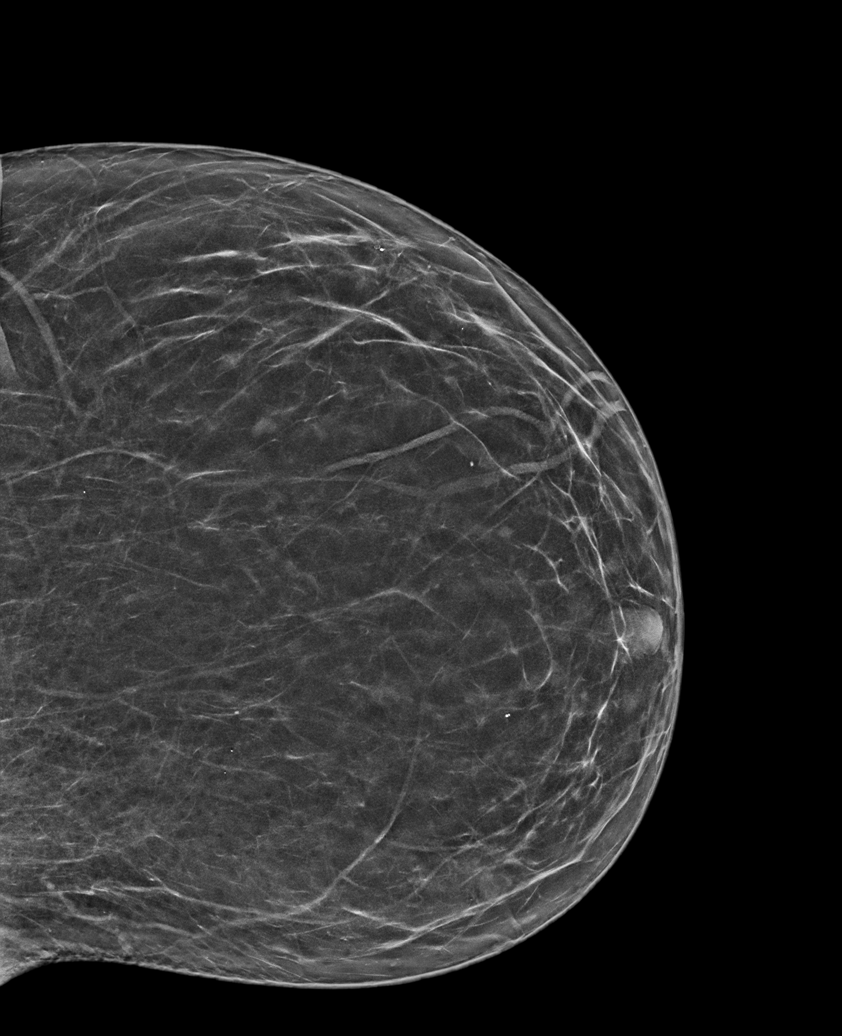

[L CC synth-2D (2 of 2)]
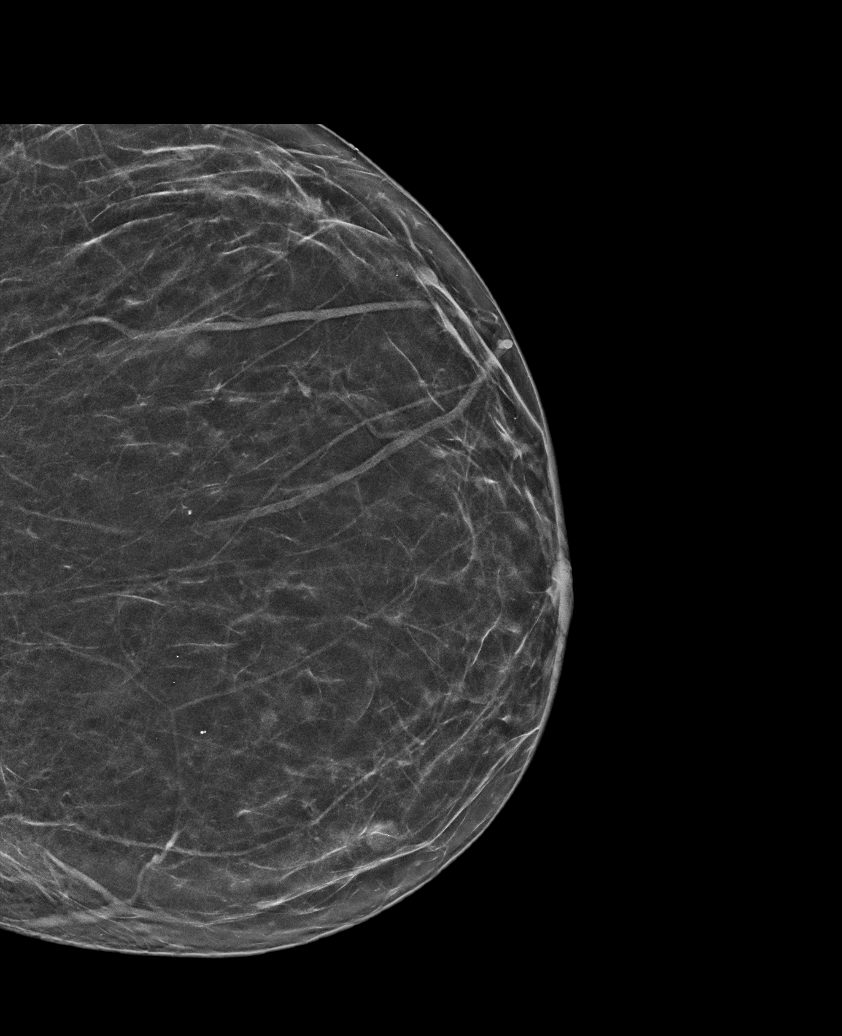

[R MLO synth-2D]
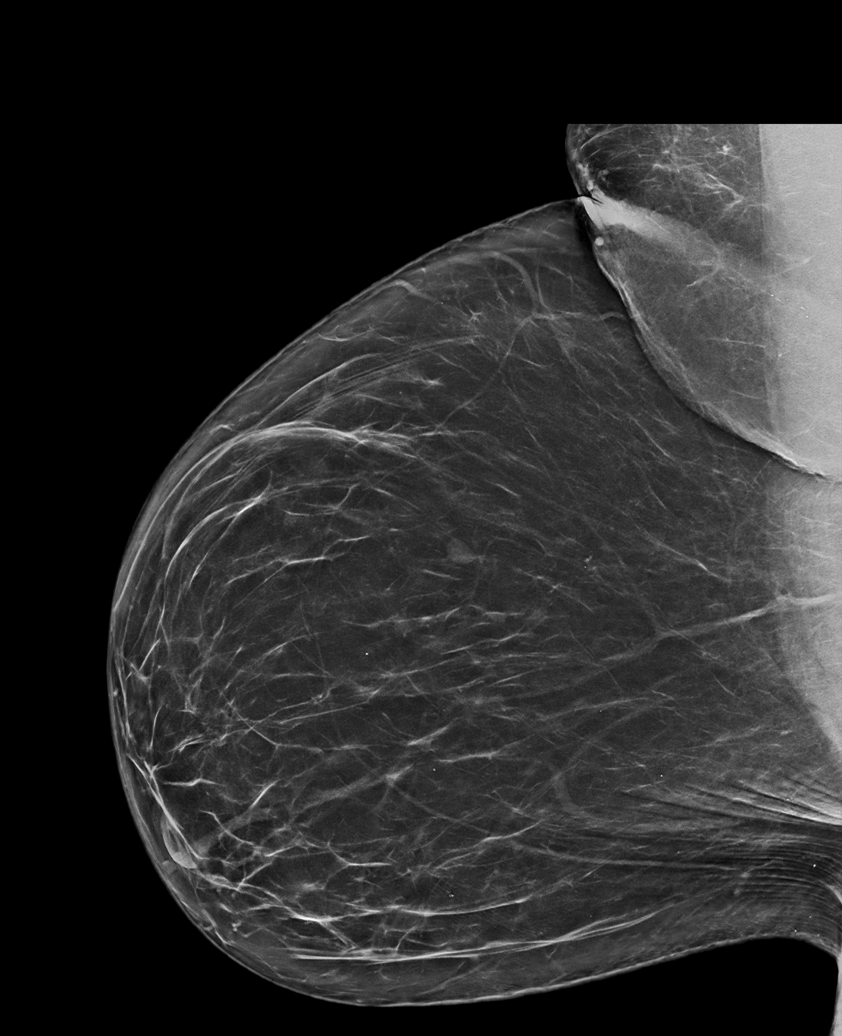

[R CC synth-2D (1 of 2)]
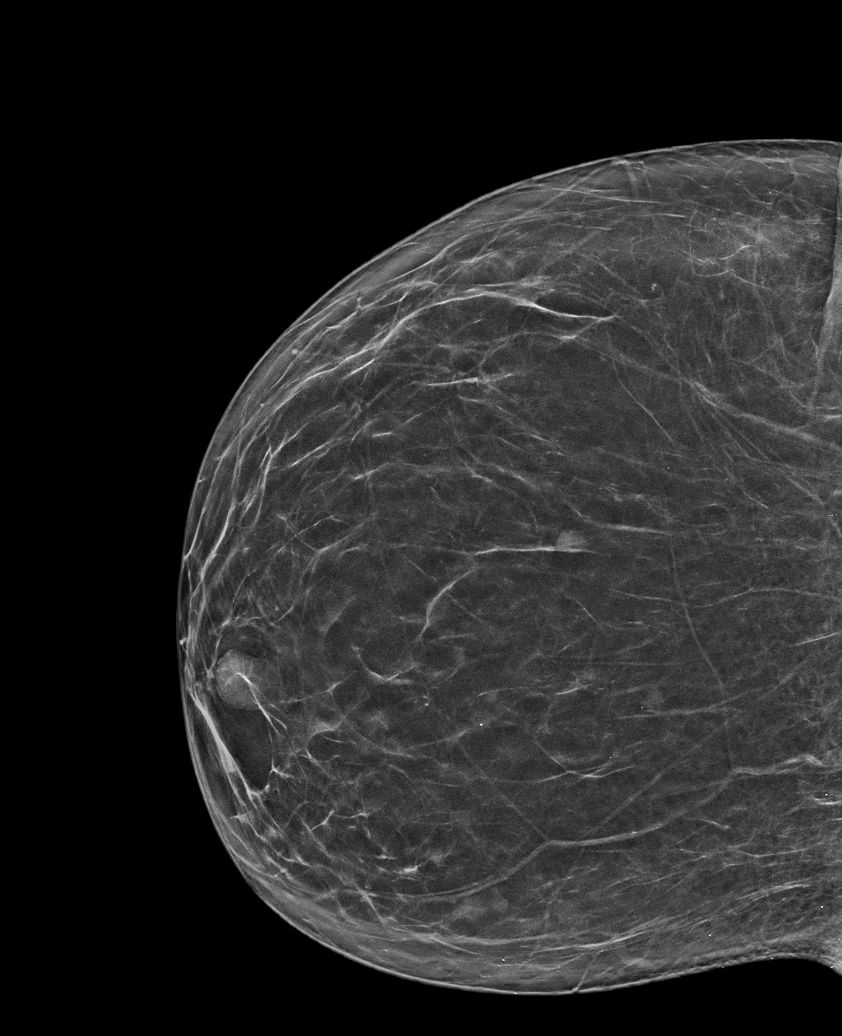

[R CC synth-2D (2 of 2)]
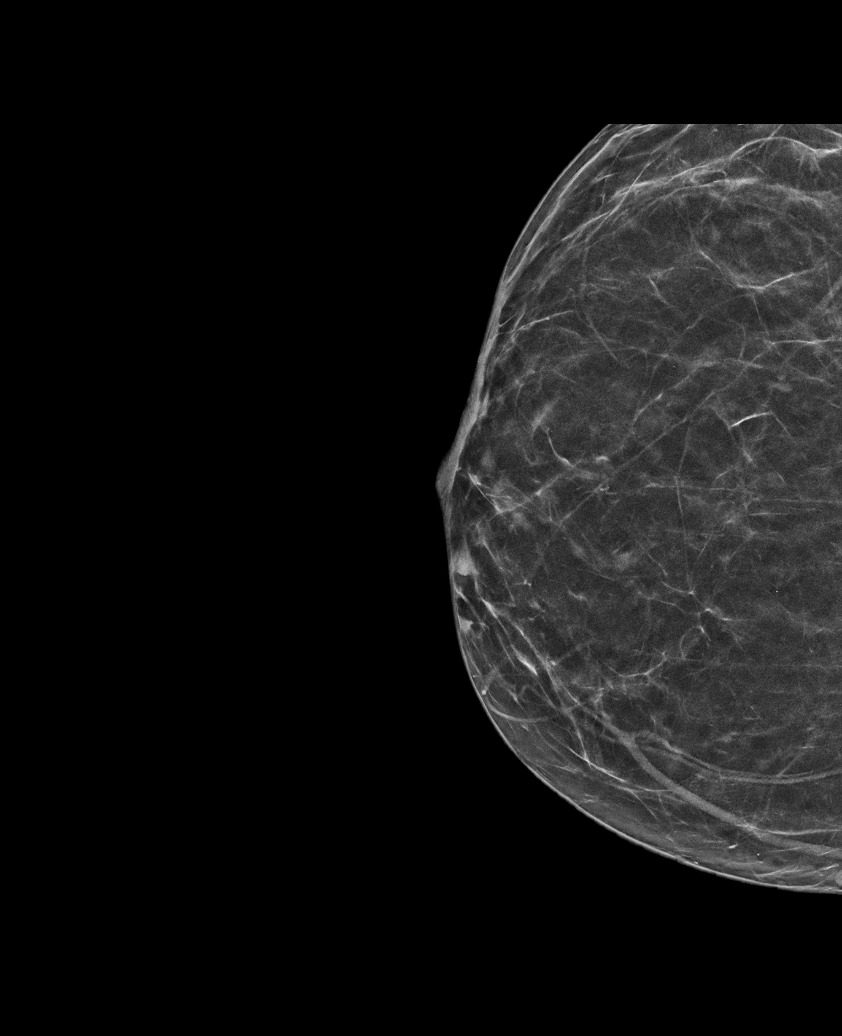

[L MLO synth-2D]
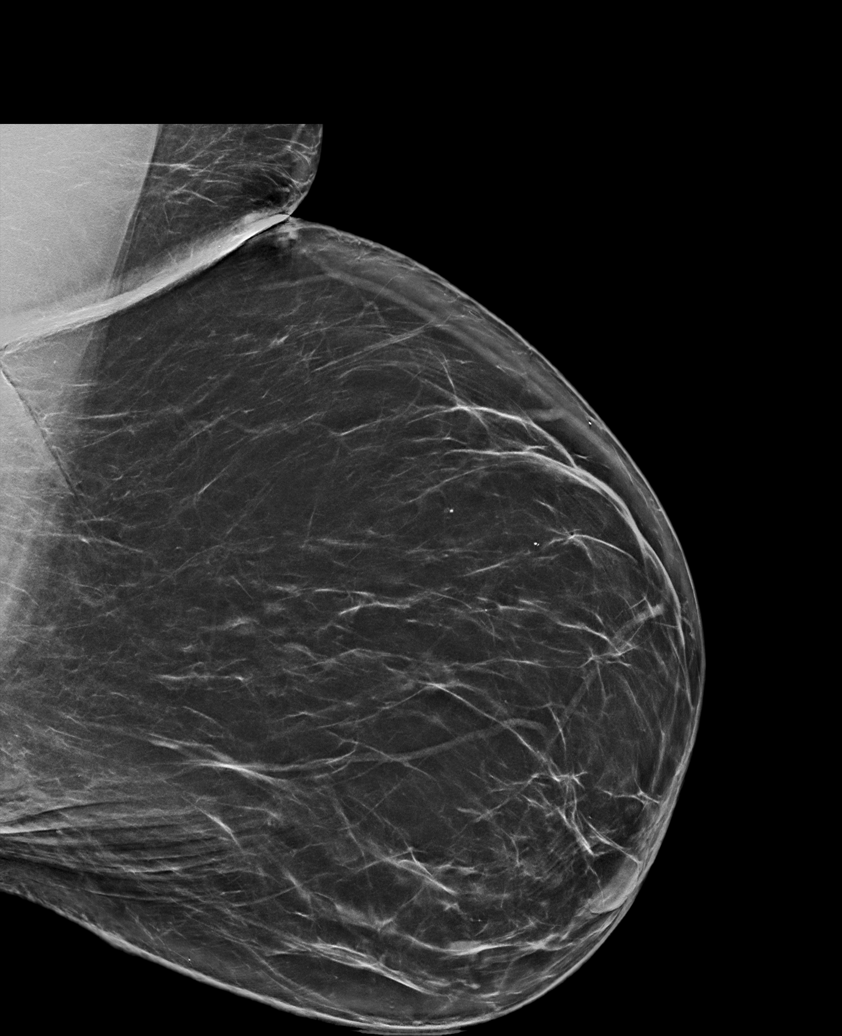

[6 of 36 positions shown; findings below may reference images not displayed]

ACR Breast Density Category b: There are scattered areas of
fibroglandular density.
FINDINGS: In the right breast masses requires further evaluation.

In the left breast a mass requires further evaluation.

Images were processed with CAD.
IMPRESSION: Further evaluation is suggested for possible masses in the right
breast.

Further evaluation is suggested for possible mass in the left
breast.

RECOMMENDATION:
Diagnostic mammogram and possibly ultrasound of both breasts.
(Code:[id number]-[id number])

The patient will be contacted regarding the findings, and additional
imaging will be scheduled.

BI-RADS CATEGORY  0: Incomplete. Need additional imaging evaluation
and/or prior mammograms for comparison.

## 2021-01-25 IMAGING — MG DIGITAL DIAGNOSTIC BILAT W/ TOMO W/ CAD
8 series · 8 of 24 positions shown · non-contrast
Comparison: Baseline screening mammogram September 29, 2019

ACR Breast Density Category a: The breast tissue is almost entirely
fatty.

CLINICAL DATA: 43-year-old patient recalled from recent baseline
screening mammogram for evaluation bilateral breast masses. The
patient shows me a chronic sebaceous cyst on the lower inner
quadrant of her left breast today.

EXAM:
DIGITAL DIAGNOSTIC BILATERAL MAMMOGRAM WITH CAD AND TOMO
ULTRASOUND BILATERAL BREAST

[R CC synth-2D]
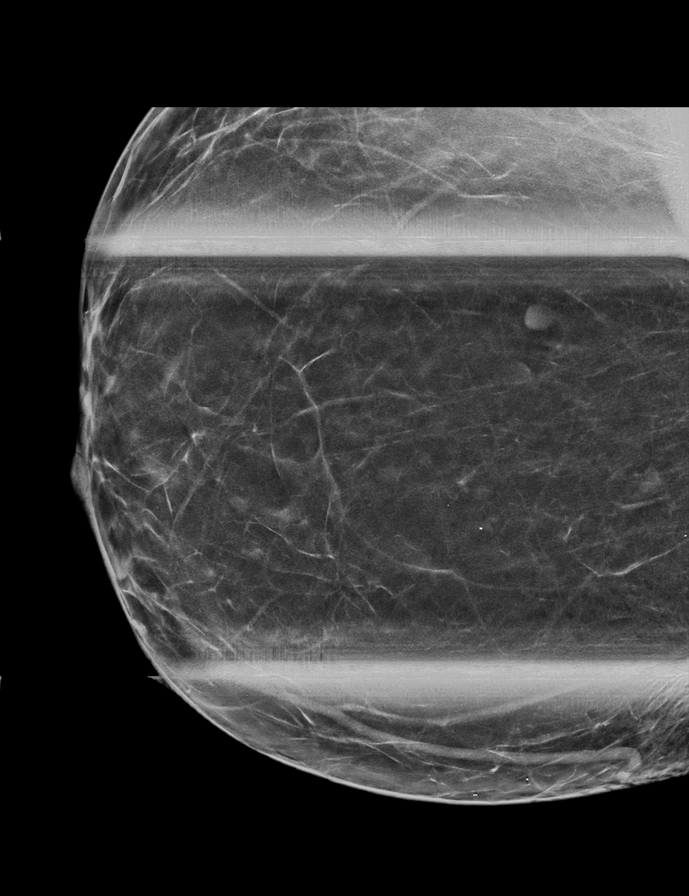

[L CC synth-2D]
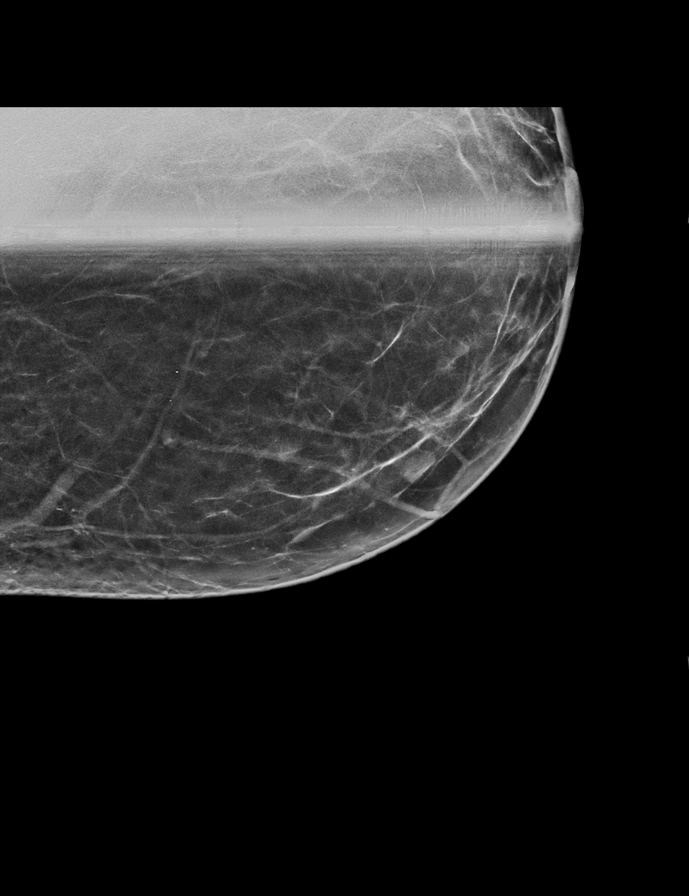

[R MLO synth-2D]
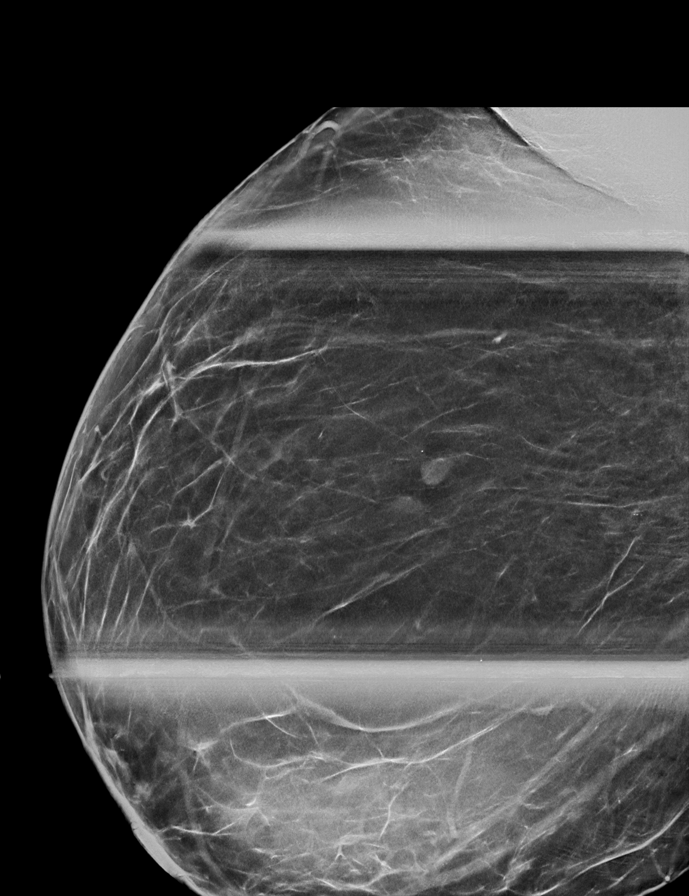

[L MLO synth-2D]
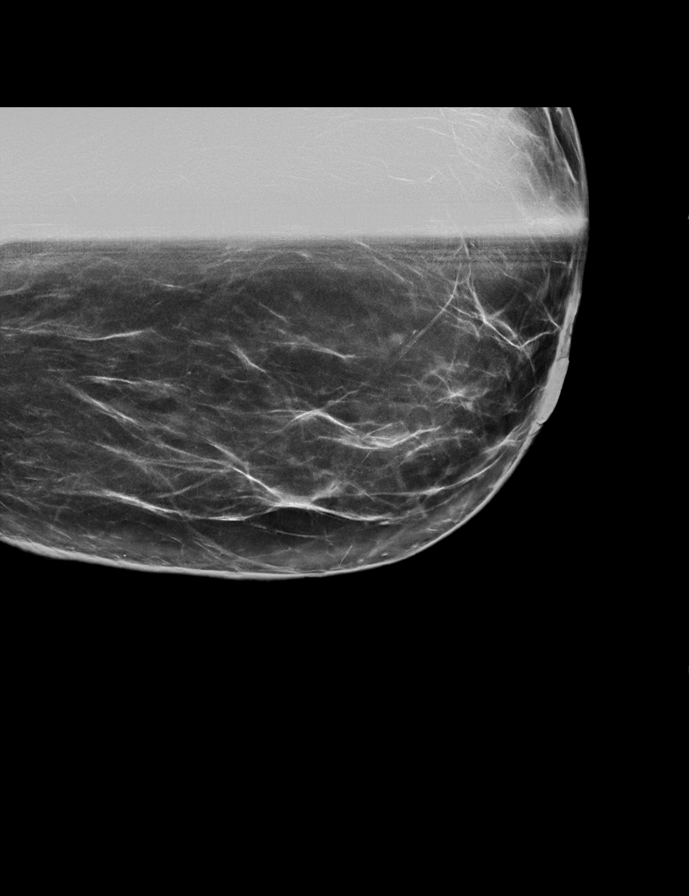

[R CC tomo · tomo slice 31/60.0]
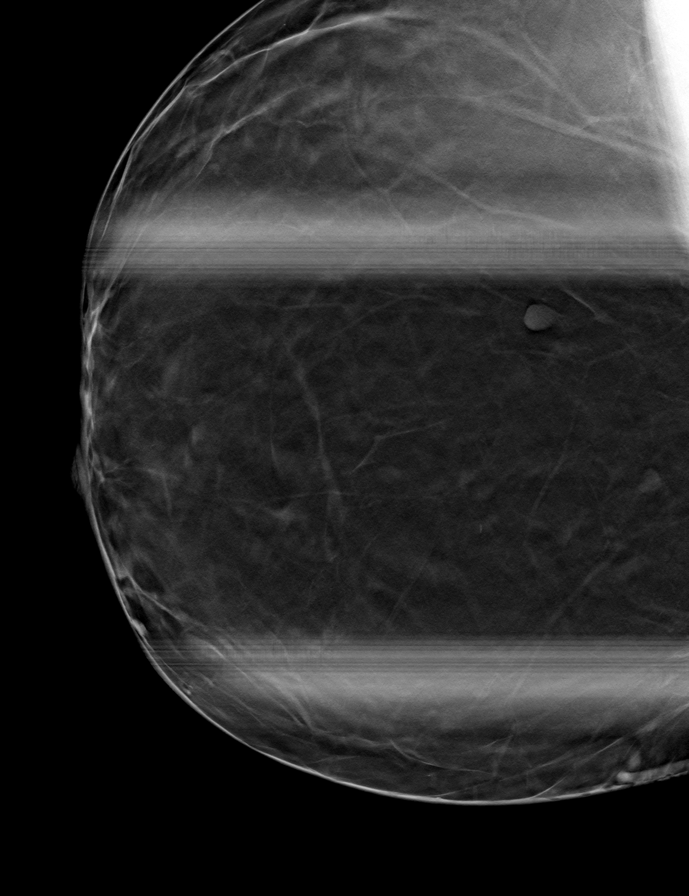

[L MLO tomo · tomo slice 36/71.0]
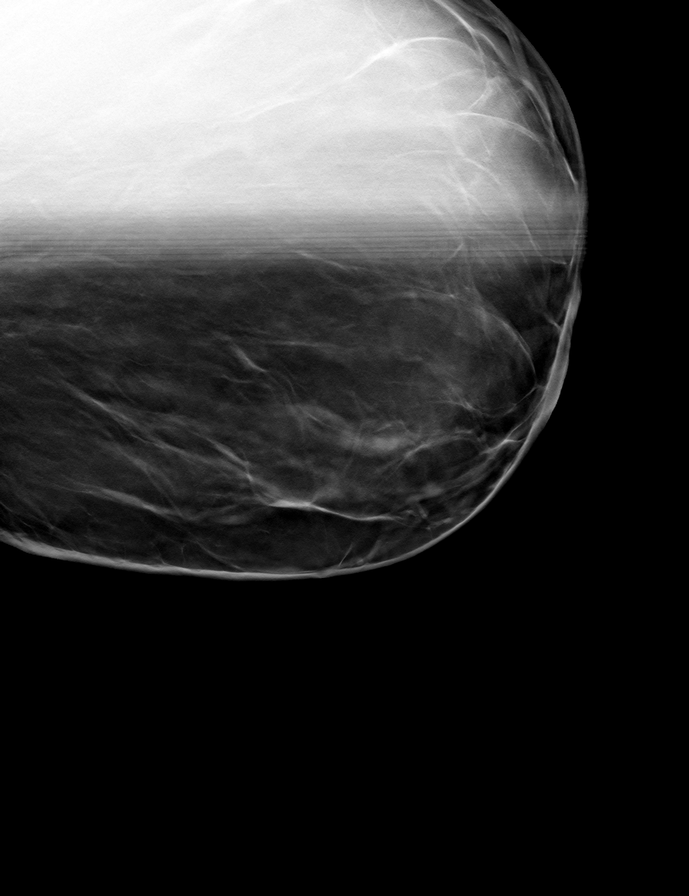

[L CC tomo · tomo slice 28/55.0]
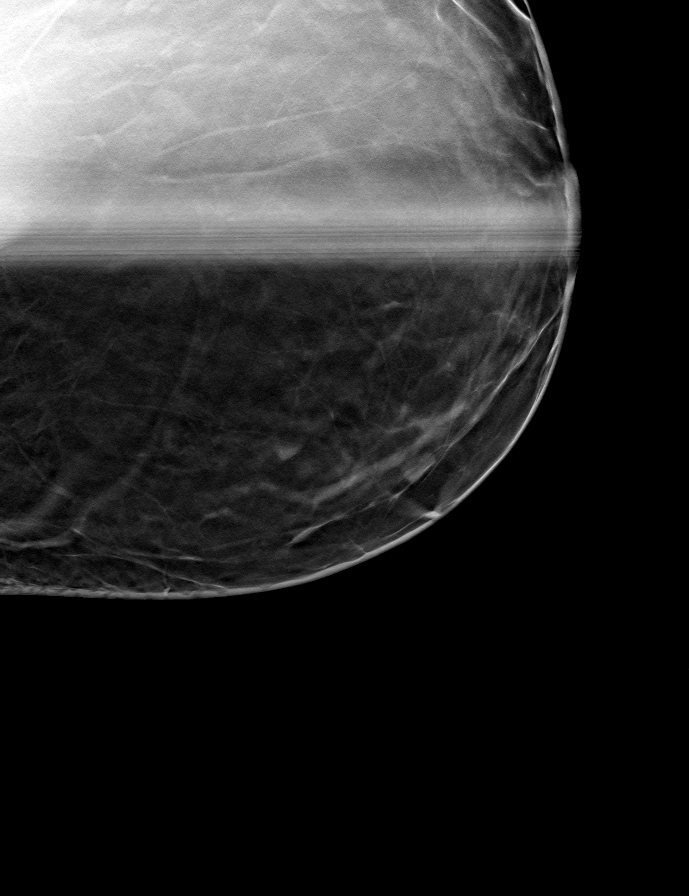

[R MLO tomo · tomo slice 35/70.0]
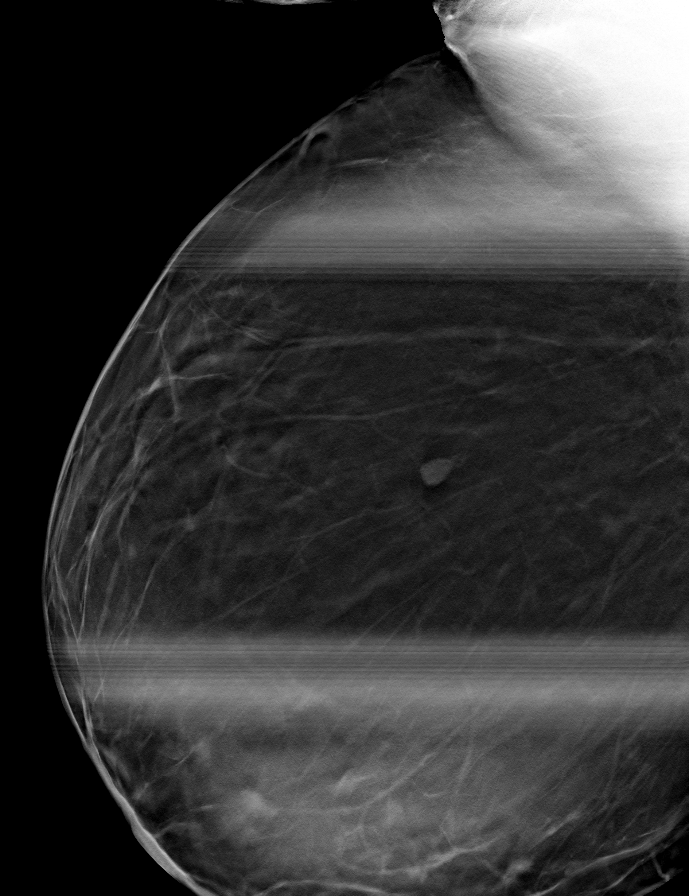

[8 of 24 positions shown; findings below may reference images not displayed]

FINDINGS: Additional views of the left breast show a superficially positioned
oval circumscribed approximately 0.8 cm mass in the lower inner
quadrant.

Additional mammographic views of the right breast show a discrete
circumscribed oval approximately 0.7 cm mass in the upper slightly
inner right breast. Slightly lateral to this 0.7 cm mass is a second
lower density, possibly fat containing mass in the upper central
right breast that is circumscribed, oval, and measures approximately
7 mm.

Mammographic images were processed with CAD.

On physical exam, there is a visible sebaceous cyst in the lower
inner quadrant of the left breast, 8 o'clock region.

Targeted ultrasound is performed, showing a circumscribed oval
hypoechoic mass within the dermis of the left breast 8 o'clock
position 8 cm from the nipple measuring 1.2 x 0.3 x 0.9 cm. This
correlates with the mammographically detected mass and is consistent
with a benign sebaceous cyst.

Ultrasound of the right breast shows a circumscribed hypoechoic
parallel oval mass at [DATE] position measuring 0.7 x 0.4 x 0.6 cm.
This has an appearance suggestive of a benign fibroadenoma and shows
no internal vascular flow.

At [DATE] position 9 cm from the nipple is a 0.5 x 0.2 x 0.4 cm
circumscribed oval mass, possibly a complicated cyst or fibroadenoma
and may correlate with the low-density mass seen on the mammogram.
No additional masses are identified in the superior right breast.
IMPRESSION: 1. Benign sebaceous cyst 8 o'clock position left breast.
2. Probably benign mass [DATE] position right breast, favored to be a
fibroadenoma. Well visualized on mammography and ultrasound.
3. Probably benign low-density mass on mammography in the upper
central right breast, with possible ultrasound correlate at [DATE]
position 9 cm from the nipple on ultrasound.

RECOMMENDATION:
Diagnostic right mammogram and possible right breast ultrasound in 6
months is recommended to evaluate the 2 probably benign masses in
the right breast.

I have discussed the findings and recommendations with the patient.
If applicable, a reminder letter will be sent to the patient
regarding the next appointment.

BI-RADS CATEGORY  3: Probably benign.

## 2021-01-25 IMAGING — US US BREAST*R* LIMITED INC AXILLA
1 series · 12 of 12 positions shown · non-contrast
Comparison: Baseline screening mammogram September 29, 2019

ACR Breast Density Category a: The breast tissue is almost entirely
fatty.

CLINICAL DATA: 43-year-old patient recalled from recent baseline
screening mammogram for evaluation bilateral breast masses. The
patient shows me a chronic sebaceous cyst on the lower inner
quadrant of her left breast today.

EXAM:
DIGITAL DIAGNOSTIC BILATERAL MAMMOGRAM WITH CAD AND TOMO
ULTRASOUND BILATERAL BREAST

[Series 1: us breast*right* limited inc axilla · 0.07mm/px · 12 of 12 slices shown]
[im 1/12]
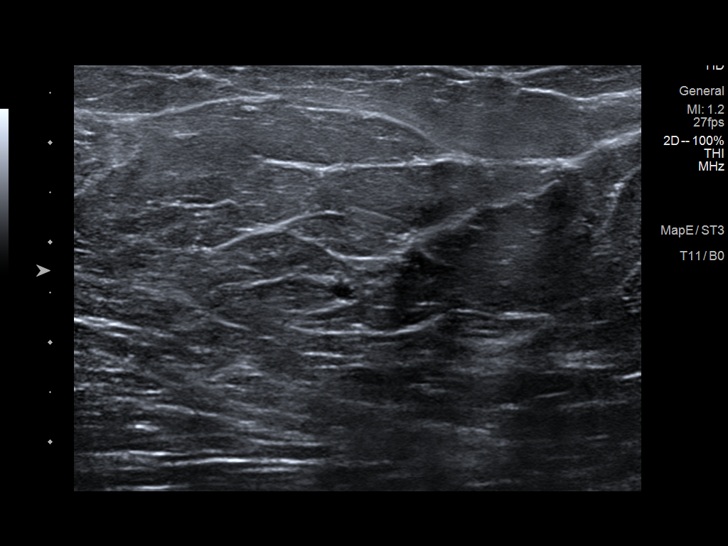
[im 2/12]
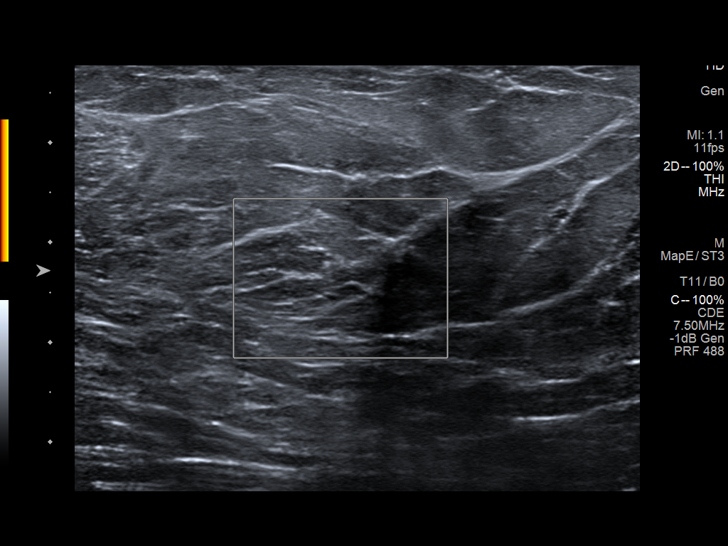
[im 3/12]
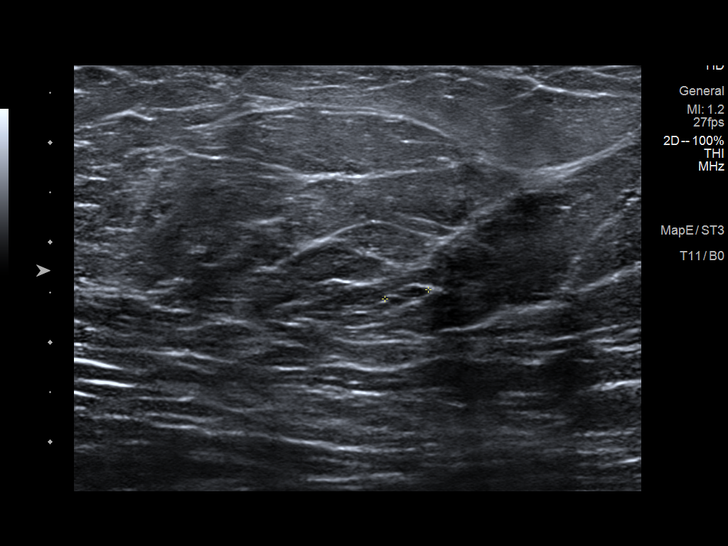
[im 4/12]
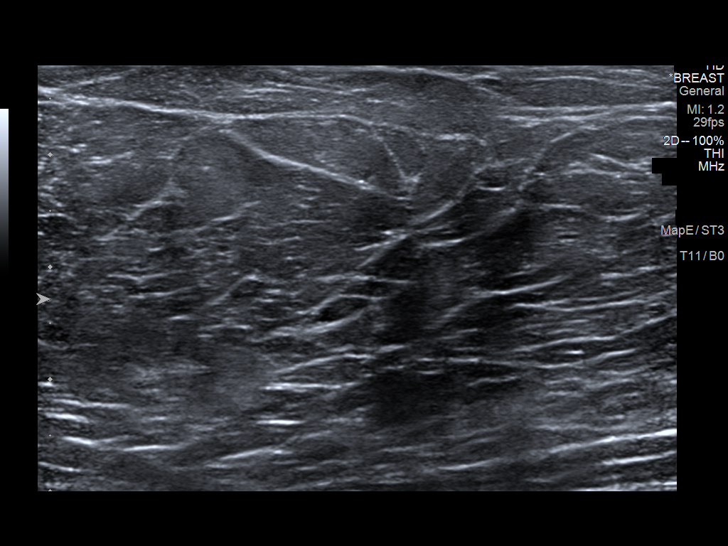
[im 5/12]
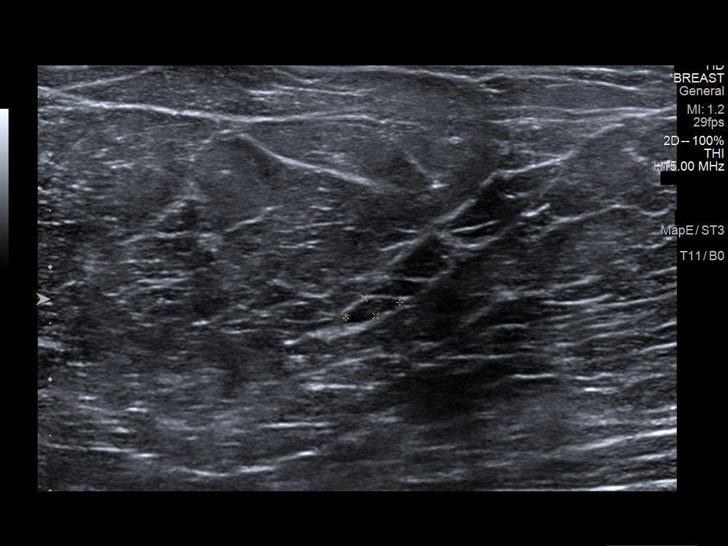
[im 6/12]
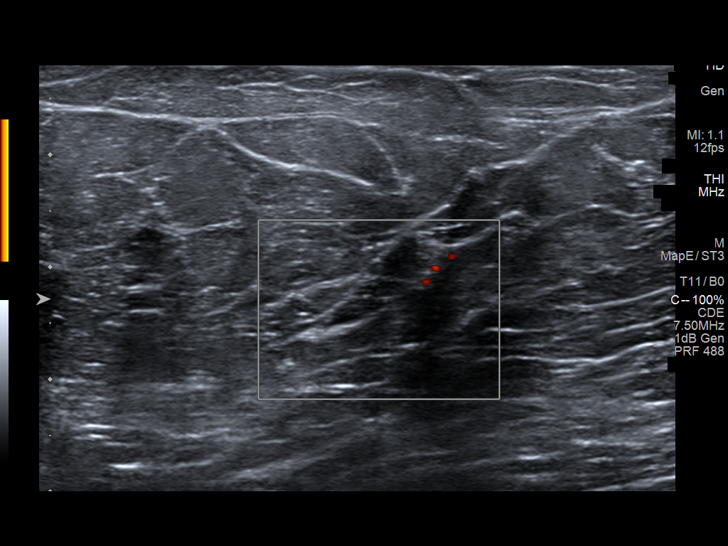
[im 7/12]
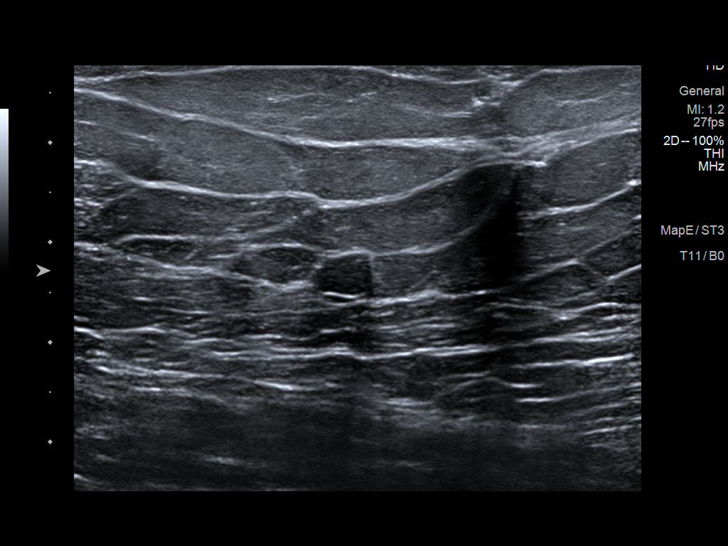
[im 8/12]
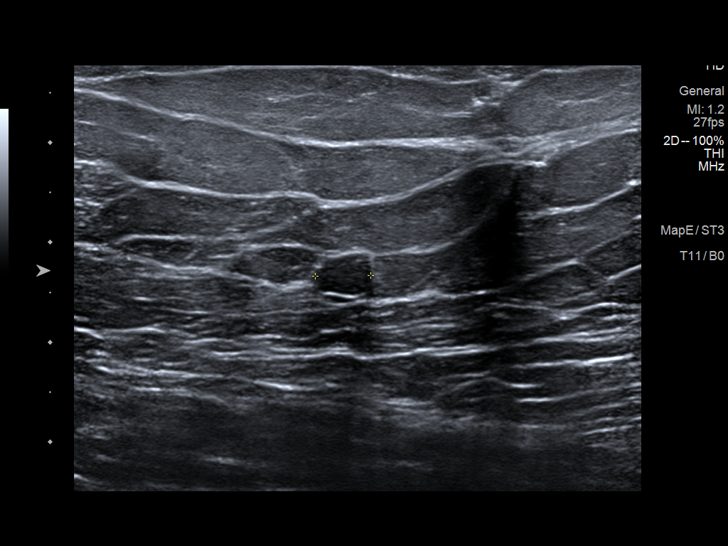
[im 9/12]
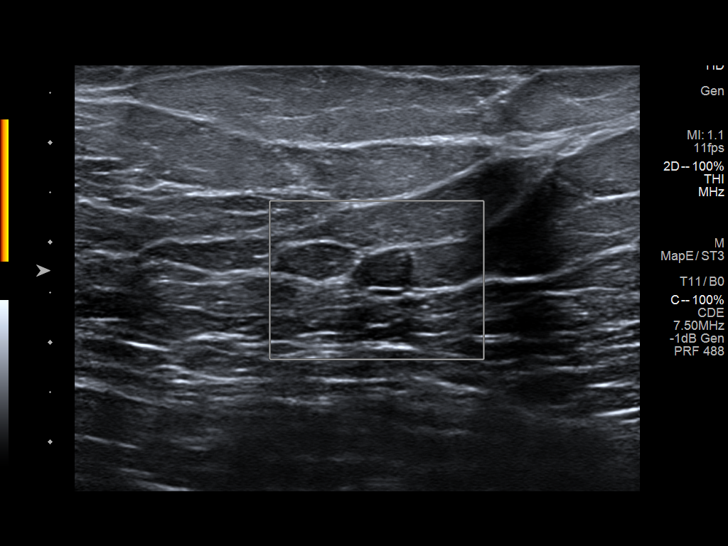
[im 10/12]
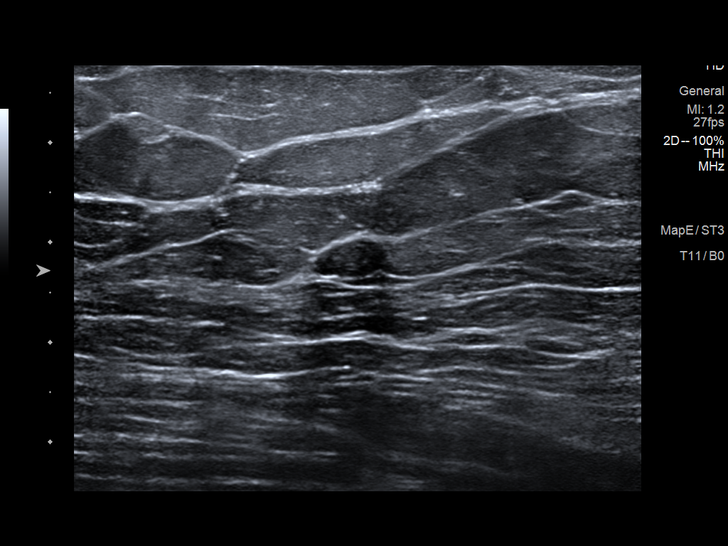
[im 11/12]
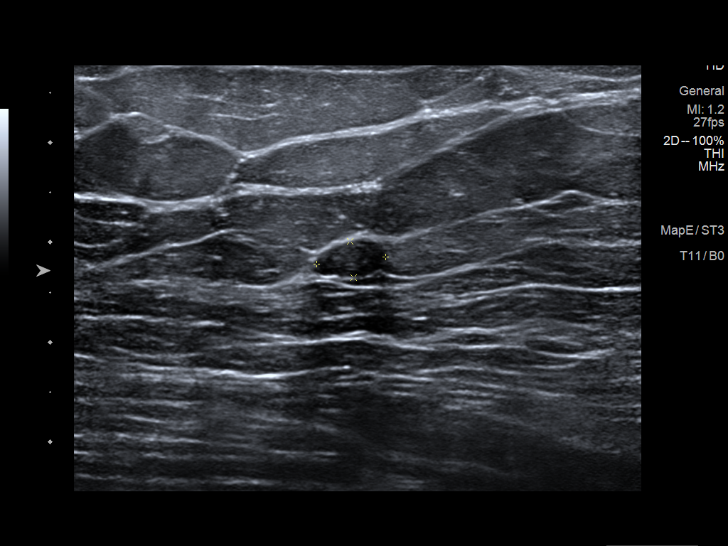
[im 12/12]
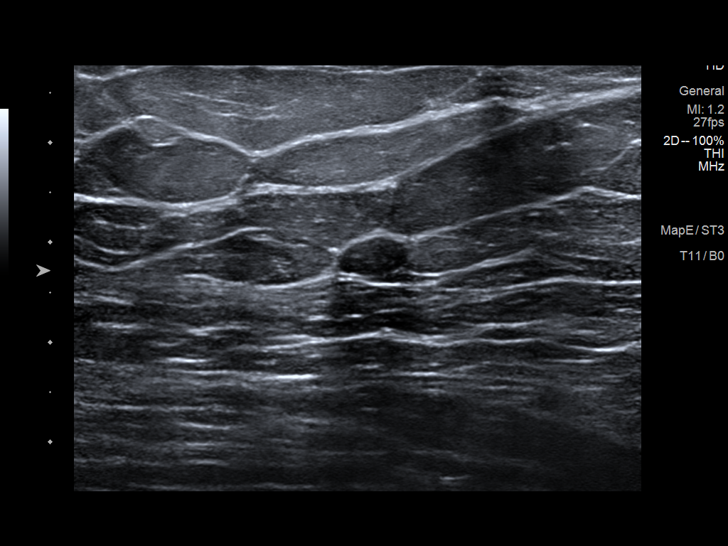

[12 of 12 positions shown; findings below may reference images not displayed]

FINDINGS: Additional views of the left breast show a superficially positioned
oval circumscribed approximately 0.8 cm mass in the lower inner
quadrant.

Additional mammographic views of the right breast show a discrete
circumscribed oval approximately 0.7 cm mass in the upper slightly
inner right breast. Slightly lateral to this 0.7 cm mass is a second
lower density, possibly fat containing mass in the upper central
right breast that is circumscribed, oval, and measures approximately
7 mm.

Mammographic images were processed with CAD.

On physical exam, there is a visible sebaceous cyst in the lower
inner quadrant of the left breast, 8 o'clock region.

Targeted ultrasound is performed, showing a circumscribed oval
hypoechoic mass within the dermis of the left breast 8 o'clock
position 8 cm from the nipple measuring 1.2 x 0.3 x 0.9 cm. This
correlates with the mammographically detected mass and is consistent
with a benign sebaceous cyst.

Ultrasound of the right breast shows a circumscribed hypoechoic
parallel oval mass at [DATE] position measuring 0.7 x 0.4 x 0.6 cm.
This has an appearance suggestive of a benign fibroadenoma and shows
no internal vascular flow.

At [DATE] position 9 cm from the nipple is a 0.5 x 0.2 x 0.4 cm
circumscribed oval mass, possibly a complicated cyst or fibroadenoma
and may correlate with the low-density mass seen on the mammogram.
No additional masses are identified in the superior right breast.
IMPRESSION: 1. Benign sebaceous cyst 8 o'clock position left breast.
2. Probably benign mass [DATE] position right breast, favored to be a
fibroadenoma. Well visualized on mammography and ultrasound.
3. Probably benign low-density mass on mammography in the upper
central right breast, with possible ultrasound correlate at [DATE]
position 9 cm from the nipple on ultrasound.

RECOMMENDATION:
Diagnostic right mammogram and possible right breast ultrasound in 6
months is recommended to evaluate the 2 probably benign masses in
the right breast.

I have discussed the findings and recommendations with the patient.
If applicable, a reminder letter will be sent to the patient
regarding the next appointment.

BI-RADS CATEGORY  3: Probably benign.

## 2021-01-25 IMAGING — US US BREAST*L* LIMITED INC AXILLA
1 series · 3 of 3 positions shown · non-contrast
Comparison: Baseline screening mammogram September 29, 2019

ACR Breast Density Category a: The breast tissue is almost entirely
fatty.

CLINICAL DATA: 43-year-old patient recalled from recent baseline
screening mammogram for evaluation bilateral breast masses. The
patient shows me a chronic sebaceous cyst on the lower inner
quadrant of her left breast today.

EXAM:
DIGITAL DIAGNOSTIC BILATERAL MAMMOGRAM WITH CAD AND TOMO
ULTRASOUND BILATERAL BREAST

[Series 1: us breast*left* limited inc axilla · 0.08mm/px · 3 of 3 slices shown]
[im 1/3]
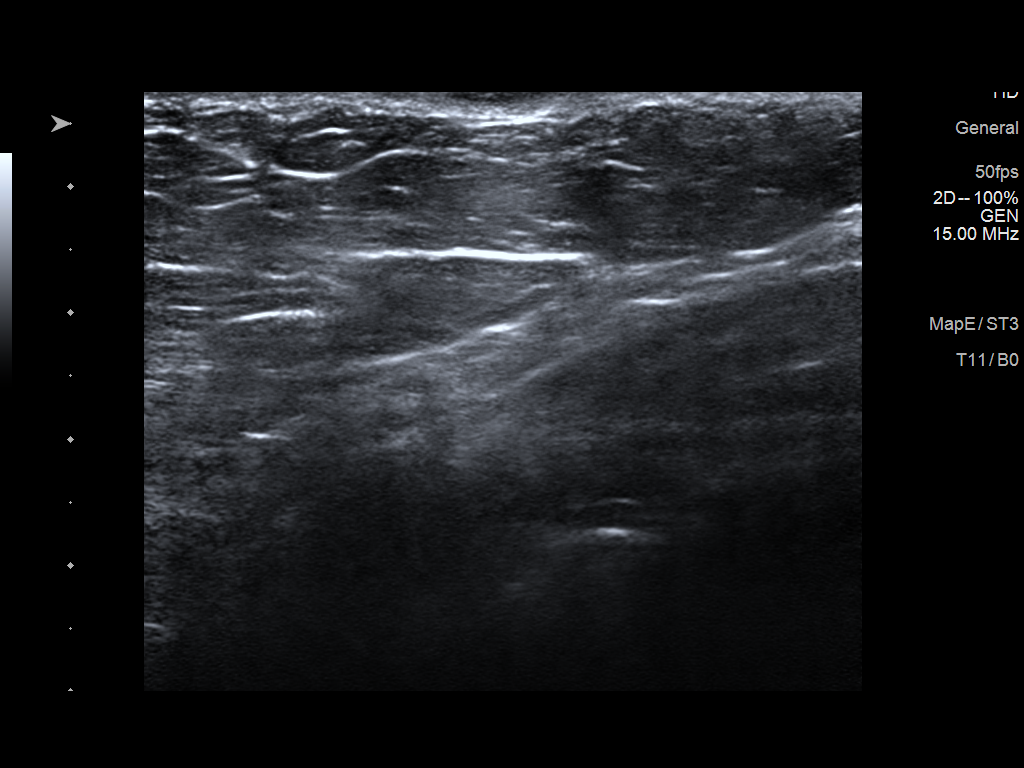
[im 2/3]
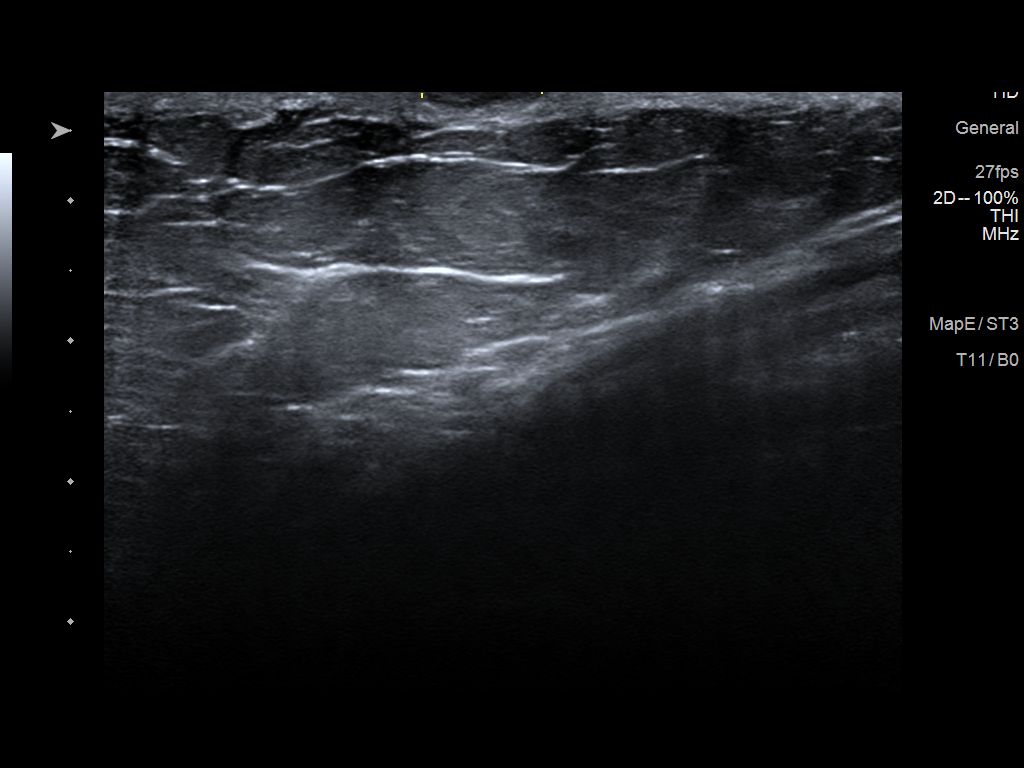
[im 3/3]
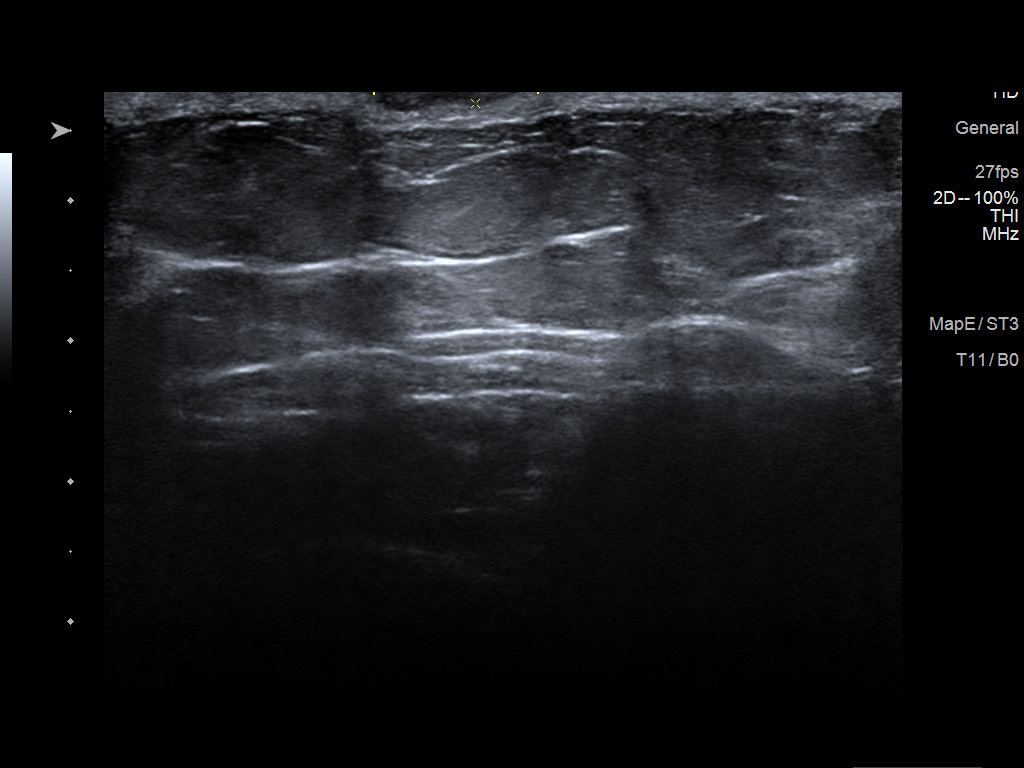

[3 of 3 positions shown; findings below may reference images not displayed]

FINDINGS: Additional views of the left breast show a superficially positioned
oval circumscribed approximately 0.8 cm mass in the lower inner
quadrant.

Additional mammographic views of the right breast show a discrete
circumscribed oval approximately 0.7 cm mass in the upper slightly
inner right breast. Slightly lateral to this 0.7 cm mass is a second
lower density, possibly fat containing mass in the upper central
right breast that is circumscribed, oval, and measures approximately
7 mm.

Mammographic images were processed with CAD.

On physical exam, there is a visible sebaceous cyst in the lower
inner quadrant of the left breast, 8 o'clock region.

Targeted ultrasound is performed, showing a circumscribed oval
hypoechoic mass within the dermis of the left breast 8 o'clock
position 8 cm from the nipple measuring 1.2 x 0.3 x 0.9 cm. This
correlates with the mammographically detected mass and is consistent
with a benign sebaceous cyst.

Ultrasound of the right breast shows a circumscribed hypoechoic
parallel oval mass at [DATE] position measuring 0.7 x 0.4 x 0.6 cm.
This has an appearance suggestive of a benign fibroadenoma and shows
no internal vascular flow.

At [DATE] position 9 cm from the nipple is a 0.5 x 0.2 x 0.4 cm
circumscribed oval mass, possibly a complicated cyst or fibroadenoma
and may correlate with the low-density mass seen on the mammogram.
No additional masses are identified in the superior right breast.
IMPRESSION: 1. Benign sebaceous cyst 8 o'clock position left breast.
2. Probably benign mass [DATE] position right breast, favored to be a
fibroadenoma. Well visualized on mammography and ultrasound.
3. Probably benign low-density mass on mammography in the upper
central right breast, with possible ultrasound correlate at [DATE]
position 9 cm from the nipple on ultrasound.

RECOMMENDATION:
Diagnostic right mammogram and possible right breast ultrasound in 6
months is recommended to evaluate the 2 probably benign masses in
the right breast.

I have discussed the findings and recommendations with the patient.
If applicable, a reminder letter will be sent to the patient
regarding the next appointment.

BI-RADS CATEGORY  3: Probably benign.

## 2021-02-05 IMAGING — DX DG CHEST 2V
2 series · 2 of 2 positions shown · non-contrast
Comparison: 12/03/2017

CLINICAL DATA: Upper airway cough syndrome.  Asthma.

EXAM:
CHEST - 2 VIEW

[chest pa]
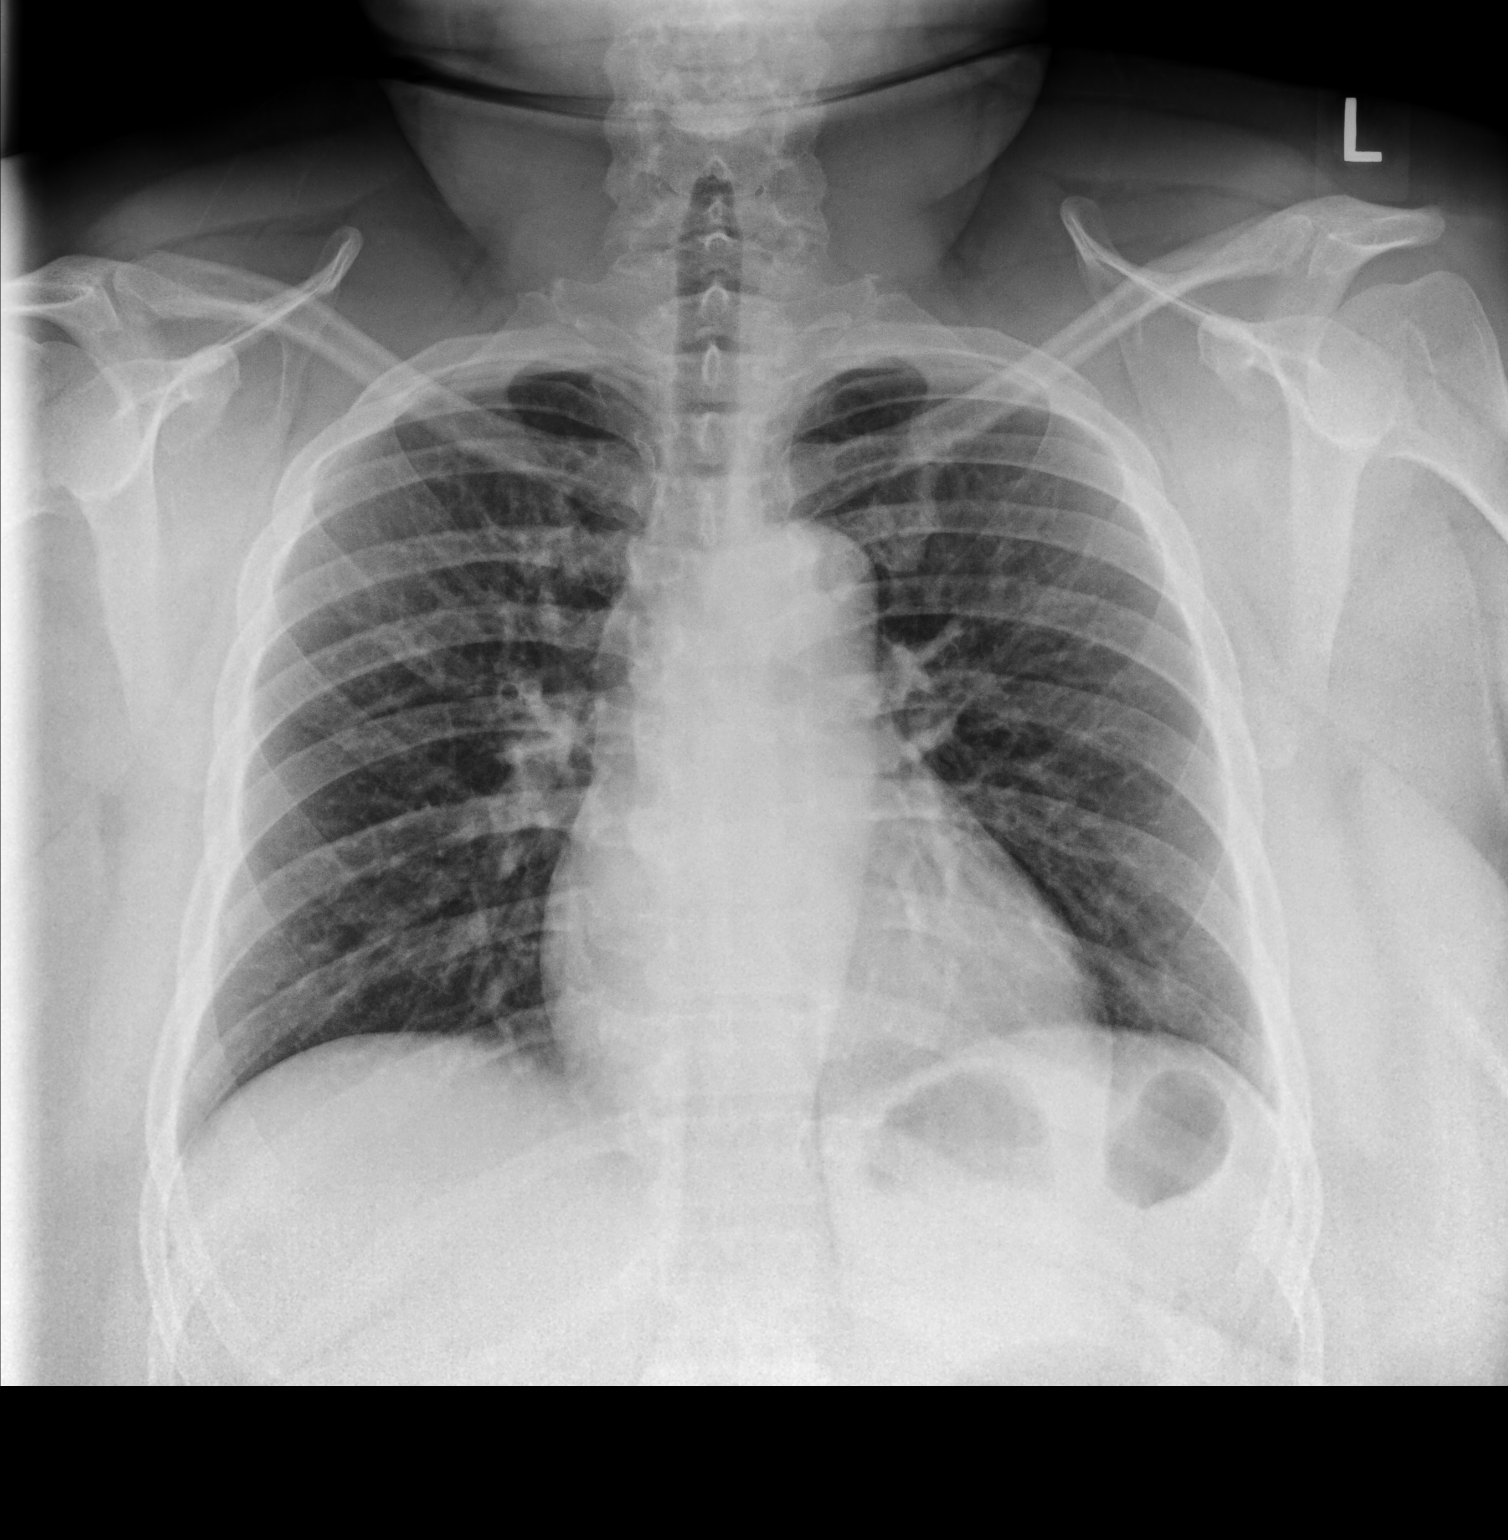

[chest lat]
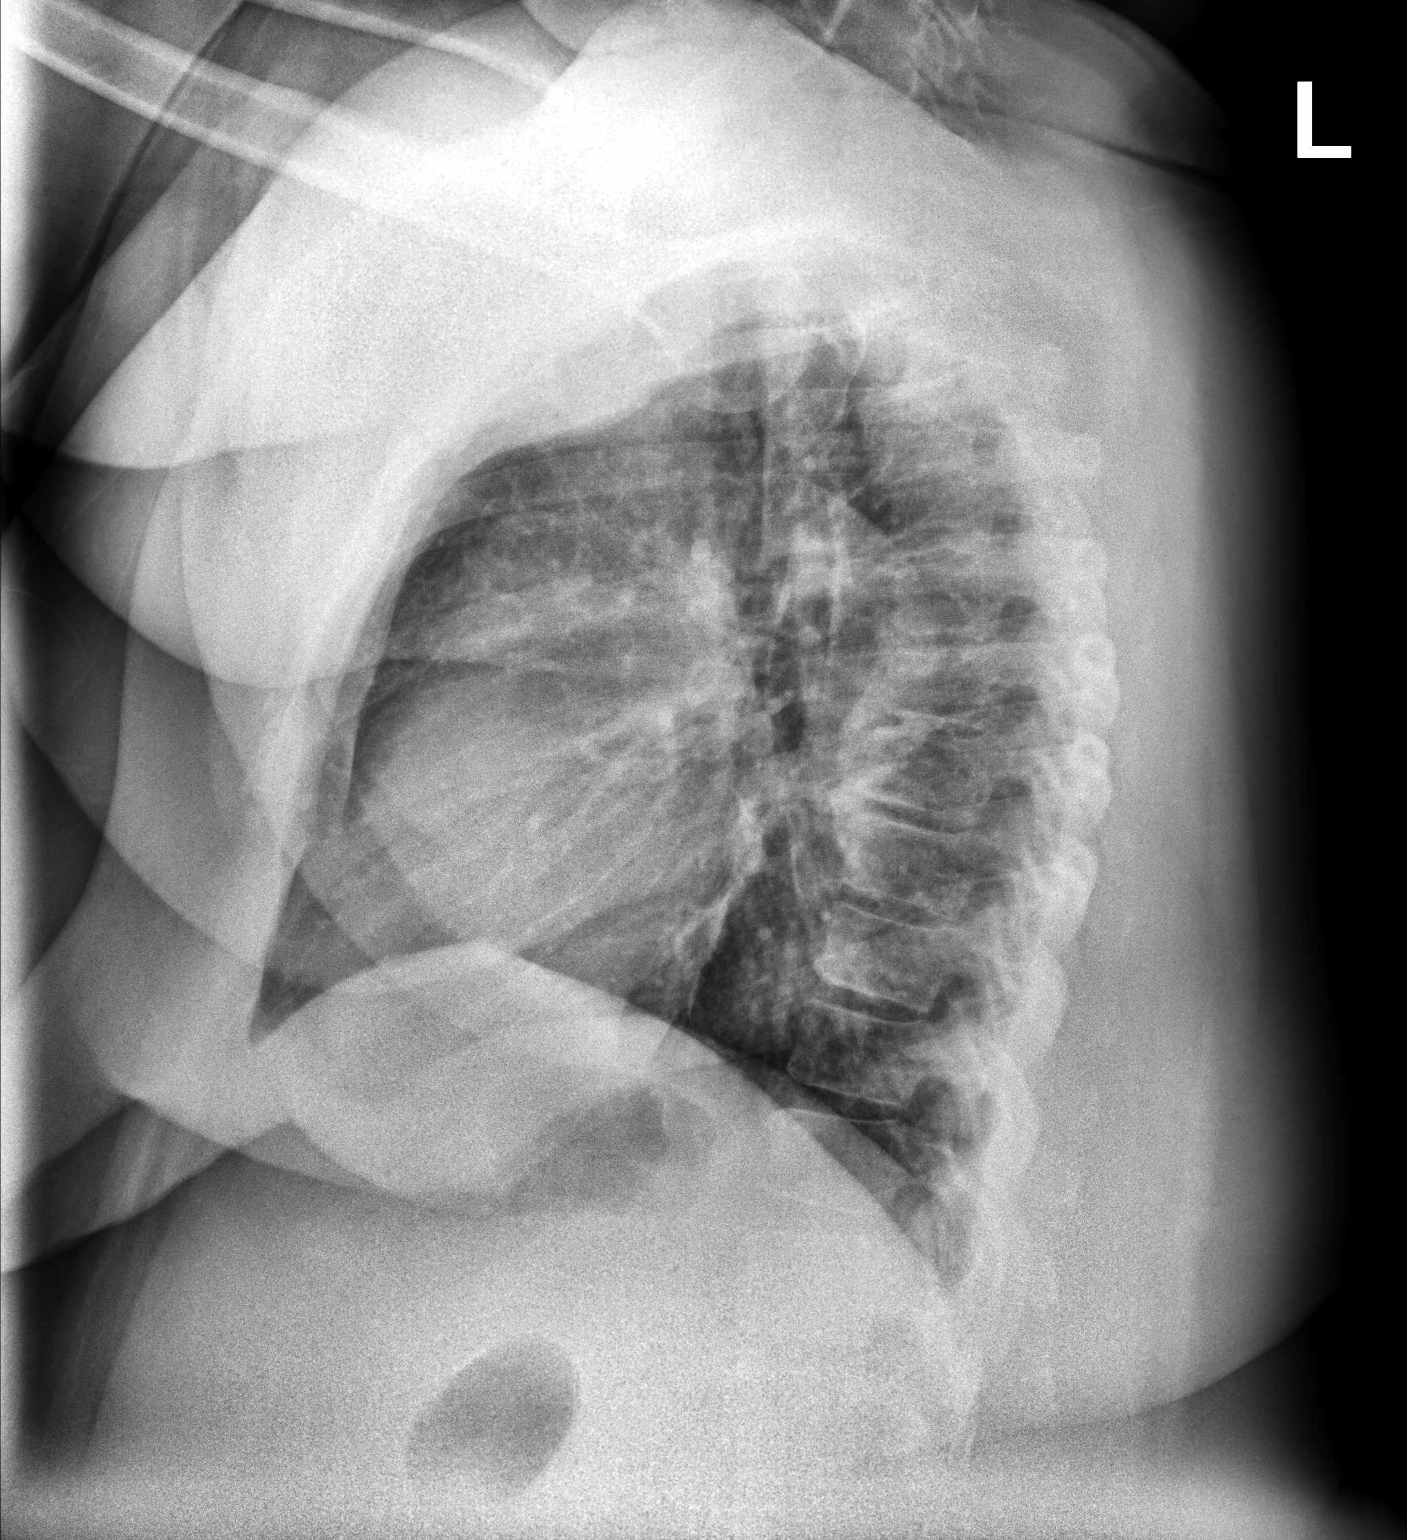

[2 of 2 positions shown; findings below may reference images not displayed]

FINDINGS: The heart size and mediastinal contours are within normal limits.
Both lungs are clear. The visualized skeletal structures are
unremarkable.
IMPRESSION: No active cardiopulmonary disease.

## 2021-03-21 ENCOUNTER — Other Ambulatory Visit: Payer: Self-pay

## 2021-03-21 ENCOUNTER — Emergency Department (HOSPITAL_BASED_OUTPATIENT_CLINIC_OR_DEPARTMENT_OTHER)
Admission: EM | Admit: 2021-03-21 | Discharge: 2021-03-22 | Disposition: A | Discharge status: Home / Self Care | Payer: 59 | Attending: Emergency Medicine | Admitting: Emergency Medicine

## 2021-03-21 ENCOUNTER — Encounter (HOSPITAL_BASED_OUTPATIENT_CLINIC_OR_DEPARTMENT_OTHER): Payer: Self-pay | Admitting: *Deleted

## 2021-03-21 DIAGNOSIS — R7303 Prediabetes: Secondary | ICD-10-CM | POA: Insufficient documentation

## 2021-03-21 DIAGNOSIS — Z79899 Other long term (current) drug therapy: Secondary | ICD-10-CM | POA: Diagnosis not present

## 2021-03-21 DIAGNOSIS — Z7984 Long term (current) use of oral hypoglycemic drugs: Secondary | ICD-10-CM | POA: Insufficient documentation

## 2021-03-21 DIAGNOSIS — J45901 Unspecified asthma with (acute) exacerbation: Secondary | ICD-10-CM | POA: Insufficient documentation

## 2021-03-21 DIAGNOSIS — I10 Essential (primary) hypertension: Secondary | ICD-10-CM | POA: Insufficient documentation

## 2021-03-21 DIAGNOSIS — E876 Hypokalemia: Secondary | ICD-10-CM

## 2021-03-21 DIAGNOSIS — R1310 Dysphagia, unspecified: Secondary | ICD-10-CM | POA: Insufficient documentation

## 2021-03-21 DIAGNOSIS — R1110 Vomiting, unspecified: Secondary | ICD-10-CM | POA: Diagnosis present

## 2021-03-21 LAB — COMPREHENSIVE METABOLIC PANEL
ALT: 20 U/L (ref 0–44)
AST: 24 U/L (ref 15–41)
Albumin: 4 g/dL (ref 3.5–5.0)
Alkaline Phosphatase: 50 U/L (ref 38–126)
Anion gap: 17 — ABNORMAL HIGH (ref 5–15)
BUN: 9 mg/dL (ref 6–20)
CO2: 26 mmol/L (ref 22–32)
Calcium: 9.6 mg/dL (ref 8.9–10.3)
Chloride: 93 mmol/L — ABNORMAL LOW (ref 98–111)
Creatinine, Ser: 1 mg/dL (ref 0.44–1.00)
GFR, Estimated: >60 mL/min (ref >60)
Glucose, Bld: 83 mg/dL (ref 70–99)
Potassium: 2.5 mmol/L — CL (ref 3.5–5.1)
Sodium: 136 mmol/L (ref 135–145)
Total Bilirubin: 1.5 mg/dL — ABNORMAL HIGH (ref 0.3–1.2)
Total Protein: 8 g/dL (ref 6.5–8.1)

## 2021-03-21 LAB — URINALYSIS, ROUTINE W REFLEX MICROSCOPIC
Glucose, UA: NEGATIVE mg/dL
Ketones, ur: >80 mg/dL — AB
Leukocytes,Ua: NEGATIVE
Nitrite: NEGATIVE
Protein, ur: 30 mg/dL — AB
Specific Gravity, Urine: >1.03 — ABNORMAL HIGH (ref 1.005–1.030)
pH: 6 (ref 5.0–8.0)

## 2021-03-21 LAB — CBC WITH DIFFERENTIAL/PLATELET
Abs Immature Granulocytes: 0.02 10*3/uL (ref 0.00–0.07)
Basophils Absolute: 0 10*3/uL (ref 0.0–0.1)
Basophils Relative: 1 %
Eosinophils Absolute: 0.1 10*3/uL (ref 0.0–0.5)
Eosinophils Relative: 2 %
HCT: 38.5 % (ref 36.0–46.0)
Hemoglobin: 12.7 g/dL (ref 12.0–15.0)
Immature Granulocytes: 0 %
Lymphocytes Relative: 34 %
Lymphs Abs: 2.2 10*3/uL (ref 0.7–4.0)
MCH: 28.9 pg (ref 26.0–34.0)
MCHC: 33 g/dL (ref 30.0–36.0)
MCV: 87.7 fL (ref 80.0–100.0)
Monocytes Absolute: 0.4 10*3/uL (ref 0.1–1.0)
Monocytes Relative: 7 %
Neutro Abs: 3.7 10*3/uL (ref 1.7–7.7)
Neutrophils Relative %: 56 %
Platelets: 267 10*3/uL (ref 150–400)
RBC: 4.39 MIL/uL (ref 3.87–5.11)
RDW: 14.6 % (ref 11.5–15.5)
WBC: 6.5 10*3/uL (ref 4.0–10.5)
nRBC: 0 % (ref 0.0–0.2)

## 2021-03-21 LAB — URINALYSIS, MICROSCOPIC (REFLEX)

## 2021-03-21 LAB — LIPASE, BLOOD: Lipase: 24 U/L (ref 11–51)

## 2021-03-21 LAB — PREGNANCY, URINE: Preg Test, Ur: NEGATIVE

## 2021-03-21 LAB — MAGNESIUM: Magnesium: 1.9 mg/dL (ref 1.7–2.4)

## 2021-03-21 MED ORDER — PANTOPRAZOLE SODIUM 40 MG IV SOLR
40.0000 mg | Freq: Once | INTRAVENOUS | Status: AC
  Administered 2021-03-21: 40 mg via INTRAVENOUS
  Filled 2021-03-21: qty 40

## 2021-03-21 MED ORDER — ONDANSETRON HCL 4 MG/2ML IJ SOLN
4.0000 mg | Freq: Once | INTRAMUSCULAR | Status: AC
  Administered 2021-03-21: 4 mg via INTRAVENOUS
  Filled 2021-03-21: qty 2

## 2021-03-21 MED ORDER — POTASSIUM CHLORIDE 20 MEQ PO PACK
60.0000 meq | PACK | Freq: Once | ORAL | Status: AC
  Administered 2021-03-22: 60 meq via ORAL
  Filled 2021-03-21: qty 3

## 2021-03-21 MED ORDER — SODIUM CHLORIDE 0.9 % IV BOLUS
1000.0000 mL | Freq: Once | INTRAVENOUS | Status: AC
  Administered 2021-03-21: 1000 mL via INTRAVENOUS

## 2021-03-21 NOTE — ED Provider Notes (Addendum)
[facility] EMERGENCY DEPARTMENT Provider Note   CSN: [id number] Arrival date & time: 03/21/21  1859     History Chief Complaint  Patient presents with  . Abdominal Pain  . Vomiting    Kim Barnes is a 45 y.o. female with past medical history of HTN, PCOS, and s/p gastric bypass performed 11/2020 followed by [facility] general surgery team who presents the ED with inability to tolerate food and liquid by mouth.   I reviewed patient's medical record and she had a telemedicine encounter with an NP at her bariatric facility on 03/17/2021 during which she expressed and inability to tolerate solids and difficulty with pureed food and even liquids.  They planned for upper GI study to evaluate for stricture and recommended Zofran ODT and continued oral medications.    However, patient reports that she has not yet heard from their team and is having increased difficulty eating and drinking, even liquids.  She endorses 4 lb weight loss in past week due to diminished p.o. intake.  She endorses "hunger pains" but denies any significant abdominal pain.  Emesis has been nonbloody.  She denies any fevers or chills, headache or dizziness, chest pain or difficulty breathing, palpitations, urinary symptoms, vaginal pain or discharge, or other symptoms.  She is accompanied by her friend who is at bedside.  She states that she also has a primary care provider, but she has not called them regarding these symptoms.  HPI     Past Medical History:  Diagnosis Date  . Anxiety   . Asthma    prn inhaler  . Back pain   . Bell's palsy   . Carpal tunnel syndrome of left wrist 01/2013  . Chest pain   . Cold hands and feet   . Cough   . Depression   . Dry mouth   . Dry skin   . Fatigue   . GERD (gastroesophageal reflux disease)    occasional; no current med.  [name] History of brain tumor 1996   no neurologic deficits  . HTN (hypertension)    under control with med., has been on  med. x 5 yr.  . Joint pain   . Obesity   . Obstructive sleep apnea   . PCOS (polycystic ovarian syndrome)   . Polycystic ovarian syndrome    no current med.; irregular periods  . Shortness of breath   . Stress   . Swelling of extremity   . Wheezing     Patient Active Problem List   Diagnosis Date Noted  . Anxiety 01/05/2020  . Morbid obesity due to excess calories (HCC) 10/13/2019  . Other hyperlipidemia 03/04/2019  . Class 3 severe obesity with serious comorbidity and body mass index (BMI) of 60.0 to 69.9 in adult (HCC) 11/03/2018  . Thoracic back pain 04/28/2018  . Low back pain 04/28/2018  . Vitamin D deficiency 04/23/2018  . Prediabetes 04/23/2018  . Other fatigue 02/26/2018  . Shortness of breath on exertion 02/26/2018  . Obstructive sleep apnea syndrome 02/26/2018  . LLQ abdominal pain 11/07/2017  . Bell palsy 02/05/2017  . OSA on CPAP 10/20/2015  . PCOS (polycystic ovarian syndrome) 03/31/2015  . Snoring 03/31/2015  . Upper airway cough syndrome 05/25/2014  . Well adult exam 10/01/2013  . Asthma exacerbation 08/27/2011  . GERD 09/20/2010  . Cough 05/24/2010  . DEPRESSION 12/06/2009  . Edema 05/12/2009  . DYSPNEA 05/12/2009  . Chest pain, unspecified 05/12/2009  . Asthma 07/07/2008  .  AMENORRHEA 07/07/2008  . Essential hypertension 09/08/2007  . Hyperglycemia 09/08/2007    Past Surgical History:  Procedure Laterality Date  . BRAIN SURGERY  1996   exc. tumor  . CARPAL TUNNEL RELEASE Left 02/10/2013   Procedure: CARPAL TUNNEL RELEASE;  Surgeon: Nicki Reaper, MD;  Location: [facility];  Service: Orthopedics;  Laterality: Left;  ANESTHESIA: IV REGIONAL FAB  . HYSTEROSCOPY WITH D & C  02/21/2006  . HYSTEROSCOPY WITH D & C  12/17/2011   Procedure: DILATATION AND CURETTAGE /HYSTEROSCOPY;  Surgeon: Meriel Pica, MD;  Location: [facility];  Service: Gynecology;  Laterality: N/A;  . LEEP  01/07/2004  . TONSILLECTOMY  09/21/2002     OB History     Gravida  2   Para      Term      Preterm      AB  2   Living  0     SAB  1   IAB  1   Ectopic      Multiple      Live Births  0           Family History  Problem Relation Age of Onset  . Hypertension Other   . Lung cancer Father        Smoker  . Cancer Father        lung/ smoker  . Heart disease Father   . Alcoholism Father   . Breast cancer Maternal Grandmother   . Cancer Maternal Grandmother        breast  . Emphysema Paternal Grandmother   . Asthma Mother   . Hyperlipidemia Mother   . Hypertension Mother   . Sleep apnea Mother   . Asthma Maternal Aunt   . Cancer Maternal Aunt        liver  . Cancer Paternal Uncle   . Cancer Maternal Uncle        prostate    Social History   Tobacco Use  . Smoking status: Never Smoker  . Smokeless tobacco: Never Used  Vaping Use  . Vaping Use: Never used  Substance Use Topics  . Alcohol use: Yes    Comment: rare; socially  . Drug use: No    Home Medications Prior to Admission medications   Medication Sig Start Date End Date Taking? Authorizing Provider  albuterol (PROAIR HFA) 108 (90 Base) MCG/ACT inhaler Inhale 2 puffs into the lungs every 4 (four) hours as needed for wheezing or shortness of breath. 06/03/19 06/02/20  Plotnikov, Georgina Quint, MD  ALPRAZolam (NIRAVAM) 0.5 MG dissolvable tablet Take 1 tablet (0.5 mg total) by mouth 2 (two) times daily as needed for anxiety. 05/06/19   Plotnikov, Georgina Quint, MD  amLODipine (NORVASC) 5 MG tablet Take 1 tablet (5 mg total) by mouth daily. 01/05/20   Plotnikov, Georgina Quint, MD  carvedilol (COREG) 25 MG tablet Take 1 tablet (25 mg total) by mouth 2 (two) times daily with a meal. 01/13/20   Plotnikov, Georgina Quint, MD  famotidine (PEPCID) 20 MG tablet One after supper 10/13/19   Nyoka Cowden, MD  ibuprofen (ADVIL,MOTRIN) 200 MG tablet Take 1,200 mg by mouth daily as needed for moderate pain (tooth pain).    [provider]  metFORMIN (GLUCOPHAGE) 500 MG tablet Take 1  tablet (500 mg total) by mouth daily with breakfast. 01/05/20   Plotnikov, Georgina Quint, MD  norelgestromin-ethinyl estradiol (ORTHO EVRA) 150-35 MCG/24HR transdermal patch Place 1 patch onto the skin once  a week. 12/16/20   Genia DelLavoie, Marie-Lyne, MD  pantoprazole (PROTONIX) 40 MG tablet Take 1 tablet (40 mg total) by mouth daily. Take 30-60 min before first meal of the day 10/13/19   Nyoka CowdenWert, Michael B, MD  potassium chloride (KLOR-CON) 10 MEQ tablet Take 1 tablet (10 mEq total) by mouth daily. 01/05/20   Plotnikov, Georgina QuintAleksei V, MD  topiramate (TOPAMAX) 25 MG capsule Take 25 mg by mouth 2 (two) times daily.    [provider]  triamterene-hydrochlorothiazide (MAXZIDE-25) 37.5-25 MG tablet Take 1 tablet by mouth daily. Annual appt due in January must see provider for future refills 01/05/20   Plotnikov, Georgina QuintAleksei V, MD  Vitamin D, Ergocalciferol, (DRISDOL) 1.25 MG (50000 UT) CAPS capsule TAKE ONE CAPSULE BY MOUTH EVERY 7 DAYS 10/06/19   Corinna CapraBrown, Angel A, DO    Allergies    Patient has no known allergies.  Review of Systems   Review of Systems  All other systems reviewed and are negative.   Physical Exam Updated Vital Signs BP (!) 148/99 (BP Location: Right Arm)   Pulse 62   Temp 98.9 F (37.2 C) (Oral)   Resp 17   Ht 5\' 5"  (1.651 m)   Wt 120.7 kg   LMP 03/01/2021   SpO2 100%   BMI 44.26 kg/m   Physical Exam Vitals and nursing note reviewed. Exam conducted with a chaperone present.  Constitutional:      General: She is not in acute distress.    Appearance: Normal appearance. She is not toxic-appearing.  HENT:     Head: Normocephalic and atraumatic.  Eyes:     General: No scleral icterus.    Conjunctiva/sclera: Conjunctivae normal.  Cardiovascular:     Rate and Rhythm: Normal rate.     Pulses: Normal pulses.  Pulmonary:     Effort: Pulmonary effort is normal. No respiratory distress.  Abdominal:     General: Abdomen is flat. There is no distension.     Palpations: Abdomen is soft.      Tenderness: There is no abdominal tenderness. There is no guarding.  Musculoskeletal:        General: Normal range of motion.  Skin:    General: Skin is dry.  Neurological:     Mental Status: She is alert and oriented to person, place, and time.     GCS: GCS eye subscore is 4. GCS verbal subscore is 5. GCS motor subscore is 6.  Psychiatric:        Mood and Affect: Mood normal.        Behavior: Behavior normal.        Thought Content: Thought content normal.     ED Results / Procedures / Treatments   Labs (all labs ordered are listed, but only abnormal results are displayed) Labs Reviewed  COMPREHENSIVE METABOLIC PANEL - Abnormal; Notable for the following components:      Result Value   Potassium 2.5 (*)    Chloride 93 (*)    Total Bilirubin 1.5 (*)    Anion gap 17 (*)    All other components within normal limits  URINALYSIS, ROUTINE W REFLEX MICROSCOPIC - Abnormal; Notable for the following components:   APPearance CLOUDY (*)    Specific Gravity, Urine >1.030 (*)    Hgb urine dipstick MODERATE (*)    Bilirubin Urine MODERATE (*)    Ketones, ur >80 (*)    Protein, ur 30 (*)    All other components within normal limits  URINALYSIS, MICROSCOPIC (REFLEX) -  Abnormal; Notable for the following components:   Bacteria, UA FEW (*)    All other components within normal limits  CBC WITH DIFFERENTIAL/PLATELET  LIPASE, BLOOD  PREGNANCY, URINE  MAGNESIUM    EKG None  Radiology No results found.  Procedures Procedures   Medications Ordered in ED Medications  sodium chloride 0.9 % bolus 1,000 mL (0 mLs Intravenous Stopped 03/21/21 2358)  ondansetron (ZOFRAN) injection 4 mg (4 mg Intravenous Given 03/21/21 2231)  pantoprazole (PROTONIX) injection 40 mg (40 mg Intravenous Given 03/21/21 2306)  potassium chloride (KLOR-CON) packet 60 mEq (60 mEq Oral Given 03/22/21 0003)    ED Course  I have reviewed the triage vital signs and the nursing notes.  Pertinent labs & imaging  results that were available during my care of the patient were reviewed by me and considered in my medical decision making (see chart for details).    MDM Rules/Calculators/A&P                          Jayra L Pletz was evaluated in Emergency Department on 03/22/2021 for the symptoms described in the history of present illness. She was evaluated in the context of the global COVID-19 pandemic, which necessitated consideration that the patient might be at risk for infection with the SARS-CoV-2 virus that causes COVID-19. Institutional protocols and algorithms that pertain to the evaluation of patients at risk for COVID-19 are in a state of rapid change based on information released by regulatory bodies including the CDC and federal and state organizations. These policies and algorithms were followed during the patient's care in the ED.  I personally reviewed patient's medical chart and all notes from triage and staff during today's encounter. I have also ordered and reviewed all labs and imaging that I felt to be medically necessary in the evaluation of this patient's complaints and with consideration of their physical exam. If needed, translation services were available and utilized.   Given her inability to tolerate food and liquid by mouth in context of all off of the gastric bypass performed 11/2020, concern for stricture.  She already has been in communication with her general surgeon about scheduling an upper endoscopy, however, has not yet heard about a date or time.   Laboratory work-up notable for hypokalemia to 2.5, consistent with her history of diminished intake, nausea, and vomiting.  Magnesium within normal limits.  UA suggesting dehydration.  Lower suspicion for UTI as she denies any urinary symptoms.  Mildly elevated anion gap, likely due to emesis.  Repleted potassium with 60 mEq potassium solution here in ED.  She was able to keep what she drank down, but did not like the taste  whatsoever.  Offered IV replacement but she was eager to go home.  Offered admission to our hospitalist group given inability to eat and drink and significant hypokalemia.  However, stated that ultimately it will be her bariatric team who will need to see her.  She states that she would rather call them tomorrow and be admitted to their services.  I even discussed her going to [facility] ER tonight to bed admitted, but again, she declined and instead would rather just go home and get some rest before calling them first thing in the morning.  She understands that her potassium is critically low.    No peritoneal signs on my exam and she is still passing gas.  No elevated WBC.  Doubt obstruction.  In interim, encouraging clear liquids  only.  Emphasizing importance of follow-up first-thing tomorrow AM.  Discussed case with Dr. Lockie Mola who agrees with assessment and plan.   Final Clinical Impression(s) / ED Diagnoses Final diagnoses:  Hypokalemia    Rx / DC Orders ED Discharge Orders    None       Lorelee New, PA-C 03/22/21 0037    Lorelee New, PA-C 03/22/21 0058    Virgina Norfolk, DO 03/22/21 1630

## 2021-03-21 NOTE — ED Notes (Signed)
Assumed care of this patient. Vitals taken. A&Ox4. Respirations regular/unlabored. Connected to bp and pulse ox. Respirations regular/unlabored. Family at bedside.

## 2021-03-21 NOTE — ED Provider Notes (Incomplete)
[facility] EMERGENCY DEPARTMENT Provider Note   CSN: [id number] Arrival date & time: 03/21/21  1859     History Chief Complaint  Patient presents with  . Abdominal Pain  . Vomiting    Kim Barnes is a 45 y.o. female with past medical history of HTN, PCOS, and s/p gastric bypass performed 11/2020 followed by [facility] general surgery team who presents the ED with complaints of generalized abdominal cramping, nausea, and emesis x1 week.  I reviewed patient's medical record and she had a telemedicine encounter with an NP at her bariatric facility on 03/17/2021 during which she e  Patient reports that for the past week she has not been able to keep food and liquid down.  She states that is due to her nausea symptoms.  She is unsure why.  She is also now having some abdominal cramping which she suspects may be hunger pains.  She reports that she reached out to her general surgery team who has planned for her to have upper EGD.  She takes omeprazole for reflux disease.  Her last bowel movement was a few days ago, normal.  Nonbloody.    She denies any fevers or chills, headache or dizziness, chest pain or difficulty breathing, palpitations, urinary symptoms, vaginal pain or discharge, or other symptoms.  She is accompanied by her friend who is at bedside.  She states that she also has a primary care provider, but she has not called them regarding these symptoms.  HPI     Past Medical History:  Diagnosis Date  . Anxiety   . Asthma    prn inhaler  . Back pain   . Bell's palsy   . Carpal tunnel syndrome of left wrist 01/2013  . Chest pain   . Cold hands and feet   . Cough   . Depression   . Dry mouth   . Dry skin   . Fatigue   . GERD (gastroesophageal reflux disease)    occasional; no current med.  [name] History of brain tumor 1996   no neurologic deficits  . HTN (hypertension)    under control with med., has been on med. x 5 yr.  . Joint pain   .  Obesity   . Obstructive sleep apnea   . PCOS (polycystic ovarian syndrome)   . Polycystic ovarian syndrome    no current med.; irregular periods  . Shortness of breath   . Stress   . Swelling of extremity   . Wheezing     Patient Active Problem List   Diagnosis Date Noted  . Anxiety 01/05/2020  . Morbid obesity due to excess calories (HCC) 10/13/2019  . Other hyperlipidemia 03/04/2019  . Class 3 severe obesity with serious comorbidity and body mass index (BMI) of 60.0 to 69.9 in adult (HCC) 11/03/2018  . Thoracic back pain 04/28/2018  . Low back pain 04/28/2018  . Vitamin D deficiency 04/23/2018  . Prediabetes 04/23/2018  . Other fatigue 02/26/2018  . Shortness of breath on exertion 02/26/2018  . Obstructive sleep apnea syndrome 02/26/2018  . LLQ abdominal pain 11/07/2017  . Bell palsy 02/05/2017  . OSA on CPAP 10/20/2015  . PCOS (polycystic ovarian syndrome) 03/31/2015  . Snoring 03/31/2015  . Upper airway cough syndrome 05/25/2014  . Well adult exam 10/01/2013  . Asthma exacerbation 08/27/2011  . GERD 09/20/2010  . Cough 05/24/2010  . DEPRESSION 12/06/2009  . Edema 05/12/2009  . DYSPNEA 05/12/2009  . Chest pain, unspecified  05/12/2009  . Asthma 07/07/2008  . AMENORRHEA 07/07/2008  . Essential hypertension 09/08/2007  . Hyperglycemia 09/08/2007    Past Surgical History:  Procedure Laterality Date  . BRAIN SURGERY  1996   exc. tumor  . CARPAL TUNNEL RELEASE Left 02/10/2013   Procedure: CARPAL TUNNEL RELEASE;  Surgeon: Nicki Reaper, MD;  Location: [facility];  Service: Orthopedics;  Laterality: Left;  ANESTHESIA: IV REGIONAL FAB  . HYSTEROSCOPY WITH D & C  02/21/2006  . HYSTEROSCOPY WITH D & C  12/17/2011   Procedure: DILATATION AND CURETTAGE /HYSTEROSCOPY;  Surgeon: Meriel Pica, MD;  Location: [facility];  Service: Gynecology;  Laterality: N/A;  . LEEP  01/07/2004  . TONSILLECTOMY  09/21/2002     OB History    Gravida  2   Para      Term       Preterm      AB  2   Living  0     SAB  1   IAB  1   Ectopic      Multiple      Live Births  0           Family History  Problem Relation Age of Onset  . Hypertension Other   . Lung cancer Father        Smoker  . Cancer Father        lung/ smoker  . Heart disease Father   . Alcoholism Father   . Breast cancer Maternal Grandmother   . Cancer Maternal Grandmother        breast  . Emphysema Paternal Grandmother   . Asthma Mother   . Hyperlipidemia Mother   . Hypertension Mother   . Sleep apnea Mother   . Asthma Maternal Aunt   . Cancer Maternal Aunt        liver  . Cancer Paternal Uncle   . Cancer Maternal Uncle        prostate    Social History   Tobacco Use  . Smoking status: Never Smoker  . Smokeless tobacco: Never Used  Vaping Use  . Vaping Use: Never used  Substance Use Topics  . Alcohol use: Yes    Comment: rare; socially  . Drug use: No    Home Medications Prior to Admission medications   Medication Sig Start Date End Date Taking? Authorizing Provider  albuterol (PROAIR HFA) 108 (90 Base) MCG/ACT inhaler Inhale 2 puffs into the lungs every 4 (four) hours as needed for wheezing or shortness of breath. 06/03/19 06/02/20  Plotnikov, Georgina Quint, MD  ALPRAZolam (NIRAVAM) 0.5 MG dissolvable tablet Take 1 tablet (0.5 mg total) by mouth 2 (two) times daily as needed for anxiety. 05/06/19   Plotnikov, Georgina Quint, MD  amLODipine (NORVASC) 5 MG tablet Take 1 tablet (5 mg total) by mouth daily. 01/05/20   Plotnikov, Georgina Quint, MD  carvedilol (COREG) 25 MG tablet Take 1 tablet (25 mg total) by mouth 2 (two) times daily with a meal. 01/13/20   Plotnikov, Georgina Quint, MD  famotidine (PEPCID) 20 MG tablet One after supper 10/13/19   Nyoka Cowden, MD  ibuprofen (ADVIL,MOTRIN) 200 MG tablet Take 1,200 mg by mouth daily as needed for moderate pain (tooth pain).    [provider]  metFORMIN (GLUCOPHAGE) 500 MG tablet Take 1 tablet (500 mg total) by mouth daily  with breakfast. 01/05/20   Plotnikov, Georgina Quint, MD  norelgestromin-ethinyl estradiol (ORTHO EVRA) 150-35 MCG/24HR transdermal patch  Place 1 patch onto the skin once a week. 12/16/20   [name], MD  pantoprazole (PROTONIX) 40 MG tablet Take 1 tablet (40 mg total) by mouth daily. Take 30-60 min before first meal of the day 10/13/19   Nyoka Cowden, MD  potassium chloride (KLOR-CON) 10 MEQ tablet Take 1 tablet (10 mEq total) by mouth daily. 01/05/20   Plotnikov, Georgina Quint, MD  topiramate (TOPAMAX) 25 MG capsule Take 25 mg by mouth 2 (two) times daily.    [provider]  triamterene-hydrochlorothiazide (MAXZIDE-25) 37.5-25 MG tablet Take 1 tablet by mouth daily. Annual appt due in January must see provider for future refills 01/05/20   Plotnikov, Georgina Quint, MD  Vitamin D, Ergocalciferol, (DRISDOL) 1.25 MG (50000 UT) CAPS capsule TAKE ONE CAPSULE BY MOUTH EVERY 7 DAYS 10/06/19   Corinna Capra A, DO    Allergies    Patient has no known allergies.  Review of Systems   Review of Systems  All other systems reviewed and are negative.   Physical Exam Updated Vital Signs BP (!) 144/86 (BP Location: Left Arm)   Pulse (!) 58   Temp 98.9 F (37.2 C) (Oral)   Resp 17   Ht 5\' 5"  (1.651 m)   Wt 120.7 kg   LMP 03/01/2021   SpO2 100%   BMI 44.26 kg/m   Physical Exam Vitals and nursing note reviewed. Exam conducted with a chaperone present.  Constitutional:      General: She is not in acute distress.    Appearance: Normal appearance. She is not toxic-appearing.  HENT:     Head: Normocephalic and atraumatic.  Eyes:     General: No scleral icterus.    Conjunctiva/sclera: Conjunctivae normal.  Cardiovascular:     Rate and Rhythm: Normal rate.     Pulses: Normal pulses.  Pulmonary:     Effort: Pulmonary effort is normal. No respiratory distress.  Abdominal:     General: Abdomen is flat. There is no distension.     Palpations: Abdomen is soft.     Tenderness: There is no  abdominal tenderness. There is no guarding.  Musculoskeletal:        General: Normal range of motion.  Skin:    General: Skin is dry.  Neurological:     Mental Status: She is alert and oriented to person, place, and time.     GCS: GCS eye subscore is 4. GCS verbal subscore is 5. GCS motor subscore is 6.  Psychiatric:        Mood and Affect: Mood normal.        Behavior: Behavior normal.        Thought Content: Thought content normal.     ED Results / Procedures / Treatments   Labs (all labs ordered are listed, but only abnormal results are displayed) Labs Reviewed  COMPREHENSIVE METABOLIC PANEL - Abnormal; Notable for the following components:      Result Value   Potassium 2.5 (*)    Chloride 93 (*)    Total Bilirubin 1.5 (*)    Anion gap 17 (*)    All other components within normal limits  URINALYSIS, ROUTINE W REFLEX MICROSCOPIC - Abnormal; Notable for the following components:   APPearance CLOUDY (*)    Specific Gravity, Urine >1.030 (*)    Hgb urine dipstick MODERATE (*)    Bilirubin Urine MODERATE (*)    Ketones, ur >80 (*)    Protein, ur 30 (*)    All other  components within normal limits  URINALYSIS, MICROSCOPIC (REFLEX) - Abnormal; Notable for the following components:   Bacteria, UA FEW (*)    All other components within normal limits  CBC WITH DIFFERENTIAL/PLATELET  LIPASE, BLOOD  PREGNANCY, URINE  MAGNESIUM    EKG None  Radiology No results found.  Procedures Procedures {Remember to document critical care time when appropriate:1}  Medications Ordered in ED Medications  sodium chloride 0.9 % bolus 1,000 mL (1,000 mLs Intravenous New Bag/Given 03/21/21 2232)  ondansetron (ZOFRAN) injection 4 mg (4 mg Intravenous Given 03/21/21 2231)  pantoprazole (PROTONIX) injection 40 mg (40 mg Intravenous Given 03/21/21 2306)    ED Course  I have reviewed the triage vital signs and the nursing notes.  Pertinent labs & imaging results that were available during my  care of the patient were reviewed by me and considered in my medical decision making (see chart for details).    MDM Rules/Calculators/A&P                          Kim Barnes was evaluated in Emergency Department on 03/21/2021 for the symptoms described in the history of present illness. She was evaluated in the context of the global COVID-19 pandemic, which necessitated consideration that the patient might be at risk for infection with the SARS-CoV-2 virus that causes COVID-19. Institutional protocols and algorithms that pertain to the evaluation of patients at risk for COVID-19 are in a state of rapid change based on information released by regulatory bodies including the CDC and federal and state organizations. These policies and algorithms were followed during the patient's care in the ED.  I personally reviewed patient's medical chart and all notes from triage and staff during today's encounter. I have also ordered and reviewed all labs and imaging that I felt to be medically necessary in the evaluation of this patient's complaints and with consideration of their physical exam. If needed, translation services were available and utilized.   ***  Given her inability to tolerate food and liquid by mouth in context of all off of the gastric bypass performed 11/2020, concern for stricture.  She already has been in communication with her general surgeon about scheduling an upper endoscopy.    Final Clinical Impression(s) / ED Diagnoses Final diagnoses:  None    Rx / DC Orders ED Discharge Orders    None

## 2021-03-21 NOTE — ED Triage Notes (Signed)
C/o vomiting x 1 week , post op Gastric bypass dec 2021,

## 2021-03-22 NOTE — Discharge Instructions (Addendum)
Please call your bariatric team tomorrow morning, first thing.  You will likely need to be admitted to their services for ongoing evaluation and management of your inability to eat and drink.  Suspect that is likely a stricture secondary to your gastric bypass.  In the interim, continue with clear liquid diet.    Need to have urgent upper endoscopy performed and other work-up and intervention.  Your potassium is dangerously low and it was only partially repleted here in the ER.  You will need ongoing evaluation and management.

## 2021-04-17 ENCOUNTER — Ambulatory Visit: Payer: 59 | Attending: Internal Medicine

## 2021-04-17 DIAGNOSIS — Z20822 Contact with and (suspected) exposure to covid-19: Secondary | ICD-10-CM

## 2021-04-18 LAB — NOVEL CORONAVIRUS, NAA: SARS-CoV-2, NAA: DETECTED — AB

## 2021-04-18 LAB — SARS-COV-2, NAA 2 DAY TAT

## 2021-04-25 ENCOUNTER — Ambulatory Visit: Payer: 59 | Attending: Critical Care Medicine

## 2021-04-25 DIAGNOSIS — Z20822 Contact with and (suspected) exposure to covid-19: Secondary | ICD-10-CM

## 2021-04-26 LAB — NOVEL CORONAVIRUS, NAA: SARS-CoV-2, NAA: NOT DETECTED

## 2021-04-26 LAB — SARS-COV-2, NAA 2 DAY TAT

## 2021-07-29 IMAGING — MG MM DIGITAL DIAGNOSTIC UNILAT*R* W/ TOMO W/ CAD
4 series · 4 of 12 positions shown · non-contrast
Comparison: Previous exam(s).

ACR Breast Density Category a: The breast tissue is almost entirely
fatty.

CLINICAL DATA: Six-month follow-up for likely benign right breast
masses.

EXAM:
DIGITAL DIAGNOSTIC RIGHT MAMMOGRAM WITH CAD AND TOMO
ULTRASOUND RIGHT BREAST

[R MLO synth-2D]
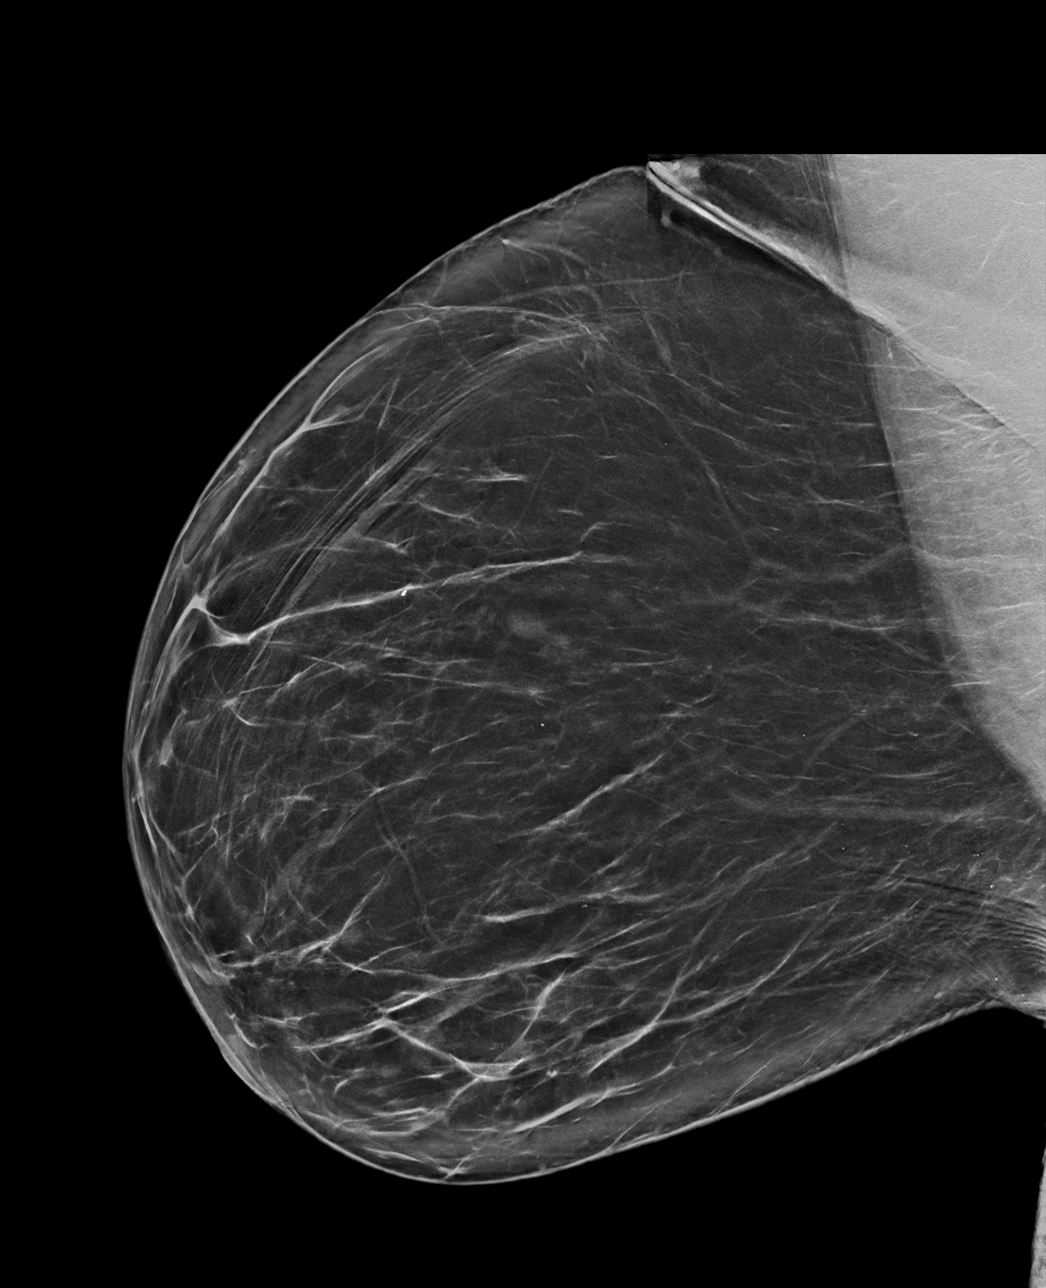

[R CC synth-2D]
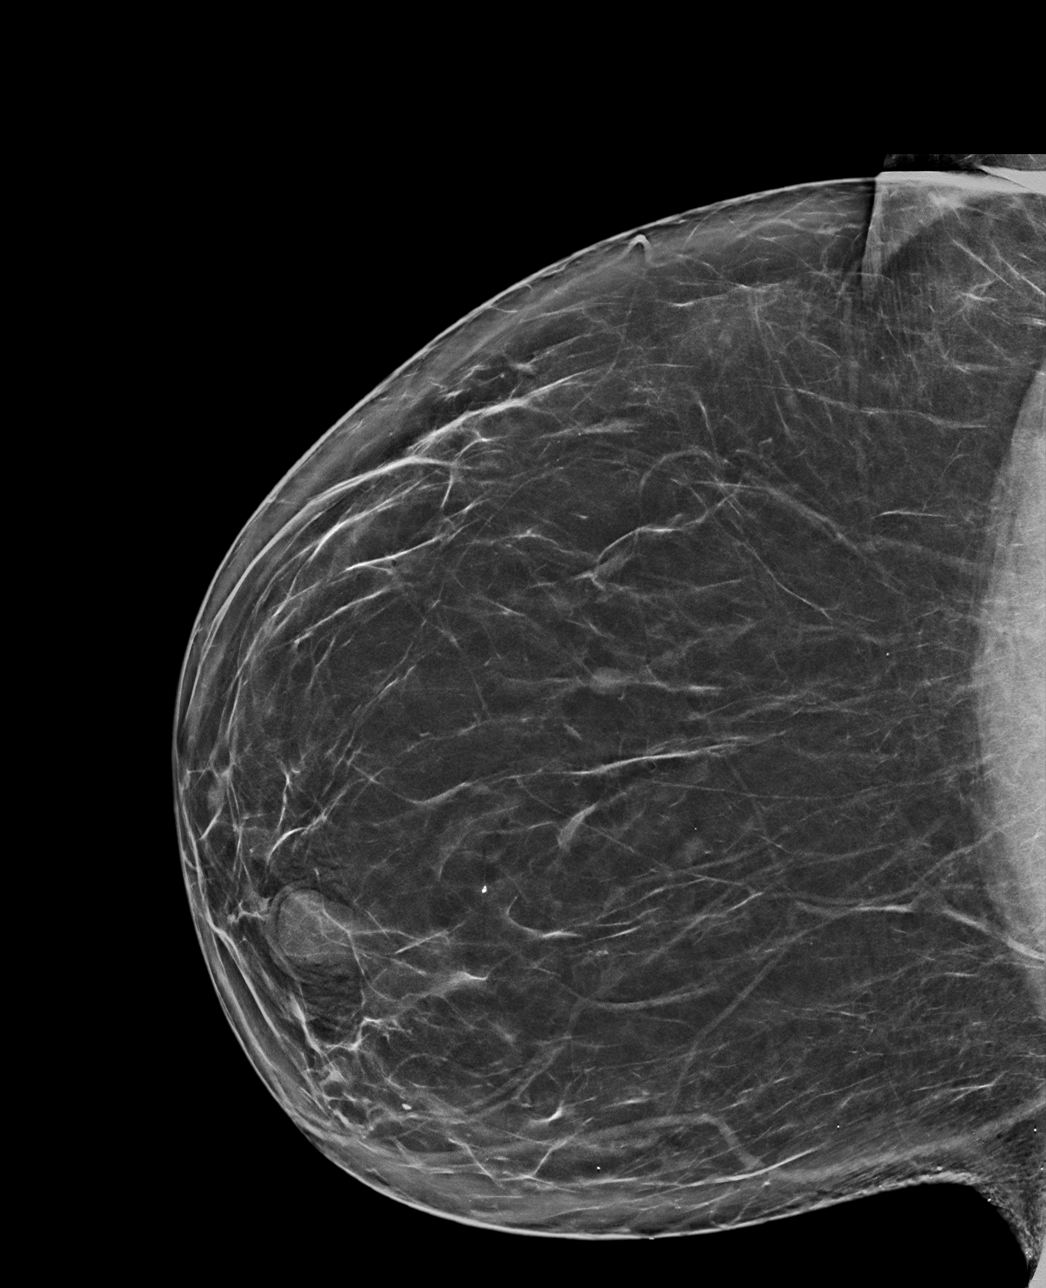

[R MLO tomo · tomo slice 49/96.0]
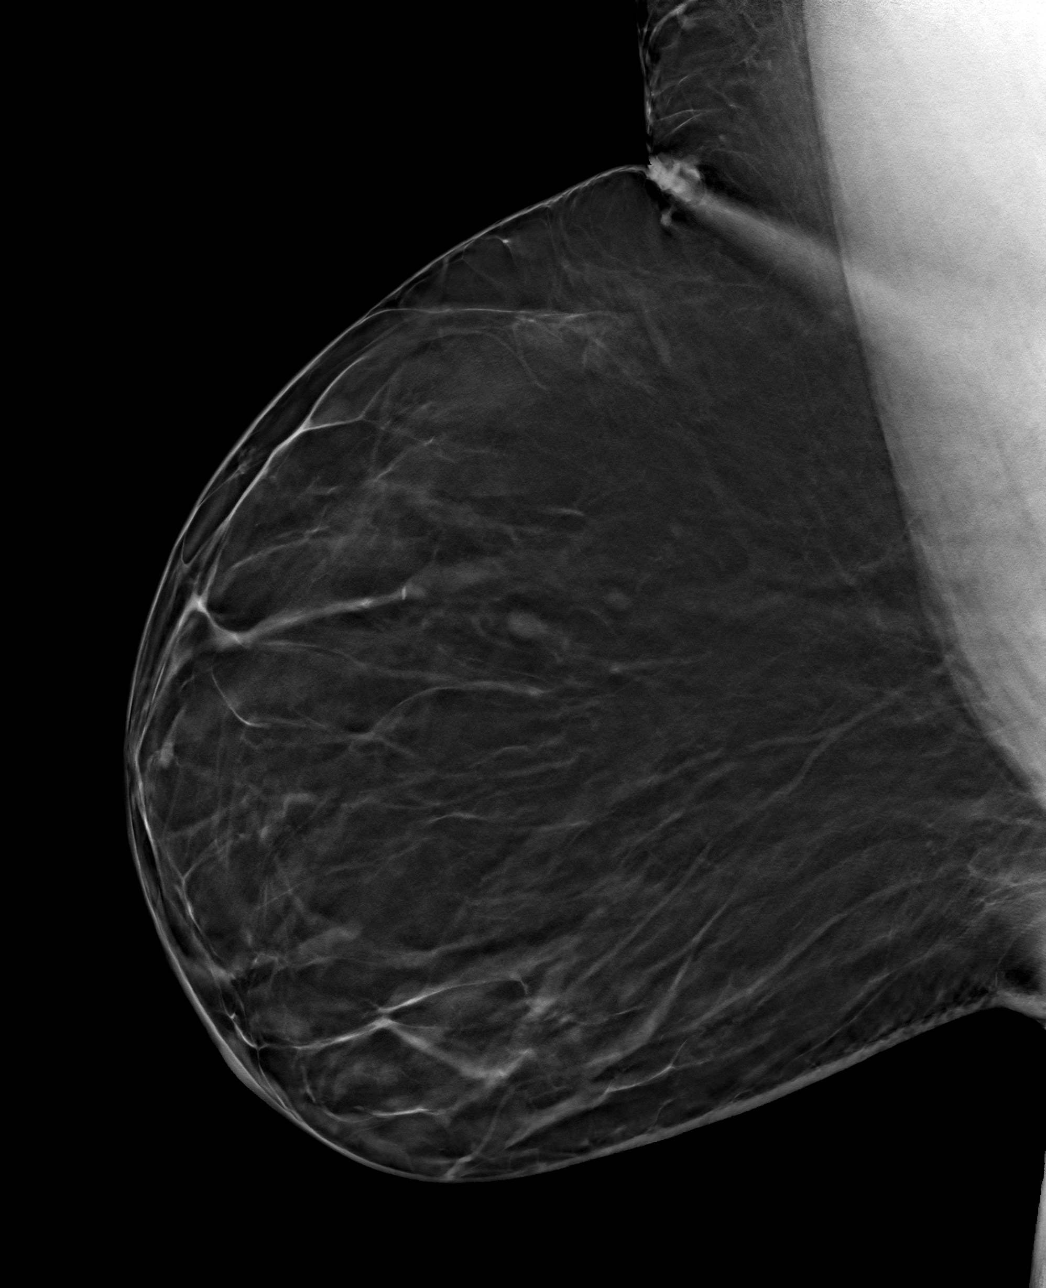

[R CC tomo · tomo slice 43/86.0]
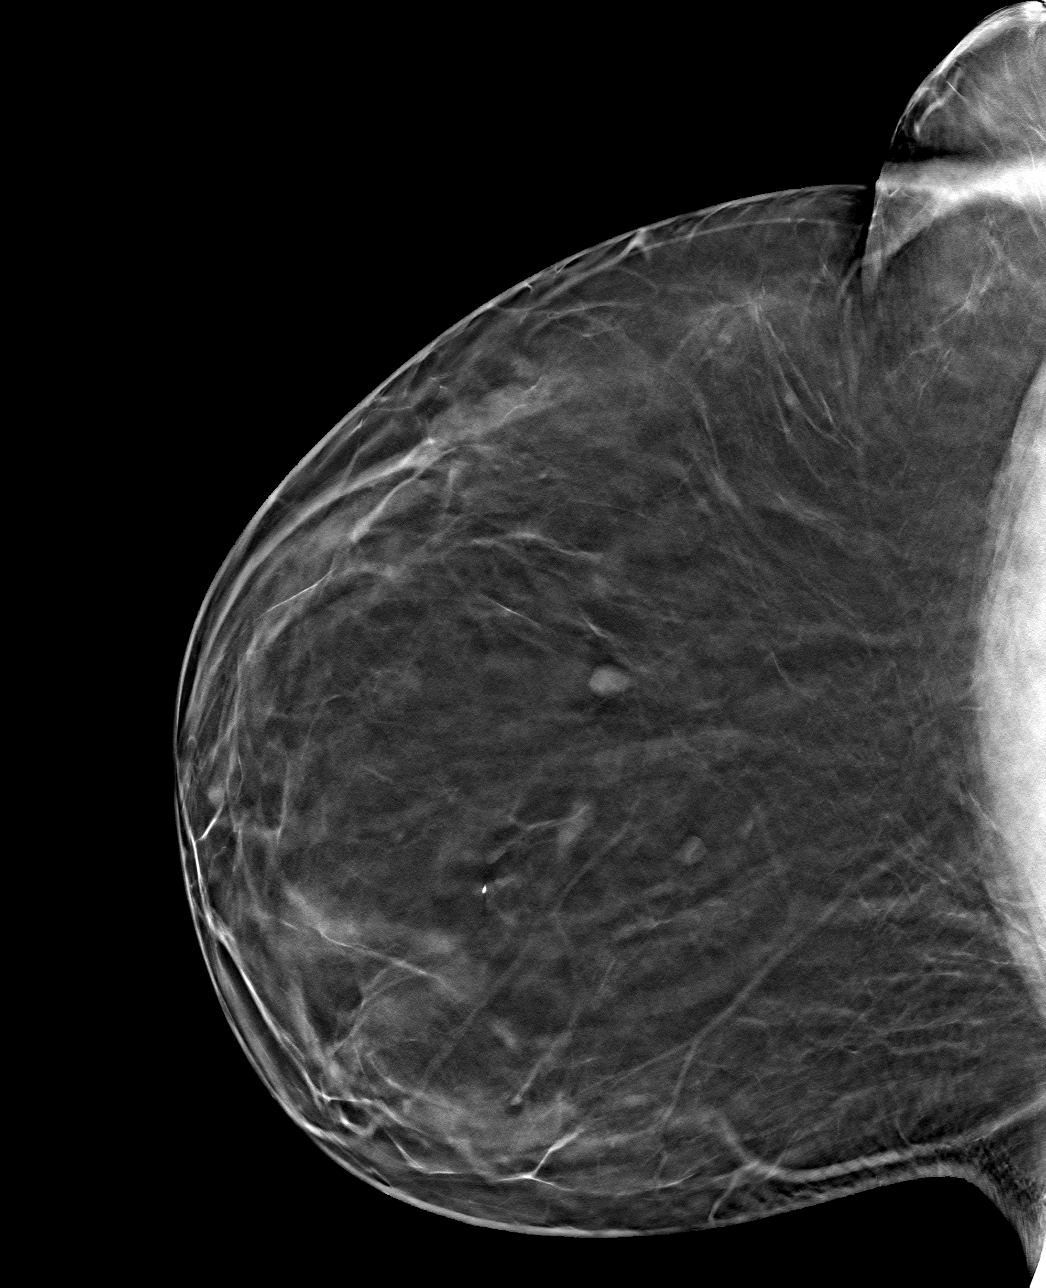

[4 of 12 positions shown; findings below may reference images not displayed]

FINDINGS: The 2 masses in the superior right breast are mammographically
stable. No suspicious calcifications, masses or areas of distortion
are seen in the right breast.

Mammographic images were processed with CAD.

Ultrasound of the right breast at 12 30, 9 cm from the nipple
demonstrates a stable oval circumscribed mass measuring 5 x 2 x 4
mm, previously measuring 5 x 2 x 4 mm.

Ultrasound of the right breast at 11 30, 9 cm from the nipple
demonstrates a stable circumscribed oval mass measuring 6 x 4 x 6
mm, previously measuring 7 x 4 x 6 mm.
IMPRESSION: The 2 likely benign right breast masses are stable.

RECOMMENDATION:
Six-month follow-up bilateral diagnostic mammogram and right breast
ultrasound.

I have discussed the findings and recommendations with the patient.
If applicable, a reminder letter will be sent to the patient
regarding the next appointment.

BI-RADS CATEGORY  3: Probably benign.

## 2021-07-29 IMAGING — US US BREAST*R* LIMITED INC AXILLA
1 series · 10 of 10 positions shown · non-contrast
Comparison: Previous exam(s).

ACR Breast Density Category a: The breast tissue is almost entirely
fatty.

CLINICAL DATA: Six-month follow-up for likely benign right breast
masses.

EXAM:
DIGITAL DIAGNOSTIC RIGHT MAMMOGRAM WITH CAD AND TOMO
ULTRASOUND RIGHT BREAST

[Series 2: us breast*right* limited inc axilla · 0.08mm/px · 10 of 10 slices shown]
[im 1/10]
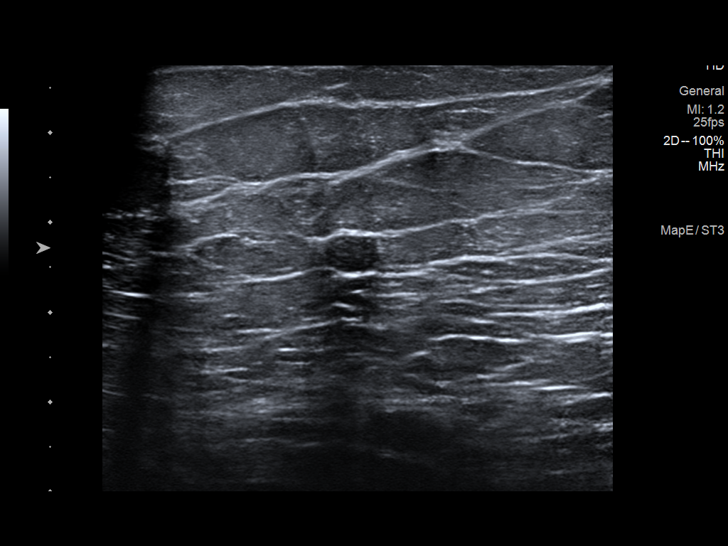
[im 2/10]
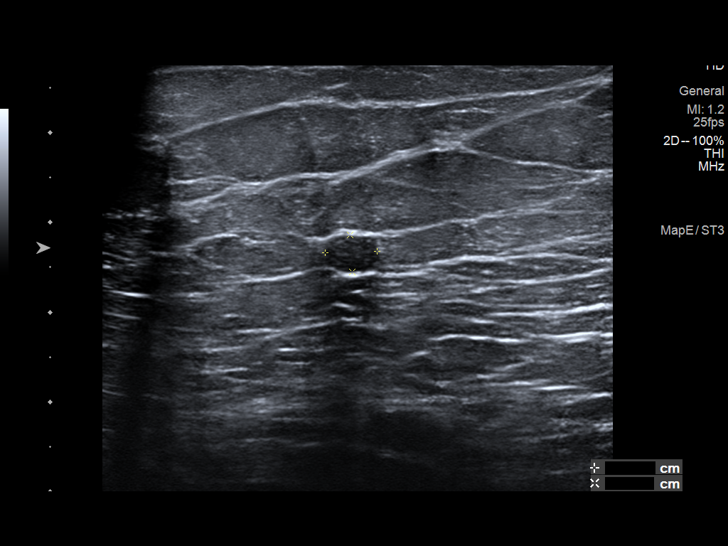
[im 3/10]
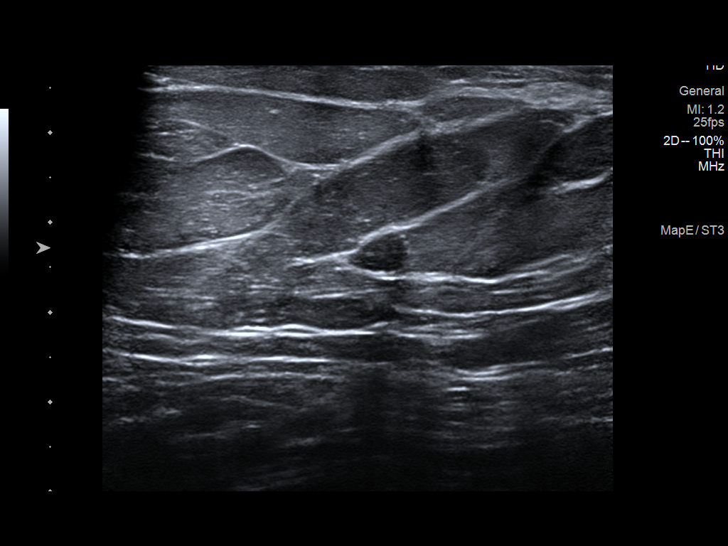
[im 4/10]
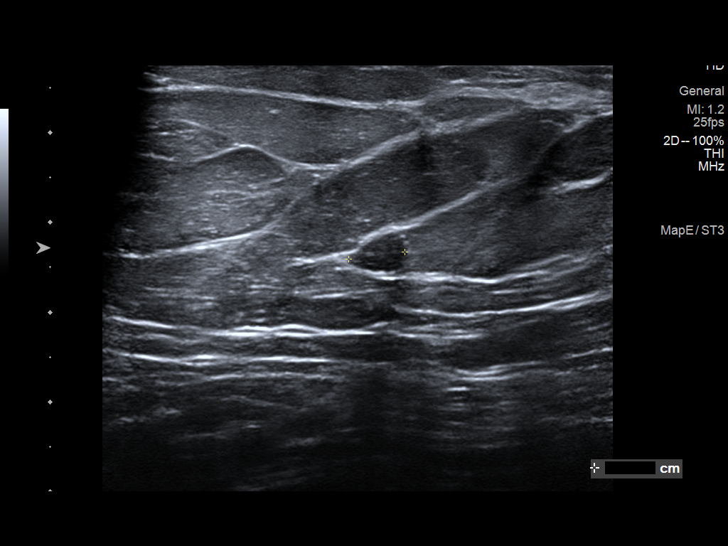
[im 5/10]
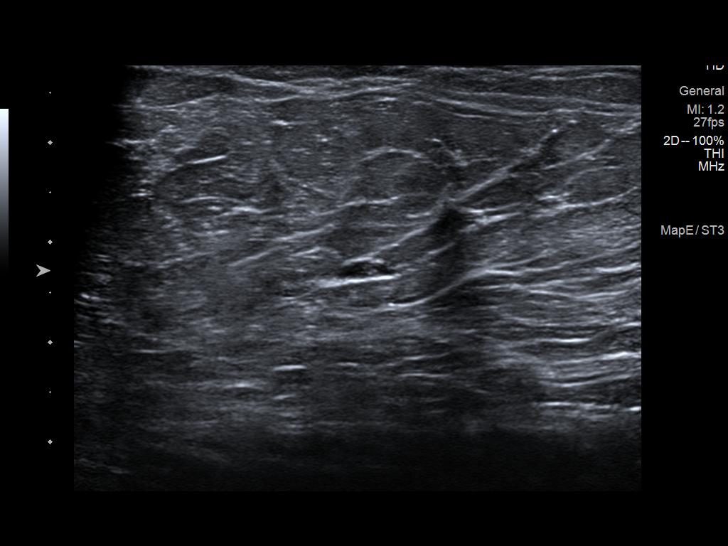
[im 6/10]
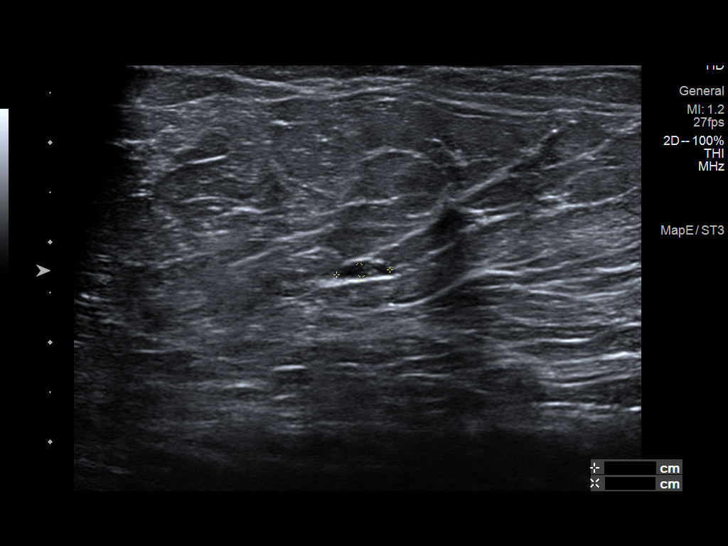
[im 7/10]
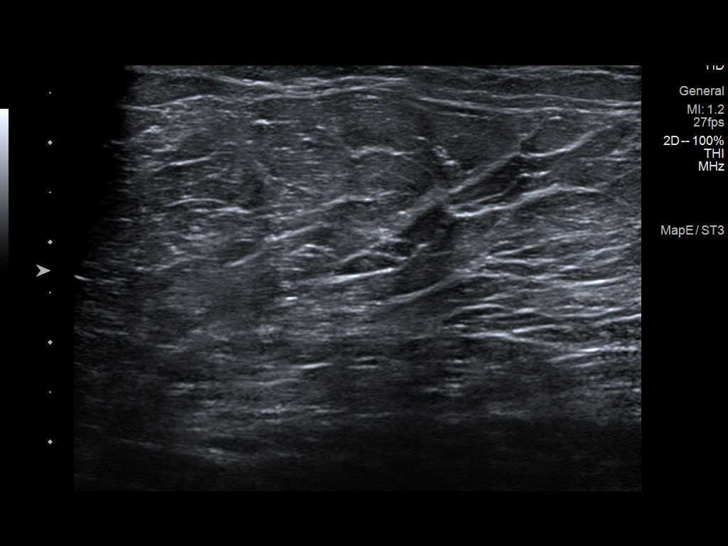
[im 8/10]
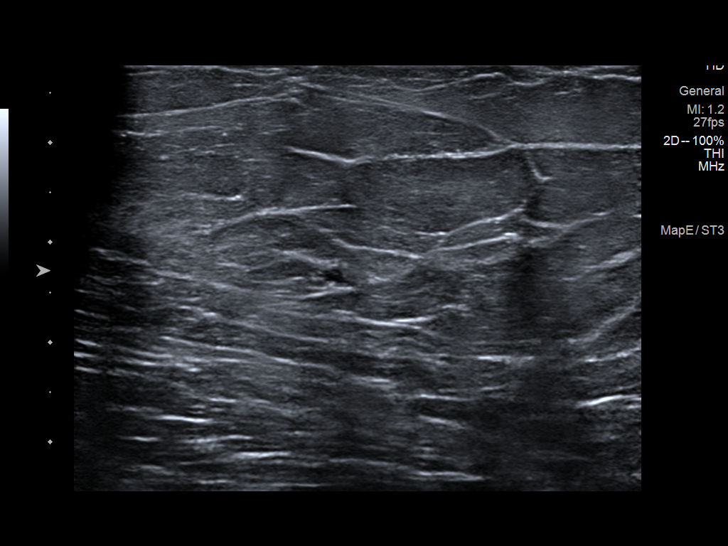
[im 9/10]
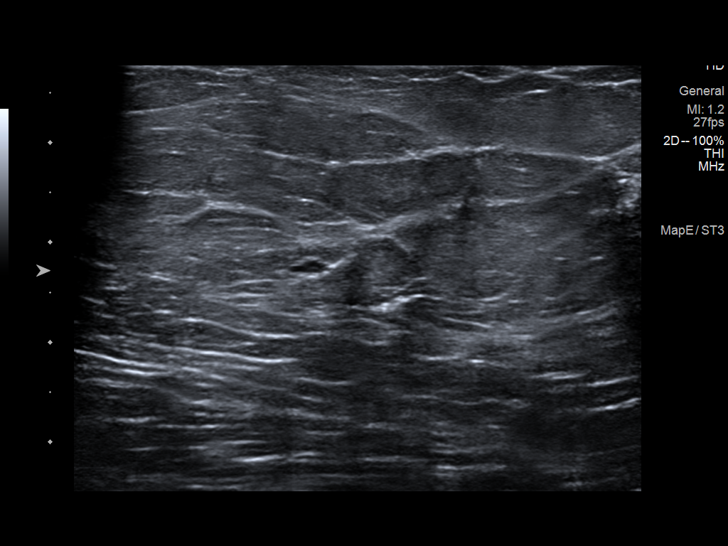
[im 10/10]
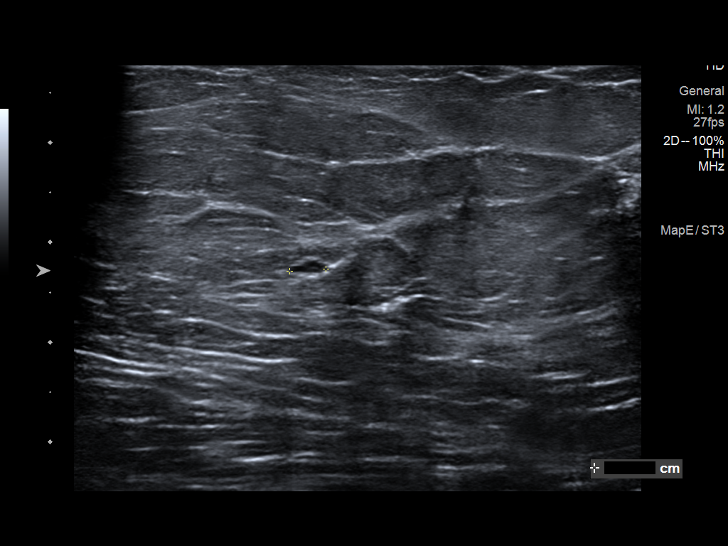

[10 of 10 positions shown; findings below may reference images not displayed]

FINDINGS: The 2 masses in the superior right breast are mammographically
stable. No suspicious calcifications, masses or areas of distortion
are seen in the right breast.

Mammographic images were processed with CAD.

Ultrasound of the right breast at 12 30, 9 cm from the nipple
demonstrates a stable oval circumscribed mass measuring 5 x 2 x 4
mm, previously measuring 5 x 2 x 4 mm.

Ultrasound of the right breast at 11 30, 9 cm from the nipple
demonstrates a stable circumscribed oval mass measuring 6 x 4 x 6
mm, previously measuring 7 x 4 x 6 mm.
IMPRESSION: The 2 likely benign right breast masses are stable.

RECOMMENDATION:
Six-month follow-up bilateral diagnostic mammogram and right breast
ultrasound.

I have discussed the findings and recommendations with the patient.
If applicable, a reminder letter will be sent to the patient
regarding the next appointment.

BI-RADS CATEGORY  3: Probably benign.

## 2021-08-29 ENCOUNTER — Other Ambulatory Visit: Payer: Self-pay

## 2021-08-29 ENCOUNTER — Ambulatory Visit (INDEPENDENT_AMBULATORY_CARE_PROVIDER_SITE_OTHER): Payer: 59 | Admitting: Internal Medicine

## 2021-08-29 ENCOUNTER — Encounter: Payer: Self-pay | Admitting: Internal Medicine

## 2021-08-29 VITALS — BP 142/92 | HR 65 | Temp 98.2°F | Ht 66.0 in | Wt 217.0 lb

## 2021-08-29 DIAGNOSIS — R739 Hyperglycemia, unspecified: Secondary | ICD-10-CM

## 2021-08-29 DIAGNOSIS — Z0000 Encounter for general adult medical examination without abnormal findings: Secondary | ICD-10-CM | POA: Diagnosis not present

## 2021-08-29 DIAGNOSIS — I10 Essential (primary) hypertension: Secondary | ICD-10-CM | POA: Diagnosis not present

## 2021-08-29 DIAGNOSIS — Z9884 Bariatric surgery status: Secondary | ICD-10-CM | POA: Insufficient documentation

## 2021-08-29 DIAGNOSIS — E559 Vitamin D deficiency, unspecified: Secondary | ICD-10-CM

## 2021-08-29 LAB — CBC WITH DIFFERENTIAL/PLATELET
Basophils Absolute: 0 10*3/uL (ref 0.0–0.1)
Basophils Relative: 0.7 % (ref 0.0–3.0)
Eosinophils Absolute: 0.2 10*3/uL (ref 0.0–0.7)
Eosinophils Relative: 3.3 % (ref 0.0–5.0)
HCT: 32.6 % — ABNORMAL LOW (ref 36.0–46.0)
Hemoglobin: 10.8 g/dL — ABNORMAL LOW (ref 12.0–15.0)
Lymphocytes Relative: 36.9 % (ref 12.0–46.0)
Lymphs Abs: 1.7 10*3/uL (ref 0.7–4.0)
MCHC: 33.1 g/dL (ref 30.0–36.0)
MCV: 91.6 fl (ref 78.0–100.0)
Monocytes Absolute: 0.3 10*3/uL (ref 0.1–1.0)
Monocytes Relative: 6.2 % (ref 3.0–12.0)
Neutro Abs: 2.4 10*3/uL (ref 1.4–7.7)
Neutrophils Relative %: 52.9 % (ref 43.0–77.0)
Platelets: 316 10*3/uL (ref 150.0–400.0)
RBC: 3.56 Mil/uL — ABNORMAL LOW (ref 3.87–5.11)
RDW: 14.4 % (ref 11.5–15.5)
WBC: 4.6 10*3/uL (ref 4.0–10.5)

## 2021-08-29 LAB — LIPID PANEL
Cholesterol: 188 mg/dL (ref 0–200)
HDL: 54.4 mg/dL (ref >39.00)
LDL Cholesterol: 119 mg/dL — ABNORMAL HIGH (ref 0–99)
NonHDL: 133.66
Total CHOL/HDL Ratio: 3
Triglycerides: 75 mg/dL (ref 0.0–149.0)
VLDL: 15 mg/dL (ref 0.0–40.0)

## 2021-08-29 LAB — COMPREHENSIVE METABOLIC PANEL
ALT: 14 U/L (ref 0–35)
AST: 19 U/L (ref 0–37)
Albumin: 3.6 g/dL (ref 3.5–5.2)
Alkaline Phosphatase: 60 U/L (ref 39–117)
BUN: 10 mg/dL (ref 6–23)
CO2: 31 mEq/L (ref 19–32)
Calcium: 9.3 mg/dL (ref 8.4–10.5)
Chloride: 103 mEq/L (ref 96–112)
Creatinine, Ser: 0.76 mg/dL (ref 0.40–1.20)
GFR: 96.11 mL/min (ref >60.00)
Glucose, Bld: 68 mg/dL — ABNORMAL LOW (ref 70–99)
Potassium: 3.3 mEq/L — ABNORMAL LOW (ref 3.5–5.1)
Sodium: 142 mEq/L (ref 135–145)
Total Bilirubin: 0.4 mg/dL (ref 0.2–1.2)
Total Protein: 7.1 g/dL (ref 6.0–8.3)

## 2021-08-29 LAB — T4, FREE: Free T4: 0.64 ng/dL (ref 0.60–1.60)

## 2021-08-29 LAB — VITAMIN D 25 HYDROXY (VIT D DEFICIENCY, FRACTURES): VITD: 17.63 ng/mL — ABNORMAL LOW (ref 30.00–100.00)

## 2021-08-29 LAB — TSH: TSH: 1.95 u[IU]/mL (ref 0.35–5.50)

## 2021-08-29 LAB — URIC ACID: Uric Acid, Serum: 5.7 mg/dL (ref 2.4–7.0)

## 2021-08-29 LAB — HEMOGLOBIN A1C: Hgb A1c MFr Bld: 4.9 % (ref 4.6–6.5)

## 2021-08-29 LAB — MAGNESIUM: Magnesium: 1.9 mg/dL (ref 1.5–2.5)

## 2021-08-29 LAB — VITAMIN B12: Vitamin B-12: 230 pg/mL (ref 211–911)

## 2021-08-29 MED ORDER — AMLODIPINE BESYLATE 5 MG PO TABS: 5.0000 mg | ORAL_TABLET | Freq: Every day | ORAL | 11 refills | Status: DC

## 2021-08-29 NOTE — Assessment & Plan Note (Signed)
S/p stomach bypass on 12/06/20 - Dr Lowell Guitar - lost wt from 358 to 217 lbs

## 2021-08-29 NOTE — Assessment & Plan Note (Signed)
S/p stomach bypass on 12/06/20 - Dr Lowell Guitar - lost wt from 358 to 217 lbs Check labs

## 2021-08-29 NOTE — Assessment & Plan Note (Signed)
Check Vit D 

## 2021-08-29 NOTE — Assessment & Plan Note (Signed)

## 2021-08-29 NOTE — Assessment & Plan Note (Signed)
Better after wt loss Check A1c

## 2021-08-29 NOTE — Assessment & Plan Note (Signed)
S/p stomach bypass on 12/06/20 - Dr Powell - lost wt from 358 to 217 lbs  

## 2021-08-29 NOTE — Progress Notes (Signed)
Subjective:  Patient ID: Kim Barnes, female    DOB: 1976/05/27  Age: 45 y.o. MRN: [id number]  CC: Annual Exam   HPI Kim Barnes presents for a well exam S/p stomach bypass on 12/06/20 - Dr Lowell Guitar - lost wt from 358 to 217 lbs F/u low B12, low K, Mag  Outpatient Medications Prior to Visit  Medication Sig Dispense Refill   norelgestromin-ethinyl estradiol (ORTHO EVRA) 150-35 MCG/24HR transdermal patch Place 1 patch onto the skin once a week. 12 patch 4   albuterol (PROAIR HFA) 108 (90 Base) MCG/ACT inhaler Inhale 2 puffs into the lungs every 4 (four) hours as needed for wheezing or shortness of breath. 18 g 3   amLODipine (NORVASC) 10 MG tablet Take by mouth. Take 1 by mouth daily     amLODipine (NORVASC) 5 MG tablet Take 1 tablet (5 mg total) by mouth daily. (Patient not taking: Reported on 08/29/2021) 90 tablet 3   ALPRAZolam (NIRAVAM) 0.5 MG dissolvable tablet Take 1 tablet (0.5 mg total) by mouth 2 (two) times daily as needed for anxiety. (Patient not taking: Reported on 08/29/2021) 10 tablet 0   carvedilol (COREG) 25 MG tablet Take 1 tablet (25 mg total) by mouth 2 (two) times daily with a meal. (Patient not taking: Reported on 08/29/2021) 180 tablet 3   famotidine (PEPCID) 20 MG tablet One after supper (Patient not taking: Reported on 08/29/2021) 30 tablet 11   ibuprofen (ADVIL,MOTRIN) 200 MG tablet Take 1,200 mg by mouth daily as needed for moderate pain (tooth pain). (Patient not taking: Reported on 08/29/2021)     metFORMIN (GLUCOPHAGE) 500 MG tablet Take 1 tablet (500 mg total) by mouth daily with breakfast. (Patient not taking: Reported on 08/29/2021) 90 tablet 3   pantoprazole (PROTONIX) 40 MG tablet Take 1 tablet (40 mg total) by mouth daily. Take 30-60 min before first meal of the day (Patient not taking: Reported on 08/29/2021) 30 tablet 2   potassium chloride (KLOR-CON) 10 MEQ tablet Take 1 tablet (10 mEq total) by mouth daily. (Patient not taking: Reported on 08/29/2021) 90  tablet 3   topiramate (TOPAMAX) 25 MG capsule Take 25 mg by mouth 2 (two) times daily. (Patient not taking: Reported on 08/29/2021)     triamterene-hydrochlorothiazide (MAXZIDE-25) 37.5-25 MG tablet Take 1 tablet by mouth daily. Annual appt due in January must see provider for future refills (Patient not taking: Reported on 08/29/2021) 90 tablet 3   Vitamin D, Ergocalciferol, (DRISDOL) 1.25 MG (50000 UT) CAPS capsule TAKE ONE CAPSULE BY MOUTH EVERY 7 DAYS (Patient not taking: Reported on 08/29/2021) 4 capsule 0   No facility-administered medications prior to visit.    ROS: Review of Systems  Constitutional:  Negative for activity change, appetite change, chills, fatigue and unexpected weight change.  HENT:  Negative for congestion, mouth sores and sinus pressure.   Eyes:  Negative for visual disturbance.  Respiratory:  Negative for cough and chest tightness.   Gastrointestinal:  Negative for abdominal pain and nausea.  Genitourinary:  Negative for difficulty urinating, frequency and vaginal pain.  Musculoskeletal:  Negative for back pain and gait problem.  Skin:  Negative for pallor and rash.  Neurological:  Negative for dizziness, tremors, weakness, numbness and headaches.  Psychiatric/Behavioral:  Negative for confusion, sleep disturbance and suicidal ideas.    Objective:  BP (!) 142/92 (BP Location: Left Arm)   Pulse 65   Temp 98.2 F (36.8 C) (Oral)   Ht 5\' 6"  (1.676 m)  Wt 217 lb (98.4 kg)   SpO2 93%   BMI 35.02 kg/m   BP Readings from Last 3 Encounters:  08/29/21 (!) 142/92  03/22/21 (!) 148/99  10/17/20 140/88    Wt Readings from Last 3 Encounters:  08/29/21 217 lb (98.4 kg)  03/21/21 266 lb (120.7 kg)  10/17/20 (!) 358 lb (162.4 kg)    Physical Exam Constitutional:      General: She is not in acute distress.    Appearance: She is well-developed. She is obese.  HENT:     Head: Normocephalic.     Right Ear: External ear normal.     Left Ear: External ear normal.      Nose: Nose normal.  Eyes:     General:        Right eye: No discharge.        Left eye: No discharge.     Conjunctiva/sclera: Conjunctivae normal.     Pupils: Pupils are equal, round, and reactive to light.  Neck:     Thyroid: No thyromegaly.     Vascular: No JVD.     Trachea: No tracheal deviation.  Cardiovascular:     Rate and Rhythm: Normal rate and regular rhythm.     Heart sounds: Normal heart sounds.  Pulmonary:     Effort: No respiratory distress.     Breath sounds: No stridor. No wheezing.  Abdominal:     General: Bowel sounds are normal. There is no distension.     Palpations: Abdomen is soft. There is no mass.     Tenderness: There is no abdominal tenderness. There is no guarding or rebound.  Musculoskeletal:        General: No tenderness.     Cervical back: Normal range of motion and neck supple.  Lymphadenopathy:     Cervical: No cervical adenopathy.  Skin:    Findings: No erythema or rash.  Neurological:     Mental Status: She is oriented to person, place, and time.     Cranial Nerves: No cranial nerve deficit.     Motor: No abnormal muscle tone.     Coordination: Coordination normal.     Deep Tendon Reflexes: Reflexes normal.  Psychiatric:        Behavior: Behavior normal.        Thought Content: Thought content normal.        Judgment: Judgment normal.   I spent 22 minutes in addition to time for CPX wellness examination in preparing to see the patient by review of recent labs, imaging and procedures, obtaining and reviewing separately obtained history, communicating with the patient, ordering medications, tests or procedures, and documenting clinical information in the EHR including the differential diagnosis, treatment, and any further evaluation and other management of s/p gastric bypass, low K, low Mag        Lab Results  Component Value Date   WBC 6.5 03/21/2021   HGB 12.7 03/21/2021   HCT 38.5 03/21/2021   PLT 267 03/21/2021   GLUCOSE 83  03/21/2021   CHOL 210 (H) 01/05/2020   TRIG 86.0 01/05/2020   HDL 52.90 01/05/2020   LDLDIRECT 149.4 05/24/2010   LDLCALC 139 (H) 01/05/2020   ALT 20 03/21/2021   AST 24 03/21/2021   NA 136 03/21/2021   K 2.5 (LL) 03/21/2021   CL 93 (L) 03/21/2021   CREATININE 1.00 03/21/2021   BUN 9 03/21/2021   CO2 26 03/21/2021   TSH 2.13 01/05/2020   INR 1.16 02/04/2017  HGBA1C 6.0 01/05/2020    No results found.  Assessment & Plan:   Problem List Items Addressed This Visit   None     Follow-up: No follow-ups on file.  Sonda Primes, MD

## 2021-08-30 ENCOUNTER — Other Ambulatory Visit: Payer: Self-pay | Admitting: Internal Medicine

## 2021-08-30 DIAGNOSIS — E538 Deficiency of other specified B group vitamins: Secondary | ICD-10-CM

## 2021-08-30 DIAGNOSIS — D509 Iron deficiency anemia, unspecified: Secondary | ICD-10-CM | POA: Insufficient documentation

## 2021-08-30 DIAGNOSIS — E559 Vitamin D deficiency, unspecified: Secondary | ICD-10-CM

## 2021-08-30 DIAGNOSIS — E876 Hypokalemia: Secondary | ICD-10-CM | POA: Insufficient documentation

## 2021-08-30 DIAGNOSIS — D508 Other iron deficiency anemias: Secondary | ICD-10-CM

## 2021-08-30 LAB — URINALYSIS, ROUTINE W REFLEX MICROSCOPIC
Bilirubin Urine: NEGATIVE
Ketones, ur: NEGATIVE
Leukocytes,Ua: NEGATIVE
Nitrite: NEGATIVE
Specific Gravity, Urine: 1.02 (ref 1.000–1.030)
Total Protein, Urine: 30 — AB
Urine Glucose: NEGATIVE
Urobilinogen, UA: 1 — AB (ref 0.0–1.0)
pH: 6.5 (ref 5.0–8.0)

## 2021-08-30 LAB — IRON,TIBC AND FERRITIN PANEL
%SAT: 15 % (calc) — ABNORMAL LOW (ref 16–45)
Ferritin: 114 ng/mL (ref 16–232)
Iron: 41 ug/dL (ref 40–190)
TIBC: 275 mcg/dL (calc) (ref 250–450)

## 2021-08-30 MED ORDER — B COMPLEX PLUS PO TABS
1.0000 | ORAL_TABLET | Freq: Every day | ORAL | 3 refills | Status: AC
Start: 2021-08-30 — End: ?

## 2021-08-30 MED ORDER — FERROUS SULFATE 325 (65 FE) MG PO TABS: 325.0000 mg | ORAL_TABLET | Freq: Every day | ORAL | 1 refills | Status: DC

## 2021-08-30 MED ORDER — VITAMIN D (ERGOCALCIFEROL) 1.25 MG (50000 UNIT) PO CAPS: 50000.0000 [IU] | ORAL_CAPSULE | ORAL | 0 refills | Status: DC

## 2021-08-30 MED ORDER — MAGNESIUM OXIDE 400 MG PO CAPS: ORAL_CAPSULE | ORAL | 0 refills | Status: DC

## 2021-08-30 MED ORDER — VITAMIN D3 50 MCG (2000 UT) PO CAPS: 2000.0000 [IU] | ORAL_CAPSULE | Freq: Every day | ORAL | 3 refills | Status: DC

## 2021-08-30 NOTE — Progress Notes (Signed)
x

## 2021-08-30 NOTE — Assessment & Plan Note (Signed)
S/p gastric bypass Start KCL, Mag

## 2021-08-30 NOTE — Assessment & Plan Note (Signed)
S/p gastric bypass Start B complex

## 2021-08-30 NOTE — Assessment & Plan Note (Signed)
S/p gastric bypass Start Vit D prescription 50000 iu weekly (Rx emailed to your pharmacy) followed by over-the-counter Vit D 2000 iu daily.

## 2021-08-30 NOTE — Assessment & Plan Note (Signed)
S/p gastric bypass Start PO FeSO4

## 2021-10-11 ENCOUNTER — Other Ambulatory Visit (HOSPITAL_BASED_OUTPATIENT_CLINIC_OR_DEPARTMENT_OTHER): Payer: Self-pay

## 2021-10-11 ENCOUNTER — Ambulatory Visit: Payer: 59 | Attending: Internal Medicine

## 2021-10-11 DIAGNOSIS — Z23 Encounter for immunization: Secondary | ICD-10-CM

## 2021-10-11 MED ORDER — PFIZER COVID-19 VAC BIVALENT 30 MCG/0.3ML IM SUSP
INTRAMUSCULAR | 0 refills | Status: DC
  Filled 2021-10-11: qty 0.3, 1d supply, fill #0

## 2021-10-11 NOTE — Progress Notes (Signed)
   Covid-19 Vaccination Clinic  Name:  Kim Barnes    MRN: [id number] DOB: Apr 23, 1976  10/11/2021  [name] was observed post Covid-19 immunization for 15 minutes without incident. She was provided with Vaccine Information Sheet and instruction to access the V-Safe system.   [name] was instructed to call 911 with any severe reactions post vaccine: Difficulty breathing  Swelling of face and throat  A fast heartbeat  A bad rash all over body  Dizziness and weakness   Immunizations Administered     Name Date Dose VIS Date Route   Pfizer Covid-19 Vaccine Bivalent Booster 10/11/2021 11:34 AM 0.3 mL 08/09/2021 Intramuscular   Manufacturer: [employer], [employer]   Lot: [id number]   NDC: [phone]

## 2021-10-18 ENCOUNTER — Encounter: Payer: Self-pay | Admitting: Obstetrics & Gynecology

## 2021-10-18 ENCOUNTER — Other Ambulatory Visit: Payer: Self-pay

## 2021-10-18 ENCOUNTER — Ambulatory Visit (INDEPENDENT_AMBULATORY_CARE_PROVIDER_SITE_OTHER): Payer: 59 | Admitting: Obstetrics & Gynecology

## 2021-10-18 VITALS — BP 118/74 | HR 70 | Resp 16 | Ht 65.75 in | Wt 217.0 lb

## 2021-10-18 DIAGNOSIS — Z6835 Body mass index (BMI) 35.0-35.9, adult: Secondary | ICD-10-CM

## 2021-10-18 DIAGNOSIS — Z3045 Encounter for surveillance of transdermal patch hormonal contraceptive device: Secondary | ICD-10-CM

## 2021-10-18 DIAGNOSIS — Z01419 Encounter for gynecological examination (general) (routine) without abnormal findings: Secondary | ICD-10-CM

## 2021-10-18 DIAGNOSIS — E6609 Other obesity due to excess calories: Secondary | ICD-10-CM | POA: Diagnosis not present

## 2021-10-18 MED ORDER — NORELGESTROMIN-ETH ESTRADIOL 150-35 MCG/24HR TD PTWK: 1.0000 | MEDICATED_PATCH | TRANSDERMAL | 4 refills | Status: DC

## 2021-10-18 NOTE — Progress Notes (Signed)
Kim Barnes 06-Feb-1976 [id number]   History:    45 y.o. G2P0A2 Stable boyfriend x 9 yrs   RP:  Established patient presenting for annual gyn exam    HPI: Well on Ortho Evra patch.  No BTB.  No pelvic pain.  Pap Neg 10/2019.  Breasts normal.  Bilateral Dx  Mammo/Rt Breast US 09/2020: Probably Benign.  Scheduled for Mammo 12/2021.  BMI decreased to 35.29 post Bariatric surgery 12/06/2020. Health labs with family physician.   Past medical history,surgical history, family history and social history were all reviewed and documented in the EPIC chart.  Gynecologic History Patient's last menstrual period was 09/27/2021 (exact date).  Obstetric History OB History  Gravida Para Term Preterm AB Living  2       2 0  SAB IAB Ectopic Multiple Live Births  1 1     0    # Outcome Date GA Lbr Len/2nd Weight Sex Delivery Anes PTL Lv  2 SAB           1 IAB              ROS: A ROS was performed and pertinent positives and negatives are included in the history.  GENERAL: No fevers or chills. HEENT: No change in vision, no earache, sore throat or sinus congestion. NECK: No pain or stiffness. CARDIOVASCULAR: No chest pain or pressure. No palpitations. PULMONARY: No shortness of breath, cough or wheeze. GASTROINTESTINAL: No abdominal pain, nausea, vomiting or diarrhea, melena or bright red blood per rectum. GENITOURINARY: No urinary frequency, urgency, hesitancy or dysuria. MUSCULOSKELETAL: No joint or muscle pain, no back pain, no recent trauma. DERMATOLOGIC: No rash, no itching, no lesions. ENDOCRINE: No polyuria, polydipsia, no heat or cold intolerance. No recent change in weight. HEMATOLOGICAL: No anemia or easy bruising or bleeding. NEUROLOGIC: No headache, seizures, numbness, tingling or weakness. PSYCHIATRIC: No depression, no loss of interest in normal activity or change in sleep pattern.     Exam:   BP 118/74   Pulse 70   Resp 16   Ht 5' 5.75" (1.67 m)   Wt 217 lb (98.4 kg)   LMP  09/27/2021 (Exact Date) Comment: ortho evra  BMI 35.29 kg/m   Body mass index is 35.29 kg/m.  General appearance : Well developed well nourished female. No acute distress HEENT: Eyes: no retinal hemorrhage or exudates,  Neck supple, trachea midline, no carotid bruits, no thyroidmegaly Lungs: Clear to auscultation, no rhonchi or wheezes, or rib retractions  Heart: Regular rate and rhythm, no murmurs or gallops Breast:Examined in sitting and supine position were symmetrical in appearance, no palpable masses or tenderness,  no skin retraction, no nipple inversion, no nipple discharge, no skin discoloration, no axillary or supraclavicular lymphadenopathy Abdomen: no palpable masses or tenderness, no rebound or guarding Extremities: no edema or skin discoloration or tenderness  Pelvic: Vulva: Normal             Vagina: No gross lesions or discharge  Cervix: No gross lesions or discharge  Uterus  AV, normal size, shape and consistency, non-tender and mobile  Adnexa  Without masses or tenderness  Anus: Normal   Assessment/Plan:  45 y.o. female for annual exam   1. Well female exam with routine gynecological exam Well on Ortho Evra patch.  No BTB.  No pelvic pain.  Pap Neg 10/2019.  Will repeat at 3 yrs, next year.  Breasts normal.  Bilateral Dx  Mammo/Rt Breast US 09/2020: Probably Benign.  Scheduled  for [facility] 12/2021.  BMI decreased to 35.29 post Bariatric surgery 12/06/2020. Health labs with family physician.  2. Encounter for surveillance of transdermal patch hormonal contraceptive device Well on OrthoEvra patch.  No CI to continue.  Prescription sent to pharmacy.  3. Class 2 obesity due to excess calories without serious comorbidity with body mass index (BMI) of 35.0 to 35.9 in adult Excellent weight loss post Bariatric surgery in 11/2020.  Continue with low calorie/carb diet and regular fitness activities.  Other orders - norelgestromin-ethinyl estradiol (ORTHO EVRA) 150-35 MCG/24HR  transdermal patch; Place 1 patch onto the skin once a week.   [name] MD, 8:48 AM 10/18/2021

## 2021-11-28 ENCOUNTER — Ambulatory Visit: Payer: 59 | Admitting: Internal Medicine

## 2022-01-29 IMAGING — US A
1 series · 9 of 9 positions shown · non-contrast
Comparison: Previous exam(s).

CLINICAL DATA: Follow-up 2 probably benign right breast masses.

EXAM:
DIGITAL DIAGNOSTIC BILATERAL MAMMOGRAM WITH CAD AND TOMO
ULTRASOUND RIGHT BREAST

[Series 1: a · 0.07mm/px · 9 of 9 slices shown]
[im 1/9]
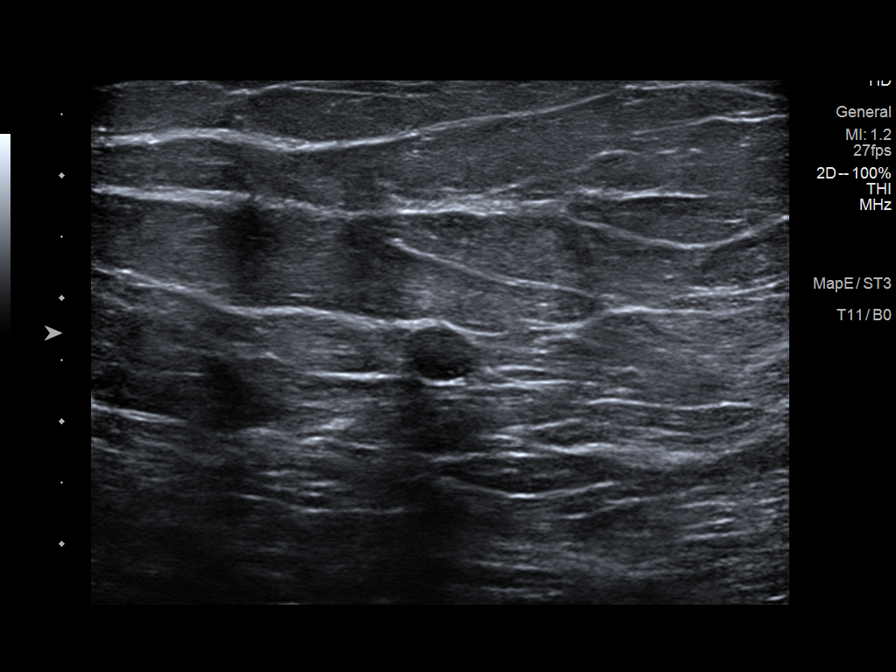
[im 2/9]
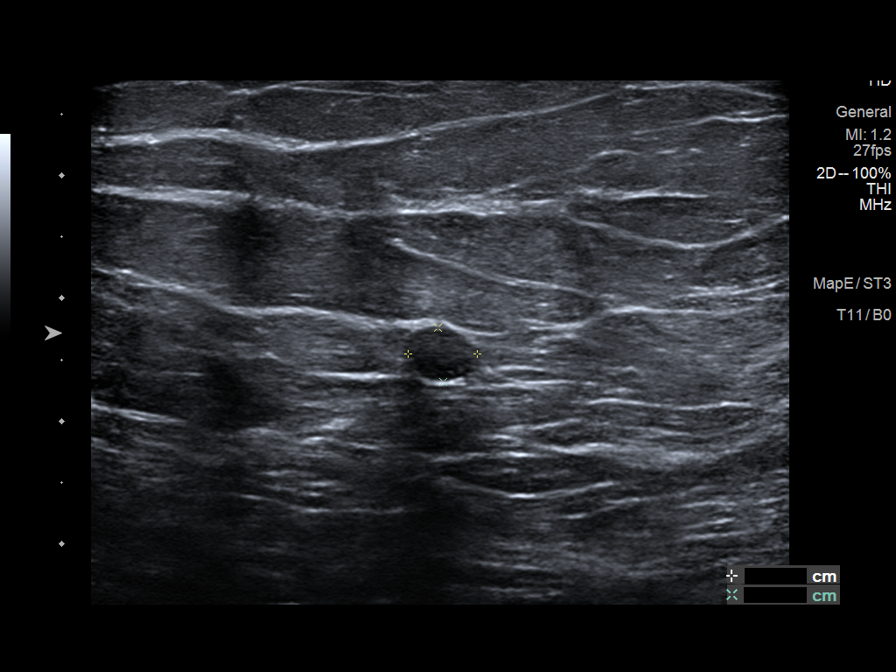
[im 3/9]
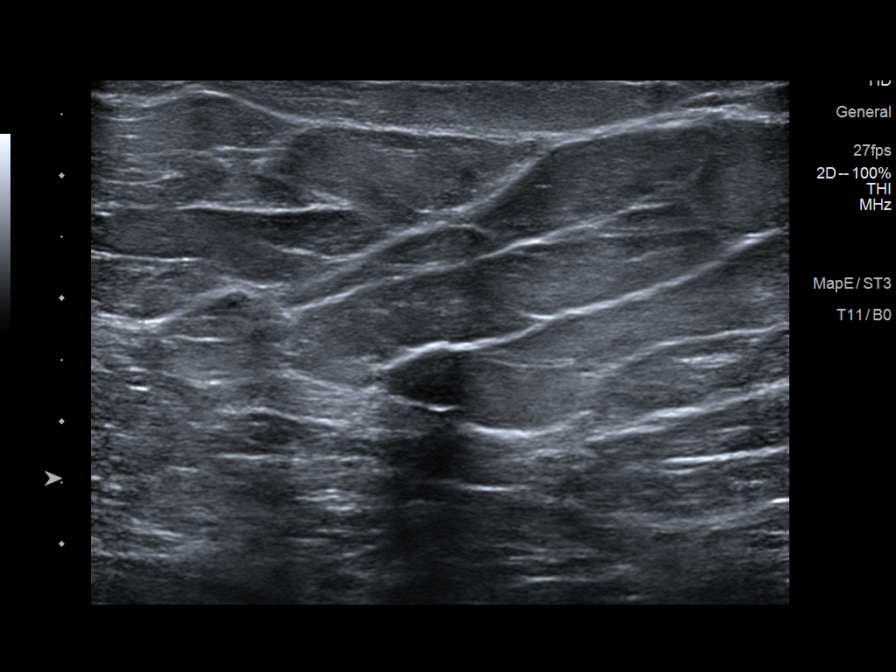
[im 4/9]
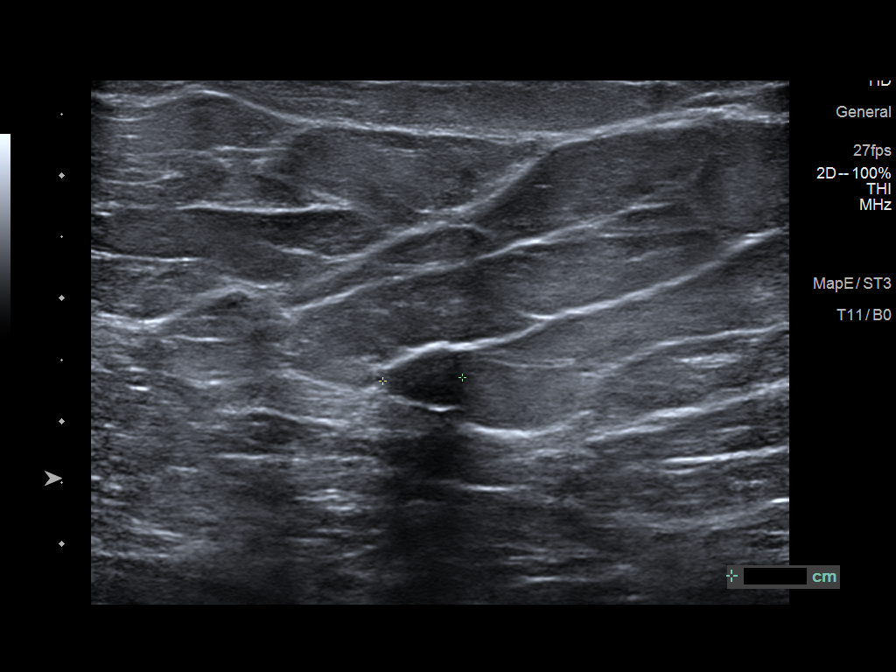
[im 5/9]
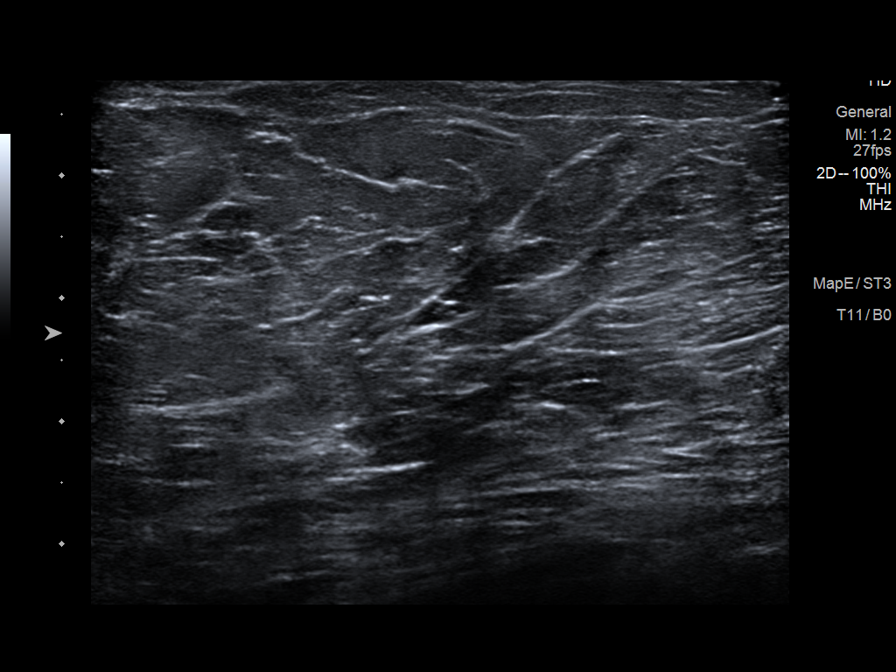
[im 6/9]
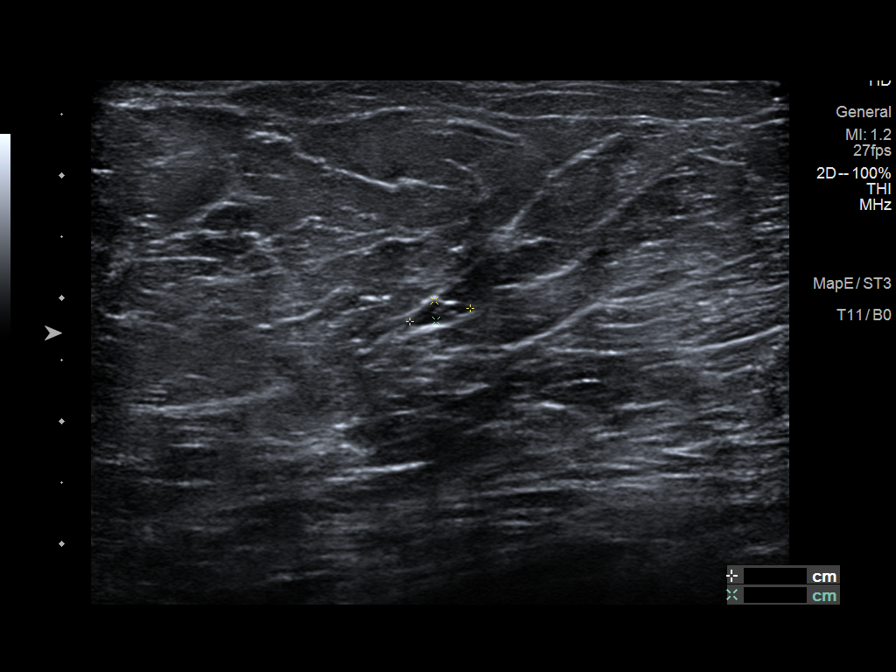
[im 7/9]
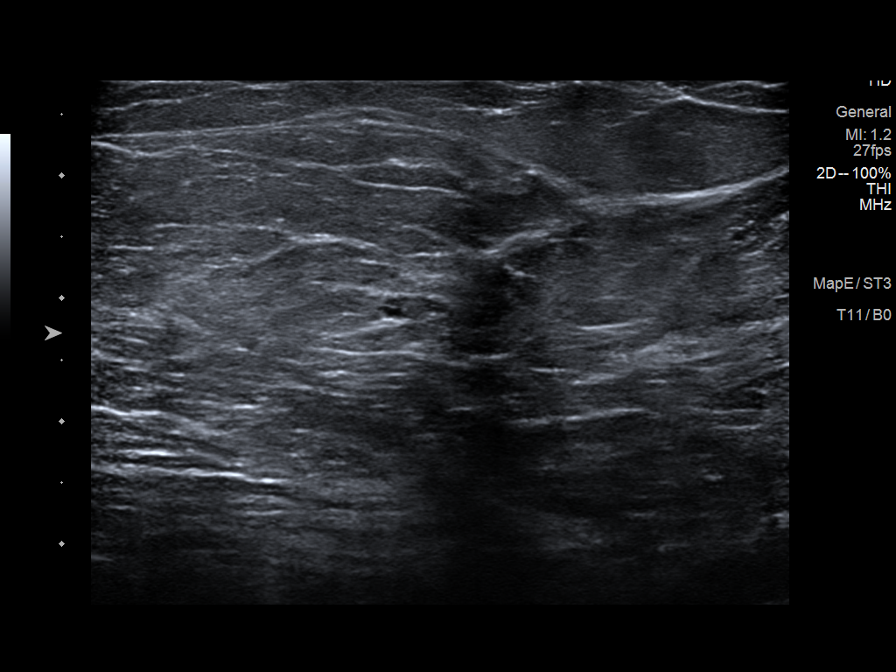
[im 8/9]
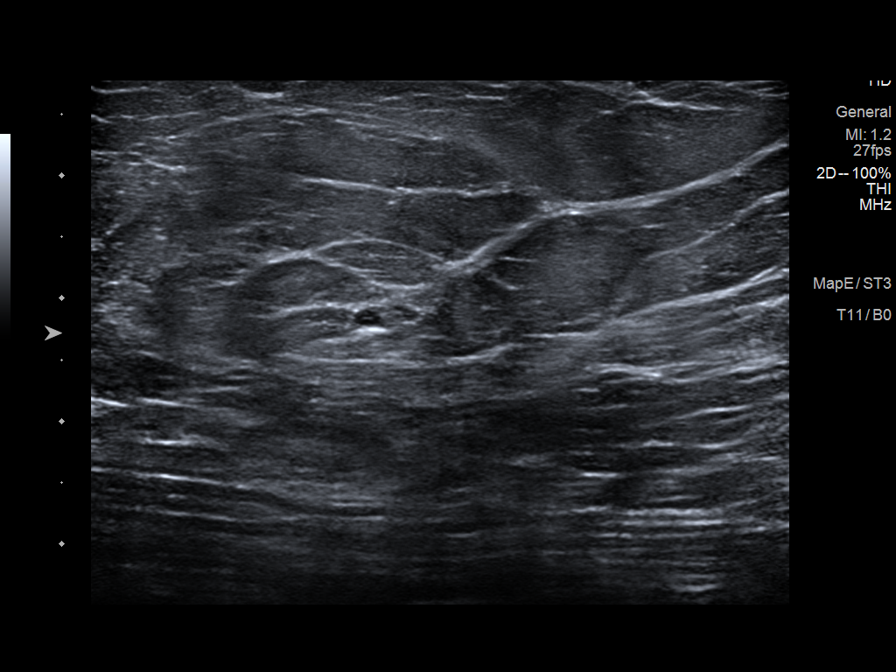
[im 9/9]
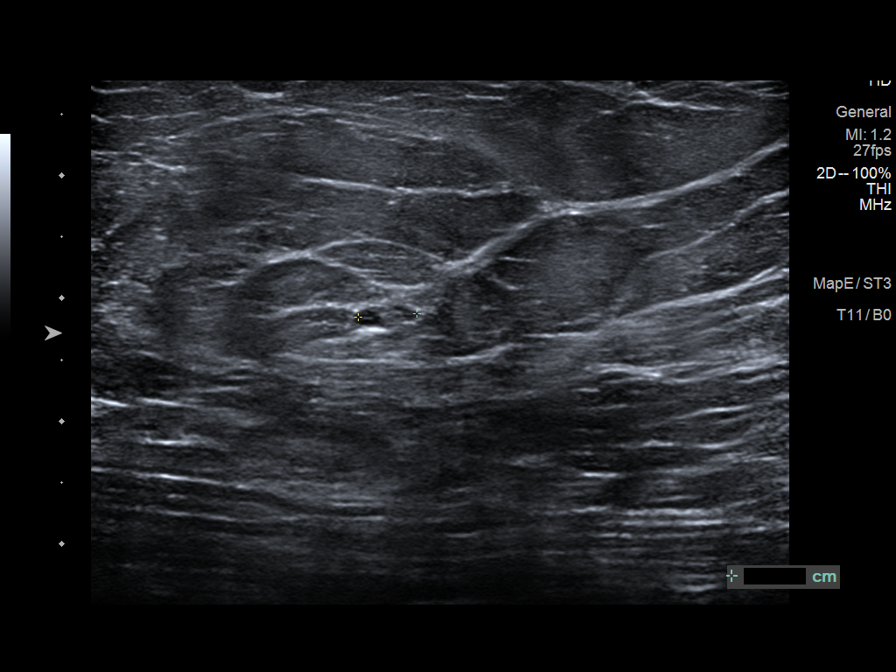

[9 of 9 positions shown; findings below may reference images not displayed]

ACR Breast Density Category b: There are scattered areas of
fibroglandular density.
FINDINGS: The previously evaluated 2 small, oval, circumscribed masses in the
right breast have not changed since 04/04/2020 and 09/29/2019. The
previously evaluated small spacious cyst in the left breast is
unchanged. No interval findings suspicious for malignancy in either
breast.

Mammographic images were processed with CAD.

Targeted ultrasound is performed, showing a 7 x 6 x 4 mm oval,
circumscribed, horizontally oriented hypoechoic mass in the 11:30
o'clock position of the right breast, 9 cm from the nipple. This
measured 7 x 6 x 4 mm on the initial ultrasound dated 10/02/2019.

A 5 x 5 x 2 mm oval, horizontally oriented, circumscribed,
hypoechoic mass containing thin partial internal septations and
central increased echogenicity is demonstrated in the 12:30 o'clock
position of the right breast, 9 cm from the nipple. This measured 5
x 4 x 2 mm on the initial ultrasound dated 10/02/2019.
IMPRESSION: 1. No significant change in 2 small, probably benign masses in the
right breast.
2. No evidence of malignancy elsewhere in either breast.

RECOMMENDATION:
Bilateral diagnostic mammogram and right breast ultrasound in 1 year
to complete 2 years of follow-up of the probably benign masses on
the right.

I have discussed the findings and recommendations with the patient.
If applicable, a reminder letter will be sent to the patient
regarding the next appointment.

BI-RADS CATEGORY  3: Probably benign.

## 2022-01-29 IMAGING — MG DIGITAL DIAGNOSTIC BILAT W/ TOMO W/ CAD
6 of 12 series · 6 of 36 positions shown · non-contrast
Comparison: Previous exam(s).

CLINICAL DATA: Follow-up 2 probably benign right breast masses.

EXAM:
DIGITAL DIAGNOSTIC BILATERAL MAMMOGRAM WITH CAD AND TOMO
ULTRASOUND RIGHT BREAST

[R MLO synth-2D (1 of 2)]
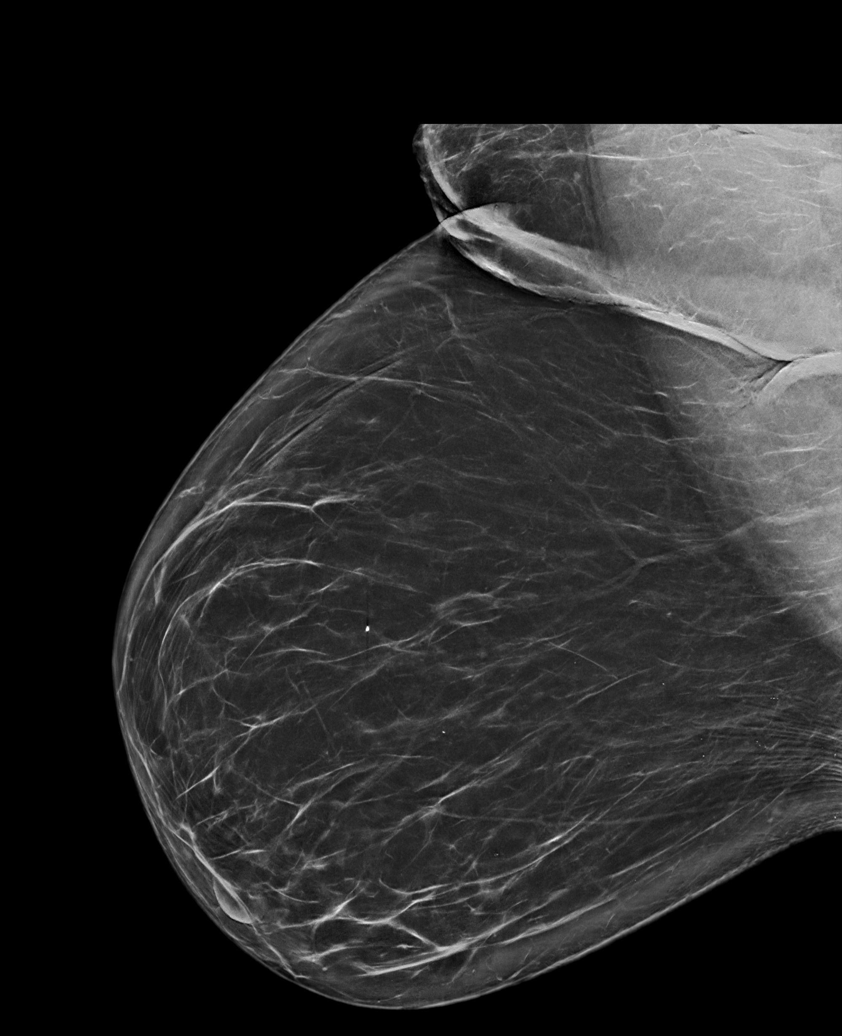

[L CC synth-2D]
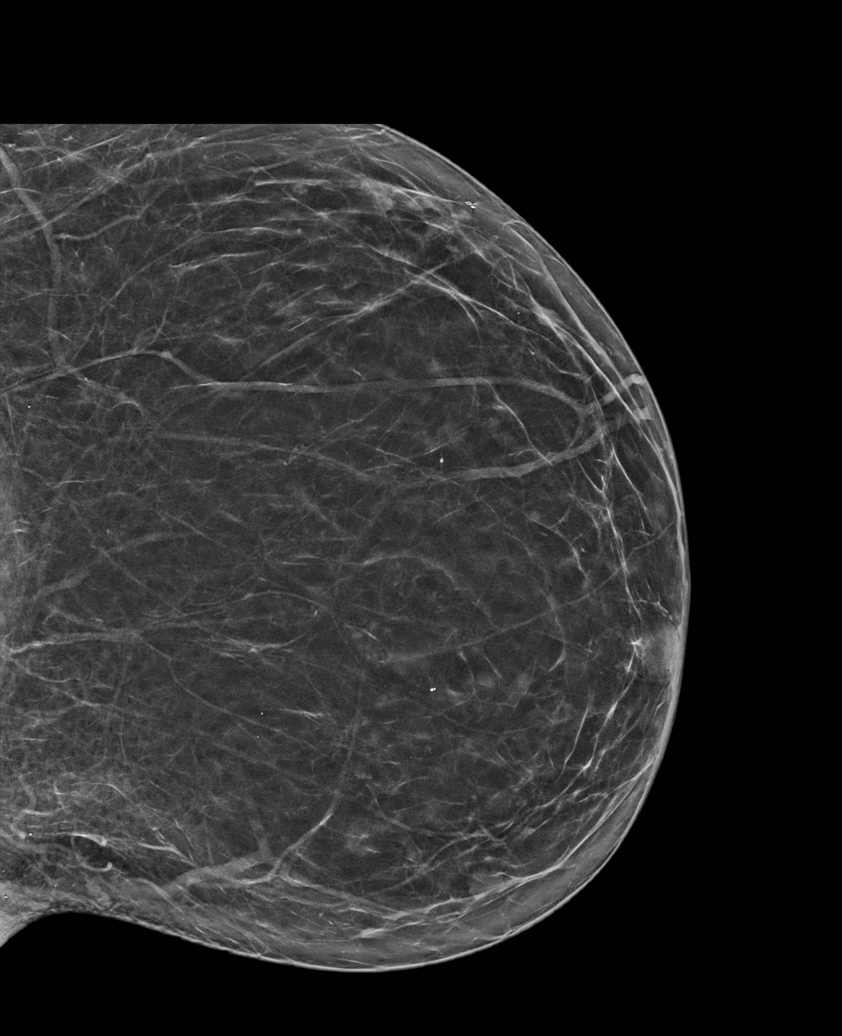

[L MLO synth-2D (1 of 2)]
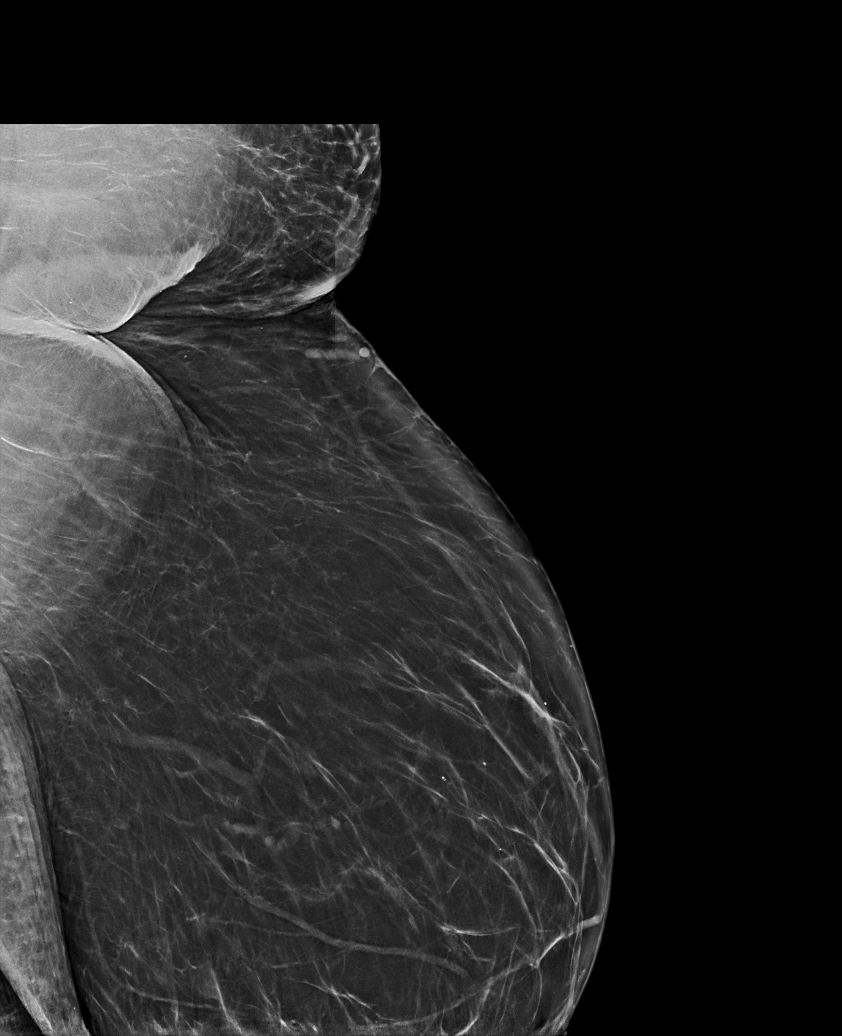

[R MLO synth-2D (2 of 2)]
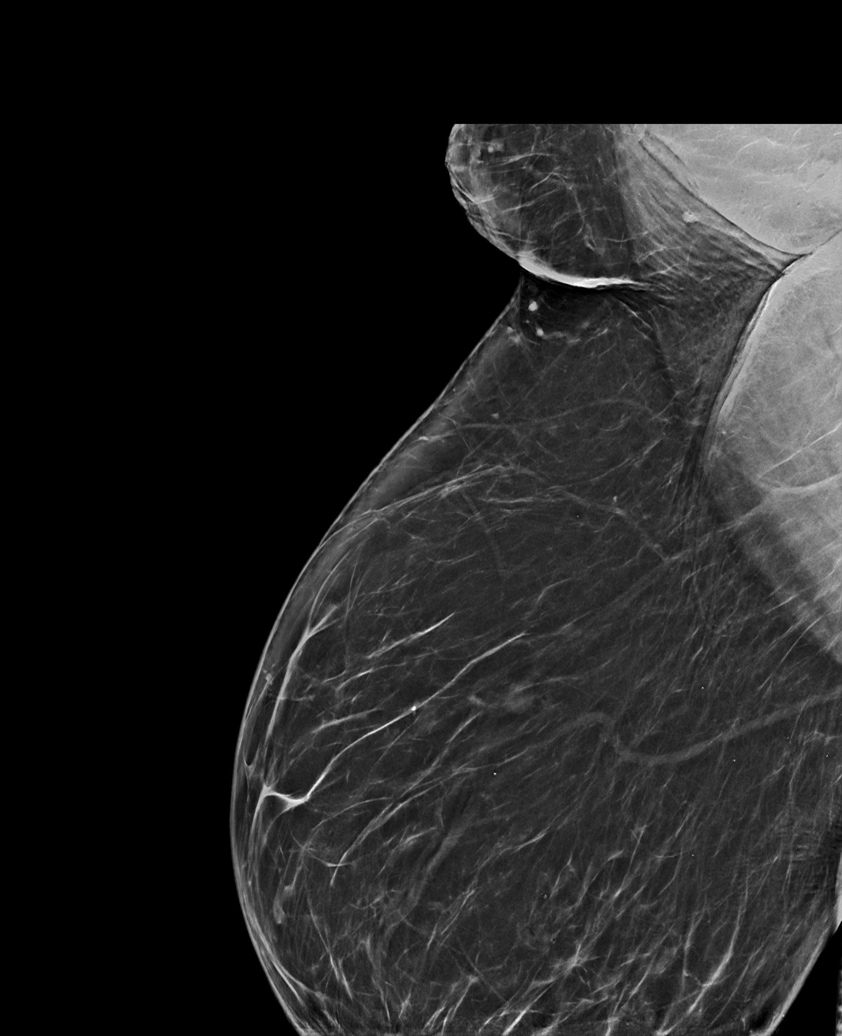

[R CC synth-2D]
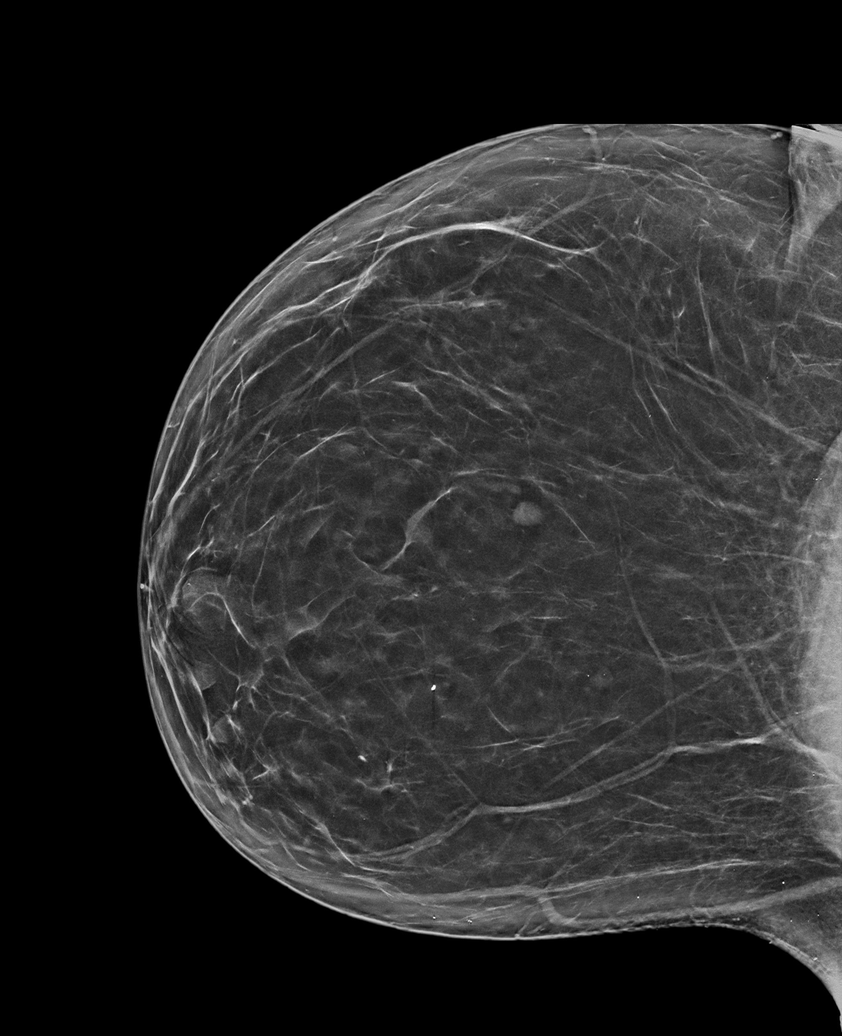

[L MLO synth-2D (2 of 2)]
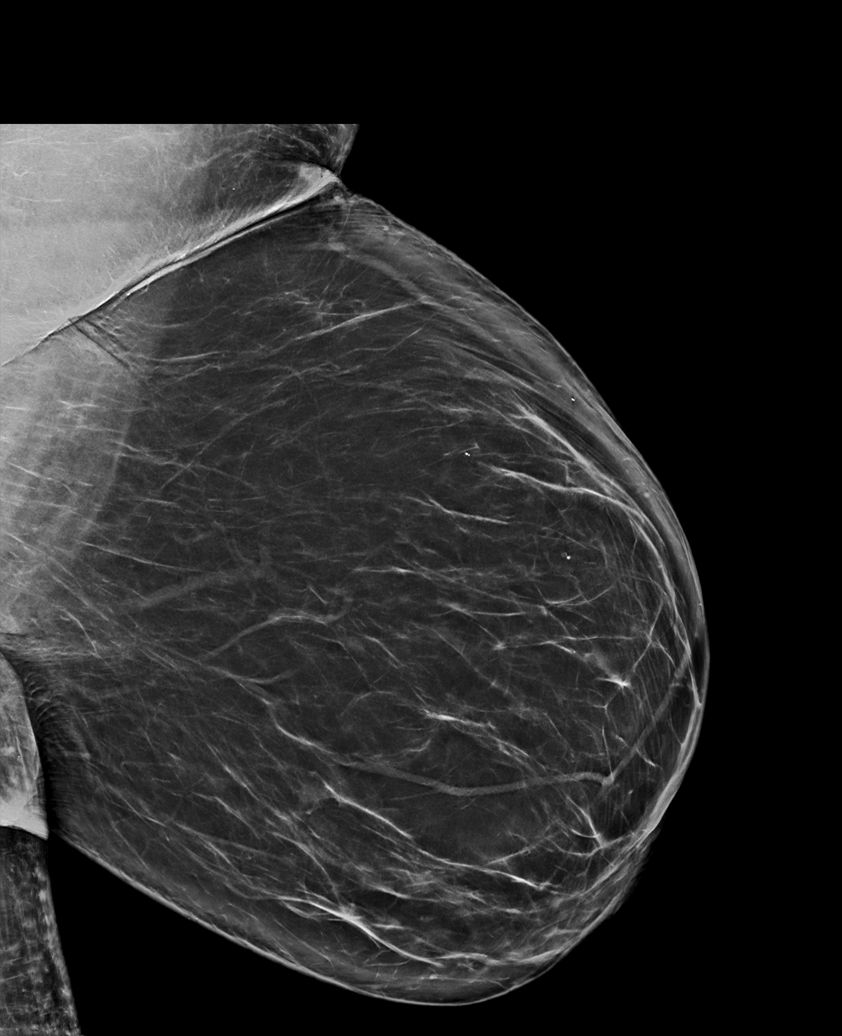

[6 of 36 positions shown; findings below may reference images not displayed]

ACR Breast Density Category b: There are scattered areas of
fibroglandular density.
FINDINGS: The previously evaluated 2 small, oval, circumscribed masses in the
right breast have not changed since 04/04/2020 and 09/29/2019. The
previously evaluated small spacious cyst in the left breast is
unchanged. No interval findings suspicious for malignancy in either
breast.

Mammographic images were processed with CAD.

Targeted ultrasound is performed, showing a 7 x 6 x 4 mm oval,
circumscribed, horizontally oriented hypoechoic mass in the 11:30
o'clock position of the right breast, 9 cm from the nipple. This
measured 7 x 6 x 4 mm on the initial ultrasound dated 10/02/2019.

A 5 x 5 x 2 mm oval, horizontally oriented, circumscribed,
hypoechoic mass containing thin partial internal septations and
central increased echogenicity is demonstrated in the 12:30 o'clock
position of the right breast, 9 cm from the nipple. This measured 5
x 4 x 2 mm on the initial ultrasound dated 10/02/2019.
IMPRESSION: 1. No significant change in 2 small, probably benign masses in the
right breast.
2. No evidence of malignancy elsewhere in either breast.

RECOMMENDATION:
Bilateral diagnostic mammogram and right breast ultrasound in 1 year
to complete 2 years of follow-up of the probably benign masses on
the right.

I have discussed the findings and recommendations with the patient.
If applicable, a reminder letter will be sent to the patient
regarding the next appointment.

BI-RADS CATEGORY  3: Probably benign.

## 2022-02-28 ENCOUNTER — Ambulatory Visit: Payer: 59 | Admitting: Internal Medicine

## 2022-03-14 ENCOUNTER — Encounter: Payer: Self-pay | Admitting: Internal Medicine

## 2022-03-21 ENCOUNTER — Encounter: Payer: Self-pay | Admitting: Family Medicine

## 2022-03-21 ENCOUNTER — Ambulatory Visit (INDEPENDENT_AMBULATORY_CARE_PROVIDER_SITE_OTHER): Payer: 59 | Admitting: Family Medicine

## 2022-03-21 VITALS — BP 170/100 | HR 74 | Temp 98.2°F | Ht 67.5 in | Wt 223.0 lb

## 2022-03-21 DIAGNOSIS — R21 Rash and other nonspecific skin eruption: Secondary | ICD-10-CM | POA: Diagnosis not present

## 2022-03-21 MED ORDER — TRIAMCINOLONE ACETONIDE 0.1 % EX CREA: 1.0000 "application " | TOPICAL_CREAM | Freq: Two times a day (BID) | CUTANEOUS | 0 refills | Status: DC

## 2022-03-21 NOTE — Progress Notes (Signed)
? ?Acute Office Visit ? ?Subjective:  ? ? Patient ID: Kim Barnes, female    DOB: [birth year], 46 y.o.   MRN: [id number] ? ?Chief Complaint  ?Patient presents with  ? Rash  ?  Right arm, started last Tuesday. Was hurting but Monday that stopped and now is itchy  ? ? ?HPI ?Patient is in today for a localized pruritic rash on the back of her right upper arm x 1 week. States the rash was tender initially and more pruritic at onset. Symptoms improving. No fever, chills, arthralgias, myalgias, N/V/D.  ?No other symptoms. No known insect bite or allergic reaction.  ? ? ?Past Medical History:  ?Diagnosis Date  ? Anxiety   ? Asthma   ? prn inhaler  ? Back pain   ? Bell's palsy   ? Carpal tunnel syndrome of left wrist 01/2013  ? Chest pain   ? Cold hands and feet   ? Cough   ? Depression   ? Dry mouth   ? Dry skin   ? Fatigue   ? GERD (gastroesophageal reflux disease)   ? occasional; no current med.  ? History of brain tumor 1996  ? no neurologic deficits  ? HTN (hypertension)   ? under control with med., has been on med. x 5 yr.  ? Joint pain   ? Obesity   ? Obstructive sleep apnea   ? PCOS (polycystic ovarian syndrome)   ? Polycystic ovarian syndrome   ? no current med.; irregular periods  ? Shortness of breath   ? Stress   ? Swelling of extremity   ? Wheezing   ? ? ?Past Surgical History:  ?Procedure Laterality Date  ? BRAIN SURGERY  1996  ? exc. tumor  ? CARPAL TUNNEL RELEASE Left 02/10/2013  ? Procedure: CARPAL TUNNEL RELEASE;  Surgeon: Nicki Reaper, MD;  Location: [facility];  Service: Orthopedics;  Laterality: Left;  ANESTHESIA: IV REGIONAL FAB  ? HYSTEROSCOPY WITH D & C  02/21/2006  ? HYSTEROSCOPY WITH D & C  12/17/2011  ? Procedure: DILATATION AND CURETTAGE /HYSTEROSCOPY;  Surgeon: Meriel Pica, MD;  Location: [facility];  Service: Gynecology;  Laterality: N/A;  ? LEEP  01/07/2004  ? TONSILLECTOMY  09/21/2002  ? ? ?Family History  ?Problem Relation Age of Onset  ? Hypertension Other   ? Lung cancer Father    ?     Smoker  ? Cancer Father   ?     lung/ smoker  ? Heart disease Father   ? Alcoholism Father   ? Breast cancer Maternal Grandmother   ? Cancer Maternal Grandmother   ?     breast  ? Emphysema Paternal Grandmother   ? Asthma Mother   ? Hyperlipidemia Mother   ? Hypertension Mother   ? Sleep apnea Mother   ? Asthma Maternal Aunt   ? Cancer Maternal Aunt   ?     liver  ? Cancer Paternal Uncle   ? Cancer Maternal Uncle   ?     prostate  ? ? ?Social History  ? ?Socioeconomic History  ? Marital status: Single  ?  Spouse name: Not on file  ? Number of children: 0  ? Years of education: Not on file  ? Highest education level: Not on file  ?Occupational History  ? Occupation: Patient Acc Rep  ?Tobacco Use  ? Smoking status: Never  ? Smokeless tobacco: Never  ?Vaping Use  ? Vaping Use: Never  used  ?Substance and Sexual Activity  ? Alcohol use: Not Currently  ? Drug use: No  ? Sexual activity: Yes  ?  Partners: Male  ?  Birth control/protection: Patch  ?  Comment: 1st intercourse- 16, partners- 9, current partner- 11 yrs   ?Other Topics Concern  ? Not on file  ?Social History Narrative  ? Not on file  ? ?Social Determinants of Health  ? ?Financial Resource Strain: Not on file  ?Food Insecurity: Not on file  ?Transportation Needs: Not on file  ?Physical Activity: Not on file  ?Stress: Not on file  ?Social Connections: Not on file  ?Intimate Partner Violence: Not on file  ? ? ?Outpatient Medications Prior to Visit  ?Medication Sig Dispense Refill  ? amLODipine (NORVASC) 5 MG tablet Take 1 tablet (5 mg total) by mouth daily. 30 tablet 11  ? B Complex-Folic Acid (B COMPLEX PLUS) TABS Take 1 tablet by mouth daily. 100 tablet 3  ? Cholecalciferol (VITAMIN D3) 50 MCG (2000 UT) capsule Take 1 capsule (2,000 Units total) by mouth daily. 100 capsule 3  ? ferrous sulfate 325 (65 FE) MG tablet Take 1 tablet (325 mg total) by mouth daily. 90 tablet 1  ? Magnesium Oxide 400 MG CAPS 1 po qd 90 capsule 0  ? norelgestromin-ethinyl  estradiol (ORTHO EVRA) 150-35 MCG/24HR transdermal patch Place 1 patch onto the skin once a week. 12 patch 4  ? Vitamin D, Ergocalciferol, (DRISDOL) 1.25 MG (50000 UNIT) CAPS capsule Take 1 capsule (50,000 Units total) by mouth every 7 (seven) days. 8 capsule 0  ? albuterol (PROAIR HFA) 108 (90 Base) MCG/ACT inhaler Inhale 2 puffs into the lungs every 4 (four) hours as needed for wheezing or shortness of breath. 18 g 3  ? ?No facility-administered medications prior to visit.  ? ? ?No Known Allergies ? ?Review of Systems ?Pertinent positives and negatives in the history of present illness. ? ?   ?Objective:  ?  ?Physical Exam ? ?BP (!) 170/100 (BP Location: Right Arm, Patient Position: Sitting, Cuff Size: Large) Comment: didnt take BP meds  Pulse 74   Temp 98.2 ?F (36.8 ?C) (Temporal)   Ht 5' 7.5" (1.715 m)   Wt 223 lb (101.2 kg)   SpO2 98%   BMI 34.41 kg/m?  ?Wt Readings from Last 3 Encounters:  ?03/21/22 223 lb (101.2 kg)  ?10/18/21 217 lb (98.4 kg)  ?08/29/21 217 lb (98.4 kg)  ? ? ? ? ?Patch of raised papules with erythematous based, some scabbing. No surrounding erythema, edema, fluctuance or drainage.  ? ? ? ?Health Maintenance Due  ?Topic Date Due  ? HIV Screening  Never done  ? Hepatitis C Screening  Never done  ? ? ?There are no preventive care reminders to display for this patient. ? ? ?Lab Results  ?Component Value Date  ? TSH 1.95 08/29/2021  ? ?Lab Results  ?Component Value Date  ? WBC 4.6 08/29/2021  ? HGB 10.8 (L) 08/29/2021  ? HCT 32.6 (L) 08/29/2021  ? MCV 91.6 08/29/2021  ? PLT 316.0 08/29/2021  ? ?Lab Results  ?Component Value Date  ? NA 142 08/29/2021  ? K 3.3 (L) 08/29/2021  ? CO2 31 08/29/2021  ? GLUCOSE 68 (L) 08/29/2021  ? BUN 10 08/29/2021  ? CREATININE 0.76 08/29/2021  ? BILITOT 0.4 08/29/2021  ? ALKPHOS 60 08/29/2021  ? AST 19 08/29/2021  ? ALT 14 08/29/2021  ? PROT 7.1 08/29/2021  ? ALBUMIN 3.6 08/29/2021  ? CALCIUM  9.3 08/29/2021  ? ANIONGAP 17 (H) 03/21/2021  ? GFR 96.11 08/29/2021   ? ?Lab Results  ?Component Value Date  ? CHOL 188 08/29/2021  ? ?Lab Results  ?Component Value Date  ? HDL 54.40 08/29/2021  ? ?Lab Results  ?Component Value Date  ? LDLCALC 119 (H) 08/29/2021  ? ?Lab Results  ?Component Value Date  ? TRIG 75.0 08/29/2021  ? ?Lab Results  ?Component Value Date  ? CHOLHDL 3 08/29/2021  ? ?Lab Results  ?Component Value Date  ? HGBA1C 4.9 08/29/2021  ? ? ?   ?Assessment & Plan:  ? ?Problem List Items Addressed This Visit   ?None ?Visit Diagnoses   ? ? Rash and nonspecific skin eruption    -  Primary  ? Relevant Medications  ? triamcinolone cream (KENALOG) 0.1 %  ? ?  ? ?Discussed that rash is nonspecific. No signs of infection. She may use steroid cream x 1 week. Cool compresses or antihistamine to control itching. Follow up as needed.  ? ?Meds ordered this encounter  ?Medications  ? triamcinolone cream (KENALOG) 0.1 %  ?  Sig: Apply 1 application. topically 2 (two) times daily.  ?  Dispense:  30 g  ?  Refill:  0  ?  Order Specific Question:   Supervising Provider  ?  Answer:   Hillard DankerRAWFORD, ELIZABETH A [[id number]]  ? ? ? ?Hetty BlendVickie Noe Pittsley, NP-C ? ?

## 2022-07-18 ENCOUNTER — Encounter (INDEPENDENT_AMBULATORY_CARE_PROVIDER_SITE_OTHER): Payer: Self-pay

## 2022-10-19 ENCOUNTER — Encounter: Payer: Self-pay | Admitting: Obstetrics & Gynecology

## 2022-10-19 ENCOUNTER — Other Ambulatory Visit (HOSPITAL_COMMUNITY)
Admission: RE | Admit: 2022-10-19 | Discharge: 2022-10-19 | Disposition: A | Discharge status: Home / Self Care | Payer: 59 | Source: Ambulatory Visit | Attending: Obstetrics & Gynecology | Admitting: Obstetrics & Gynecology

## 2022-10-19 ENCOUNTER — Ambulatory Visit (INDEPENDENT_AMBULATORY_CARE_PROVIDER_SITE_OTHER): Payer: 59 | Admitting: Obstetrics & Gynecology

## 2022-10-19 VITALS — BP 130/94 | HR 76 | Resp 16 | Ht 65.25 in | Wt 230.0 lb

## 2022-10-19 DIAGNOSIS — Z01419 Encounter for gynecological examination (general) (routine) without abnormal findings: Secondary | ICD-10-CM | POA: Insufficient documentation

## 2022-10-19 DIAGNOSIS — I10 Essential (primary) hypertension: Secondary | ICD-10-CM | POA: Diagnosis not present

## 2022-10-19 DIAGNOSIS — Z30015 Encounter for initial prescription of vaginal ring hormonal contraceptive: Secondary | ICD-10-CM

## 2022-10-19 MED ORDER — ETONOGESTREL-ETHINYL ESTRADIOL 0.12-0.015 MG/24HR VA RING: 1.0000 | VAGINAL_RING | VAGINAL | 4 refills | Status: DC

## 2022-10-19 NOTE — Progress Notes (Signed)
Kim Barnes 08-Sep-1976 [id number]   History:    46 y.o. G2P0A2 Stable boyfriend x 10 yrs   RP:  Established patient presenting for annual gyn exam    HPI: Well on Ortho Evra patch.  No BTB.  No pelvic pain.  Pap Neg 10/2019.  Pap reflex today.  Breasts normal.  Bilateral Dx  Mammo/Rt Breast US 09/2020: Probably Benign.  Overdue for Mammo, will schedule today.  BMI 37.98 post Bariatric surgery 12/06/2020. Walking. Health labs with family physician. Flu Vaccine declined.  cHTN, sometimes forgets to take her Norvasc.  BP 130/94 and 130/96 today.    Past medical history,surgical history, family history and social history were all reviewed and documented in the EPIC chart.  Gynecologic History Patient's last menstrual period was 09/25/2022 (exact date).  Obstetric History OB History  Gravida Para Term Preterm AB Living  2       2 0  SAB IAB Ectopic Multiple Live Births  1 1     0    # Outcome Date GA Lbr Len/2nd Weight Sex Delivery Anes PTL Lv  2 SAB           1 IAB              ROS: A ROS was performed and pertinent positives and negatives are included in the history. GENERAL: No fevers or chills. HEENT: No change in vision, no earache, sore throat or sinus congestion. NECK: No pain or stiffness. CARDIOVASCULAR: No chest pain or pressure. No palpitations. PULMONARY: No shortness of breath, cough or wheeze. GASTROINTESTINAL: No abdominal pain, nausea, vomiting or diarrhea, melena or bright red blood per rectum. GENITOURINARY: No urinary frequency, urgency, hesitancy or dysuria. MUSCULOSKELETAL: No joint or muscle pain, no back pain, no recent trauma. DERMATOLOGIC: No rash, no itching, no lesions. ENDOCRINE: No polyuria, polydipsia, no heat or cold intolerance. No recent change in weight. HEMATOLOGICAL: No anemia or easy bruising or bleeding. NEUROLOGIC: No headache, seizures, numbness, tingling or weakness. PSYCHIATRIC: No depression, no loss of interest in normal activity or change in  sleep pattern.     Exam:   BP (!) 130/94   Pulse 76   Resp 16   Ht 5' 5.25" (1.657 m)   Wt 230 lb (104.3 kg)   LMP 09/25/2022 (Exact Date)   BMI 37.98 kg/m   Second BP 130/96  Body mass index is 37.98 kg/m.  General appearance : Well developed well nourished female. No acute distress HEENT: Eyes: no retinal hemorrhage or exudates,  Neck supple, trachea midline, no carotid bruits, no thyroidmegaly Lungs: Clear to auscultation, no rhonchi or wheezes, or rib retractions  Heart: Regular rate and rhythm, no murmurs or gallops Breast:Examined in sitting and supine position were symmetrical in appearance, no palpable masses or tenderness,  no skin retraction, no nipple inversion, no nipple discharge, no skin discoloration, no axillary or supraclavicular lymphadenopathy Abdomen: no palpable masses or tenderness, no rebound or guarding Extremities: no edema or skin discoloration or tenderness  Pelvic: Vulva: Normal             Vagina: No gross lesions or discharge  Cervix: No gross lesions or discharge.  Pap reflex done.  Uterus  AV, normal size, shape and consistency, non-tender and mobile  Adnexa  Without masses or tenderness  Anus: Normal   Assessment/Plan:  46 y.o. female for annual exam   1. Encounter for routine gynecological examination with Papanicolaou smear of cervix Well on Ortho Evra patch.  No  BTB.  No pelvic pain.  Pap Neg 10/2019.  Pap reflex today.  Breasts normal.  Bilateral Dx  Mammo/Rt Breast US 09/2020: Probably Benign.  Overdue for Mammo, will schedule today.  BMI 37.98 post Bariatric surgery 12/06/2020. Walking. Health labs with family physician. Flu Vaccine declined.  cHTN, sometimes forgets to take her Norvasc.  BP 130/94 and 130/96 today.  - Cytology - PAP( [name])  2. Encounter for initial prescription of vaginal ring hormonal contraceptive Given patient's HBP today, recommend changing contraception to a Progesterone IUD.  Patient will think about it.   In the meantime, will lower the Estradiol content by changing from OrthoEvra to the Nuvaring.  Usage reviewed and prescription sent to pharmacy.  3. Chronic hypertension cHTN, sometimes forgets to take her Norvasc.  BP 130/94 and 130/96 today.  Counseling on the importance of complying to her treatment with Norvasc.  Recommend making an appointment asap with Dr Posey Rea and checking her BP daily.  Low salt diet.  4. Class 2 severe obesity due to excess calories with serious comorbidity and body mass index (BMI) of 37.0 to 37.9 in adult [name]) Low calorie/carb diet.  Regular fitness activities.  Other orders - etonogestrel-ethinyl estradiol (NUVARING) 0.12-0.015 MG/24HR vaginal ring; Place 1 each vaginally every 28 (twenty-eight) days. Insert vaginally and leave in place for 4 consecutive weeks, then switch ring for continuous use.   [name] MD, 9:19 AM 10/19/2022

## 2022-10-24 LAB — CYTOLOGY - PAP: Diagnosis: NEGATIVE

## 2022-11-09 ENCOUNTER — Other Ambulatory Visit: Payer: Self-pay | Admitting: Internal Medicine

## 2022-11-09 DIAGNOSIS — I10 Essential (primary) hypertension: Secondary | ICD-10-CM

## 2022-12-14 ENCOUNTER — Encounter: Payer: Self-pay | Admitting: Internal Medicine

## 2022-12-14 ENCOUNTER — Ambulatory Visit (INDEPENDENT_AMBULATORY_CARE_PROVIDER_SITE_OTHER): Payer: 59 | Admitting: Internal Medicine

## 2022-12-14 VITALS — BP 130/78 | HR 48 | Temp 98.0°F | Ht 62.25 in | Wt 233.0 lb

## 2022-12-14 DIAGNOSIS — Z9884 Bariatric surgery status: Secondary | ICD-10-CM

## 2022-12-14 DIAGNOSIS — E559 Vitamin D deficiency, unspecified: Secondary | ICD-10-CM

## 2022-12-14 DIAGNOSIS — D508 Other iron deficiency anemias: Secondary | ICD-10-CM | POA: Diagnosis not present

## 2022-12-14 DIAGNOSIS — I10 Essential (primary) hypertension: Secondary | ICD-10-CM

## 2022-12-14 DIAGNOSIS — E538 Deficiency of other specified B group vitamins: Secondary | ICD-10-CM

## 2022-12-14 DIAGNOSIS — J4520 Mild intermittent asthma, uncomplicated: Secondary | ICD-10-CM

## 2022-12-14 LAB — URINALYSIS, ROUTINE W REFLEX MICROSCOPIC
Bilirubin Urine: NEGATIVE
Ketones, ur: NEGATIVE
Leukocytes,Ua: NEGATIVE
Nitrite: NEGATIVE
Specific Gravity, Urine: 1.015 (ref 1.000–1.030)
Total Protein, Urine: NEGATIVE
Urine Glucose: NEGATIVE
Urobilinogen, UA: 0.2 (ref 0.0–1.0)
pH: 7 (ref 5.0–8.0)

## 2022-12-14 LAB — CBC WITH DIFFERENTIAL/PLATELET
Basophils Absolute: 0 10*3/uL (ref 0.0–0.1)
Basophils Relative: 0.8 % (ref 0.0–3.0)
Eosinophils Absolute: 0.1 10*3/uL (ref 0.0–0.7)
Eosinophils Relative: 2.6 % (ref 0.0–5.0)
HCT: 38.5 % (ref 36.0–46.0)
Hemoglobin: 13.2 g/dL (ref 12.0–15.0)
Lymphocytes Relative: 36.4 % (ref 12.0–46.0)
Lymphs Abs: 1.6 10*3/uL (ref 0.7–4.0)
MCHC: 34.4 g/dL (ref 30.0–36.0)
MCV: 90.9 fl (ref 78.0–100.0)
Monocytes Absolute: 0.3 10*3/uL (ref 0.1–1.0)
Monocytes Relative: 6 % (ref 3.0–12.0)
Neutro Abs: 2.4 10*3/uL (ref 1.4–7.7)
Neutrophils Relative %: 54.2 % (ref 43.0–77.0)
Platelets: 285 10*3/uL (ref 150.0–400.0)
RBC: 4.24 Mil/uL (ref 3.87–5.11)
RDW: 13.5 % (ref 11.5–15.5)
WBC: 4.4 10*3/uL (ref 4.0–10.5)

## 2022-12-14 LAB — HEMOGLOBIN A1C: Hgb A1c MFr Bld: 5.5 % (ref 4.6–6.5)

## 2022-12-14 LAB — COMPREHENSIVE METABOLIC PANEL
ALT: 16 U/L (ref 0–35)
AST: 20 U/L (ref 0–37)
Albumin: 4.3 g/dL (ref 3.5–5.2)
Alkaline Phosphatase: 79 U/L (ref 39–117)
BUN: 12 mg/dL (ref 6–23)
CO2: 31 mEq/L (ref 19–32)
Calcium: 9.6 mg/dL (ref 8.4–10.5)
Chloride: 101 mEq/L (ref 96–112)
Creatinine, Ser: 0.82 mg/dL (ref 0.40–1.20)
GFR: 86.94 mL/min (ref >60.00)
Glucose, Bld: 82 mg/dL (ref 70–99)
Potassium: 3.6 mEq/L (ref 3.5–5.1)
Sodium: 139 mEq/L (ref 135–145)
Total Bilirubin: 0.5 mg/dL (ref 0.2–1.2)
Total Protein: 7.7 g/dL (ref 6.0–8.3)

## 2022-12-14 LAB — VITAMIN D 25 HYDROXY (VIT D DEFICIENCY, FRACTURES): VITD: 23.9 ng/mL — ABNORMAL LOW (ref 30.00–100.00)

## 2022-12-14 LAB — TSH: TSH: 1.68 u[IU]/mL (ref 0.35–5.50)

## 2022-12-14 LAB — VITAMIN B12: Vitamin B-12: 280 pg/mL (ref 211–911)

## 2022-12-14 MED ORDER — ALBUTEROL SULFATE HFA 108 (90 BASE) MCG/ACT IN AERS: 2.0000 | INHALATION_SPRAY | RESPIRATORY_TRACT | 3 refills | Status: AC | PRN

## 2022-12-14 MED ORDER — AMLODIPINE BESYLATE 5 MG PO TABS: 5.0000 mg | ORAL_TABLET | Freq: Every day | ORAL | 3 refills | Status: DC

## 2022-12-14 NOTE — Assessment & Plan Note (Signed)
Check B12 

## 2022-12-14 NOTE — Assessment & Plan Note (Signed)
Miscarriage in Dec 2023, was off Rx S/p gastric bypass Dec 2021 Check labs

## 2022-12-14 NOTE — Progress Notes (Signed)
Subjective:  Patient ID: Kim Barnes, female    DOB: Mar 29, 1976  Age: 47 y.o. MRN: [id number]  CC: Follow-up   HPI Kim Barnes presents for HTN, asthma Miscarriage in Dec 2023, was off Rx S/p gastric bypass Dec 2021  Outpatient Medications Prior to Visit  Medication Sig Dispense Refill   B Complex-Folic Acid (B COMPLEX PLUS) TABS Take 1 tablet by mouth daily. 100 tablet 3   amLODipine (NORVASC) 5 MG tablet Take 1 tablet (5 mg total) by mouth daily. Over for annual appt due must see provider for future refills 30 tablet 0   etonogestrel-ethinyl estradiol (NUVARING) 0.12-0.015 MG/24HR vaginal ring Place 1 each vaginally every 28 (twenty-eight) days. Insert vaginally and leave in place for 4 consecutive weeks, then switch ring for continuous use. 3 each 4   albuterol (PROAIR HFA) 108 (90 Base) MCG/ACT inhaler Inhale 2 puffs into the lungs every 4 (four) hours as needed for wheezing or shortness of breath. 18 g 3   No facility-administered medications prior to visit.    ROS: Review of Systems  Constitutional:  Negative for activity change, appetite change, chills, fatigue and unexpected weight change.  HENT:  Negative for congestion, mouth sores and sinus pressure.   Eyes:  Negative for visual disturbance.  Respiratory:  Negative for cough and chest tightness.   Gastrointestinal:  Negative for abdominal pain and nausea.  Genitourinary:  Negative for difficulty urinating, frequency and vaginal pain.  Musculoskeletal:  Negative for back pain and gait problem.  Skin:  Negative for pallor and rash.  Neurological:  Negative for dizziness, tremors, weakness, numbness and headaches.  Psychiatric/Behavioral:  Negative for confusion and sleep disturbance.     Objective:  BP 130/78 (BP Location: Left Arm, Patient Position: Sitting, Cuff Size: Normal)   Pulse (!) 48   Temp 98 F (36.7 C) (Oral)   Ht 5' 2.25" (1.581 m)   Wt 233 lb (105.7 kg)   SpO2 93%   BMI 42.27 kg/m   BP  Readings from Last 3 Encounters:  12/14/22 130/78  10/19/22 (!) 130/94  03/21/22 (!) 170/100    Wt Readings from Last 3 Encounters:  12/14/22 233 lb (105.7 kg)  10/19/22 230 lb (104.3 kg)  03/21/22 223 lb (101.2 kg)    Physical Exam Constitutional:      General: She is not in acute distress.    Appearance: She is well-developed. She is obese.  HENT:     Head: Normocephalic.     Right Ear: External ear normal.     Left Ear: External ear normal.     Nose: Nose normal.  Eyes:     General:        Right eye: No discharge.        Left eye: No discharge.     Conjunctiva/sclera: Conjunctivae normal.     Pupils: Pupils are equal, round, and reactive to light.  Neck:     Thyroid: No thyromegaly.     Vascular: No JVD.     Trachea: No tracheal deviation.  Cardiovascular:     Rate and Rhythm: Normal rate and regular rhythm.     Heart sounds: Normal heart sounds.  Pulmonary:     Effort: No respiratory distress.     Breath sounds: No stridor. No wheezing.  Abdominal:     General: Bowel sounds are normal. There is no distension.     Palpations: Abdomen is soft. There is no mass.     Tenderness: There is  no abdominal tenderness. There is no guarding or rebound.  Musculoskeletal:        General: No tenderness.     Cervical back: Normal range of motion and neck supple. No rigidity.  Lymphadenopathy:     Cervical: No cervical adenopathy.  Skin:    Findings: No erythema or rash.  Neurological:     Cranial Nerves: No cranial nerve deficit.     Motor: No abnormal muscle tone.     Coordination: Coordination normal.     Deep Tendon Reflexes: Reflexes normal.  Psychiatric:        Behavior: Behavior normal.        Thought Content: Thought content normal.        Judgment: Judgment normal.     Lab Results  Component Value Date   WBC 4.6 08/29/2021   HGB 10.8 (L) 08/29/2021   HCT 32.6 (L) 08/29/2021   PLT 316.0 08/29/2021   GLUCOSE 68 (L) 08/29/2021   CHOL 188 08/29/2021   TRIG  75.0 08/29/2021   HDL 54.40 08/29/2021   LDLDIRECT 149.4 05/24/2010   LDLCALC 119 (H) 08/29/2021   ALT 14 08/29/2021   AST 19 08/29/2021   NA 142 08/29/2021   K 3.3 (L) 08/29/2021   CL 103 08/29/2021   CREATININE 0.76 08/29/2021   BUN 10 08/29/2021   CO2 31 08/29/2021   TSH 1.95 08/29/2021   INR 1.16 02/04/2017   HGBA1C 4.9 08/29/2021    No results found.  Assessment & Plan:   Problem List Items Addressed This Visit       Cardiovascular and Mediastinum   Essential hypertension   Relevant Medications   amLODipine (NORVASC) 5 MG tablet     Respiratory   Asthma    On Ventolin MDI prn      Relevant Medications   albuterol (PROAIR HFA) 108 (90 Base) MCG/ACT inhaler     Other   Vitamin D deficiency    Check Vit D      Relevant Orders   VITAMIN D 25 Hydroxy (Vit-D Deficiency, Fractures)   Morbid obesity due to excess calories ([place])    Better S/p gastric bypass Dec 2021      Relevant Orders   TSH   Urinalysis   CBC with Differential/Platelet   Comprehensive metabolic panel   Vitamin S81   VITAMIN D 25 Hydroxy (Vit-D Deficiency, Fractures)   Hemoglobin A1c   Iron, TIBC and Ferritin Panel   Vitamin B1   Zinc   H/O gastric bypass - Primary    Miscarriage in Dec 2023, was off Rx S/p gastric bypass Dec 2021 Check labs      Relevant Orders   TSH   Urinalysis   CBC with Differential/Platelet   Comprehensive metabolic panel   Vitamin J03   VITAMIN D 25 Hydroxy (Vit-D Deficiency, Fractures)   Hemoglobin A1c   Iron, TIBC and Ferritin Panel   Vitamin B1   Zinc   B12 deficiency    Check B12      Relevant Orders   Vitamin B12   Anemia, iron deficiency    Check CBC, iron      Relevant Orders   TSH   Urinalysis   CBC with Differential/Platelet   Comprehensive metabolic panel   Vitamin P59   VITAMIN D 25 Hydroxy (Vit-D Deficiency, Fractures)   Hemoglobin A1c   Iron, TIBC and Ferritin Panel   Vitamin B1   Zinc      Meds ordered this  encounter  Medications   amLODipine (NORVASC) 5 MG tablet    Sig: Take 1 tablet (5 mg total) by mouth daily. Over for annual appt due must see provider for future refills    Dispense:  90 tablet    Refill:  3   albuterol (PROAIR HFA) 108 (90 Base) MCG/ACT inhaler    Sig: Inhale 2 puffs into the lungs every 4 (four) hours as needed for wheezing or shortness of breath.    Dispense:  18 g    Refill:  3      Follow-up: Return in about 3 months (around 03/15/2023) for a follow-up visit.  Sonda Primes, MD

## 2022-12-14 NOTE — Assessment & Plan Note (Signed)
On Ventolin MDI prn

## 2022-12-14 NOTE — Assessment & Plan Note (Signed)
Check CBC, iron 

## 2022-12-14 NOTE — Assessment & Plan Note (Signed)
Better S/p gastric bypass Dec 2021

## 2022-12-14 NOTE — Assessment & Plan Note (Signed)
Check Vit D 

## 2022-12-16 ENCOUNTER — Other Ambulatory Visit: Payer: Self-pay | Admitting: Internal Medicine

## 2022-12-16 MED ORDER — VITAMIN D3 50 MCG (2000 UT) PO CAPS: 2000.0000 [IU] | ORAL_CAPSULE | Freq: Every day | ORAL | 3 refills | Status: DC

## 2022-12-16 MED ORDER — VITAMIN D (ERGOCALCIFEROL) 1.25 MG (50000 UNIT) PO CAPS: 50000.0000 [IU] | ORAL_CAPSULE | ORAL | 0 refills | Status: DC

## 2022-12-18 LAB — IRON,TIBC AND FERRITIN PANEL
%SAT: 17 % (calc) (ref 16–45)
Ferritin: 22 ng/mL (ref 16–232)
Iron: 54 ug/dL (ref 40–190)
TIBC: 315 mcg/dL (calc) (ref 250–450)

## 2022-12-18 LAB — ZINC: Zinc: 62 ug/dL (ref 60–130)

## 2022-12-18 LAB — VITAMIN B1: Vitamin B1 (Thiamine): 13 nmol/L (ref 8–30)

## 2023-02-11 ENCOUNTER — Other Ambulatory Visit: Payer: Self-pay | Admitting: Internal Medicine

## 2023-03-19 ENCOUNTER — Encounter: Payer: Self-pay | Admitting: Internal Medicine

## 2023-03-19 ENCOUNTER — Ambulatory Visit (INDEPENDENT_AMBULATORY_CARE_PROVIDER_SITE_OTHER): Payer: 59 | Admitting: Internal Medicine

## 2023-03-19 VITALS — BP 136/84 | Temp 98.7°F | Ht 62.5 in | Wt 232.0 lb

## 2023-03-19 DIAGNOSIS — I10 Essential (primary) hypertension: Secondary | ICD-10-CM | POA: Diagnosis not present

## 2023-03-19 DIAGNOSIS — R7303 Prediabetes: Secondary | ICD-10-CM

## 2023-03-19 DIAGNOSIS — Z6841 Body Mass Index (BMI) 40.0 and over, adult: Secondary | ICD-10-CM

## 2023-03-19 DIAGNOSIS — E538 Deficiency of other specified B group vitamins: Secondary | ICD-10-CM

## 2023-03-19 DIAGNOSIS — J4520 Mild intermittent asthma, uncomplicated: Secondary | ICD-10-CM | POA: Diagnosis not present

## 2023-03-19 NOTE — Assessment & Plan Note (Signed)
On B complex 

## 2023-03-19 NOTE — Progress Notes (Signed)
Subjective:  Patient ID: Kim Barnes, female    DOB: 1976/03/20  Age: 47 y.o. MRN: [id number]  CC: Follow-up (3 MNTH F/U)   HPI Jailee L Moudy presents for HTN, obesity, pre-DM, Vit D def   Outpatient Medications Prior to Visit  Medication Sig Dispense Refill   albuterol (PROAIR HFA) 108 (90 Base) MCG/ACT inhaler Inhale 2 puffs into the lungs every 4 (four) hours as needed for wheezing or shortness of breath. 18 g 3   amLODipine (NORVASC) 5 MG tablet Take 1 tablet (5 mg total) by mouth daily. Over for annual appt due must see provider for future refills 90 tablet 3   B Complex-Folic Acid (B COMPLEX PLUS) TABS Take 1 tablet by mouth daily. 100 tablet 3   Cholecalciferol (VITAMIN D3) 50 MCG (2000 UT) capsule Take 1 capsule (2,000 Units total) by mouth daily. 100 capsule 3   topiramate (TOPAMAX) 50 MG tablet Take 50 mg by mouth daily. TAKE 1 TAB DAILY     Vitamin D, Ergocalciferol, (DRISDOL) 1.25 MG (50000 UNIT) CAPS capsule Take 1 capsule (50,000 Units total) by mouth every 7 (seven) days. 8 capsule 0   No facility-administered medications prior to visit.    ROS: Review of Systems  Constitutional:  Negative for activity change, appetite change, chills, fatigue and unexpected weight change.  HENT:  Negative for congestion, mouth sores and sinus pressure.   Eyes:  Negative for visual disturbance.  Respiratory:  Negative for cough and chest tightness.   Gastrointestinal:  Negative for abdominal pain and nausea.  Genitourinary:  Negative for difficulty urinating, frequency and vaginal pain.  Musculoskeletal:  Negative for back pain and gait problem.  Skin:  Negative for pallor and rash.  Neurological:  Negative for dizziness, tremors, weakness, numbness and headaches.  Psychiatric/Behavioral:  Negative for confusion and sleep disturbance.     Objective:  BP 136/84 (BP Location: Right Arm, Patient Position: Sitting, Cuff Size: Large)   Temp 98.7 F (37.1 C) (Oral)   Ht 5' 2.5"  (1.588 m)   Wt 232 lb (105.2 kg)   BMI 41.76 kg/m   BP Readings from Last 3 Encounters:  03/19/23 136/84  12/14/22 130/78  10/19/22 (!) 130/94    Wt Readings from Last 3 Encounters:  03/19/23 232 lb (105.2 kg)  12/14/22 233 lb (105.7 kg)  10/19/22 230 lb (104.3 kg)    Physical Exam Constitutional:      General: She is not in acute distress.    Appearance: She is well-developed.  HENT:     Head: Normocephalic.     Right Ear: External ear normal.     Left Ear: External ear normal.     Nose: Nose normal.  Eyes:     General:        Right eye: No discharge.        Left eye: No discharge.     Conjunctiva/sclera: Conjunctivae normal.     Pupils: Pupils are equal, round, and reactive to light.  Neck:     Thyroid: No thyromegaly.     Vascular: No JVD.     Trachea: No tracheal deviation.  Cardiovascular:     Rate and Rhythm: Normal rate and regular rhythm.     Heart sounds: Normal heart sounds.  Pulmonary:     Effort: No respiratory distress.     Breath sounds: No stridor. No wheezing.  Abdominal:     General: Bowel sounds are normal. There is no distension.  Palpations: Abdomen is soft. There is no mass.     Tenderness: There is no abdominal tenderness. There is no guarding or rebound.  Musculoskeletal:        General: No tenderness.     Cervical back: Normal range of motion and neck supple. No rigidity.  Lymphadenopathy:     Cervical: No cervical adenopathy.  Skin:    Findings: No erythema or rash.  Neurological:     Cranial Nerves: No cranial nerve deficit.     Motor: No abnormal muscle tone.     Coordination: Coordination normal.     Deep Tendon Reflexes: Reflexes normal.  Psychiatric:        Behavior: Behavior normal.        Thought Content: Thought content normal.        Judgment: Judgment normal.     Lab Results  Component Value Date   WBC 4.4 12/14/2022   HGB 13.2 12/14/2022   HCT 38.5 12/14/2022   PLT 285.0 12/14/2022   GLUCOSE 82 12/14/2022    CHOL 188 08/29/2021   TRIG 75.0 08/29/2021   HDL 54.40 08/29/2021   LDLDIRECT 149.4 05/24/2010   LDLCALC 119 (H) 08/29/2021   ALT 16 12/14/2022   AST 20 12/14/2022   NA 139 12/14/2022   K 3.6 12/14/2022   CL 101 12/14/2022   CREATININE 0.82 12/14/2022   BUN 12 12/14/2022   CO2 31 12/14/2022   TSH 1.68 12/14/2022   INR 1.16 02/04/2017   HGBA1C 5.5 12/14/2022    No results found.  Assessment & Plan:   Problem List Items Addressed This Visit       Cardiovascular and Mediastinum   Essential hypertension - Primary    On Amlodipine  Highest wt 396 lbs On Topamax Pannus reduction surgery is pending on Friday        Respiratory   Asthma    No sx's after wt lloss        Other   B12 deficiency    On B complex      Class 3 severe obesity with serious comorbidity and body mass index (BMI) of 60.0 to 69.9 in adult    Highest wt 396 lbs On Topamax Pannus reduction surgery is pending on Friday      Morbid obesity due to excess calories    Highest wt 396 lbs On Topamax Pannus reduction surgery is pending on Friday      Prediabetes    Highest wt 396 lbs On Topamax          No orders of the defined types were placed in this encounter.     Follow-up: No follow-ups on file.  Sonda Primes, MD

## 2023-03-19 NOTE — Assessment & Plan Note (Signed)
Highest wt 396 lbs On Topamax Pannus reduction surgery is pending on Friday

## 2023-03-19 NOTE — Assessment & Plan Note (Addendum)
On Amlodipine  Highest wt 396 lbs On Topamax Pannus reduction surgery is pending on Friday

## 2023-03-19 NOTE — Assessment & Plan Note (Signed)
Highest wt 396 lbs On Topamax Pannus reduction surgery is pending on Friday 

## 2023-03-19 NOTE — Assessment & Plan Note (Signed)
No sx's after wt lloss

## 2023-03-19 NOTE — Assessment & Plan Note (Signed)
Highest wt 396 lbs On Topamax

## 2023-04-16 ENCOUNTER — Other Ambulatory Visit: Payer: Self-pay | Admitting: Obstetrics & Gynecology

## 2023-04-17 ENCOUNTER — Encounter: Payer: Self-pay | Admitting: Obstetrics & Gynecology

## 2023-04-17 NOTE — Telephone Encounter (Signed)
Pt scheduled for 04/24/2023.

## 2023-04-24 ENCOUNTER — Ambulatory Visit (INDEPENDENT_AMBULATORY_CARE_PROVIDER_SITE_OTHER): Payer: 59 | Admitting: Obstetrics & Gynecology

## 2023-04-24 ENCOUNTER — Encounter: Payer: Self-pay | Admitting: Obstetrics & Gynecology

## 2023-04-24 VITALS — BP 118/82 | HR 76 | Resp 16

## 2023-04-24 DIAGNOSIS — I10 Essential (primary) hypertension: Secondary | ICD-10-CM

## 2023-04-24 DIAGNOSIS — Z30011 Encounter for initial prescription of contraceptive pills: Secondary | ICD-10-CM | POA: Diagnosis not present

## 2023-04-24 MED ORDER — NORETHIN ACE-ETH ESTRAD-FE 1-20 MG-MCG(24) PO TABS: 1.0000 | ORAL_TABLET | Freq: Every day | ORAL | 4 refills | Status: DC

## 2023-04-24 NOTE — Progress Notes (Signed)
    DESSENCE OSLER December 16, 1975 [id number]        47 y.o.  G3P0030   RP: Contraception management given cHTN  HPI: Patient wanted a refill of the [facility] patch, didn't like the Nuvaring.  cHTN on Norvasc.  BP improving under the close management of her Fam MD.  Actively losing weight.     OB History  Gravida Para Term Preterm AB Living  3       3 0  SAB IAB Ectopic Multiple Live Births  2 1     0    # Outcome Date GA Lbr Len/2nd Weight Sex Delivery Anes PTL Lv  3 SAB           2 SAB           1 IAB             Past medical history,surgical history, problem list, medications, allergies, family history and social history were all reviewed and documented in the EPIC chart.   Directed ROS with pertinent positives and negatives documented in the history of present illness/assessment and plan.  Exam:  Vitals:   04/24/23 0840  BP: 118/82  Pulse: 76  Resp: 16   General appearance:  Normal   Gynecologic exam: Deferred   Assessment/Plan:  47 y.o. G3P0030   1. Encounter for initial prescription of contraceptive pills Patient wanted a refill of the [facility] patch, didn't like the Nuvaring.  cHTN on Norvasc.  BP improving under the close management of her Fam MD.  Actively losing weight.   BP today at 118/82.  Counseling on contraception.  Decision to start on a low BCP with the generic of LoEstrin 24 Fe 1/20.  Usage known, prescription sent to pharmacy.  2. Chronic hypertension cHTN on Norvasc.  BP improving under the close management of her Fam MD.  Actively losing weight.    Other orders - LOMAIRA 8 MG TABS; One daily at 10 am x 1 week, can increase to BID dosing at 10 am and 2 pm - topiramate (TOPAMAX) 25 MG tablet - Norethindrone Acetate-Ethinyl Estrad-FE (LOESTRIN 24 FE) 1-20 MG-MCG(24) tablet; Take 1 tablet by mouth daily.   [name] MD, 8:50 AM 04/24/2023

## 2023-09-18 ENCOUNTER — Encounter: Payer: Self-pay | Admitting: Internal Medicine

## 2023-09-18 ENCOUNTER — Ambulatory Visit (INDEPENDENT_AMBULATORY_CARE_PROVIDER_SITE_OTHER): Payer: 59 | Admitting: Internal Medicine

## 2023-09-18 VITALS — BP 130/82 | Temp 98.2°F | Ht 62.5 in | Wt 232.0 lb

## 2023-09-18 DIAGNOSIS — E66813 Obesity, class 3: Secondary | ICD-10-CM | POA: Diagnosis not present

## 2023-09-18 DIAGNOSIS — R7303 Prediabetes: Secondary | ICD-10-CM

## 2023-09-18 DIAGNOSIS — I10 Essential (primary) hypertension: Secondary | ICD-10-CM

## 2023-09-18 DIAGNOSIS — Z1211 Encounter for screening for malignant neoplasm of colon: Secondary | ICD-10-CM

## 2023-09-18 DIAGNOSIS — Z6841 Body Mass Index (BMI) 40.0 and over, adult: Secondary | ICD-10-CM

## 2023-09-18 LAB — CBC WITH DIFFERENTIAL/PLATELET
Basophils Absolute: 0.1 10*3/uL (ref 0.0–0.1)
Basophils Relative: 1.2 % (ref 0.0–3.0)
Eosinophils Absolute: 0.1 10*3/uL (ref 0.0–0.7)
Eosinophils Relative: 1.1 % (ref 0.0–5.0)
HCT: 39.9 % (ref 36.0–46.0)
Hemoglobin: 13.1 g/dL (ref 12.0–15.0)
Lymphocytes Relative: 35 % (ref 12.0–46.0)
Lymphs Abs: 1.6 10*3/uL (ref 0.7–4.0)
MCHC: 32.9 g/dL (ref 30.0–36.0)
MCV: 88.1 fL (ref 78.0–100.0)
Monocytes Absolute: 0.3 10*3/uL (ref 0.1–1.0)
Monocytes Relative: 6.4 % (ref 3.0–12.0)
Neutro Abs: 2.6 10*3/uL (ref 1.4–7.7)
Neutrophils Relative %: 56.3 % (ref 43.0–77.0)
Platelets: 302 10*3/uL (ref 150.0–400.0)
RBC: 4.52 Mil/uL (ref 3.87–5.11)
RDW: 14 % (ref 11.5–15.5)
WBC: 4.6 10*3/uL (ref 4.0–10.5)

## 2023-09-18 LAB — VITAMIN B12: Vitamin B-12: 1087 pg/mL — ABNORMAL HIGH (ref 211–911)

## 2023-09-18 LAB — COMPREHENSIVE METABOLIC PANEL
ALT: 12 U/L (ref 0–35)
AST: 17 U/L (ref 0–37)
Albumin: 4.2 g/dL (ref 3.5–5.2)
Alkaline Phosphatase: 100 U/L (ref 39–117)
BUN: 9 mg/dL (ref 6–23)
CO2: 28 meq/L (ref 19–32)
Calcium: 9.6 mg/dL (ref 8.4–10.5)
Chloride: 103 meq/L (ref 96–112)
Creatinine, Ser: 0.88 mg/dL (ref 0.40–1.20)
GFR: 79.45 mL/min (ref >60.00)
Glucose, Bld: 85 mg/dL (ref 70–99)
Potassium: 3.2 meq/L — ABNORMAL LOW (ref 3.5–5.1)
Sodium: 140 meq/L (ref 135–145)
Total Bilirubin: 0.6 mg/dL (ref 0.2–1.2)
Total Protein: 7.5 g/dL (ref 6.0–8.3)

## 2023-09-18 LAB — URINALYSIS, ROUTINE W REFLEX MICROSCOPIC
Bilirubin Urine: NEGATIVE
Ketones, ur: NEGATIVE
Leukocytes,Ua: NEGATIVE
Nitrite: NEGATIVE
Specific Gravity, Urine: 1.015 (ref 1.000–1.030)
Total Protein, Urine: NEGATIVE
Urine Glucose: NEGATIVE
Urobilinogen, UA: 0.2 (ref 0.0–1.0)
WBC, UA: NONE SEEN (ref 0–?)
pH: 7 (ref 5.0–8.0)

## 2023-09-18 LAB — HEMOGLOBIN A1C: Hgb A1c MFr Bld: 5.4 % (ref 4.6–6.5)

## 2023-09-18 LAB — VITAMIN D 25 HYDROXY (VIT D DEFICIENCY, FRACTURES): VITD: 24.66 ng/mL — ABNORMAL LOW (ref 30.00–100.00)

## 2023-09-18 LAB — TSH: TSH: 2.01 u[IU]/mL (ref 0.35–5.50)

## 2023-09-18 NOTE — Assessment & Plan Note (Signed)
On Amlodipine  Highest wt 396 lbs On Topamax, Phentermine S/p pannus reduction surgery

## 2023-09-18 NOTE — Progress Notes (Signed)
Subjective:  Patient ID: Kim Barnes, female    DOB: October 21, 1976  Age: 47 y.o. MRN: [id number]  CC: Follow-up (6 MNTH F/U)   HPI Kim Barnes presents for HTN, s/p wt loss surgery,HTN, vit def  Outpatient Medications Prior to Visit  Medication Sig Dispense Refill   albuterol (PROAIR HFA) 108 (90 Base) MCG/ACT inhaler Inhale 2 puffs into the lungs every 4 (four) hours as needed for wheezing or shortness of breath. 18 g 3   amLODipine (NORVASC) 5 MG tablet Take 1 tablet (5 mg total) by mouth daily. Over for annual appt due must see provider for future refills 90 tablet 3   B Complex-Folic Acid (B COMPLEX PLUS) TABS Take 1 tablet by mouth daily. 100 tablet 3   Cholecalciferol (VITAMIN D3) 50 MCG (2000 UT) capsule Take 1 capsule (2,000 Units total) by mouth daily. 100 capsule 3   LOMAIRA 8 MG TABS One daily at 10 am x 1 week, can increase to BID dosing at 10 am and 2 pm     Norethindrone Acetate-Ethinyl Estrad-FE (LOESTRIN 24 FE) 1-20 MG-MCG(24) tablet Take 1 tablet by mouth daily. 84 tablet 4   topiramate (TOPAMAX) 25 MG tablet      No facility-administered medications prior to visit.    ROS: Review of Systems  Constitutional:  Negative for activity change, appetite change, chills, fatigue and unexpected weight change.  HENT:  Negative for congestion, mouth sores and sinus pressure.   Eyes:  Negative for visual disturbance.  Respiratory:  Negative for cough and chest tightness.   Gastrointestinal:  Negative for abdominal pain and nausea.  Genitourinary:  Negative for difficulty urinating, frequency and vaginal pain.  Musculoskeletal:  Negative for back pain and gait problem.  Skin:  Negative for pallor and rash.  Neurological:  Negative for dizziness, tremors, weakness, numbness and headaches.  Psychiatric/Behavioral:  Negative for confusion and sleep disturbance.     Objective:  BP 130/82 (BP Location: Left Arm, Patient Position: Sitting, Cuff Size: Normal)   Temp 98.2 F  (36.8 C) (Oral)   Ht 5' 2.5" (1.588 m)   Wt 232 lb (105.2 kg)   BMI 41.76 kg/m   BP Readings from Last 3 Encounters:  09/18/23 130/82  04/24/23 118/82  03/19/23 136/84    Wt Readings from Last 3 Encounters:  09/18/23 232 lb (105.2 kg)  03/19/23 232 lb (105.2 kg)  12/14/22 233 lb (105.7 kg)    Physical Exam Constitutional:      General: She is not in acute distress.    Appearance: She is well-developed.  HENT:     Head: Normocephalic.     Right Ear: External ear normal.     Left Ear: External ear normal.     Nose: Nose normal.  Eyes:     General:        Right eye: No discharge.        Left eye: No discharge.     Conjunctiva/sclera: Conjunctivae normal.     Pupils: Pupils are equal, round, and reactive to light.  Neck:     Thyroid: No thyromegaly.     Vascular: No JVD.     Trachea: No tracheal deviation.  Cardiovascular:     Rate and Rhythm: Normal rate and regular rhythm.     Heart sounds: Normal heart sounds.  Pulmonary:     Effort: No respiratory distress.     Breath sounds: No stridor. No wheezing.  Abdominal:     General: Bowel sounds  are normal. There is no distension.     Palpations: Abdomen is soft. There is no mass.     Tenderness: There is no abdominal tenderness. There is no guarding or rebound.  Musculoskeletal:        General: No tenderness.     Cervical back: Normal range of motion and neck supple. No rigidity.  Lymphadenopathy:     Cervical: No cervical adenopathy.  Skin:    Findings: No erythema or rash.  Neurological:     Cranial Nerves: No cranial nerve deficit.     Motor: No abnormal muscle tone.     Coordination: Coordination normal.     Deep Tendon Reflexes: Reflexes normal.  Psychiatric:        Behavior: Behavior normal.        Thought Content: Thought content normal.        Judgment: Judgment normal.     Lab Results  Component Value Date   WBC 4.4 12/14/2022   HGB 13.2 12/14/2022   HCT 38.5 12/14/2022   PLT 285.0 12/14/2022    GLUCOSE 82 12/14/2022   CHOL 188 08/29/2021   TRIG 75.0 08/29/2021   HDL 54.40 08/29/2021   LDLDIRECT 149.4 05/24/2010   LDLCALC 119 (H) 08/29/2021   ALT 16 12/14/2022   AST 20 12/14/2022   NA 139 12/14/2022   K 3.6 12/14/2022   CL 101 12/14/2022   CREATININE 0.82 12/14/2022   BUN 12 12/14/2022   CO2 31 12/14/2022   TSH 1.68 12/14/2022   INR 1.16 02/04/2017   HGBA1C 5.5 12/14/2022    No results found.  Assessment & Plan:   Problem List Items Addressed This Visit     Essential hypertension - Primary    On Amlodipine  Highest wt 396 lbs On Topamax, Phentermine S/p pannus reduction surgery       Relevant Orders   Comprehensive metabolic panel   CBC with Differential/Platelet   Iron, TIBC and Ferritin Panel   Hemoglobin A1c   TSH   Urinalysis   Vitamin B12   VITAMIN D 25 Hydroxy (Vit-D Deficiency, Fractures)   Prediabetes     S/p stomach bypass on 12/06/20 - Dr Lowell Guitar - lost wt from 358 to 217 lbs Highest wt 396 lbs On Topamax, Phentermine S/p pannus reduction surgery       Relevant Orders   Comprehensive metabolic panel   CBC with Differential/Platelet   Iron, TIBC and Ferritin Panel   Hemoglobin A1c   TSH   Urinalysis   Vitamin B12   VITAMIN D 25 Hydroxy (Vit-D Deficiency, Fractures)   Class 3 severe obesity with serious comorbidity and body mass index (BMI) of 60.0 to 69.9 in adult [facility])    S/p stomach bypass on 12/06/20 - Dr Lowell Guitar - lost wt from 358 to 217 lbs Highest wt 396 lbs On Topamax, Phentermine S/p pannus reduction surgery       Relevant Orders   Comprehensive metabolic panel   CBC with Differential/Platelet   Iron, TIBC and Ferritin Panel   Hemoglobin A1c   TSH   Urinalysis   Vitamin B12   VITAMIN D 25 Hydroxy (Vit-D Deficiency, Fractures)   Morbid obesity due to excess calories (HCC)   Relevant Orders   Comprehensive metabolic panel   CBC with Differential/Platelet   Iron, TIBC and Ferritin Panel   Hemoglobin A1c   TSH    Urinalysis   Vitamin B12   VITAMIN D 25 Hydroxy (Vit-D Deficiency, Fractures)   Other Visit Diagnoses  Screening for colon cancer       Relevant Orders   Ambulatory referral to Gastroenterology         No orders of the defined types were placed in this encounter.     Follow-up: Return in about 6 months (around 03/18/2024) for Wellness Exam.  Sonda Primes, MD

## 2023-09-18 NOTE — Assessment & Plan Note (Addendum)
S/p stomach bypass on 12/06/20 - Dr Lowell Guitar - lost wt from 358 to 217 lbs Highest wt 396 lbs On Topamax, Phentermine S/p pannus reduction surgery

## 2023-09-18 NOTE — Assessment & Plan Note (Signed)
  S/p stomach bypass on 12/06/20 - Dr Lowell Guitar - lost wt from 358 to 217 lbs Highest wt 396 lbs On Topamax, Phentermine S/p pannus reduction surgery

## 2023-09-19 LAB — IRON,TIBC AND FERRITIN PANEL
%SAT: 14 % — ABNORMAL LOW (ref 16–45)
Ferritin: 13 ng/mL — ABNORMAL LOW (ref 16–232)
Iron: 48 ug/dL (ref 40–190)
TIBC: 334 ug/dL (ref 250–450)

## 2023-09-22 ENCOUNTER — Other Ambulatory Visit: Payer: Self-pay | Admitting: Internal Medicine

## 2023-09-22 MED ORDER — VITAMIN D3 50 MCG (2000 UT) PO CAPS
4000.0000 [IU] | ORAL_CAPSULE | Freq: Every day | ORAL | 3 refills | Status: AC
Start: 2023-09-22 — End: ?

## 2023-09-22 MED ORDER — POTASSIUM CHLORIDE CRYS ER 20 MEQ PO TBCR: 20.0000 meq | EXTENDED_RELEASE_TABLET | Freq: Every day | ORAL | 3 refills | Status: DC

## 2023-09-25 ENCOUNTER — Telehealth: Payer: Self-pay | Admitting: Gastroenterology

## 2023-09-25 NOTE — Telephone Encounter (Signed)
Ok to schedule direct screening colonoscopy, next available appointment

## 2023-09-25 NOTE — Telephone Encounter (Signed)
Good Morning Dr Lavon Paganini   Patient preferred provider  We have received a referral for patient to have a colon procedure. Patient has never had procedure but does have previous history with digestive health from [address]222.   Records are available for review in epic. Please revise and advise on scheduling.   Thank you

## 2023-10-08 NOTE — Telephone Encounter (Signed)
Called patient to advise on scheduling. Left voicemail

## 2023-10-23 ENCOUNTER — Ambulatory Visit (INDEPENDENT_AMBULATORY_CARE_PROVIDER_SITE_OTHER): Payer: 59 | Admitting: Obstetrics and Gynecology

## 2023-10-23 ENCOUNTER — Ambulatory Visit: Payer: 59 | Admitting: Obstetrics & Gynecology

## 2023-10-23 ENCOUNTER — Encounter: Payer: Self-pay | Admitting: Obstetrics and Gynecology

## 2023-10-23 VITALS — BP 124/82 | HR 71 | Ht 65.35 in | Wt 226.0 lb

## 2023-10-23 DIAGNOSIS — Z01419 Encounter for gynecological examination (general) (routine) without abnormal findings: Secondary | ICD-10-CM

## 2023-10-23 NOTE — Patient Instructions (Signed)

## 2023-10-23 NOTE — Assessment & Plan Note (Addendum)
Cervical cancer screening performed according to ASCCP guidelines. Encouraged annual mammogram screening, number provided  Colonoscopy referral ordered by PCP Labs and immunizations with her primary Declines contraception Encouraged healthy lifestyle practices with diet and exercise For patients under 45yo, I recommend 1000mg  calcium daily and 600IU of vitamin D daily.

## 2023-10-23 NOTE — Progress Notes (Signed)
47 y.o. G37P0030 female with cHTN, s/p bariatric surgery (2021) here for annual exam. Stable boyfriend x 10 yrs. Works at [employer].  Patient's last menstrual period was 10/01/2023 (exact date). Period Duration (Days): 5 Period Pattern: Regular Menstrual Flow: Moderate Menstrual Control: Maxi pad, Tampon Dysmenorrhea: None  Abnormal bleeding: none Pelvic discharge or pain: none Breast mass, nipple discharge or skin changes : none Birth control: none Last PAP:     Component Value Date/Time   DIAGPAP  10/19/2022 0937    - Negative for intraepithelial lesion or malignancy (NILM)   ADEQPAP  10/19/2022 0937    Satisfactory for evaluation; transformation zone component PRESENT.   Last mammogram: 2021 Last colonoscopy: referral sent by PCP Sexually active: yes  Exercising: none  GYN HISTORY: No significant history  OB History  Gravida Para Term Preterm AB Living  3       3 0  SAB IAB Ectopic Multiple Live Births  2 1     0    # Outcome Date GA Lbr Len/2nd Weight Sex Type Anes PTL Lv  3 SAB           2 SAB           1 IAB             Past Medical History:  Diagnosis Date   Anxiety    Asthma    prn inhaler   Back pain    Bell's palsy    Carpal tunnel syndrome of left wrist 01/2013   Chest pain    Cold hands and feet    Cough    Depression    Dry mouth    Dry skin    Fatigue    GERD (gastroesophageal reflux disease)    occasional; no current med.   History of brain tumor 1996   no neurologic deficits   HTN (hypertension)    under control with med., has been on med. x 5 yr.   Joint pain    Obesity    Obstructive sleep apnea    PCOS (polycystic ovarian syndrome)    Polycystic ovarian syndrome    no current med.; irregular periods   Shortness of breath    Stress    Swelling of extremity    Wheezing     Past Surgical History:  Procedure Laterality Date   BRAIN SURGERY  12/10/1994   exc. tumor   CARPAL TUNNEL RELEASE Left 02/10/2013    Procedure: CARPAL TUNNEL RELEASE;  Surgeon: Nicki Reaper, MD;  Location: [facility];  Service: Orthopedics;  Laterality: Left;  ANESTHESIA: IV REGIONAL FAB   GASTRIC BYPASS  2021   HYSTEROSCOPY WITH D & C  02/21/2006   HYSTEROSCOPY WITH D & C  12/17/2011   Procedure: DILATATION AND CURETTAGE /HYSTEROSCOPY;  Surgeon: Meriel Pica, MD;  Location: [facility];  Service: Gynecology;  Laterality: N/A;   LEEP  01/07/2004   PANNICULECTOMY     03-22-23   TONSILLECTOMY  09/21/2002    Current Outpatient Medications on File Prior to Visit  Medication Sig Dispense Refill   albuterol (PROAIR HFA) 108 (90 Base) MCG/ACT inhaler Inhale 2 puffs into the lungs every 4 (four) hours as needed for wheezing or shortness of breath. 18 g 3   amLODipine (NORVASC) 5 MG tablet Take 1 tablet (5 mg total) by mouth daily. Over for annual appt due must see provider for future refills 90 tablet 3   B Complex-Folic Acid (B  COMPLEX PLUS) TABS Take 1 tablet by mouth daily. 100 tablet 3   Cholecalciferol (VITAMIN D3) 50 MCG (2000 UT) capsule Take 2 capsules (4,000 Units total) by mouth daily. 180 capsule 3   phentermine 30 MG capsule Take 30 mg by mouth daily.     potassium chloride SA (KLOR-CON M) 20 MEQ tablet Take 1 tablet (20 mEq total) by mouth daily. 90 tablet 3   topiramate (TOPAMAX) 50 MG tablet Take 50 mg by mouth daily.     No current facility-administered medications on file prior to visit.    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  Tobacco Use   Smoking status: Never   Smokeless tobacco: Never  Vaping Use   Vaping status: Never Used  Substance and Sexual Activity   Alcohol use: Not Currently   Drug use: Yes    Types: Marijuana    Comment: Edible   Sexual activity: Yes    Partners:  Male    Comment: 1st intercourse- 56, partners- more than 5  Other Topics Concern   Not on file  Social History Narrative   Not on file   Social Determinants of Health   Financial Resource Strain: Not on file  Food Insecurity: Not on file  Transportation Needs: Not on file  Physical Activity: Not on file  Stress: No Stress Concern Present (03/22/2023)   Received from [facility], [facility]   [employer] [facility] - Occupational Stress Questionnaire    Feeling of Stress : Not at all  Social Connections: Unknown (03/22/2023)   Received from [facility], [facility]   Social Network    Social Network: Not on file  Intimate Partner Violence: Unknown (03/22/2023)   Received from [employer], [facility]   HITS    Physically Hurt: Not on file    Insult or Talk Down To: Not on file    Threaten Physical Harm: Not on file    Scream or Curse: Not on file    Family History  Problem Relation Age of Onset   Asthma Mother    Hyperlipidemia Mother    Hypertension Mother    Sleep apnea Mother    Lung cancer Father        Smoker   Cancer Father        lung/ smoker   Heart disease Father    Alcoholism Father    Asthma Maternal Aunt    Cancer Maternal Aunt        liver   Cancer Maternal Aunt    Cancer Maternal Uncle        prostate   Cancer Paternal Uncle    Breast cancer Maternal Grandmother    Cancer Maternal Grandmother        breast   Emphysema Paternal Grandmother    Hypertension Other     No Known Allergies    PE Today's Vitals   10/23/23 1326  BP: 124/82  Pulse: 71  Weight: 226 lb (102.5 kg)  Height: 5' 5.35" (1.66 m)   Body mass index is 37.2 kg/m.  Physical Exam Vitals reviewed. Exam conducted with a chaperone present.  Constitutional:      General: She is not in acute distress.    Appearance: Normal  appearance.  HENT:     Head: Normocephalic and atraumatic.     Nose: Nose normal.  Eyes:     Extraocular Movements:  Extraocular movements intact.     Conjunctiva/sclera: Conjunctivae normal.  Neck:     Thyroid: No thyroid mass, thyromegaly or thyroid tenderness.  Pulmonary:     Effort: Pulmonary effort is normal.  Chest:     Chest wall: No mass or tenderness.  Breasts:    Right: Normal. No swelling, mass, nipple discharge, skin change or tenderness.     Left: Normal. No swelling, mass, nipple discharge, skin change or tenderness.  Abdominal:     General: There is no distension.     Palpations: Abdomen is soft.     Tenderness: There is no abdominal tenderness.  Genitourinary:    General: Normal vulva.     Exam position: Lithotomy position.     Urethra: No prolapse.     Vagina: Normal. No vaginal discharge or bleeding.     Cervix: Normal. No lesion.     Uterus: Normal. Not enlarged and not tender.      Adnexa: Right adnexa normal and left adnexa normal.  Musculoskeletal:        General: Normal range of motion.     Cervical back: Normal range of motion.  Lymphadenopathy:     Upper Body:     Right upper body: No axillary adenopathy.     Left upper body: No axillary adenopathy.     Lower Body: No right inguinal adenopathy. No left inguinal adenopathy.  Skin:    General: Skin is warm and dry.  Neurological:     General: No focal deficit present.     Mental Status: She is alert.  Psychiatric:        Mood and Affect: Mood normal.        Behavior: Behavior normal.      Assessment and Plan:        Well woman exam with routine gynecological exam Assessment & Plan: Cervical cancer screening performed according to ASCCP guidelines. Encouraged annual mammogram screening, number provided  Colonoscopy referral ordered by PCP Labs and immunizations with her primary Declines contraception Encouraged healthy lifestyle practices with diet and exercise For patients under 50yo, I recommend 1000mg  calcium daily and 600IU of vitamin D daily.    Rosalyn Gess, MD

## 2023-11-21 ENCOUNTER — Encounter: Payer: Self-pay | Admitting: Gastroenterology

## 2023-11-21 ENCOUNTER — Ambulatory Visit (AMBULATORY_SURGERY_CENTER): Payer: 59

## 2023-11-21 VITALS — Ht 65.0 in | Wt 225.0 lb

## 2023-11-21 DIAGNOSIS — Z1211 Encounter for screening for malignant neoplasm of colon: Secondary | ICD-10-CM

## 2023-11-21 MED ORDER — NA SULFATE-K SULFATE-MG SULF 17.5-3.13-1.6 GM/177ML PO SOLN: 1.0000 | Freq: Once | ORAL | 0 refills | Status: AC

## 2023-11-21 NOTE — Progress Notes (Signed)
No egg or soy allergy known to patient  No issues known to pt with past sedation with any surgeries or procedures Patient denies ever being told they had issues or difficulty with intubation  No FH of Malignant Hyperthermia Pt is on diet pills - Phentermine 30 mg daily. Pt to hold 10 days prior to colonoscopy. Last dose or before December 16th. Pt is aware failure to hold will result in cancellation of the procedure   Pt is not on  home 02  Pt is not on blood thinners  Pt reports drug induced constipation while taking phentermine  No A fib or A flutter Have any cardiac testing pending-- no  LOA: independent  Prep:   Patient's chart reviewed by Cathlyn Parsons CNRA prior to previsit and patient appropriate for the [facility].  Previsit completed and red dot placed by patient's name on their procedure day (on provider's schedule).     PV competed with patient. Prep instructions sent via mychart and home address. Goodrx coupon for provided to use for price reduction if needed.

## 2023-12-01 ENCOUNTER — Encounter: Payer: Self-pay | Admitting: Certified Registered Nurse Anesthetist

## 2023-12-06 ENCOUNTER — Ambulatory Visit: Payer: 59 | Admitting: Gastroenterology

## 2023-12-06 ENCOUNTER — Encounter: Payer: Self-pay | Admitting: Gastroenterology

## 2023-12-06 VITALS — BP 146/92 | HR 77 | Temp 98.4°F | Resp 21 | Ht 65.5 in | Wt 225.0 lb

## 2023-12-06 DIAGNOSIS — D121 Benign neoplasm of appendix: Secondary | ICD-10-CM

## 2023-12-06 DIAGNOSIS — K648 Other hemorrhoids: Secondary | ICD-10-CM | POA: Diagnosis not present

## 2023-12-06 DIAGNOSIS — K644 Residual hemorrhoidal skin tags: Secondary | ICD-10-CM | POA: Diagnosis not present

## 2023-12-06 DIAGNOSIS — Z1211 Encounter for screening for malignant neoplasm of colon: Secondary | ICD-10-CM

## 2023-12-06 MED ORDER — SODIUM CHLORIDE 0.9 % IV SOLN: 500.0000 mL | Freq: Once | INTRAVENOUS | Status: DC

## 2023-12-06 NOTE — Progress Notes (Signed)
Report given to PACU, vss 

## 2023-12-06 NOTE — Patient Instructions (Addendum)
Repeat colonoscopy in 3 years   YOU HAD AN ENDOSCOPIC PROCEDURE TODAY AT THE [facility]:   Refer to the procedure report that was given to you for any specific questions about what was found during the examination.  If the procedure report does not answer your questions, please call your gastroenterologist to clarify.  If you requested that your care partner not be given the details of your procedure findings, then the procedure report has been included in a sealed envelope for you to review at your convenience later.  YOU SHOULD EXPECT: Some feelings of bloating in the abdomen. Passage of more gas than usual.  Walking can help get rid of the air that was put into your GI tract during the procedure and reduce the bloating. If you had a lower endoscopy (such as a colonoscopy or flexible sigmoidoscopy) you may notice spotting of blood in your stool or on the toilet paper. If you underwent a bowel prep for your procedure, you may not have a normal bowel movement for a few days.  Please Note:  You might notice some irritation and congestion in your nose or some drainage.  This is from the oxygen used during your procedure.  There is no need for concern and it should clear up in a day or so.  SYMPTOMS TO REPORT IMMEDIATELY:  Following lower endoscopy (colonoscopy or flexible sigmoidoscopy):  Excessive amounts of blood in the stool  Significant tenderness or worsening of abdominal pains  Swelling of the abdomen that is new, acute  Fever of 100F or higher  For urgent or emergent issues, a gastroenterologist can be reached at any hour by calling [phone]. Do not use MyChart messaging for urgent concerns.    DIET:  We do recommend a small meal at first, but then you may proceed to your regular diet.  Drink plenty of fluids but you should avoid alcoholic beverages for 24 hours.  ACTIVITY:  You should plan to take it easy for the rest of today and you should NOT DRIVE or use heavy  machinery until tomorrow (because of the sedation medicines used during the test).    FOLLOW UP: Our staff will call the number listed on your records the next business day following your procedure.  We will call around 7:15- 8:00 am to check on you and address any questions or concerns that you may have regarding the information given to you following your procedure. If we do not reach you, we will leave a message.     If any biopsies were taken you will be contacted by phone or by letter within the next 1-3 weeks.  Please call us at [phone] if you have not heard about the biopsies in 3 weeks.    SIGNATURES/CONFIDENTIALITY: You and/or your care partner have signed paperwork which will be entered into your electronic medical record.  These signatures attest to the fact that that the information above on your After Visit Summary has been reviewed and is understood.  Full responsibility of the confidentiality of this discharge information lies with you and/or your care-partner.

## 2023-12-06 NOTE — Progress Notes (Signed)
[facility] History and Physical   Primary Care Physician:  Plotnikov, Georgina Quint, MD   Reason for Procedure:  Colorectal cancer screening  Plan:    Screening colonoscopy with possible interventions as needed     HPI: Kim Barnes is a very pleasant 47 y.o. female here for screening colonoscopy. Denies any nausea, vomiting, abdominal pain, melena or bright red blood per rectum  The risks and benefits as well as alternatives of endoscopic procedure(s) have been discussed and reviewed. All questions answered. The patient agrees to proceed.    Past Medical History:  Diagnosis Date   Anxiety    Asthma    prn inhaler   Back pain    Bell's palsy    Carpal tunnel syndrome of left wrist 01/2013   Chest pain    Cold hands and feet    Cough    Depression    Dry mouth    Dry skin    Fatigue    GERD (gastroesophageal reflux disease)    occasional; no current med.   History of brain tumor 1996   no neurologic deficits   HTN (hypertension)    under control with med., has been on med. x 5 yr.   Joint pain    Obesity    Obstructive sleep apnea    PCOS (polycystic ovarian syndrome)    Polycystic ovarian syndrome    no current med.; irregular periods   Shortness of breath    Stress    Swelling of extremity    Wheezing     Past Surgical History:  Procedure Laterality Date   BRAIN SURGERY  12/10/1994   exc. tumor   CARPAL TUNNEL RELEASE Left 02/10/2013   Procedure: CARPAL TUNNEL RELEASE;  Surgeon: Nicki Reaper, MD;  Location: [facility];  Service: Orthopedics;  Laterality: Left;  ANESTHESIA: IV REGIONAL FAB   GASTRIC BYPASS  2021   HYSTEROSCOPY WITH D & C  02/21/2006   HYSTEROSCOPY WITH D & C  12/17/2011   Procedure: DILATATION AND CURETTAGE /HYSTEROSCOPY;  Surgeon: Meriel Pica, MD;  Location: [facility];  Service: Gynecology;  Laterality: N/A;   LEEP  01/07/2004   PANNICULECTOMY     03-22-23   TONSILLECTOMY  09/21/2002    Prior to  Admission medications   Medication Sig Start Date End Date Taking? Authorizing Provider  albuterol (PROAIR HFA) 108 (90 Base) MCG/ACT inhaler Inhale 2 puffs into the lungs every 4 (four) hours as needed for wheezing or shortness of breath. 12/14/22   Plotnikov, Georgina Quint, MD  amLODipine (NORVASC) 5 MG tablet Take 1 tablet (5 mg total) by mouth daily. Over for annual appt due must see provider for future refills 12/14/22   Plotnikov, Georgina Quint, MD  B Complex-Folic Acid (B COMPLEX PLUS) TABS Take 1 tablet by mouth daily. 08/30/21   Plotnikov, Georgina Quint, MD  Cholecalciferol (VITAMIN D3) 50 MCG (2000 UT) capsule Take 2 capsules (4,000 Units total) by mouth daily. 09/22/23   Plotnikov, Georgina Quint, MD  phentermine 30 MG capsule Take 30 mg by mouth daily. 10/06/23   [provider]  potassium chloride SA (KLOR-CON M) 20 MEQ tablet Take 1 tablet (20 mEq total) by mouth daily. 09/22/23   Plotnikov, Georgina Quint, MD  topiramate (TOPAMAX) 50 MG tablet Take 50 mg by mouth daily. 10/04/23   [provider]    Current Outpatient Medications  Medication Sig Dispense Refill   albuterol (PROAIR HFA) 108 (90 Base) MCG/ACT inhaler Inhale  2 puffs into the lungs every 4 (four) hours as needed for wheezing or shortness of breath. 18 g 3   amLODipine (NORVASC) 5 MG tablet Take 1 tablet (5 mg total) by mouth daily. Over for annual appt due must see provider for future refills 90 tablet 3   B Complex-Folic Acid (B COMPLEX PLUS) TABS Take 1 tablet by mouth daily. 100 tablet 3   Cholecalciferol (VITAMIN D3) 50 MCG (2000 UT) capsule Take 2 capsules (4,000 Units total) by mouth daily. 180 capsule 3   phentermine 30 MG capsule Take 30 mg by mouth daily.     potassium chloride SA (KLOR-CON M) 20 MEQ tablet Take 1 tablet (20 mEq total) by mouth daily. 90 tablet 3   topiramate (TOPAMAX) 50 MG tablet Take 50 mg by mouth daily.     No current facility-administered medications for this visit.    Allergies as of  12/06/2023   (No Known Allergies)    Family History  Problem Relation Age of Onset   Asthma Mother    Hyperlipidemia Mother    Hypertension Mother    Sleep apnea Mother    Lung cancer Father        Smoker   Cancer Father        lung/ smoker   Heart disease Father    Alcoholism Father    Colon cancer Maternal Aunt    Asthma Maternal Aunt    Cancer Maternal Aunt        liver   Cancer Maternal Aunt    Cancer Maternal Uncle        prostate   Cancer Paternal Uncle    Breast cancer Maternal Grandmother    Cancer Maternal Grandmother        breast   Emphysema Paternal Grandmother    Hypertension Other    Colon polyps Neg Hx    Esophageal cancer Neg Hx    Rectal cancer Neg Hx    Stomach cancer Neg Hx     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  Tobacco Use   Smoking status: Never   Smokeless tobacco: Never  Vaping Use   Vaping status: Never Used  Substance and Sexual Activity   Alcohol use: Not Currently   Drug use: Yes    Types: Marijuana    Comment: Edible   Sexual activity: Yes    Partners: Male    Comment: 1st intercourse- 12, partners- more than 5  Other Topics Concern   Not on file  Social History Narrative   Not on file   Social Drivers of Health   Financial Resource Strain: Not on file  Food Insecurity: Not on file  Transportation Needs: Not on file  Physical Activity: Not on file  Stress: No Stress Concern Present (03/22/2023)   Received from [facility], [facility]   [employer] [facility]ational [facility] - Occupational Stress Questionnaire    Feeling of Stress : Not at all  Social Connections: Unknown (03/22/2023)   Received from [facility], [facility]   Social Network    Social Network: Not on file  Intimate Partner Violence:  Unknown (03/22/2023)   Received from [facility], [facility]   HITS    Physically Hurt: Not on  file    Insult or Talk Down To: Not on file    Threaten Physical Harm: Not on file    Scream or Curse: Not on file    Review of Systems:  All other review of systems negative except as mentioned in the HPI.  Physical Exam: Vital signs in last 24 hours: BP (!) 141/99 (BP Location: Left Arm, Patient Position: Sitting, Cuff Size: Small)   Pulse 81   Temp 98.4 F (36.9 C) (Temporal)   Ht 5' 5.5" (1.664 m)   Wt 225 lb (102.1 kg)   LMP 11/28/2023   SpO2 100%   BMI 36.87 kg/m  General:   Alert, NAD Lungs:  Clear .   Heart:  Regular rate and rhythm Abdomen:  Soft, nontender and nondistended. Neuro/Psych:  Alert and cooperative. Normal mood and affect. A and O x 3  Reviewed labs, radiology imaging, old records and pertinent past GI work up  Patient is appropriate for planned procedure(s) and anesthesia in an ambulatory setting   K. Scherry Ran , MD [phone]

## 2023-12-06 NOTE — Op Note (Signed)
[facility] Patient Name: Kim Barnes Procedure Date: 12/06/2023 2:27 PM MRN: [id number] Endoscopist: Napoleon Form , MD, [id number] Age: 47 Referring MD:  Date of Birth: [birth year] Gender: Female Account #: [id number] Procedure:                Colonoscopy Indications:              Screening for colorectal malignant neoplasm Medicines:                Monitored Anesthesia Care Procedure:                Pre-Anesthesia Assessment:                           - Prior to the procedure, a History and Physical                            was performed, and patient medications and                            allergies were reviewed. The patient's tolerance of                            previous anesthesia was also reviewed. The risks                            and benefits of the procedure and the sedation                            options and risks were discussed with the patient.                            All questions were answered, and informed consent                            was obtained. Prior Anticoagulants: The patient has                            taken no anticoagulant or antiplatelet agents. ASA                            Grade Assessment: II - A patient with mild systemic                            disease. After reviewing the risks and benefits,                            the patient was deemed in satisfactory condition to                            undergo the procedure.                           After obtaining informed consent, the colonoscope  was passed under direct vision. Throughout the                            procedure, the patient's blood pressure, pulse, and                            oxygen saturations were monitored continuously. The                            Olympus Scope SN: [id number] was introduced through                            the anus and advanced to the the cecum, identified                            by  appendiceal orifice and ileocecal valve. The                            colonoscopy was performed without difficulty. The                            patient tolerated the procedure well. The quality                            of the bowel preparation was good. The ileocecal                            valve, appendiceal orifice, and rectum were                            photographed. Scope In: 2:56:15 PM Scope Out: 3:15:40 PM Scope Withdrawal Time: 0 hours 10 minutes 18 seconds  Total Procedure Duration: 0 hours 19 minutes 25 seconds  Findings:                 The perianal and digital rectal examinations were                            normal.                           A 10 mm polyp was found in the appendiceal orifice.                            The polyp was sessile. The polyp was removed with a                            cold snare. Resection and retrieval were complete.                           Non-bleeding external and internal hemorrhoids were                            found during retroflexion. The hemorrhoids were  medium-sized. Complications:            No immediate complications. Estimated Blood Loss:     Estimated blood loss was minimal. Impression:               - One 10 mm polyp at the appendiceal orifice,                            removed with a cold snare. Resected and retrieved.                           - Non-bleeding external and internal hemorrhoids. Recommendation:           - Patient has a contact number available for                            emergencies. The signs and symptoms of potential                            delayed complications were discussed with the                            patient. Return to normal activities tomorrow.                            Written discharge instructions were provided to the                            patient.                           - Resume previous diet.                           - Continue  present medications.                           - Await pathology results.                           - Repeat colonoscopy in 3 years for surveillance                            based on pathology results. Napoleon Form, MD 12/06/2023 3:21:28 PM This report has been signed electronically.

## 2023-12-06 NOTE — Progress Notes (Signed)
Called to room to assist during endoscopic procedure.  Patient ID and intended procedure confirmed with present staff. Received instructions for my participation in the procedure from the performing physician.  

## 2023-12-09 ENCOUNTER — Telehealth: Payer: Self-pay

## 2023-12-09 NOTE — Telephone Encounter (Signed)
  Follow up Call-     12/06/2023    2:39 PM 12/06/2023    2:35 PM  Call back number  Post procedure Call Back phone  # [phone]   Permission to leave phone message  Yes     Patient questions:  Do you have a fever, pain , or abdominal swelling? No. Pain Score  0 *  Have you tolerated food without any problems? Yes.    Have you been able to return to your normal activities? Yes.    Do you have any questions about your discharge instructions: Diet   No. Medications  No. Follow up visit  No.  Do you have questions or concerns about your Care? No.  Actions: * If pain score is 4 or above: No action needed, pain <4.

## 2023-12-13 LAB — SURGICAL PATHOLOGY

## 2024-01-07 ENCOUNTER — Encounter: Payer: Self-pay | Admitting: Gastroenterology

## 2024-01-08 ENCOUNTER — Other Ambulatory Visit: Payer: Self-pay | Admitting: Internal Medicine

## 2024-01-08 DIAGNOSIS — I10 Essential (primary) hypertension: Secondary | ICD-10-CM

## 2024-03-18 ENCOUNTER — Ambulatory Visit: Payer: 59 | Admitting: Internal Medicine

## 2024-03-30 ENCOUNTER — Encounter: Payer: Self-pay | Admitting: Internal Medicine

## 2024-03-30 ENCOUNTER — Ambulatory Visit (INDEPENDENT_AMBULATORY_CARE_PROVIDER_SITE_OTHER): Payer: 59 | Admitting: Internal Medicine

## 2024-03-30 VITALS — Ht 65.5 in | Wt 226.8 lb

## 2024-03-30 DIAGNOSIS — I10 Essential (primary) hypertension: Secondary | ICD-10-CM | POA: Diagnosis not present

## 2024-03-30 DIAGNOSIS — J4520 Mild intermittent asthma, uncomplicated: Secondary | ICD-10-CM

## 2024-03-30 DIAGNOSIS — R7303 Prediabetes: Secondary | ICD-10-CM

## 2024-03-30 DIAGNOSIS — E538 Deficiency of other specified B group vitamins: Secondary | ICD-10-CM

## 2024-03-30 DIAGNOSIS — D508 Other iron deficiency anemias: Secondary | ICD-10-CM

## 2024-03-30 LAB — COMPREHENSIVE METABOLIC PANEL WITH GFR
ALT: 13 U/L (ref 0–35)
AST: 19 U/L (ref 0–37)
Albumin: 4.4 g/dL (ref 3.5–5.2)
Alkaline Phosphatase: 87 U/L (ref 39–117)
BUN: 12 mg/dL (ref 6–23)
CO2: 26 meq/L (ref 19–32)
Calcium: 9.4 mg/dL (ref 8.4–10.5)
Chloride: 105 meq/L (ref 96–112)
Creatinine, Ser: 0.94 mg/dL (ref 0.40–1.20)
GFR: 73.13 mL/min (ref >60.00)
Glucose, Bld: 92 mg/dL (ref 70–99)
Potassium: 3.9 meq/L (ref 3.5–5.1)
Sodium: 138 meq/L (ref 135–145)
Total Bilirubin: 0.5 mg/dL (ref 0.2–1.2)
Total Protein: 7.9 g/dL (ref 6.0–8.3)

## 2024-03-30 LAB — URINALYSIS, ROUTINE W REFLEX MICROSCOPIC
Bilirubin Urine: NEGATIVE
Ketones, ur: NEGATIVE
Leukocytes,Ua: NEGATIVE
Nitrite: NEGATIVE
Specific Gravity, Urine: 1.015 (ref 1.000–1.030)
Total Protein, Urine: NEGATIVE
Urine Glucose: NEGATIVE
Urobilinogen, UA: 0.2 (ref 0.0–1.0)
pH: 6 (ref 5.0–8.0)

## 2024-03-30 LAB — VITAMIN B12: Vitamin B-12: 466 pg/mL (ref 211–911)

## 2024-03-30 LAB — LIPID PANEL
Cholesterol: 181 mg/dL (ref 0–200)
HDL: 68.9 mg/dL (ref >39.00)
LDL Cholesterol: 103 mg/dL — ABNORMAL HIGH (ref 0–99)
NonHDL: 111.98
Total CHOL/HDL Ratio: 3
Triglycerides: 44 mg/dL (ref 0.0–149.0)
VLDL: 8.8 mg/dL (ref 0.0–40.0)

## 2024-03-30 LAB — MAGNESIUM: Magnesium: 2.1 mg/dL (ref 1.5–2.5)

## 2024-03-30 LAB — T4, FREE: Free T4: 0.58 ng/dL — ABNORMAL LOW (ref 0.60–1.60)

## 2024-03-30 LAB — TSH: TSH: 1.59 u[IU]/mL (ref 0.35–5.50)

## 2024-03-30 LAB — HEMOGLOBIN A1C: Hgb A1c MFr Bld: 5.6 % (ref 4.6–6.5)

## 2024-03-30 MED ORDER — MONTELUKAST SODIUM 10 MG PO TABS: 10.0000 mg | ORAL_TABLET | Freq: Every day | ORAL | 5 refills | Status: DC

## 2024-03-30 MED ORDER — HYDROCODONE BIT-HOMATROP MBR 5-1.5 MG/5ML PO SOLN: 5.0000 mL | Freq: Three times a day (TID) | ORAL | 0 refills | Status: DC | PRN

## 2024-03-30 MED ORDER — AIRSUPRA 90-80 MCG/ACT IN AERO: 2.0000 | INHALATION_SPRAY | RESPIRATORY_TRACT | 5 refills | Status: DC | PRN

## 2024-03-30 MED ORDER — AMLODIPINE BESYLATE 5 MG PO TABS: 5.0000 mg | ORAL_TABLET | Freq: Every day | ORAL | 3 refills | Status: AC

## 2024-03-30 MED ORDER — METHYLPREDNISOLONE 4 MG PO TBPK: ORAL_TABLET | ORAL | 0 refills | Status: DC

## 2024-03-30 NOTE — Assessment & Plan Note (Signed)
 Doing well

## 2024-03-30 NOTE — Assessment & Plan Note (Signed)
On Amlodipine  Highest wt 396 lbs On Topamax, Phentermine S/p pannus reduction surgery

## 2024-03-30 NOTE — Assessment & Plan Note (Signed)
S/p stomach bypass on 12/06/20 - Dr Lowell Guitar - lost wt from 358 to 217 lbs Highest wt 396 lbs On Topamax, Phentermine S/p pannus reduction surgery

## 2024-03-30 NOTE — Assessment & Plan Note (Signed)
On B complex 

## 2024-03-30 NOTE — Progress Notes (Signed)
 Subjective:  Patient ID: Kim Barnes, female    DOB: 1976/11/02  Age: 48 y.o. MRN: [id number]  CC: Medical Management of Chronic Issues (6 Month follow up. Patient notes of constant cough for several weeks. Believes this is due to asthma/allergy issues)   HPI Kim Barnes presents for cough x 2-3 wks in daytime Using MDI, can't stop coughing      ROS: Review of Systems  Constitutional:  Negative for activity change, appetite change, chills, fatigue and unexpected weight change.  HENT:  Negative for congestion, mouth sores and sinus pressure.   Eyes:  Negative for visual disturbance.  Respiratory:  Positive for cough. Negative for chest tightness and wheezing.   Gastrointestinal:  Negative for abdominal pain and nausea.  Genitourinary:  Negative for difficulty urinating, frequency and vaginal pain.  Musculoskeletal:  Negative for back pain and gait problem.  Skin:  Negative for pallor and rash.  Neurological:  Negative for dizziness, tremors, weakness, numbness and headaches.  Psychiatric/Behavioral:  Negative for confusion, sleep disturbance and suicidal ideas.     Objective:  Ht 5' 5.5" (1.664 m)   Wt 226 lb 12.8 oz (102.9 kg)   BMI 37.17 kg/m   BP Readings from Last 3 Encounters:  12/06/23 (!) 146/92  10/23/23 124/82  09/18/23 130/82    Wt Readings from Last 3 Encounters:  03/30/24 226 lb 12.8 oz (102.9 kg)  12/06/23 225 lb (102.1 kg)  11/21/23 225 lb (102.1 kg)    Physical Exam Constitutional:      General: She is not in acute distress.    Appearance: She is well-developed. She is obese.  HENT:     Head: Normocephalic.     Right Ear: External ear normal.     Left Ear: External ear normal.     Nose: Nose normal.  Eyes:     General:        Right eye: No discharge.        Left eye: No discharge.     Conjunctiva/sclera: Conjunctivae normal.     Pupils: Pupils are equal, round, and reactive to light.  Neck:     Thyroid : No thyromegaly.     Vascular:  No JVD.     Trachea: No tracheal deviation.  Cardiovascular:     Rate and Rhythm: Normal rate and regular rhythm.     Heart sounds: Normal heart sounds.  Pulmonary:     Effort: No respiratory distress.     Breath sounds: No stridor. No wheezing.  Abdominal:     General: Bowel sounds are normal. There is no distension.     Palpations: Abdomen is soft. There is no mass.     Tenderness: There is no abdominal tenderness. There is no guarding or rebound.  Musculoskeletal:        General: No tenderness.     Cervical back: Normal range of motion and neck supple. No rigidity.  Lymphadenopathy:     Cervical: No cervical adenopathy.  Skin:    Findings: No erythema or rash.  Neurological:     Mental Status: She is oriented to person, place, and time.     Cranial Nerves: No cranial nerve deficit.     Motor: No abnormal muscle tone.     Coordination: Coordination normal.     Deep Tendon Reflexes: Reflexes normal.  Psychiatric:        Behavior: Behavior normal.        Thought Content: Thought content normal.  Judgment: Judgment normal.     Lab Results  Component Value Date   WBC 4.6 09/18/2023   HGB 13.1 09/18/2023   HCT 39.9 09/18/2023   PLT 302.0 09/18/2023   GLUCOSE 85 09/18/2023   CHOL 188 08/29/2021   TRIG 75.0 08/29/2021   HDL 54.40 08/29/2021   LDLDIRECT 149.4 05/24/2010   LDLCALC 119 (H) 08/29/2021   ALT 12 09/18/2023   AST 17 09/18/2023   NA 140 09/18/2023   K 3.2 (L) 09/18/2023   CL 103 09/18/2023   CREATININE 0.88 09/18/2023   BUN 9 09/18/2023   CO2 28 09/18/2023   TSH 2.01 09/18/2023   INR 1.16 02/04/2017   HGBA1C 5.4 09/18/2023    No results found.  Assessment & Plan:   Problem List Items Addressed This Visit     Essential hypertension   On Amlodipine   Highest wt 396 lbs On Topamax, Phentermine S/p pannus reduction surgery       Relevant Medications   amLODipine  (NORVASC ) 5 MG tablet   Other Relevant Orders   Comprehensive metabolic panel  with GFR   Hemoglobin A1c   Lipid panel   Magnesium    Urinalysis   Vitamin B12   T4, free   TSH   Asthma - Primary   Worse Try Airsupra  MDI Singulair  - re-start Hycodan prn Medrol  pac      Relevant Medications   methylPREDNISolone  (MEDROL  DOSEPAK) 4 MG TBPK tablet   Albuterol -Budesonide  (AIRSUPRA ) 90-80 MCG/ACT AERO   montelukast  (SINGULAIR ) 10 MG tablet   Other Relevant Orders   Comprehensive metabolic panel with GFR   Hemoglobin A1c   Lipid panel   Magnesium    Urinalysis   Vitamin B12   T4, free   TSH   Prediabetes    S/p stomach bypass on 12/06/20 - Dr Ada Acres - lost wt from 358 to 217 lbs Highest wt 396 lbs On Topamax, Phentermine S/p pannus reduction surgery       Relevant Orders   Comprehensive metabolic panel with GFR   Hemoglobin A1c   Lipid panel   Magnesium    Urinalysis   Vitamin B12   T4, free   TSH   B12 deficiency   On B complex      Relevant Orders   Comprehensive metabolic panel with GFR   Hemoglobin A1c   Lipid panel   Magnesium    Urinalysis   Vitamin B12   T4, free   TSH   Anemia, iron deficiency   S/p gastric bypass On PO FeSO4, MVI         Meds ordered this encounter  Medications   methylPREDNISolone  (MEDROL  DOSEPAK) 4 MG TBPK tablet    Sig: As directed    Dispense:  21 tablet    Refill:  0   Albuterol -Budesonide  (AIRSUPRA ) 90-80 MCG/ACT AERO    Sig: Inhale 2 Inhalations into the lungs every 4 (four) hours as needed.    Dispense:  10 g    Refill:  5   montelukast  (SINGULAIR ) 10 MG tablet    Sig: Take 1 tablet (10 mg total) by mouth at bedtime.    Dispense:  30 tablet    Refill:  5   HYDROcodone  bit-homatropine (HYCODAN) 5-1.5 MG/5ML syrup    Sig: Take 5 mLs by mouth every 8 (eight) hours as needed for cough.    Dispense:  240 mL    Refill:  0   amLODipine  (NORVASC ) 5 MG tablet    Sig: Take 1 tablet (5 mg total)  by mouth daily.    Dispense:  90 tablet    Refill:  3      Follow-up: Return in about 6 months  (around 09/29/2024) for Wellness Exam.  Anitra Barn, MD

## 2024-03-30 NOTE — Assessment & Plan Note (Signed)
 S/p gastric bypass On PO FeSO4, MVI

## 2024-03-30 NOTE — Assessment & Plan Note (Signed)
 Worse Try Airsupra  MDI Singulair  - re-start Hycodan prn Medrol  pac

## 2024-03-31 ENCOUNTER — Encounter: Payer: Self-pay | Admitting: Internal Medicine

## 2024-03-31 ENCOUNTER — Other Ambulatory Visit: Payer: Self-pay | Admitting: Internal Medicine

## 2024-03-31 DIAGNOSIS — E039 Hypothyroidism, unspecified: Secondary | ICD-10-CM | POA: Insufficient documentation

## 2024-03-31 MED ORDER — LEVOTHYROXINE SODIUM 25 MCG PO TABS: 25.0000 ug | ORAL_TABLET | Freq: Every day | ORAL | 5 refills | Status: DC

## 2024-03-31 NOTE — Assessment & Plan Note (Signed)
 New decreased free T4, normal TSH We started Unithroid  25 mcg daily

## 2024-06-12 ENCOUNTER — Other Ambulatory Visit: Payer: Self-pay | Admitting: Internal Medicine

## 2024-06-15 ENCOUNTER — Ambulatory Visit (INDEPENDENT_AMBULATORY_CARE_PROVIDER_SITE_OTHER): Admitting: Internal Medicine

## 2024-06-15 ENCOUNTER — Encounter: Payer: Self-pay | Admitting: Internal Medicine

## 2024-06-15 VITALS — BP 118/78 | HR 84 | Temp 98.2°F | Ht 65.5 in | Wt 231.0 lb

## 2024-06-15 DIAGNOSIS — E039 Hypothyroidism, unspecified: Secondary | ICD-10-CM | POA: Diagnosis not present

## 2024-06-15 DIAGNOSIS — I10 Essential (primary) hypertension: Secondary | ICD-10-CM

## 2024-06-15 DIAGNOSIS — E538 Deficiency of other specified B group vitamins: Secondary | ICD-10-CM

## 2024-06-15 DIAGNOSIS — J4520 Mild intermittent asthma, uncomplicated: Secondary | ICD-10-CM

## 2024-06-15 LAB — COMPREHENSIVE METABOLIC PANEL WITH GFR
ALT: 12 U/L (ref 0–35)
AST: 19 U/L (ref 0–37)
Albumin: 4.2 g/dL (ref 3.5–5.2)
Alkaline Phosphatase: 81 U/L (ref 39–117)
BUN: 11 mg/dL (ref 6–23)
CO2: 29 meq/L (ref 19–32)
Calcium: 9.4 mg/dL (ref 8.4–10.5)
Chloride: 102 meq/L (ref 96–112)
Creatinine, Ser: 0.84 mg/dL (ref 0.40–1.20)
GFR: 83.58 mL/min (ref >60.00)
Glucose, Bld: 81 mg/dL (ref 70–99)
Potassium: 3.2 meq/L — ABNORMAL LOW (ref 3.5–5.1)
Sodium: 138 meq/L (ref 135–145)
Total Bilirubin: 0.6 mg/dL (ref 0.2–1.2)
Total Protein: 7.7 g/dL (ref 6.0–8.3)

## 2024-06-15 LAB — TSH: TSH: 1.31 u[IU]/mL (ref 0.35–5.50)

## 2024-06-15 LAB — T4, FREE: Free T4: 0.7 ng/dL (ref 0.60–1.60)

## 2024-06-15 NOTE — Assessment & Plan Note (Signed)
Cough resolved 

## 2024-06-15 NOTE — Progress Notes (Signed)
 Subjective:  Patient ID: Kim Barnes, female    DOB: [birth year]  Age: 48 y.o. MRN: [id number]  CC: Medical Management of Chronic Issues (3 mnth f/u)   HPI Kim Barnes presents for HTN, cough, hypothyroidism  Outpatient Medications Prior to Visit  Medication Sig Dispense Refill   albuterol  (PROAIR  HFA) 108 (90 Base) MCG/ACT inhaler Inhale 2 puffs into the lungs every 4 (four) hours as needed for wheezing or shortness of breath. 18 g 3   Albuterol -Budesonide  (AIRSUPRA ) 90-80 MCG/ACT AERO INHALE 2 PUFFS INTO LUNGS EVERY 4 HOURS AS NEEDED 10.7 g 5   amLODipine  (NORVASC ) 5 MG tablet Take 1 tablet (5 mg total) by mouth daily. 90 tablet 3   B Complex-Folic Acid (B COMPLEX PLUS) TABS Take 1 tablet by mouth daily. 100 tablet 3   Cholecalciferol (VITAMIN D3) 50 MCG (2000 UT) capsule Take 2 capsules (4,000 Units total) by mouth daily. 180 capsule 3   HYDROcodone  bit-homatropine (HYCODAN) 5-1.5 MG/5ML syrup Take 5 mLs by mouth every 8 (eight) hours as needed for cough. 240 mL 0   levothyroxine  (SYNTHROID ) 25 MCG tablet Take 1 tablet (25 mcg total) by mouth daily. 30 tablet 5   montelukast  (SINGULAIR ) 10 MG tablet Take 1 tablet (10 mg total) by mouth at bedtime. 30 tablet 5   phentermine 30 MG capsule Take 30 mg by mouth daily.     potassium chloride  SA (KLOR-CON  M) 20 MEQ tablet Take 1 tablet (20 mEq total) by mouth daily. 90 tablet 3   topiramate (TOPAMAX) 50 MG tablet Take 50 mg by mouth daily.     methylPREDNISolone  (MEDROL  DOSEPAK) 4 MG TBPK tablet As directed (Patient not taking: Reported on 06/15/2024) 21 tablet 0   No facility-administered medications prior to visit.    ROS: Review of Systems  Constitutional:  Negative for activity change, appetite change, chills, fatigue and unexpected weight change.  HENT:  Negative for congestion, mouth sores and sinus pressure.   Eyes:  Negative for visual disturbance.  Respiratory:  Negative for cough and chest tightness.   Gastrointestinal:   Negative for abdominal pain and nausea.  Genitourinary:  Negative for difficulty urinating, frequency and vaginal pain.  Musculoskeletal:  Negative for back pain and gait problem.  Skin:  Negative for pallor and rash.  Neurological:  Negative for dizziness, tremors, weakness, numbness and headaches.  Psychiatric/Behavioral:  Negative for confusion and sleep disturbance.     Objective:  BP 118/78   Pulse 84   Temp 98.2 F (36.8 C) (Oral)   Ht 5' 5.5 (1.664 m)   Wt 231 lb (104.8 kg)   SpO2 99%   BMI 37.86 kg/m   BP Readings from Last 3 Encounters:  06/15/24 118/78  12/06/23 (!) 146/92  10/23/23 124/82    Wt Readings from Last 3 Encounters:  06/15/24 231 lb (104.8 kg)  03/30/24 226 lb 12.8 oz (102.9 kg)  12/06/23 225 lb (102.1 kg)    Physical Exam Constitutional:      General: She is not in acute distress.    Appearance: She is well-developed.  HENT:     Head: Normocephalic.     Right Ear: External ear normal.     Left Ear: External ear normal.     Nose: Nose normal.  Eyes:     General:        Right eye: No discharge.        Left eye: No discharge.     Conjunctiva/sclera: Conjunctivae normal.  Pupils: Pupils are equal, round, and reactive to light.  Neck:     Thyroid : No thyromegaly.     Vascular: No JVD.     Trachea: No tracheal deviation.  Cardiovascular:     Rate and Rhythm: Normal rate and regular rhythm.     Heart sounds: Normal heart sounds.  Pulmonary:     Effort: No respiratory distress.     Breath sounds: No stridor. No wheezing.  Abdominal:     General: Bowel sounds are normal. There is no distension.     Palpations: Abdomen is soft. There is no mass.     Tenderness: There is no abdominal tenderness. There is no guarding or rebound.  Musculoskeletal:        General: No tenderness.     Cervical back: Normal range of motion and neck supple. No rigidity.  Lymphadenopathy:     Cervical: No cervical adenopathy.  Skin:    Findings: No erythema or  rash.  Neurological:     Cranial Nerves: No cranial nerve deficit.     Motor: No abnormal muscle tone.     Coordination: Coordination normal.     Deep Tendon Reflexes: Reflexes normal.  Psychiatric:        Behavior: Behavior normal.        Thought Content: Thought content normal.        Judgment: Judgment normal.     Lab Results  Component Value Date   WBC 4.6 09/18/2023   HGB 13.1 09/18/2023   HCT 39.9 09/18/2023   PLT 302.0 09/18/2023   GLUCOSE 92 03/30/2024   CHOL 181 03/30/2024   TRIG 44.0 03/30/2024   HDL 68.90 03/30/2024   LDLDIRECT 149.4 05/24/2010   LDLCALC 103 (H) 03/30/2024   ALT 13 03/30/2024   AST 19 03/30/2024   NA 138 03/30/2024   K 3.9 03/30/2024   CL 105 03/30/2024   CREATININE 0.94 03/30/2024   BUN 12 03/30/2024   CO2 26 03/30/2024   TSH 1.59 03/30/2024   INR 1.16 02/04/2017   HGBA1C 5.6 03/30/2024    No results found.  Assessment & Plan:   Problem List Items Addressed This Visit     Essential hypertension - Primary   On Amlodipine   Highest wt 396 lbs On Topamax, Phentermine S/p pannus reduction surgery       Asthma   Cough resolved      B12 deficiency   On B12      Hypothyroidism (acquired)   Doing well Labs today         No orders of the defined types were placed in this encounter.     Follow-up: Return in about 6 months (around 12/16/2024) for Wellness Exam.  Marolyn Noel, MD

## 2024-06-15 NOTE — Assessment & Plan Note (Signed)
 On B12

## 2024-06-15 NOTE — Assessment & Plan Note (Signed)
On Amlodipine  Highest wt 396 lbs On Topamax, Phentermine S/p pannus reduction surgery

## 2024-06-15 NOTE — Assessment & Plan Note (Signed)
 Doing well Labs today

## 2024-06-16 ENCOUNTER — Ambulatory Visit: Payer: Self-pay | Admitting: Internal Medicine

## 2024-06-16 MED ORDER — POTASSIUM CHLORIDE CRYS ER 20 MEQ PO TBCR: 20.0000 meq | EXTENDED_RELEASE_TABLET | Freq: Every day | ORAL | 3 refills | Status: DC

## 2024-09-29 ENCOUNTER — Ambulatory Visit: Admitting: Internal Medicine

## 2024-09-30 ENCOUNTER — Other Ambulatory Visit: Payer: Self-pay | Admitting: Internal Medicine

## 2024-09-30 DIAGNOSIS — E039 Hypothyroidism, unspecified: Secondary | ICD-10-CM

## 2024-10-06 ENCOUNTER — Ambulatory Visit (INDEPENDENT_AMBULATORY_CARE_PROVIDER_SITE_OTHER): Admitting: Internal Medicine

## 2024-10-06 ENCOUNTER — Encounter: Payer: Self-pay | Admitting: Internal Medicine

## 2024-10-06 VITALS — BP 144/82 | Temp 98.2°F | Ht 65.5 in | Wt 239.6 lb

## 2024-10-06 DIAGNOSIS — Z0000 Encounter for general adult medical examination without abnormal findings: Secondary | ICD-10-CM

## 2024-10-06 DIAGNOSIS — Z6841 Body Mass Index (BMI) 40.0 and over, adult: Secondary | ICD-10-CM

## 2024-10-06 DIAGNOSIS — E559 Vitamin D deficiency, unspecified: Secondary | ICD-10-CM

## 2024-10-06 DIAGNOSIS — J4520 Mild intermittent asthma, uncomplicated: Secondary | ICD-10-CM

## 2024-10-06 DIAGNOSIS — E66813 Obesity, class 3: Secondary | ICD-10-CM | POA: Diagnosis not present

## 2024-10-06 DIAGNOSIS — E538 Deficiency of other specified B group vitamins: Secondary | ICD-10-CM

## 2024-10-06 DIAGNOSIS — E876 Hypokalemia: Secondary | ICD-10-CM

## 2024-10-06 DIAGNOSIS — I10 Essential (primary) hypertension: Secondary | ICD-10-CM

## 2024-10-06 MED ORDER — POTASSIUM CHLORIDE ER 10 MEQ PO TBCR: 10.0000 meq | EXTENDED_RELEASE_TABLET | Freq: Three times a day (TID) | ORAL | 11 refills | Status: AC

## 2024-10-06 NOTE — Progress Notes (Signed)
 Subjective:  Patient ID: Kim Barnes, female    DOB: [birth year]  Age: 48 y.o. MRN: [id number]  CC: Medical Management of Chronic Issues (6 Month follow up)   HPI Kim Barnes presents for HTN, asthma, hypothyroidism Not taking KCL - pills are too big  Outpatient Medications Prior to Visit  Medication Sig Dispense Refill   albuterol  (PROAIR  HFA) 108 (90 Base) MCG/ACT inhaler Inhale 2 puffs into the lungs every 4 (four) hours as needed for wheezing or shortness of breath. 18 g 3   Albuterol -Budesonide  (AIRSUPRA ) 90-80 MCG/ACT AERO INHALE 2 PUFFS INTO LUNGS EVERY 4 HOURS AS NEEDED 10.7 g 5   amLODipine  (NORVASC ) 5 MG tablet Take 1 tablet (5 mg total) by mouth daily. 90 tablet 3   B Complex-Folic Acid (B COMPLEX PLUS) TABS Take 1 tablet by mouth daily. 100 tablet 3   Cholecalciferol (VITAMIN D3) 50 MCG (2000 UT) capsule Take 2 capsules (4,000 Units total) by mouth daily. 180 capsule 3   levothyroxine  (SYNTHROID ) 25 MCG tablet TAKE 1 TABLET BY MOUTH DAILY 30 tablet 5   montelukast  (SINGULAIR ) 10 MG tablet TAKE 1 TABLET BY MOUTH AT BEDTIME 30 tablet 5   phentermine 30 MG capsule Take 30 mg by mouth daily.     topiramate (TOPAMAX) 50 MG tablet Take 50 mg by mouth daily.     potassium chloride  SA (KLOR-CON  M) 20 MEQ tablet Take 1 tablet (20 mEq total) by mouth daily. 90 tablet 3   No facility-administered medications prior to visit.    ROS: Review of Systems  Constitutional:  Negative for activity change, appetite change, chills, fatigue and unexpected weight change.  HENT:  Negative for congestion, mouth sores and sinus pressure.   Eyes:  Negative for visual disturbance.  Respiratory:  Negative for cough and chest tightness.   Gastrointestinal:  Negative for abdominal pain and nausea.  Genitourinary:  Negative for difficulty urinating, frequency and vaginal pain.  Musculoskeletal:  Negative for back pain and gait problem.  Skin:  Negative for pallor and rash.  Neurological:   Negative for dizziness, tremors, weakness, numbness and headaches.  Psychiatric/Behavioral:  Negative for confusion, sleep disturbance and suicidal ideas.     Objective:  BP (!) 144/82   Temp 98.2 F (36.8 C)   Ht 5' 5.5 (1.664 m)   Wt 239 lb 9.6 oz (108.7 kg)   BMI 39.27 kg/m   BP Readings from Last 3 Encounters:  10/06/24 (!) 144/82  06/15/24 118/78  12/06/23 (!) 146/92    Wt Readings from Last 3 Encounters:  10/06/24 239 lb 9.6 oz (108.7 kg)  06/15/24 231 lb (104.8 kg)  03/30/24 226 lb 12.8 oz (102.9 kg)    Physical Exam Constitutional:      General: She is not in acute distress.    Appearance: She is well-developed.  HENT:     Head: Normocephalic.     Right Ear: External ear normal.     Left Ear: External ear normal.     Nose: Nose normal.  Eyes:     General:        Right eye: No discharge.        Left eye: No discharge.     Conjunctiva/sclera: Conjunctivae normal.     Pupils: Pupils are equal, round, and reactive to light.  Neck:     Thyroid : No thyromegaly.     Vascular: No JVD.     Trachea: No tracheal deviation.  Cardiovascular:     Rate  and Rhythm: Normal rate and regular rhythm.     Heart sounds: Normal heart sounds.  Pulmonary:     Effort: No respiratory distress.     Breath sounds: No stridor. No wheezing.  Abdominal:     General: Bowel sounds are normal. There is no distension.     Palpations: Abdomen is soft. There is no mass.     Tenderness: There is no abdominal tenderness. There is no guarding or rebound.  Musculoskeletal:        General: No tenderness.     Cervical back: Normal range of motion and neck supple. No rigidity.  Lymphadenopathy:     Cervical: No cervical adenopathy.  Skin:    Findings: No erythema or rash.  Neurological:     Cranial Nerves: No cranial nerve deficit.     Motor: No abnormal muscle tone.     Coordination: Coordination normal.     Deep Tendon Reflexes: Reflexes normal.  Psychiatric:        Behavior:  Behavior normal.        Thought Content: Thought content normal.        Judgment: Judgment normal.     Lab Results  Component Value Date   WBC 4.6 09/18/2023   HGB 13.1 09/18/2023   HCT 39.9 09/18/2023   PLT 302.0 09/18/2023   GLUCOSE 81 06/15/2024   CHOL 181 03/30/2024   TRIG 44.0 03/30/2024   HDL 68.90 03/30/2024   LDLDIRECT 149.4 05/24/2010   LDLCALC 103 (H) 03/30/2024   ALT 12 06/15/2024   AST 19 06/15/2024   NA 138 06/15/2024   K 3.2 (L) 06/15/2024   CL 102 06/15/2024   CREATININE 0.84 06/15/2024   BUN 11 06/15/2024   CO2 29 06/15/2024   TSH 1.31 06/15/2024   INR 1.16 02/04/2017   HGBA1C 5.6 03/30/2024    No results found.  Assessment & Plan:   Problem List Items Addressed This Visit     Asthma   Cough resolved Ventolin  MDI prn  Refused shots      B12 deficiency - Primary   On B12      Relevant Orders   Vitamin B12   Class 3 severe obesity with serious comorbidity and body mass index (BMI) of 60.0 to 69.9 in adult [facility])   S/p stomach bypass on 12/06/20 - Dr Perri - lost wt from 358 to 217 lbs S/p pannus reduction surgery       Essential hypertension   On Amlodipine        Hypokalemia   Not taking KCL - 20 meq pills are too big Rx - Klor-con  10 meq tabs tid      Relevant Orders   Comprehensive metabolic panel with GFR   Vitamin D  deficiency   Check Vit D      Relevant Orders   VITAMIN D  25 Hydroxy (Vit-D Deficiency, Fractures)   Well adult exam   Relevant Orders   TSH   Urinalysis   CBC with Differential/Platelet   Lipid panel   Comprehensive metabolic panel with GFR   T4, free   Vitamin B12   VITAMIN D  25 Hydroxy (Vit-D Deficiency, Fractures)      Meds ordered this encounter  Medications   potassium chloride  (KLOR-CON  10) 10 MEQ tablet    Sig: Take 1 tablet (10 mEq total) by mouth 3 (three) times daily.    Dispense:  90 tablet    Refill:  11    The smallest tablet  Follow-up: Return in about 4 months (around  02/06/2025) for Wellness Exam.  Marolyn Noel, MD

## 2024-10-06 NOTE — Assessment & Plan Note (Signed)
 On B12

## 2024-10-06 NOTE — Assessment & Plan Note (Signed)
 S/p stomach bypass on 12/06/20 - Dr Perri - lost wt from 358 to 217 lbs S/p pannus reduction surgery

## 2024-10-06 NOTE — Assessment & Plan Note (Signed)
 On Amlodipine

## 2024-10-06 NOTE — Assessment & Plan Note (Signed)
 Check Vit D

## 2024-10-06 NOTE — Assessment & Plan Note (Addendum)
 Cough resolved Ventolin  MDI prn  Refused shots

## 2024-10-06 NOTE — Assessment & Plan Note (Signed)
 Not taking KCL - 20 meq pills are too big Rx - Klor-con  10 meq tabs tid

## 2024-10-24 LAB — HM MAMMOGRAPHY

## 2024-10-26 ENCOUNTER — Ambulatory Visit: Payer: 59 | Admitting: Obstetrics and Gynecology

## 2024-11-11 ENCOUNTER — Encounter: Payer: Self-pay | Admitting: Obstetrics and Gynecology

## 2024-11-11 ENCOUNTER — Ambulatory Visit (INDEPENDENT_AMBULATORY_CARE_PROVIDER_SITE_OTHER): Admitting: Obstetrics and Gynecology

## 2024-11-11 VITALS — BP 116/64 | HR 83 | Temp 98.2°F | Ht 66.5 in | Wt 239.0 lb

## 2024-11-11 DIAGNOSIS — Z113 Encounter for screening for infections with a predominantly sexual mode of transmission: Secondary | ICD-10-CM

## 2024-11-11 DIAGNOSIS — Z01419 Encounter for gynecological examination (general) (routine) without abnormal findings: Secondary | ICD-10-CM | POA: Diagnosis not present

## 2024-11-11 DIAGNOSIS — Z1331 Encounter for screening for depression: Secondary | ICD-10-CM | POA: Diagnosis not present

## 2024-11-11 NOTE — Progress Notes (Signed)
 48 y.o. [id number] female with cHTN, s/p bariatric surgery (2021) here for annual exam. Married, 2025. Works at [employer] from home. PCP: Plotnikov, Karlynn GAILS, MD  Patient's last menstrual period was 11/07/2024 (exact date). Period Duration (Days): 3 Period Pattern: Regular Menstrual Flow: Moderate Menstrual Control: Thin pad, Tampon Dysmenorrhea: None  She reports Recent outbreak (Nov) of herpes, treated with valtrex  by virtual visit . Desires STI testing  Abnormal bleeding: none Pelvic discharge or pain: none Breast mass, nipple discharge or skin changes : none Birth control: none  Last PAP:     Component Value Date/Time   DIAGPAP  10/19/2022 0937    - Negative for intraepithelial lesion or malignancy (NILM)   ADEQPAP  10/19/2022 0937    Satisfactory for evaluation; transformation zone component PRESENT.   Last mammogram: 10/24/24 birads 1, density B  Last colonoscopy: 12/06/23, +polyp, q3y   Sexually active: yes  Exercising: none Smoker: never  [employer] Visit from 11/11/2024 in [facility]  PHQ-2 Total Score 0   Flowsheet Row Office Visit from 02/26/2018 in [facility] Total Score 13   GYN HISTORY: No significant history  OB History  Gravida Para Term Preterm AB Living  3    3 0  SAB IAB Ectopic Multiple Live Births  2 1   0    # Outcome Date GA Lbr Len/2nd Weight Sex Type Anes PTL Lv  3 SAB           2 SAB           1 IAB             Past Medical History:  Diagnosis Date   Anxiety    Asthma    prn inhaler   Back pain    Bell's palsy    Carpal tunnel syndrome of left wrist 01/2013   Chest pain    Cold hands and feet    Cough    Depression    Dry mouth    Dry skin    Fatigue    GERD (gastroesophageal reflux disease)    occasional; no current med.   History of brain tumor 1996   no neurologic deficits   HTN (hypertension)    under control with  med., has been on med. x 5 yr.   Hyperthyroidism    Joint pain    Obesity    Obstructive sleep apnea    PCOS (polycystic ovarian syndrome)    Polycystic ovarian syndrome    no current med.; irregular periods   Shortness of breath    Stress    Swelling of extremity    Wheezing     Past Surgical History:  Procedure Laterality Date   BRAIN SURGERY  12/10/1994   exc. tumor   CARPAL TUNNEL RELEASE Left 02/10/2013   Procedure: CARPAL TUNNEL RELEASE;  Surgeon: Arley JONELLE Curia, MD;  Location: [facility];  Service: Orthopedics;  Laterality: Left;  ANESTHESIA: IV REGIONAL FAB   GASTRIC BYPASS  2021   HYSTEROSCOPY WITH D & C  02/21/2006   HYSTEROSCOPY WITH D & C  12/17/2011   Procedure: DILATATION AND CURETTAGE /HYSTEROSCOPY;  Surgeon: Charlie CHRISTELLA Croak, MD;  Location: [facility];  Service: Gynecology;  Laterality: N/A;   LEEP  01/07/2004   PANNICULECTOMY     03-22-23   TONSILLECTOMY  09/21/2002    Current Outpatient Medications on File Prior to Visit  Medication Sig Dispense Refill   albuterol  (PROAIR  HFA) 108 (90 Base) MCG/ACT inhaler Inhale 2 puffs into the lungs every 4 (four) hours as needed for wheezing or shortness of breath. 18 g 3   Albuterol -Budesonide  (AIRSUPRA ) 90-80 MCG/ACT AERO INHALE 2 PUFFS INTO LUNGS EVERY 4 HOURS AS NEEDED 10.7 g 5   amLODipine  (NORVASC ) 5 MG tablet Take 1 tablet (5 mg total) by mouth daily. 90 tablet 3   B Complex-Folic Acid (B COMPLEX PLUS) TABS Take 1 tablet by mouth daily. 100 tablet 3   Cholecalciferol (VITAMIN D3) 50 MCG (2000 UT) capsule Take 2 capsules (4,000 Units total) by mouth daily. 180 capsule 3   levothyroxine  (SYNTHROID ) 25 MCG tablet TAKE 1 TABLET BY MOUTH DAILY 30 tablet 5   montelukast  (SINGULAIR ) 10 MG tablet TAKE 1 TABLET BY MOUTH AT BEDTIME 30 tablet 5   phentermine 30 MG capsule Take 30 mg by mouth daily.     potassium chloride  (KLOR-CON  10) 10 MEQ tablet Take 1 tablet (10 mEq total) by mouth 3 (three) times daily. 90  tablet 11   topiramate (TOPAMAX) 50 MG tablet Take 50 mg by mouth daily.     No current facility-administered medications on file prior to visit.    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  Tobacco Use   Smoking status: Never   Smokeless tobacco: Never  Vaping Use   Vaping status: Never Used  Substance and Sexual Activity   Alcohol use: Not Currently   Drug use: Yes    Types: Marijuana    Comment: Edible   Sexual activity: Yes    Partners: Male    Comment: 1st intercourse- 55, partners- more than 5  Other Topics Concern   Not on file  Social History Narrative   Not on file   Social Drivers of Health   Financial Resource Strain: Not on file  Food Insecurity: Not on file  Transportation Needs: Not on file  Physical Activity: Not on file  Stress: No Stress Concern Present (03/22/2023)   Received from [facility] - Occupational Stress Questionnaire    Feeling of Stress : Not at all  Social Connections: Unknown (01/08/2023)   Received from [facility]   Social Network    Social Network: Not on file  Intimate Partner Violence: Unknown (01/08/2023)   Received from [facility]   HITS    Physically Hurt: Not on file    Insult or Talk Down To: Not on file    Threaten Physical Harm: Not on file    Scream or Curse: Not on file    Family History  Problem Relation Age of Onset   Asthma Mother    Hyperlipidemia Mother    Hypertension Mother    Sleep apnea Mother    Lung cancer Father        Smoker   Cancer Father        lung/ smoker   Heart disease Father    Alcoholism Father    Colon cancer Maternal Aunt    Asthma Maternal Aunt    Cancer Maternal Aunt        liver   Cancer Maternal Aunt    Cancer Maternal Uncle  prostate   Cancer Paternal Uncle    Breast cancer Maternal Grandmother    Cancer Maternal Grandmother        breast   Emphysema Paternal Grandmother    Hypertension Other    Colon polyps Neg Hx    Esophageal cancer Neg Hx    Rectal cancer Neg Hx    Stomach cancer Neg Hx     Allergies  Allergen Reactions   No Known Allergies       PE Today's Vitals   11/11/24 1035  BP: 116/64  Pulse: 83  Temp: 98.2 F (36.8 C)  TempSrc: Oral  Weight: 239 lb (108.4 kg)  Height: 5' 6.5 (1.689 m)   Body mass index is 38 kg/m.  Physical Exam Vitals reviewed. Exam conducted with a chaperone present.  Constitutional:      General: She is not in acute distress.    Appearance: Normal appearance.  HENT:     Head: Normocephalic and atraumatic.     Nose: Nose normal.  Eyes:     Extraocular Movements: Extraocular movements intact.     Conjunctiva/sclera: Conjunctivae normal.  Pulmonary:     Effort: Pulmonary effort is normal.  Chest:     Chest wall: No mass or tenderness.  Breasts:    Right: Normal. No swelling, mass, nipple discharge, skin change or tenderness.     Left: Normal. No swelling, mass, nipple discharge, skin change or tenderness.  Abdominal:     General: There is no distension.     Palpations: Abdomen is soft.     Tenderness: There is no abdominal tenderness.  Genitourinary:    General: Normal vulva.     Exam position: Lithotomy position.     Urethra: No prolapse.     Vagina: Normal. No vaginal discharge or bleeding.     Cervix: Normal. No lesion.     Uterus: Normal. Not enlarged and not tender.      Adnexa: Right adnexa normal and left adnexa normal.  Musculoskeletal:        General: Normal range of motion.     Cervical back: Normal range of motion.  Lymphadenopathy:     Upper Body:     Right upper body: No axillary adenopathy.     Left upper body: No axillary adenopathy.     Lower Body: No right inguinal adenopathy. No left inguinal adenopathy.  Skin:     General: Skin is warm and dry.  Neurological:     General: No focal deficit present.     Mental Status: She is alert.  Psychiatric:        Mood and Affect: Mood normal.        Behavior: Behavior normal.      Assessment and Plan:        Well woman exam with routine gynecological exam Assessment & Plan: Cervical cancer screening performed according to ASCCP guidelines. Encouraged annual mammogram screening Labs and immunizations with her primary Declines contraception Encouraged healthy lifestyle practices with diet and exercise For patients under 50yo, I recommend 1000mg  calcium daily and 600IU of vitamin D  daily.    Screen for STD (sexually transmitted disease) -     Hepatitis B surface antigen -     Hepatitis C antibody -     RPR W/RFLX TO RPR TITER, TREPONEMAL AB, SCREEN AND DIAGNOSIS -     HIV Antibody (routine testing w rflx) -     SURESWAB CT/NG/T. vaginalis -     HSV(herpes simplex vrs) 1+2  ab-IgG  Negative depression screening   Vera LULLA Pa, MD

## 2024-11-11 NOTE — Assessment & Plan Note (Signed)
 Cervical cancer screening performed according to ASCCP guidelines. Encouraged annual mammogram screening Labs and immunizations with her primary Declines contraception Encouraged healthy lifestyle practices with diet and exercise For patients under 46yo, I recommend 1000mg  calcium daily and 600IU of vitamin D  daily.

## 2024-11-11 NOTE — Patient Instructions (Signed)

## 2024-11-11 NOTE — Addendum Note (Signed)
 Addended by: DALLIE BOLLARD V on: 11/11/2024 12:56 PM   Modules accepted: Orders

## 2024-11-12 LAB — SURESWAB CT/NG/T. VAGINALIS
C. trachomatis RNA, TMA: NOT DETECTED
N. gonorrhoeae RNA, TMA: NOT DETECTED
Trichomonas vaginalis RNA: NOT DETECTED

## 2024-11-12 LAB — HERPES SIMPLEX VIRUS 1 AND 2 (IGG),REFLEX HSV-2 INHIBITION
HSV 1 IGG,TYPE SPECIFIC AB: <0.9 {index}
HSV 2 IGG,TYPE SPECIFIC AB: >23 {index} — ABNORMAL HIGH

## 2024-11-13 ENCOUNTER — Ambulatory Visit: Payer: Self-pay | Admitting: Obstetrics and Gynecology

## 2024-11-13 DIAGNOSIS — A6004 Herpesviral vulvovaginitis: Secondary | ICD-10-CM | POA: Insufficient documentation

## 2024-11-13 LAB — HEPATITIS B SURFACE ANTIGEN: Hepatitis B Surface Ag: NONREACTIVE

## 2024-11-13 LAB — HIV ANTIBODY (ROUTINE TESTING W REFLEX)
HIV 1&2 Ab, 4th Generation: NONREACTIVE
HIV FINAL INTERPRETATION: NEGATIVE

## 2024-11-13 LAB — SYPHILIS: RPR W/REFLEX TO RPR TITER AND TREPONEMAL ANTIBODIES, TRADITIONAL SCREENING AND DIAGNOSIS ALGORITHM: RPR Ser Ql: NONREACTIVE

## 2024-11-13 LAB — HEPATITIS C ANTIBODY: Hepatitis C Ab: NONREACTIVE

## 2024-11-13 MED ORDER — VALACYCLOVIR HCL 500 MG PO TABS: 500.0000 mg | ORAL_TABLET | Freq: Two times a day (BID) | ORAL | 3 refills | Status: AC

## 2024-12-16 ENCOUNTER — Encounter: Payer: Self-pay | Admitting: Internal Medicine

## 2024-12-16 ENCOUNTER — Ambulatory Visit: Admitting: Internal Medicine

## 2024-12-16 VITALS — BP 124/82 | Temp 97.9°F | Ht 66.5 in | Wt 245.4 lb

## 2024-12-16 DIAGNOSIS — Z860100 Personal history of colon polyps, unspecified: Secondary | ICD-10-CM

## 2024-12-16 DIAGNOSIS — E538 Deficiency of other specified B group vitamins: Secondary | ICD-10-CM | POA: Diagnosis not present

## 2024-12-16 DIAGNOSIS — Z0000 Encounter for general adult medical examination without abnormal findings: Secondary | ICD-10-CM

## 2024-12-16 DIAGNOSIS — E559 Vitamin D deficiency, unspecified: Secondary | ICD-10-CM

## 2024-12-16 DIAGNOSIS — E876 Hypokalemia: Secondary | ICD-10-CM | POA: Diagnosis not present

## 2024-12-16 LAB — COMPREHENSIVE METABOLIC PANEL WITH GFR
ALT: 11 U/L (ref 3–35)
AST: 19 U/L (ref 5–37)
Albumin: 4.3 g/dL (ref 3.5–5.2)
Alkaline Phosphatase: 92 U/L (ref 39–117)
BUN: 13 mg/dL (ref 6–23)
CO2: 27 meq/L (ref 19–32)
Calcium: 9.7 mg/dL (ref 8.4–10.5)
Chloride: 104 meq/L (ref 96–112)
Creatinine, Ser: 0.89 mg/dL (ref 0.40–1.20)
GFR: 77.7 mL/min
Glucose, Bld: 84 mg/dL (ref 70–99)
Potassium: 3.5 meq/L (ref 3.5–5.1)
Sodium: 137 meq/L (ref 135–145)
Total Bilirubin: 0.5 mg/dL (ref 0.2–1.2)
Total Protein: 8.1 g/dL (ref 6.0–8.3)

## 2024-12-16 LAB — CBC WITH DIFFERENTIAL/PLATELET
Basophils Absolute: 0 K/uL (ref 0.0–0.1)
Basophils Relative: 0.9 % (ref 0.0–3.0)
Eosinophils Absolute: 0.1 K/uL (ref 0.0–0.7)
Eosinophils Relative: 1.9 % (ref 0.0–5.0)
HCT: 32.9 % — ABNORMAL LOW (ref 36.0–46.0)
Hemoglobin: 10.2 g/dL — ABNORMAL LOW (ref 12.0–15.0)
Lymphocytes Relative: 33.6 % (ref 12.0–46.0)
Lymphs Abs: 1.7 K/uL (ref 0.7–4.0)
MCHC: 31.1 g/dL (ref 30.0–36.0)
MCV: 74.7 fl — ABNORMAL LOW (ref 78.0–100.0)
Monocytes Absolute: 0.3 K/uL (ref 0.1–1.0)
Monocytes Relative: 5.8 % (ref 3.0–12.0)
Neutro Abs: 3 K/uL (ref 1.4–7.7)
Neutrophils Relative %: 57.8 % (ref 43.0–77.0)
Platelets: 354 K/uL (ref 150.0–400.0)
RBC: 4.4 Mil/uL (ref 3.87–5.11)
RDW: 17.3 % — ABNORMAL HIGH (ref 11.5–15.5)
WBC: 5.2 K/uL (ref 4.0–10.5)

## 2024-12-16 LAB — LIPID PANEL
Cholesterol: 193 mg/dL (ref 28–200)
HDL: 78.5 mg/dL
LDL Cholesterol: 106 mg/dL — ABNORMAL HIGH (ref 10–99)
NonHDL: 114.96
Total CHOL/HDL Ratio: 2
Triglycerides: 47 mg/dL (ref 10.0–149.0)
VLDL: 9.4 mg/dL (ref 0.0–40.0)

## 2024-12-16 LAB — T4, FREE: Free T4: 0.74 ng/dL (ref 0.60–1.60)

## 2024-12-16 LAB — URINALYSIS
Bilirubin Urine: NEGATIVE
Ketones, ur: NEGATIVE
Leukocytes,Ua: NEGATIVE
Nitrite: NEGATIVE
Specific Gravity, Urine: 1.01 (ref 1.000–1.030)
Total Protein, Urine: NEGATIVE
Urine Glucose: NEGATIVE
Urobilinogen, UA: 0.2 (ref 0.0–1.0)
pH: 7.5 (ref 5.0–8.0)

## 2024-12-16 LAB — TSH: TSH: 2.09 u[IU]/mL (ref 0.35–5.50)

## 2024-12-16 LAB — VITAMIN B12: Vitamin B-12: 473 pg/mL (ref 211–911)

## 2024-12-16 LAB — VITAMIN D 25 HYDROXY (VIT D DEFICIENCY, FRACTURES): VITD: 22.11 ng/mL — ABNORMAL LOW (ref 30.00–100.00)

## 2024-12-16 NOTE — Progress Notes (Signed)
 "  Subjective:  Patient ID: Kim Barnes, female    DOB: 25-Aug-1976  Age: 49 y.o. MRN: [id number]  CC: Annual Exam (Annual Exam)   HPI Kim Barnes presents for a well exam  Outpatient Medications Prior to Visit  Medication Sig Dispense Refill   albuterol  (PROAIR  HFA) 108 (90 Base) MCG/ACT inhaler Inhale 2 puffs into the lungs every 4 (four) hours as needed for wheezing or shortness of breath. 18 g 3   Albuterol -Budesonide  (AIRSUPRA ) 90-80 MCG/ACT AERO INHALE 2 PUFFS INTO LUNGS EVERY 4 HOURS AS NEEDED 10.7 g 5   amLODipine  (NORVASC ) 5 MG tablet Take 1 tablet (5 mg total) by mouth daily. 90 tablet 3   B Complex-Folic Acid (B COMPLEX PLUS) TABS Take 1 tablet by mouth daily. 100 tablet 3   Cholecalciferol (VITAMIN D3) 50 MCG (2000 UT) capsule Take 2 capsules (4,000 Units total) by mouth daily. 180 capsule 3   levothyroxine  (SYNTHROID ) 25 MCG tablet TAKE 1 TABLET BY MOUTH DAILY 30 tablet 5   montelukast  (SINGULAIR ) 10 MG tablet TAKE 1 TABLET BY MOUTH AT BEDTIME 30 tablet 5   phentermine 30 MG capsule Take 30 mg by mouth daily.     potassium chloride  (KLOR-CON  10) 10 MEQ tablet Take 1 tablet (10 mEq total) by mouth 3 (three) times daily. 90 tablet 11   topiramate (TOPAMAX) 50 MG tablet Take 50 mg by mouth daily.     No facility-administered medications prior to visit.    ROS: Review of Systems  Constitutional:  Negative for activity change, appetite change, chills, fatigue and unexpected weight change.  HENT:  Negative for congestion, mouth sores and sinus pressure.   Eyes:  Negative for visual disturbance.  Respiratory:  Negative for cough and chest tightness.   Gastrointestinal:  Negative for abdominal pain and nausea.  Genitourinary:  Negative for difficulty urinating, frequency and vaginal pain.  Musculoskeletal:  Negative for back pain and gait problem.  Skin:  Negative for pallor and rash.  Neurological:  Negative for dizziness, tremors, weakness, numbness and headaches.   Psychiatric/Behavioral:  Negative for confusion, sleep disturbance and suicidal ideas.     Objective:  BP 124/82   Temp 97.9 F (36.6 C)   Ht 5' 6.5 (1.689 m)   Wt 245 lb 6.4 oz (111.3 kg)   BMI 39.02 kg/m   BP Readings from Last 3 Encounters:  12/16/24 124/82  11/11/24 116/64  10/06/24 (!) 144/82    Wt Readings from Last 3 Encounters:  12/16/24 245 lb 6.4 oz (111.3 kg)  11/11/24 239 lb (108.4 kg)  10/06/24 239 lb 9.6 oz (108.7 kg)    Physical Exam Constitutional:      General: She is not in acute distress.    Appearance: She is well-developed. She is obese.  HENT:     Head: Normocephalic.     Right Ear: External ear normal.     Left Ear: External ear normal.     Nose: Nose normal.  Eyes:     General:        Right eye: No discharge.        Left eye: No discharge.     Conjunctiva/sclera: Conjunctivae normal.     Pupils: Pupils are equal, round, and reactive to light.  Neck:     Thyroid : No thyromegaly.     Vascular: No JVD.     Trachea: No tracheal deviation.  Cardiovascular:     Rate and Rhythm: Normal rate and regular rhythm.  Heart sounds: Normal heart sounds.  Pulmonary:     Effort: No respiratory distress.     Breath sounds: No stridor. No wheezing.  Abdominal:     General: Bowel sounds are normal. There is no distension.     Palpations: Abdomen is soft. There is no mass.     Tenderness: There is no abdominal tenderness. There is no guarding or rebound.  Musculoskeletal:        General: No tenderness.     Cervical back: Normal range of motion and neck supple. No rigidity.  Lymphadenopathy:     Cervical: No cervical adenopathy.  Skin:    Findings: No erythema or rash.  Neurological:     Cranial Nerves: No cranial nerve deficit.     Motor: No abnormal muscle tone.     Coordination: Coordination normal.     Deep Tendon Reflexes: Reflexes normal.  Psychiatric:        Behavior: Behavior normal.        Thought Content: Thought content normal.         Judgment: Judgment normal.     Lab Results  Component Value Date   WBC 4.6 09/18/2023   HGB 13.1 09/18/2023   HCT 39.9 09/18/2023   PLT 302.0 09/18/2023   GLUCOSE 81 06/15/2024   CHOL 181 03/30/2024   TRIG 44.0 03/30/2024   HDL 68.90 03/30/2024   LDLDIRECT 149.4 05/24/2010   LDLCALC 103 (H) 03/30/2024   ALT 12 06/15/2024   AST 19 06/15/2024   NA 138 06/15/2024   K 3.2 (L) 06/15/2024   CL 102 06/15/2024   CREATININE 0.84 06/15/2024   BUN 11 06/15/2024   CO2 29 06/15/2024   TSH 1.31 06/15/2024   INR 1.16 02/04/2017   HGBA1C 5.6 03/30/2024    No results found.  Assessment & Plan:   Problem List Items Addressed This Visit     Well adult exam - Primary    We discussed age appropriate health related issues, including available/recomended screening tests and vaccinations. Labs were ordered to be later reviewed . All questions were answered. We discussed one or more of the following - seat belt use, use of sunscreen/sun exposure exercise, fall risk reduction, second hand smoke exposure, firearm use and storage, seat belt use, a need for adhering to healthy diet and exercise. Labs were ordered.  All questions were answered. Colonoscopy in 2024 with 10 mm colon polyp.  Next due in 2027 with Dr. Shila         Vitamin D  deficiency   B12 deficiency   Hypokalemia   History of colonic polyps   Colonoscopy in 2024 with 10 mm colon polyp.  Next due in 2027 with Dr. Nandigam         No orders of the defined types were placed in this encounter.     Follow-up: Return in about 6 months (around 06/15/2025).  Marolyn Noel, MD "

## 2024-12-16 NOTE — Assessment & Plan Note (Signed)
 Colonoscopy in 2024 with 10 mm colon polyp.  Next due in 2027 with Dr. Nandigam

## 2024-12-16 NOTE — Assessment & Plan Note (Addendum)
" °  We discussed age appropriate health related issues, including available/recomended screening tests and vaccinations. Labs were ordered to be later reviewed . All questions were answered. We discussed one or more of the following - seat belt use, use of sunscreen/sun exposure exercise, fall risk reduction, second hand smoke exposure, firearm use and storage, seat belt use, a need for adhering to healthy diet and exercise. Labs were ordered.  All questions were answered. Colonoscopy in 2024 with 10 mm colon polyp.  Next due in 2027 with Dr. Nandigam    "

## 2024-12-27 ENCOUNTER — Ambulatory Visit: Payer: Self-pay | Admitting: Internal Medicine

## 2024-12-27 DIAGNOSIS — D508 Other iron deficiency anemias: Secondary | ICD-10-CM

## 2024-12-27 NOTE — Assessment & Plan Note (Signed)
 Relapsed.  Restart taking over-the-counter iron

## 2025-02-09 ENCOUNTER — Ambulatory Visit: Admitting: Internal Medicine

## 2025-11-15 ENCOUNTER — Ambulatory Visit: Admitting: Obstetrics and Gynecology
